# Patient Record
Sex: Male | Born: 1945 | Race: Black or African American | Hispanic: No | Marital: Single | Smoking: Former smoker
Health system: Southern US, Community
[De-identification: ages and names within clinical notes are randomized; demographics above are authoritative.]

## PROBLEM LIST (undated history)

## (undated) DIAGNOSIS — E119 Type 2 diabetes mellitus without complications: Secondary | ICD-10-CM

## (undated) DIAGNOSIS — M1990 Unspecified osteoarthritis, unspecified site: Secondary | ICD-10-CM

## (undated) DIAGNOSIS — Z8719 Personal history of other diseases of the digestive system: Secondary | ICD-10-CM

## (undated) DIAGNOSIS — W3400XA Accidental discharge from unspecified firearms or gun, initial encounter: Secondary | ICD-10-CM

## (undated) DIAGNOSIS — N411 Chronic prostatitis: Secondary | ICD-10-CM

## (undated) DIAGNOSIS — I10 Essential (primary) hypertension: Secondary | ICD-10-CM

## (undated) DIAGNOSIS — K5750 Diverticulosis of both small and large intestine without perforation or abscess without bleeding: Secondary | ICD-10-CM

## (undated) DIAGNOSIS — K219 Gastro-esophageal reflux disease without esophagitis: Secondary | ICD-10-CM

## (undated) DIAGNOSIS — A048 Other specified bacterial intestinal infections: Secondary | ICD-10-CM

## (undated) HISTORY — PX: OTHER SURGICAL HISTORY: SHX169

## (undated) HISTORY — DX: Essential (primary) hypertension: I10

## (undated) HISTORY — PX: BACK SURGERY: SHX140

## (undated) HISTORY — DX: Accidental discharge from unspecified firearms or gun, initial encounter: W34.00XA

## (undated) HISTORY — DX: Chronic prostatitis: N41.1

## (undated) HISTORY — DX: Diverticulosis of both small and large intestine without perforation or abscess without bleeding: K57.50

## (undated) HISTORY — PX: TONSILLECTOMY: SUR1361

## (undated) HISTORY — DX: Other specified bacterial intestinal infections: A04.8

## (undated) HISTORY — PX: LAMINECTOMY: SHX219

## (undated) HISTORY — DX: Type 2 diabetes mellitus without complications: E11.9

## (undated) HISTORY — DX: Unspecified osteoarthritis, unspecified site: M19.90

## (undated) HISTORY — PX: CHOLECYSTECTOMY OPEN: SUR202

## (undated) HISTORY — PX: HIATAL HERNIA REPAIR: SHX195

---
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
## 1998-03-10 ENCOUNTER — Ambulatory Visit (HOSPITAL_COMMUNITY): Admission: RE | Admit: 1998-03-10 | Discharge: 1998-03-10 | Payer: Self-pay | Admitting: Neurosurgery

## 1998-03-22 ENCOUNTER — Encounter: Admission: RE | Admit: 1998-03-22 | Discharge: 1998-03-22 | Payer: Self-pay | Admitting: Family Medicine

## 1998-05-05 ENCOUNTER — Encounter: Admission: RE | Admit: 1998-05-05 | Discharge: 1998-05-05 | Payer: Self-pay | Admitting: Family Medicine

## 1998-05-06 ENCOUNTER — Emergency Department (HOSPITAL_COMMUNITY): Admission: EM | Admit: 1998-05-06 | Discharge: 1998-05-06 | Payer: Self-pay | Admitting: Emergency Medicine

## 1998-05-19 ENCOUNTER — Encounter: Admission: RE | Admit: 1998-05-19 | Discharge: 1998-05-19 | Payer: Self-pay | Admitting: Family Medicine

## 1998-06-16 ENCOUNTER — Encounter: Admission: RE | Admit: 1998-06-16 | Discharge: 1998-06-16 | Payer: Self-pay | Admitting: Family Medicine

## 1998-06-30 ENCOUNTER — Encounter: Admission: RE | Admit: 1998-06-30 | Discharge: 1998-06-30 | Payer: Self-pay | Admitting: Family Medicine

## 1998-07-19 ENCOUNTER — Encounter: Admission: RE | Admit: 1998-07-19 | Discharge: 1998-07-19 | Payer: Self-pay | Admitting: Family Medicine

## 1998-08-24 ENCOUNTER — Encounter: Admission: RE | Admit: 1998-08-24 | Discharge: 1998-08-24 | Payer: Self-pay | Admitting: Family Medicine

## 1998-08-29 ENCOUNTER — Encounter: Admission: RE | Admit: 1998-08-29 | Discharge: 1998-08-29 | Payer: Self-pay | Admitting: Family Medicine

## 1998-09-25 ENCOUNTER — Emergency Department (HOSPITAL_COMMUNITY): Admission: EM | Admit: 1998-09-25 | Discharge: 1998-09-25 | Payer: Self-pay | Admitting: *Deleted

## 1998-09-29 ENCOUNTER — Encounter: Admission: RE | Admit: 1998-09-29 | Discharge: 1998-09-29 | Payer: Self-pay | Admitting: Family Medicine

## 1998-10-25 ENCOUNTER — Encounter: Admission: RE | Admit: 1998-10-25 | Discharge: 1998-10-25 | Payer: Self-pay | Admitting: Sports Medicine

## 1998-10-28 ENCOUNTER — Encounter: Admission: RE | Admit: 1998-10-28 | Discharge: 1998-10-28 | Payer: Self-pay | Admitting: Family Medicine

## 1998-12-06 ENCOUNTER — Ambulatory Visit (HOSPITAL_COMMUNITY): Admission: RE | Admit: 1998-12-06 | Discharge: 1998-12-06 | Payer: Self-pay | Admitting: *Deleted

## 1999-01-02 ENCOUNTER — Encounter: Admission: RE | Admit: 1999-01-02 | Discharge: 1999-01-02 | Payer: Self-pay | Admitting: Family Medicine

## 1999-01-02 ENCOUNTER — Ambulatory Visit (HOSPITAL_COMMUNITY): Admission: RE | Admit: 1999-01-02 | Discharge: 1999-01-02 | Payer: Self-pay | Admitting: *Deleted

## 1999-01-02 ENCOUNTER — Ambulatory Visit (HOSPITAL_COMMUNITY): Admission: RE | Admit: 1999-01-02 | Discharge: 1999-01-02 | Payer: Self-pay | Admitting: Family Medicine

## 1999-01-03 ENCOUNTER — Emergency Department (HOSPITAL_COMMUNITY): Admission: EM | Admit: 1999-01-03 | Discharge: 1999-01-03 | Payer: Self-pay | Admitting: Emergency Medicine

## 1999-02-28 ENCOUNTER — Encounter: Payer: Self-pay | Admitting: Neurosurgery

## 1999-02-28 ENCOUNTER — Ambulatory Visit (HOSPITAL_COMMUNITY): Admission: RE | Admit: 1999-02-28 | Discharge: 1999-02-28 | Payer: Self-pay | Admitting: Neurosurgery

## 1999-03-07 ENCOUNTER — Emergency Department (HOSPITAL_COMMUNITY): Admission: EM | Admit: 1999-03-07 | Discharge: 1999-03-07 | Payer: Self-pay | Admitting: Emergency Medicine

## 1999-03-07 ENCOUNTER — Encounter: Payer: Self-pay | Admitting: Emergency Medicine

## 1999-03-16 ENCOUNTER — Encounter: Admission: RE | Admit: 1999-03-16 | Discharge: 1999-03-16 | Payer: Self-pay | Admitting: Family Medicine

## 1999-04-03 ENCOUNTER — Encounter: Admission: RE | Admit: 1999-04-03 | Discharge: 1999-04-03 | Payer: Self-pay | Admitting: Family Medicine

## 1999-04-05 ENCOUNTER — Encounter: Admission: RE | Admit: 1999-04-05 | Discharge: 1999-04-05 | Payer: Self-pay | Admitting: Family Medicine

## 1999-04-06 ENCOUNTER — Encounter: Payer: Self-pay | Admitting: Emergency Medicine

## 1999-04-06 ENCOUNTER — Emergency Department (HOSPITAL_COMMUNITY): Admission: EM | Admit: 1999-04-06 | Discharge: 1999-04-06 | Payer: Self-pay | Admitting: Psychologist

## 1999-04-07 ENCOUNTER — Ambulatory Visit (HOSPITAL_COMMUNITY): Admission: RE | Admit: 1999-04-07 | Discharge: 1999-04-07 | Payer: Self-pay | Admitting: Emergency Medicine

## 1999-04-07 ENCOUNTER — Encounter: Payer: Self-pay | Admitting: Emergency Medicine

## 1999-04-19 ENCOUNTER — Encounter: Payer: Self-pay | Admitting: Neurosurgery

## 1999-04-21 ENCOUNTER — Inpatient Hospital Stay (HOSPITAL_COMMUNITY): Admission: RE | Admit: 1999-04-21 | Discharge: 1999-04-26 | Payer: Self-pay | Admitting: Neurosurgery

## 1999-04-21 ENCOUNTER — Encounter: Payer: Self-pay | Admitting: Neurosurgery

## 1999-05-29 ENCOUNTER — Encounter: Admission: RE | Admit: 1999-05-29 | Discharge: 1999-07-10 | Payer: Self-pay | Admitting: Neurosurgery

## 1999-06-15 ENCOUNTER — Encounter: Admission: RE | Admit: 1999-06-15 | Discharge: 1999-07-10 | Payer: Self-pay | Admitting: Orthopedic Surgery

## 1999-07-12 ENCOUNTER — Encounter: Admission: RE | Admit: 1999-07-12 | Discharge: 1999-10-10 | Payer: Self-pay | Admitting: Orthopedic Surgery

## 1999-07-12 ENCOUNTER — Encounter: Admission: RE | Admit: 1999-07-12 | Discharge: 1999-10-10 | Payer: Self-pay | Admitting: Neurosurgery

## 1999-07-21 ENCOUNTER — Encounter: Admission: RE | Admit: 1999-07-21 | Discharge: 1999-07-21 | Payer: Self-pay | Admitting: Family Medicine

## 1999-08-03 ENCOUNTER — Encounter: Payer: Self-pay | Admitting: Neurosurgery

## 1999-08-03 ENCOUNTER — Ambulatory Visit (HOSPITAL_COMMUNITY): Admission: RE | Admit: 1999-08-03 | Discharge: 1999-08-03 | Payer: Self-pay | Admitting: Neurosurgery

## 1999-08-11 ENCOUNTER — Encounter: Admission: RE | Admit: 1999-08-11 | Discharge: 1999-08-11 | Payer: Self-pay | Admitting: Family Medicine

## 1999-09-06 ENCOUNTER — Encounter: Admission: RE | Admit: 1999-09-06 | Discharge: 1999-09-06 | Payer: Self-pay | Admitting: Family Medicine

## 1999-09-18 ENCOUNTER — Encounter: Admission: RE | Admit: 1999-09-18 | Discharge: 1999-09-18 | Payer: Self-pay | Admitting: Family Medicine

## 1999-09-23 ENCOUNTER — Emergency Department (HOSPITAL_COMMUNITY): Admission: EM | Admit: 1999-09-23 | Discharge: 1999-09-23 | Payer: Self-pay | Admitting: Emergency Medicine

## 1999-09-25 ENCOUNTER — Encounter: Admission: RE | Admit: 1999-09-25 | Discharge: 1999-09-25 | Payer: Self-pay | Admitting: Family Medicine

## 1999-10-12 ENCOUNTER — Encounter: Admission: RE | Admit: 1999-10-12 | Discharge: 1999-10-12 | Payer: Self-pay | Admitting: Family Medicine

## 1999-11-15 ENCOUNTER — Encounter: Admission: RE | Admit: 1999-11-15 | Discharge: 1999-11-15 | Payer: Self-pay | Admitting: Family Medicine

## 1999-11-29 ENCOUNTER — Encounter: Admission: RE | Admit: 1999-11-29 | Discharge: 1999-11-29 | Payer: Self-pay | Admitting: Family Medicine

## 1999-12-18 ENCOUNTER — Encounter: Admission: RE | Admit: 1999-12-18 | Discharge: 1999-12-18 | Payer: Self-pay | Admitting: Family Medicine

## 1999-12-27 ENCOUNTER — Encounter: Admission: RE | Admit: 1999-12-27 | Discharge: 1999-12-27 | Payer: Self-pay | Admitting: Family Medicine

## 2000-01-10 ENCOUNTER — Ambulatory Visit (HOSPITAL_COMMUNITY): Admission: RE | Admit: 2000-01-10 | Discharge: 2000-01-10 | Payer: Self-pay

## 2000-01-10 ENCOUNTER — Encounter: Admission: RE | Admit: 2000-01-10 | Discharge: 2000-01-10 | Payer: Self-pay | Admitting: Family Medicine

## 2000-03-07 ENCOUNTER — Encounter: Admission: RE | Admit: 2000-03-07 | Discharge: 2000-03-07 | Payer: Self-pay | Admitting: Family Medicine

## 2000-04-03 ENCOUNTER — Encounter: Admission: RE | Admit: 2000-04-03 | Discharge: 2000-04-03 | Payer: Self-pay | Admitting: Family Medicine

## 2000-05-26 ENCOUNTER — Emergency Department (HOSPITAL_COMMUNITY): Admission: EM | Admit: 2000-05-26 | Discharge: 2000-05-27 | Payer: Self-pay | Admitting: Emergency Medicine

## 2000-06-10 ENCOUNTER — Encounter: Admission: RE | Admit: 2000-06-10 | Discharge: 2000-06-10 | Payer: Self-pay | Admitting: Family Medicine

## 2000-06-11 ENCOUNTER — Encounter: Admission: RE | Admit: 2000-06-11 | Discharge: 2000-06-11 | Payer: Self-pay

## 2000-07-12 ENCOUNTER — Encounter: Admission: RE | Admit: 2000-07-12 | Discharge: 2000-08-09 | Payer: Self-pay | Admitting: Orthopedic Surgery

## 2000-09-26 ENCOUNTER — Encounter: Admission: RE | Admit: 2000-09-26 | Discharge: 2000-09-26 | Payer: Self-pay | Admitting: Family Medicine

## 2000-10-03 ENCOUNTER — Encounter: Payer: Self-pay | Admitting: Emergency Medicine

## 2000-10-03 ENCOUNTER — Emergency Department (HOSPITAL_COMMUNITY): Admission: EM | Admit: 2000-10-03 | Discharge: 2000-10-06 | Payer: Self-pay | Admitting: Emergency Medicine

## 2000-10-14 ENCOUNTER — Encounter: Admission: RE | Admit: 2000-10-14 | Discharge: 2000-10-14 | Payer: Self-pay | Admitting: Family Medicine

## 2000-11-29 ENCOUNTER — Encounter: Payer: Self-pay | Admitting: Orthopedic Surgery

## 2000-11-29 ENCOUNTER — Ambulatory Visit (HOSPITAL_COMMUNITY): Admission: AD | Admit: 2000-11-29 | Discharge: 2000-11-29 | Payer: Self-pay | Admitting: Orthopedic Surgery

## 2001-01-21 ENCOUNTER — Emergency Department (HOSPITAL_COMMUNITY): Admission: EM | Admit: 2001-01-21 | Discharge: 2001-01-21 | Payer: Self-pay

## 2001-03-04 ENCOUNTER — Emergency Department (HOSPITAL_COMMUNITY): Admission: EM | Admit: 2001-03-04 | Discharge: 2001-03-04 | Payer: Self-pay | Admitting: *Deleted

## 2001-03-24 ENCOUNTER — Ambulatory Visit (HOSPITAL_BASED_OUTPATIENT_CLINIC_OR_DEPARTMENT_OTHER): Admission: RE | Admit: 2001-03-24 | Discharge: 2001-03-24 | Payer: Self-pay | Admitting: Orthopedic Surgery

## 2001-04-11 ENCOUNTER — Encounter: Admission: RE | Admit: 2001-04-11 | Discharge: 2001-05-01 | Payer: Self-pay | Admitting: Orthopedic Surgery

## 2001-06-04 ENCOUNTER — Encounter: Admission: RE | Admit: 2001-06-04 | Discharge: 2001-07-16 | Payer: Self-pay | Admitting: Orthopedic Surgery

## 2001-06-19 ENCOUNTER — Encounter: Admission: RE | Admit: 2001-06-19 | Discharge: 2001-06-19 | Payer: Self-pay | Admitting: Family Medicine

## 2001-06-26 ENCOUNTER — Encounter: Payer: Self-pay | Admitting: Orthopedic Surgery

## 2001-06-26 ENCOUNTER — Ambulatory Visit (HOSPITAL_COMMUNITY): Admission: RE | Admit: 2001-06-26 | Discharge: 2001-06-26 | Payer: Self-pay | Admitting: Orthopedic Surgery

## 2001-07-14 ENCOUNTER — Emergency Department (HOSPITAL_COMMUNITY): Admission: EM | Admit: 2001-07-14 | Discharge: 2001-07-14 | Payer: Self-pay | Admitting: Internal Medicine

## 2001-07-15 ENCOUNTER — Encounter: Admission: RE | Admit: 2001-07-15 | Discharge: 2001-07-15 | Payer: Self-pay | Admitting: Family Medicine

## 2001-08-06 ENCOUNTER — Encounter: Admission: RE | Admit: 2001-08-06 | Discharge: 2001-08-06 | Payer: Self-pay | Admitting: Family Medicine

## 2001-08-15 ENCOUNTER — Encounter: Payer: Self-pay | Admitting: Sports Medicine

## 2001-08-15 ENCOUNTER — Encounter: Admission: RE | Admit: 2001-08-15 | Discharge: 2001-08-15 | Payer: Self-pay | Admitting: Sports Medicine

## 2001-08-19 ENCOUNTER — Encounter: Admission: RE | Admit: 2001-08-19 | Discharge: 2001-08-19 | Payer: Self-pay | Admitting: Sports Medicine

## 2001-09-05 ENCOUNTER — Encounter: Payer: Self-pay | Admitting: Surgery

## 2001-09-12 ENCOUNTER — Inpatient Hospital Stay (HOSPITAL_COMMUNITY): Admission: RE | Admit: 2001-09-12 | Discharge: 2001-09-16 | Payer: Self-pay | Admitting: Surgery

## 2001-09-12 ENCOUNTER — Encounter (INDEPENDENT_AMBULATORY_CARE_PROVIDER_SITE_OTHER): Payer: Self-pay | Admitting: *Deleted

## 2001-10-08 ENCOUNTER — Encounter: Admission: RE | Admit: 2001-10-08 | Discharge: 2001-10-08 | Payer: Self-pay | Admitting: Family Medicine

## 2001-12-25 ENCOUNTER — Encounter: Admission: RE | Admit: 2001-12-25 | Discharge: 2001-12-25 | Payer: Self-pay | Admitting: Family Medicine

## 2002-01-17 ENCOUNTER — Emergency Department (HOSPITAL_COMMUNITY): Admission: EM | Admit: 2002-01-17 | Discharge: 2002-01-17 | Payer: Self-pay | Admitting: Emergency Medicine

## 2002-02-13 ENCOUNTER — Ambulatory Visit (HOSPITAL_COMMUNITY): Admission: RE | Admit: 2002-02-13 | Discharge: 2002-02-13 | Payer: Self-pay | Admitting: *Deleted

## 2002-02-17 ENCOUNTER — Encounter: Admission: RE | Admit: 2002-02-17 | Discharge: 2002-02-17 | Payer: Self-pay | Admitting: Family Medicine

## 2002-03-19 ENCOUNTER — Encounter: Admission: RE | Admit: 2002-03-19 | Discharge: 2002-03-19 | Payer: Self-pay | Admitting: Family Medicine

## 2002-04-20 ENCOUNTER — Encounter: Admission: RE | Admit: 2002-04-20 | Discharge: 2002-04-20 | Payer: Self-pay | Admitting: Family Medicine

## 2002-05-01 ENCOUNTER — Encounter: Admission: RE | Admit: 2002-05-01 | Discharge: 2002-05-01 | Payer: Self-pay | Admitting: Family Medicine

## 2002-08-19 ENCOUNTER — Encounter: Payer: Self-pay | Admitting: Orthopedic Surgery

## 2002-08-19 ENCOUNTER — Ambulatory Visit (HOSPITAL_COMMUNITY): Admission: RE | Admit: 2002-08-19 | Discharge: 2002-08-19 | Payer: Self-pay | Admitting: Orthopedic Surgery

## 2002-11-26 ENCOUNTER — Encounter: Admission: RE | Admit: 2002-11-26 | Discharge: 2002-11-26 | Payer: Self-pay | Admitting: Family Medicine

## 2003-02-02 ENCOUNTER — Emergency Department (HOSPITAL_COMMUNITY): Admission: EM | Admit: 2003-02-02 | Discharge: 2003-02-02 | Payer: Self-pay | Admitting: Emergency Medicine

## 2003-03-03 ENCOUNTER — Emergency Department (HOSPITAL_COMMUNITY): Admission: EM | Admit: 2003-03-03 | Discharge: 2003-03-03 | Payer: Self-pay | Admitting: Emergency Medicine

## 2003-05-20 ENCOUNTER — Emergency Department (HOSPITAL_COMMUNITY): Admission: EM | Admit: 2003-05-20 | Discharge: 2003-05-20 | Payer: Self-pay | Admitting: Emergency Medicine

## 2003-07-13 ENCOUNTER — Encounter: Admission: RE | Admit: 2003-07-13 | Discharge: 2003-07-13 | Payer: Self-pay | Admitting: Sports Medicine

## 2003-07-14 ENCOUNTER — Encounter: Admission: RE | Admit: 2003-07-14 | Discharge: 2003-07-14 | Payer: Self-pay | Admitting: Family Medicine

## 2003-07-30 ENCOUNTER — Emergency Department (HOSPITAL_COMMUNITY): Admission: EM | Admit: 2003-07-30 | Discharge: 2003-07-30 | Payer: Self-pay | Admitting: *Deleted

## 2003-08-11 ENCOUNTER — Ambulatory Visit (HOSPITAL_BASED_OUTPATIENT_CLINIC_OR_DEPARTMENT_OTHER): Admission: RE | Admit: 2003-08-11 | Discharge: 2003-08-11 | Payer: Self-pay | Admitting: Orthopedic Surgery

## 2003-11-22 ENCOUNTER — Emergency Department (HOSPITAL_COMMUNITY): Admission: EM | Admit: 2003-11-22 | Discharge: 2003-11-22 | Payer: Self-pay | Admitting: Emergency Medicine

## 2004-01-13 ENCOUNTER — Encounter: Admission: RE | Admit: 2004-01-13 | Discharge: 2004-01-13 | Payer: Self-pay | Admitting: *Deleted

## 2004-03-22 ENCOUNTER — Emergency Department (HOSPITAL_COMMUNITY): Admission: EM | Admit: 2004-03-22 | Discharge: 2004-03-22 | Payer: Self-pay | Admitting: Emergency Medicine

## 2004-04-14 ENCOUNTER — Encounter: Admission: RE | Admit: 2004-04-14 | Discharge: 2004-04-14 | Payer: Self-pay | Admitting: *Deleted

## 2004-05-06 ENCOUNTER — Emergency Department (HOSPITAL_COMMUNITY): Admission: EM | Admit: 2004-05-06 | Discharge: 2004-05-06 | Payer: Self-pay | Admitting: Emergency Medicine

## 2004-09-13 ENCOUNTER — Emergency Department (HOSPITAL_COMMUNITY): Admission: EM | Admit: 2004-09-13 | Discharge: 2004-09-13 | Payer: Self-pay | Admitting: Emergency Medicine

## 2004-09-18 ENCOUNTER — Ambulatory Visit: Payer: Self-pay | Admitting: Sports Medicine

## 2004-10-26 ENCOUNTER — Ambulatory Visit: Payer: Self-pay | Admitting: Sports Medicine

## 2004-12-14 ENCOUNTER — Emergency Department (HOSPITAL_COMMUNITY): Admission: EM | Admit: 2004-12-14 | Discharge: 2004-12-14 | Payer: Self-pay | Admitting: Emergency Medicine

## 2005-01-02 ENCOUNTER — Emergency Department (HOSPITAL_COMMUNITY): Admission: EM | Admit: 2005-01-02 | Discharge: 2005-01-02 | Payer: Self-pay | Admitting: Emergency Medicine

## 2005-01-24 ENCOUNTER — Emergency Department (HOSPITAL_COMMUNITY): Admission: EM | Admit: 2005-01-24 | Discharge: 2005-01-24 | Payer: Self-pay | Admitting: Emergency Medicine

## 2005-03-06 ENCOUNTER — Emergency Department (HOSPITAL_COMMUNITY): Admission: EM | Admit: 2005-03-06 | Discharge: 2005-03-06 | Payer: Self-pay | Admitting: Emergency Medicine

## 2005-05-16 ENCOUNTER — Emergency Department (HOSPITAL_COMMUNITY): Admission: EM | Admit: 2005-05-16 | Discharge: 2005-05-16 | Payer: Self-pay | Admitting: Emergency Medicine

## 2005-07-23 ENCOUNTER — Emergency Department (HOSPITAL_COMMUNITY): Admission: EM | Admit: 2005-07-23 | Discharge: 2005-07-23 | Payer: Self-pay | Admitting: Emergency Medicine

## 2005-07-26 ENCOUNTER — Ambulatory Visit: Payer: Self-pay | Admitting: Family Medicine

## 2005-08-17 ENCOUNTER — Ambulatory Visit: Payer: Self-pay | Admitting: Family Medicine

## 2005-08-17 ENCOUNTER — Emergency Department (HOSPITAL_COMMUNITY): Admission: EM | Admit: 2005-08-17 | Discharge: 2005-08-17 | Payer: Self-pay | Admitting: Emergency Medicine

## 2005-09-27 ENCOUNTER — Ambulatory Visit: Payer: Self-pay | Admitting: Family Medicine

## 2006-01-09 ENCOUNTER — Emergency Department (HOSPITAL_COMMUNITY): Admission: EM | Admit: 2006-01-09 | Discharge: 2006-01-09 | Payer: Self-pay | Admitting: Emergency Medicine

## 2006-04-08 ENCOUNTER — Emergency Department (HOSPITAL_COMMUNITY): Admission: EM | Admit: 2006-04-08 | Discharge: 2006-04-08 | Payer: Self-pay | Admitting: Emergency Medicine

## 2006-04-23 ENCOUNTER — Ambulatory Visit (HOSPITAL_COMMUNITY): Admission: RE | Admit: 2006-04-23 | Discharge: 2006-04-23 | Payer: Self-pay | Admitting: Gastroenterology

## 2006-05-14 ENCOUNTER — Emergency Department (HOSPITAL_COMMUNITY): Admission: EM | Admit: 2006-05-14 | Discharge: 2006-05-14 | Payer: Self-pay | Admitting: Emergency Medicine

## 2006-06-26 ENCOUNTER — Ambulatory Visit (HOSPITAL_COMMUNITY): Admission: RE | Admit: 2006-06-26 | Discharge: 2006-06-26 | Payer: Self-pay | Admitting: Gastroenterology

## 2006-09-02 ENCOUNTER — Ambulatory Visit (HOSPITAL_COMMUNITY): Admission: RE | Admit: 2006-09-02 | Discharge: 2006-09-02 | Payer: Self-pay | Admitting: Gastroenterology

## 2006-10-02 ENCOUNTER — Emergency Department (HOSPITAL_COMMUNITY): Admission: EM | Admit: 2006-10-02 | Discharge: 2006-10-02 | Payer: Self-pay | Admitting: Emergency Medicine

## 2006-10-16 ENCOUNTER — Ambulatory Visit: Payer: Self-pay | Admitting: Family Medicine

## 2007-02-06 DIAGNOSIS — F528 Other sexual dysfunction not due to a substance or known physiological condition: Secondary | ICD-10-CM | POA: Insufficient documentation

## 2007-02-06 DIAGNOSIS — Z716 Tobacco abuse counseling: Secondary | ICD-10-CM | POA: Insufficient documentation

## 2007-02-06 DIAGNOSIS — J309 Allergic rhinitis, unspecified: Secondary | ICD-10-CM | POA: Insufficient documentation

## 2007-02-06 DIAGNOSIS — I10 Essential (primary) hypertension: Secondary | ICD-10-CM | POA: Insufficient documentation

## 2007-02-06 DIAGNOSIS — E669 Obesity, unspecified: Secondary | ICD-10-CM | POA: Insufficient documentation

## 2007-02-06 DIAGNOSIS — M1990 Unspecified osteoarthritis, unspecified site: Secondary | ICD-10-CM | POA: Insufficient documentation

## 2007-02-06 DIAGNOSIS — F411 Generalized anxiety disorder: Secondary | ICD-10-CM | POA: Insufficient documentation

## 2007-02-06 DIAGNOSIS — N411 Chronic prostatitis: Secondary | ICD-10-CM | POA: Insufficient documentation

## 2007-02-06 DIAGNOSIS — N400 Enlarged prostate without lower urinary tract symptoms: Secondary | ICD-10-CM | POA: Insufficient documentation

## 2007-02-06 DIAGNOSIS — F339 Major depressive disorder, recurrent, unspecified: Secondary | ICD-10-CM | POA: Insufficient documentation

## 2007-03-10 ENCOUNTER — Emergency Department (HOSPITAL_COMMUNITY): Admission: EM | Admit: 2007-03-10 | Discharge: 2007-03-10 | Payer: Self-pay | Admitting: Emergency Medicine

## 2007-03-24 ENCOUNTER — Telehealth: Payer: Self-pay | Admitting: *Deleted

## 2007-03-26 ENCOUNTER — Ambulatory Visit: Payer: Self-pay | Admitting: Family Medicine

## 2007-03-26 ENCOUNTER — Encounter: Payer: Self-pay | Admitting: Family Medicine

## 2007-03-26 DIAGNOSIS — A5903 Trichomonal cystitis and urethritis: Secondary | ICD-10-CM | POA: Insufficient documentation

## 2007-03-26 LAB — CONVERTED CEMR LAB
Chlamydia, DNA Probe: NEGATIVE
GC Probe Amp, Genital: NEGATIVE

## 2007-04-03 ENCOUNTER — Ambulatory Visit: Payer: Self-pay | Admitting: Sports Medicine

## 2007-04-30 ENCOUNTER — Encounter: Payer: Self-pay | Admitting: *Deleted

## 2007-06-10 ENCOUNTER — Emergency Department (HOSPITAL_COMMUNITY): Admission: EM | Admit: 2007-06-10 | Discharge: 2007-06-10 | Payer: Self-pay | Admitting: Emergency Medicine

## 2007-07-19 ENCOUNTER — Emergency Department (HOSPITAL_COMMUNITY): Admission: EM | Admit: 2007-07-19 | Discharge: 2007-07-19 | Payer: Self-pay | Admitting: Emergency Medicine

## 2007-07-23 ENCOUNTER — Ambulatory Visit: Payer: Self-pay | Admitting: Family Medicine

## 2007-07-23 ENCOUNTER — Encounter (INDEPENDENT_AMBULATORY_CARE_PROVIDER_SITE_OTHER): Payer: Self-pay | Admitting: *Deleted

## 2007-07-23 DIAGNOSIS — K219 Gastro-esophageal reflux disease without esophagitis: Secondary | ICD-10-CM | POA: Insufficient documentation

## 2007-07-23 DIAGNOSIS — E119 Type 2 diabetes mellitus without complications: Secondary | ICD-10-CM | POA: Insufficient documentation

## 2007-07-23 LAB — CONVERTED CEMR LAB: Hgb A1c MFr Bld: 9.2 %

## 2007-07-24 ENCOUNTER — Telehealth: Payer: Self-pay | Admitting: *Deleted

## 2007-07-24 LAB — CONVERTED CEMR LAB
ALT: 53 units/L (ref 0–53)
BUN: 13 mg/dL (ref 6–23)
CO2: 26 meq/L (ref 19–32)
Calcium: 9.8 mg/dL (ref 8.4–10.5)
Chloride: 100 meq/L (ref 96–112)
Creatinine, Ser: 1.11 mg/dL (ref 0.40–1.50)
Direct LDL: 98 mg/dL
Glucose, Bld: 191 mg/dL — ABNORMAL HIGH (ref 70–99)
Total Bilirubin: 0.6 mg/dL (ref 0.3–1.2)

## 2007-08-05 ENCOUNTER — Encounter: Admission: RE | Admit: 2007-08-05 | Discharge: 2007-08-05 | Payer: Self-pay

## 2007-08-05 ENCOUNTER — Encounter (INDEPENDENT_AMBULATORY_CARE_PROVIDER_SITE_OTHER): Payer: Self-pay | Admitting: *Deleted

## 2007-08-14 ENCOUNTER — Encounter (INDEPENDENT_AMBULATORY_CARE_PROVIDER_SITE_OTHER): Payer: Self-pay | Admitting: *Deleted

## 2007-08-20 ENCOUNTER — Ambulatory Visit: Payer: Self-pay | Admitting: Family Medicine

## 2007-09-17 ENCOUNTER — Ambulatory Visit: Payer: Self-pay | Admitting: Family Medicine

## 2007-09-17 LAB — CONVERTED CEMR LAB: Hgb A1c MFr Bld: 6.9 %

## 2007-09-30 ENCOUNTER — Encounter (INDEPENDENT_AMBULATORY_CARE_PROVIDER_SITE_OTHER): Payer: Self-pay | Admitting: *Deleted

## 2007-10-13 ENCOUNTER — Encounter (INDEPENDENT_AMBULATORY_CARE_PROVIDER_SITE_OTHER): Payer: Self-pay | Admitting: *Deleted

## 2007-10-13 ENCOUNTER — Ambulatory Visit: Payer: Self-pay | Admitting: Family Medicine

## 2007-10-14 LAB — CONVERTED CEMR LAB
BUN: 15 mg/dL (ref 6–23)
Cholesterol: 149 mg/dL (ref 0–200)
HDL: 34 mg/dL — ABNORMAL LOW (ref >39)
Potassium: 4.8 meq/L (ref 3.5–5.3)
Triglycerides: 160 mg/dL — ABNORMAL HIGH (ref <150)
VLDL: 32 mg/dL (ref 0–40)

## 2007-10-20 ENCOUNTER — Ambulatory Visit: Payer: Self-pay | Admitting: Family Medicine

## 2007-10-20 DIAGNOSIS — E785 Hyperlipidemia, unspecified: Secondary | ICD-10-CM | POA: Insufficient documentation

## 2007-10-20 LAB — CONVERTED CEMR LAB
Bilirubin Urine: NEGATIVE
Ketones, urine, test strip: NEGATIVE
Nitrite: NEGATIVE
Protein, U semiquant: NEGATIVE
Urobilinogen, UA: 1

## 2007-10-21 ENCOUNTER — Encounter (INDEPENDENT_AMBULATORY_CARE_PROVIDER_SITE_OTHER): Payer: Self-pay | Admitting: *Deleted

## 2008-02-12 ENCOUNTER — Ambulatory Visit: Payer: Self-pay | Admitting: Family Medicine

## 2008-02-12 LAB — CONVERTED CEMR LAB: Hgb A1c MFr Bld: 6.7 %

## 2008-02-27 ENCOUNTER — Encounter: Payer: Self-pay | Admitting: *Deleted

## 2008-03-03 ENCOUNTER — Ambulatory Visit: Payer: Self-pay | Admitting: Family Medicine

## 2008-03-09 ENCOUNTER — Ambulatory Visit: Payer: Self-pay | Admitting: Family Medicine

## 2008-03-09 ENCOUNTER — Encounter (INDEPENDENT_AMBULATORY_CARE_PROVIDER_SITE_OTHER): Payer: Self-pay | Admitting: *Deleted

## 2008-03-09 LAB — CONVERTED CEMR LAB
Direct LDL: 92 mg/dL
Nitrite: NEGATIVE
Specific Gravity, Urine: 1.025
WBC Urine, dipstick: NEGATIVE

## 2008-03-10 ENCOUNTER — Encounter (INDEPENDENT_AMBULATORY_CARE_PROVIDER_SITE_OTHER): Payer: Self-pay | Admitting: *Deleted

## 2008-03-16 ENCOUNTER — Encounter (INDEPENDENT_AMBULATORY_CARE_PROVIDER_SITE_OTHER): Payer: Self-pay | Admitting: *Deleted

## 2008-03-16 ENCOUNTER — Ambulatory Visit: Payer: Self-pay | Admitting: Family Medicine

## 2008-03-16 DIAGNOSIS — R195 Other fecal abnormalities: Secondary | ICD-10-CM | POA: Insufficient documentation

## 2008-03-17 ENCOUNTER — Encounter (INDEPENDENT_AMBULATORY_CARE_PROVIDER_SITE_OTHER): Payer: Self-pay | Admitting: *Deleted

## 2008-03-17 LAB — CONVERTED CEMR LAB
ALT: 14 units/L (ref 0–53)
AST: 14 units/L (ref 0–37)
Albumin: 4.7 g/dL (ref 3.5–5.2)
BUN: 16 mg/dL (ref 6–23)
CO2: 25 meq/L (ref 19–32)
Calcium: 9.2 mg/dL (ref 8.4–10.5)
Chloride: 101 meq/L (ref 96–112)
Creatinine, Ser: 1.03 mg/dL (ref 0.40–1.50)
Hemoglobin: 16 g/dL (ref 13.0–17.0)
Lipase: 29 units/L (ref 0–75)
Lymphocytes Relative: 36 % (ref 12–46)
Monocytes Absolute: 0.4 10*3/uL (ref 0.1–1.0)
Monocytes Relative: 6 % (ref 3–12)
Neutro Abs: 2.5 10*3/uL (ref 1.7–7.7)
Neutrophils Relative %: 42 % — ABNORMAL LOW (ref 43–77)
Potassium: 4.6 meq/L (ref 3.5–5.3)
RBC: 5.53 M/uL (ref 4.22–5.81)
WBC: 6 10*3/uL (ref 4.0–10.5)

## 2008-03-25 ENCOUNTER — Encounter (INDEPENDENT_AMBULATORY_CARE_PROVIDER_SITE_OTHER): Payer: Self-pay | Admitting: *Deleted

## 2008-04-01 ENCOUNTER — Encounter (INDEPENDENT_AMBULATORY_CARE_PROVIDER_SITE_OTHER): Payer: Self-pay | Admitting: *Deleted

## 2008-04-05 ENCOUNTER — Encounter: Payer: Self-pay | Admitting: *Deleted

## 2008-05-15 ENCOUNTER — Emergency Department (HOSPITAL_COMMUNITY): Admission: EM | Admit: 2008-05-15 | Discharge: 2008-05-15 | Payer: Self-pay | Admitting: Emergency Medicine

## 2008-05-17 ENCOUNTER — Encounter (INDEPENDENT_AMBULATORY_CARE_PROVIDER_SITE_OTHER): Payer: Self-pay | Admitting: *Deleted

## 2008-06-21 ENCOUNTER — Telehealth: Payer: Self-pay | Admitting: *Deleted

## 2008-07-15 ENCOUNTER — Emergency Department (HOSPITAL_COMMUNITY): Admission: EM | Admit: 2008-07-15 | Discharge: 2008-07-15 | Payer: Self-pay | Admitting: *Deleted

## 2008-07-15 ENCOUNTER — Ambulatory Visit: Payer: Self-pay | Admitting: Family Medicine

## 2008-09-30 ENCOUNTER — Emergency Department (HOSPITAL_COMMUNITY): Admission: EM | Admit: 2008-09-30 | Discharge: 2008-09-30 | Payer: Self-pay | Admitting: Emergency Medicine

## 2008-11-15 ENCOUNTER — Ambulatory Visit: Payer: Self-pay | Admitting: Family Medicine

## 2008-11-15 ENCOUNTER — Encounter: Payer: Self-pay | Admitting: Family Medicine

## 2008-11-28 ENCOUNTER — Emergency Department (HOSPITAL_COMMUNITY): Admission: EM | Admit: 2008-11-28 | Discharge: 2008-11-28 | Payer: Self-pay | Admitting: Emergency Medicine

## 2008-12-27 ENCOUNTER — Ambulatory Visit: Payer: Self-pay | Admitting: Family Medicine

## 2008-12-27 LAB — CONVERTED CEMR LAB: Hgb A1c MFr Bld: 7.1 %

## 2009-03-04 ENCOUNTER — Emergency Department (HOSPITAL_COMMUNITY): Admission: EM | Admit: 2009-03-04 | Discharge: 2009-03-04 | Payer: Self-pay | Admitting: Emergency Medicine

## 2009-05-19 ENCOUNTER — Telehealth: Payer: Self-pay | Admitting: *Deleted

## 2009-06-06 ENCOUNTER — Ambulatory Visit: Payer: Self-pay | Admitting: Family Medicine

## 2009-06-06 ENCOUNTER — Encounter: Payer: Self-pay | Admitting: Family Medicine

## 2009-06-06 LAB — CONVERTED CEMR LAB: Hgb A1c MFr Bld: 7.2 %

## 2009-06-07 DIAGNOSIS — R946 Abnormal results of thyroid function studies: Secondary | ICD-10-CM | POA: Insufficient documentation

## 2009-06-07 LAB — CONVERTED CEMR LAB
ALT: 15 units/L (ref 0–53)
BUN: 12 mg/dL (ref 6–23)
CO2: 25 meq/L (ref 19–32)
Calcium: 10.3 mg/dL (ref 8.4–10.5)
Chloride: 101 meq/L (ref 96–112)
Creatinine, Ser: 1.09 mg/dL (ref 0.40–1.50)
TSH: 4.743 microintl units/mL — ABNORMAL HIGH (ref 0.350–4.500)
Total Bilirubin: 0.5 mg/dL (ref 0.3–1.2)

## 2009-06-08 ENCOUNTER — Encounter: Payer: Self-pay | Admitting: Family Medicine

## 2009-06-08 ENCOUNTER — Ambulatory Visit: Payer: Self-pay | Admitting: Family Medicine

## 2009-06-08 LAB — CONVERTED CEMR LAB
Free T4: 1.03 ng/dL (ref 0.80–1.80)
PSA: 1.27 ng/mL (ref 0.10–4.00)
T3, Free: 3.3 pg/mL (ref 2.3–4.2)

## 2009-06-09 ENCOUNTER — Encounter: Payer: Self-pay | Admitting: Family Medicine

## 2009-06-15 ENCOUNTER — Telehealth: Payer: Self-pay | Admitting: *Deleted

## 2009-08-05 ENCOUNTER — Encounter: Payer: Self-pay | Admitting: Family Medicine

## 2009-08-05 ENCOUNTER — Emergency Department (HOSPITAL_COMMUNITY): Admission: EM | Admit: 2009-08-05 | Discharge: 2009-08-05 | Payer: Self-pay | Admitting: Emergency Medicine

## 2009-08-05 ENCOUNTER — Ambulatory Visit: Payer: Self-pay | Admitting: Family Medicine

## 2009-08-31 ENCOUNTER — Emergency Department (HOSPITAL_COMMUNITY): Admission: EM | Admit: 2009-08-31 | Discharge: 2009-08-31 | Payer: Self-pay | Admitting: Emergency Medicine

## 2009-09-20 ENCOUNTER — Ambulatory Visit: Payer: Self-pay | Admitting: Family Medicine

## 2009-09-20 ENCOUNTER — Encounter: Payer: Self-pay | Admitting: Psychology

## 2009-09-20 DIAGNOSIS — R109 Unspecified abdominal pain: Secondary | ICD-10-CM | POA: Insufficient documentation

## 2010-01-11 ENCOUNTER — Ambulatory Visit: Payer: Self-pay | Admitting: Family Medicine

## 2010-02-27 ENCOUNTER — Emergency Department (HOSPITAL_COMMUNITY): Admission: EM | Admit: 2010-02-27 | Discharge: 2010-02-27 | Payer: Self-pay | Admitting: Emergency Medicine

## 2010-03-03 ENCOUNTER — Telehealth: Payer: Self-pay | Admitting: Family Medicine

## 2010-03-24 ENCOUNTER — Ambulatory Visit: Payer: Self-pay | Admitting: Family Medicine

## 2010-04-19 ENCOUNTER — Ambulatory Visit: Payer: Self-pay | Admitting: Family Medicine

## 2010-04-19 DIAGNOSIS — M25519 Pain in unspecified shoulder: Secondary | ICD-10-CM | POA: Insufficient documentation

## 2010-05-02 ENCOUNTER — Encounter: Admission: RE | Admit: 2010-05-02 | Discharge: 2010-06-26 | Payer: Self-pay | Admitting: Family Medicine

## 2010-05-23 ENCOUNTER — Ambulatory Visit: Payer: Self-pay | Admitting: Family Medicine

## 2010-06-09 ENCOUNTER — Encounter: Payer: Self-pay | Admitting: Family Medicine

## 2010-06-22 ENCOUNTER — Encounter: Payer: Self-pay | Admitting: Family Medicine

## 2010-07-21 ENCOUNTER — Ambulatory Visit: Payer: Self-pay | Admitting: Family Medicine

## 2010-07-21 ENCOUNTER — Encounter: Payer: Self-pay | Admitting: Family Medicine

## 2010-07-21 DIAGNOSIS — N400 Enlarged prostate without lower urinary tract symptoms: Secondary | ICD-10-CM | POA: Insufficient documentation

## 2010-07-21 LAB — CONVERTED CEMR LAB
ALT: 14 units/L (ref 0–53)
BUN: 11 mg/dL (ref 6–23)
CO2: 28 meq/L (ref 19–32)
Calcium: 9.9 mg/dL (ref 8.4–10.5)
Chloride: 101 meq/L (ref 96–112)
Cholesterol: 168 mg/dL (ref 0–200)
Creatinine, Ser: 0.94 mg/dL (ref 0.40–1.50)
Glucose, Bld: 134 mg/dL — ABNORMAL HIGH (ref 70–99)
HCT: 46.6 % (ref 39.0–52.0)
Hemoglobin: 16.1 g/dL (ref 13.0–17.0)
MCV: 85.2 fL (ref 78.0–100.0)
RBC: 5.47 M/uL (ref 4.22–5.81)
Total Bilirubin: 0.6 mg/dL (ref 0.3–1.2)
Total CHOL/HDL Ratio: 4.5
Triglycerides: 230 mg/dL — ABNORMAL HIGH (ref <150)
VLDL: 46 mg/dL — ABNORMAL HIGH (ref 0–40)
WBC: 5.1 10*3/uL (ref 4.0–10.5)

## 2010-07-24 ENCOUNTER — Encounter: Payer: Self-pay | Admitting: Family Medicine

## 2010-09-26 ENCOUNTER — Ambulatory Visit: Payer: Self-pay | Admitting: Family Medicine

## 2010-09-26 LAB — CONVERTED CEMR LAB: H Pylori IgG: POSITIVE

## 2010-11-14 ENCOUNTER — Ambulatory Visit: Payer: Self-pay | Admitting: Family Medicine

## 2010-11-14 ENCOUNTER — Encounter: Payer: Self-pay | Admitting: Family Medicine

## 2010-11-14 DIAGNOSIS — N529 Male erectile dysfunction, unspecified: Secondary | ICD-10-CM | POA: Insufficient documentation

## 2010-11-15 ENCOUNTER — Encounter: Payer: Self-pay | Admitting: Family Medicine

## 2010-11-24 ENCOUNTER — Emergency Department (HOSPITAL_COMMUNITY)
Admission: EM | Admit: 2010-11-24 | Discharge: 2010-11-24 | Payer: Self-pay | Source: Home / Self Care | Admitting: Emergency Medicine

## 2010-12-03 ENCOUNTER — Emergency Department (HOSPITAL_COMMUNITY)
Admission: EM | Admit: 2010-12-03 | Discharge: 2010-12-03 | Payer: Self-pay | Source: Home / Self Care | Admitting: Emergency Medicine

## 2010-12-05 ENCOUNTER — Ambulatory Visit: Payer: Self-pay | Admitting: Family Medicine

## 2010-12-05 ENCOUNTER — Telehealth: Payer: Self-pay | Admitting: Family Medicine

## 2011-01-11 NOTE — Miscellaneous (Signed)
Summary: form to continue PT  Clinical Lists Changes form to pcp to sign, allowing 4 more wks of PT for his shoulder.[place] Circle RN  June 09, 2010 2:33 PM

## 2011-01-11 NOTE — Assessment & Plan Note (Signed)
Summary: f/u htn/dm/gerd   Vital Signs:  Patient profile:   65 year old male Height:      72.5 inches Weight:      253 pounds BMI:     33.96 Temp:     98.5 degrees F oral Pulse rate:   82 / minute BP sitting:   168 / 110  (left arm) Cuff size:   large  Vitals Entered By: Tessie Fass CMA (November 14, 2010 1:57 PM) CC: F/U Is Patient Diabetic? Yes   Primary Alysandra Lobue:  Everrett Coombe DO  CC:  F/U.  History of Present Illness: Pt. here to f/u on 1.DMII- Does not check sugars that often at home.  Currently no problems with his current medication regimen.  States he tries to avoid foods that he knows make his sugar run high.  No episodes of low blood sugar or shakiness. 2.  Reflux- Pt. states he completed treatmen for H. Pylori.  Still having quite a bit of reflux, states it burns like fire when he is laying down.  No certain foods that he can narrow down that make condition worse.  Still doubling up on PPI daily.  Denies any blood in his stool or changes in bowel habits.  No associated nausea, vomiting. 3.Hypertension- BP not well controlled over the past few office visits.  Pt. states he has not made the change in BP medication that was prescribed to him last time.  No episodes of chest pain, shortness of breath, headache. 4.Impotence- Patient with difficulty achieving erection and also with decreased energy.  Would like to have his testosterone checked.  Does use viagra occasionally, which works.    Current Medications (verified): 1)  Cardura 2 Mg  Tabs (Doxazosin Mesylate) .Marland Kitchen.. 1 By Mouth Once Daily, For Bph 2)  Viagra 100 Mg Tabs (Sildenafil Citrate) .... Take One Tablet 30 Min Prior To Relations 3)  Zyrtec Allergy 10 Mg Tabs (Cetirizine Hcl) .... Take 1 Tablet By Mouth Once A Day As Needed 4)  Nexium 40 Mg Cpdr (Esomeprazole Magnesium) .Marland Kitchen.. 1 Tab Two Times A Day 5)  Metformin Hcl 1000 Mg Tabs (Metformin Hcl) .... Take 1 Tablet By Mouth Two Times A Day 6)  Aspirin Ec 81 Mg  Tbec  (Aspirin) .Marland Kitchen.. 1 By Mouth Qday 7)  Flonase 50 Mcg/act  Susp (Fluticasone Propionate) .... 2 Sprays Per Nostril Once Daily 8)  Metoprolol Tartrate 100 Mg Tabs (Metoprolol Tartrate) .Marland Kitchen.. 1 1/2  Tab By Mouth Two Times A Day 9)  Hydrochlorothiazide 25 Mg Tabs (Hydrochlorothiazide) .... Take One By Mouth Daily, Water Pill For Bp 10)  Amlodipine Besylate 10 Mg Tabs (Amlodipine Besylate) .... Take One Daily For Blood Pressure 11)  Truetrack Test  Strp (Glucose Blood) .... Check Once Daily 12)  Miralax  Powd (Polyethylene Glycol 3350) .Marland Kitchen.. 17gm in 8oz Fluid Daily As Needed For Constipation 13)  Colace 100 Mg Caps (Docusate Sodium) .... Take One By Mouth Daily As Needed Constipation 14)  Flexeril 10 Mg Tabs (Cyclobenzaprine Hcl) .... Take One By Mouth Two Times A Day For Muscle Aches 15)  Tramadol Hcl 50 Mg Tabs (Tramadol Hcl) .... One By Mouth Two Times A Day As Needed Pain 16)  Zostavax 16109 Unt/0.49ml Solr (Zoster Vaccine Live) .... Take X 1 17)  Clarithromycin 500 Mg Tabs (Clarithromycin) .Marland Kitchen.. 1 Tab By Mouth Two Times A Day 18)  Reglan 10 Mg Tabs (Metoclopramide Hcl) .Marland Kitchen.. 1 Tab By Mouth 30 Minutes Before Meals and Before Bedtime  Allergies (  verified): 1)  ! * Enalapril  Review of Systems       Pertinent positives and negatives noted in HPI, Vitals signs noted   Physical Exam  General:  Well-developed,well-nourished,in no acute distress; alert,appropriate and cooperative throughout examination Mouth:  No signs of erythema or irritation from chronic reflux Lungs:  Normal respiratory effort, chest expands symmetrically. Lungs are clear to auscultation, no crackles or wheezes. Heart:  Normal rate and regular rhythm. S1 and S2 normal without gallop, murmur, click, rub or other extra sounds. Abdomen:  Mild epigastric tenderness, normal bowel sounds, no distention, no guarding, and no rebound tenderness.     Impression & Recommendations:  Problem # 1:  DIABETES-TYPE 2 (ICD-250.00) Diabetes  seems to be doing well, will check A1c today to assess level of glycemic control.  Encouraged pt. to check his sugars to know what they are running on a consistent basis.  Also encouraged to continue to avoid foods that may make his sugar be elevated His updated medication list for this problem includes:    Metformin Hcl 1000 Mg Tabs (Metformin hcl) .Marland Kitchen... Take 1 tablet by mouth two times a day    Aspirin Ec 81 Mg Tbec (Aspirin) .Marland Kitchen... 1 by mouth qday  Orders: A1C-[facility] (04540) [facility]- Est  Level 4 (98119)  Problem # 2:  GERD (ICD-530.81) Assessment: Deteriorated Patient still with significant reflux even after treatment for H. pylori.  Will continue him on PPI two times a day and give him a trial of reglan since I think he may have an element of gastroparesis associated with his diabetes.  If still not improving will refer to GI for possible endoscopy.   His updated medication list for this problem includes:    Nexium 40 Mg Cpdr (Esomeprazole magnesium) .Marland Kitchen... 1 tab two times a day  Orders: [facility]- Est  Level 4 (14782)  Problem # 3:  HYPERTENSION, BENIGN SYSTEMIC (ICD-401.1) Assessment: Unchanged Patient never picked up the new prescription for metoprolol after last visit.  Will have him try this new dose and see if his pressures improve over the next couple of weeks.  Also encouraged to discontinue smoking as this is likely contributing to his hypertension and increases his risk for heart disease His updated medication list for this problem includes:    Cardura 2 Mg Tabs (Doxazosin mesylate) .Marland Kitchen... 1 by mouth once daily, for bph    Metoprolol Tartrate 100 Mg Tabs (Metoprolol tartrate) .Marland Kitchen... 1 1/2  tab by mouth two times a day    Hydrochlorothiazide 25 Mg Tabs (Hydrochlorothiazide) .Marland Kitchen... Take one by mouth daily, water pill for bp    Amlodipine Besylate 10 Mg Tabs (Amlodipine besylate) .Marland Kitchen... 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 with difficulty with erection and low energy.  Other causes of impotence may be 2/2 to prostate issues or current medications.  His updated medication list for this problem includes:    Viagra 100 Mg Tabs (Sildenafil citrate) .Marland Kitchen... Take one tablet 30 min prior to relations  Orders: Testosterone-[facility] (69629-52841) [facility]- Est  Level 4 (32440)  Complete Medication List: 1)  Cardura 2 Mg Tabs (Doxazosin mesylate) .Marland Kitchen.. 1 by mouth once daily, for bph 2)  Viagra 100 Mg Tabs (Sildenafil citrate) .... Take one tablet 30 min prior to relations 3)  Zyrtec Allergy 10 Mg Tabs (Cetirizine hcl) .... Take 1 tablet by  mouth once a day as needed 4)  Nexium 40 Mg Cpdr (Esomeprazole magnesium) .Marland Kitchen.. 1 tab two times a day 5)  Metformin Hcl 1000 Mg Tabs (Metformin hcl) .... Take 1 tablet by mouth two times a day 6)  Aspirin Ec 81 Mg Tbec (Aspirin) .Marland Kitchen.. 1 by mouth qday 7)  Flonase 50 Mcg/act Susp (Fluticasone propionate) .... 2 sprays per nostril once daily 8)  Metoprolol Tartrate 100 Mg Tabs (Metoprolol tartrate) .Marland Kitchen.. 1 1/2  tab by mouth two times a day 9)  Hydrochlorothiazide 25 Mg Tabs (Hydrochlorothiazide) .... Take one by mouth daily, water pill for bp 10)  Amlodipine Besylate 10 Mg Tabs (Amlodipine besylate) .... Take one daily for blood pressure 11)  Truetrack Test Strp (Glucose blood) .... Check once daily 12)  Miralax Powd (Polyethylene glycol 3350) .Marland Kitchen.. 17gm in 8oz fluid daily as needed for constipation 13)  Colace 100 Mg Caps (Docusate sodium) .... Take one by mouth daily as needed constipation 14)  Flexeril 10 Mg Tabs (Cyclobenzaprine hcl) .... Take one by mouth two times a day for muscle aches 15)  Tramadol Hcl 50 Mg Tabs (Tramadol hcl) .... One by mouth two times a day as needed pain 16)  Zostavax 16109 Unt/0.57ml Solr (Zoster vaccine live) .... Take x 1 17)  Clarithromycin 500 Mg Tabs (Clarithromycin) .Marland Kitchen.. 1 tab by  mouth two times a day 18)  Reglan 10 Mg Tabs (Metoclopramide hcl) .Marland Kitchen.. 1 tab by mouth 30 minutes before meals and before bedtime  Patient Instructions: 1)  Good seeing you again today. 2)  I want you to be sure you get your new blood pressure medicine filled.  Metoprolol 150mg  twice per day.   3)  For now I want you to try the medication Reglan for your stomach problems.   4)  If this continues we will talk again about seeing the stomach doctor. 5)  I want to see you back in 2-3 weeks to re-check your blood pressure and to see how your stomach is doing.  Please bring all your medications with you to that appointment. 6)  Take Care. Prescriptions: REGLAN 10 MG TABS (METOCLOPRAMIDE HCL) 1 tab by mouth 30 minutes before meals and before bedtime  #120 x 0   Entered and Authorized by:   Everrett Coombe DO   Signed by:   Everrett Coombe DO on 11/15/2010   Method used:   Faxed to ...       [facility] (retail)       [address]       [place], [place]  [address]       Ph: [phone]       Fax: [phone]   RxID:   [id number]    Orders Added: 1)  A1C-[facility] [83036] 2)  Testosterone-[facility] [44010-27253] 3)  [facility]- Est  Level 4 [66440]
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
## 2011-01-11 NOTE — Miscellaneous (Signed)
Summary: re: Reglan/TS  pt walked into clinic. did not receive his 'stomach meds' yesterday. I see that Reglan was recommended. fwd to Dr.Matthews to send meds. Pt's pharmacy is [facility] Arlyss Repress CMA,  November 15, 2010 11:50 AM  Rx sent to [facility] for Reglan 10mg .  Thanks, Everrett Coombe

## 2011-01-11 NOTE — Assessment & Plan Note (Signed)
Summary: BP CHECK NURSE VISIT/RH  Nurse Visit  patient was signed in but when RN went to call him he was not in waiting room. Theresia Lo RN  December 06, 2010 5:26 PM   Allergies: 1)  ! * Enalapril  Orders Added: 1)  No Charge Patient Arrived ([id number]) [[id number]]

## 2011-01-11 NOTE — Assessment & Plan Note (Signed)
Summary: f/u htn, diabetes, gerd, shoulder pain   Vital Signs:  Patient profile:   64 year old male Weight:      252.7 pounds Temp:     99.2 degrees F oral Pulse rate:   93 / minute Pulse rhythm:   regular BP sitting:   162 / 104  (left arm) Cuff size:   large  Vitals Entered By: Loralee Pacas CMA (July 21, 2010 1:54 PM)  Primary Provider:  Eustaquio Boyden  MD   History of Present Illness: Patient here following up on the following  1. HTN- BP elevated today, states he has been under a lot of stress because he is behind on his bills.  Also not sleeping well, states he falls asleep and wakes up very easily.  Denies chest pain, headache, shortness of breath, vision changes  2. Diabetes- Checks CBG's at home daily, usually in the 120-130 range.  Tolerating 1000mg  of Metformin well.   3. Shoulder pain- Better since going through rehab, still with some residual pain and stiffness but uses the exercises/stretches prescribed to him with good results.  No numbness or tingling into arm and no weakness   4. Acid reflux- Gotten worse currently on max dose of nexium.  Does not want something tha medicare will not pay for.  Denies vomiting, hematemesis, and has not noticed dark tarry stools.      Diabetic Foot Exam Foot Inspection Is there a history of a foot ulcer?              No Is there a foot ulcer now?              No Can the patient see the bottom of their feet?          No Are the shoes appropriate in style and fit?          No Is there swelling or an abnormal foot shape?          No Are the toenails long?                No Are the toenails thick?                Yes Are the toenails ingrown?              No Is there heavy callous build-up?              Yes Is there pain in the calf muscle (Intermittent claudication) when walking?    NoIs there a claw toe deformity?              No Is there elevated skin temperature?            No Is there limited ankle dorsiflexion?             No Is there foot or ankle muscle weakness?            No  Diabetic Foot Care Education Patient educated on appropriate care of diabetic feet.  Pulse Check          Right Foot          Left Foot Posterior Tibial:        normal            normal Dorsalis Pedis:        normal            normal  High Risk Feet? No   10-g (5.07) Semmes-Weinstein Monofilament Test  Right Foot          Left Foot Visual Inspection     normal           normal Test Control      normal         normal Site 1         normal         normal Site 4         normal         normal Site 5         normal         normal Site 6         normal         normal Site 9         normal         normal  Current Medications (verified): 1)  Cardura 2 Mg  Tabs (Doxazosin Mesylate) .Marland Kitchen.. 1 By Mouth Once Daily, For Bph 2)  Viagra 100 Mg Tabs (Sildenafil Citrate) .... Take One Tablet 30 Min Prior To Relations 3)  Zyrtec Allergy 10 Mg Tabs (Cetirizine Hcl) .... Take 1 Tablet By Mouth Once A Day As Needed 4)  Nexium 40 Mg Cpdr (Esomeprazole Magnesium) .... One Daily 5)  Metformin Hcl 1000 Mg Tabs (Metformin Hcl) .... Take 1 Tablet By Mouth Two Times A Day 6)  Aspirin Ec 81 Mg  Tbec (Aspirin) .Marland Kitchen.. 1 By Mouth Qday 7)  Flonase 50 Mcg/act  Susp (Fluticasone Propionate) .... 2 Sprays Per Nostril Once Daily 8)  Metoprolol Tartrate 100 Mg Tabs (Metoprolol Tartrate) .Marland Kitchen.. 1 Tab By Mouth Bid 9)  Hydrochlorothiazide 25 Mg Tabs (Hydrochlorothiazide) .... Take One By Mouth Daily, Water Pill For Bp 10)  Amlodipine Besylate 10 Mg Tabs (Amlodipine Besylate) .... Take One Daily For Blood Pressure 11)  Truetrack Test  Strp (Glucose Blood) .... Check Once Daily 12)  Miralax  Powd (Polyethylene Glycol 3350) .Marland Kitchen.. 17gm in 8oz Fluid Daily As Needed For Constipation 13)  Colace 100 Mg Caps (Docusate Sodium) .... Take One By Mouth Daily As Needed Constipation 14)  Flexeril 10 Mg Tabs (Cyclobenzaprine Hcl) .... Take One By Mouth Two Times A Day For  Muscle Aches 15)  Tramadol Hcl 50 Mg Tabs (Tramadol Hcl) .... One By Mouth Two Times A Day As Needed Pain 16)  Zostavax 16109 Unt/0.3ml Solr (Zoster Vaccine Live) .... Take X 1  Allergies (verified): 1)  ! * Enalapril  Review of Systems       Pertinent positives and negatives noted in HPI, Vitals signs noted   Physical Exam  General:  Well-developed,well-nourished,in no acute distress; alert,appropriate and cooperative throughout examination Eyes:  No corneal or conjunctival inflammation noted. EOMI. Perrla.Vision grossly normal. Mouth:  Oral mucosa and oropharynx without lesions or exudates.  Teeth in good repair. Neck:  No deformities, masses, or tenderness noted. Lungs:  Normal respiratory effort, chest expands symmetrically. Lungs are clear to auscultation, no crackles or wheezes. Heart:  Normal rate and regular rhythm. S1 and S2 normal without gallop, murmur, click, rub or other extra sounds. Abdomen:  Mild epigastric tenderness, normal bowel sounds, no distention, no guarding, and no rebound tenderness.   Msk:  No deformity or scoliosis noted of thoracic or lumbar spine.  Extremities with FROM both passive and active   Impression & Recommendations:  Problem # 1:  HYPERTENSION, BENIGN SYSTEMIC (ICD-401.1) Recheck of BP 150/100, increased his metoprolol from 75mg  daily to 100mg  daily.  Will recheck in  two weeks to see how he is doing on this regimen His updated medication list for this problem includes:    Cardura 2 Mg Tabs (Doxazosin mesylate) .Marland Kitchen... 1 by mouth once daily, for bph    Metoprolol Tartrate 100 Mg Tabs (Metoprolol tartrate) .Marland Kitchen... 1 tab by mouth bid    Hydrochlorothiazide 25 Mg Tabs (Hydrochlorothiazide) .Marland Kitchen... Take one by mouth daily, water pill for bp    Amlodipine Besylate 10 Mg Tabs (Amlodipine besylate) .Marland Kitchen... Take one daily for blood pressure  Orders: Comp Met-[facility] (04540-98119) CBC-[facility] (14782) [facility]- Est  Level 4 (99214)  Problem # 2:  DIABETES-TYPE 2  (ICD-250.00) A1c 6.9 today, doing well with his diabetes, tolerating metformin well His updated medication list for this problem includes:    Metformin Hcl 1000 Mg Tabs (Metformin hcl) .Marland Kitchen... Take 1 tablet by mouth two times a day    Aspirin Ec 81 Mg Tbec (Aspirin) .Marland Kitchen... 1 by mouth qday  Orders: A1C-[facility] (95621) [facility]- Est  Level 4 (30865)  Problem # 3:  SHOULDER PAIN, LEFT (ICD-719.41)  Improved with PT, will leave on flexeril and tramadol as needed for acute flairs His updated medication list for this problem includes:    Aspirin Ec 81 Mg Tbec (Aspirin) .Marland Kitchen... 1 by mouth qday    Flexeril 10 Mg Tabs (Cyclobenzaprine hcl) .Marland Kitchen... Take one by mouth two times a day for muscle aches    Tramadol Hcl 50 Mg Tabs (Tramadol hcl) ..... One by mouth two times a day as needed pain  Orders: [facility]- Est  Level 4 (78469)  Problem # 4:  BENIGN PROSTATIC HYPERTROPHY, HX OF (ICD-V13.8)  Will check PSA today, had DRE in 05/2010 which was normal  Orders: [facility]- Est  Level 4 (62952)  Problem # 5:  G E R D (ICD-530.81) Worsening, will add on zantac for now to see if this helps.  Will re-evaluate in two weeks to see if any better.  May need work up for H. Pylori if continuing to have this.  His updated medication list for this problem includes:    Nexium 40 Mg Cpdr (Esomeprazole magnesium) ..... One daily  Complete Medication List: 1)  Cardura 2 Mg Tabs (Doxazosin mesylate) .Marland Kitchen.. 1 by mouth once daily, for bph 2)  Viagra 100 Mg Tabs (Sildenafil citrate) .... Take one tablet 30 min prior to relations 3)  Zyrtec Allergy 10 Mg Tabs (Cetirizine hcl) .... Take 1 tablet by mouth once a day as needed 4)  Nexium 40 Mg Cpdr (Esomeprazole magnesium) .... One daily 5)  Metformin Hcl 1000 Mg Tabs (Metformin hcl) .... Take 1 tablet by mouth two times a day 6)  Aspirin Ec 81 Mg Tbec (Aspirin) .Marland Kitchen.. 1 by mouth qday 7)  Flonase 50 Mcg/act Susp (Fluticasone propionate) .... 2 sprays per nostril once daily 8)  Metoprolol Tartrate  100 Mg Tabs (Metoprolol tartrate) .Marland Kitchen.. 1 tab by mouth bid 9)  Hydrochlorothiazide 25 Mg Tabs (Hydrochlorothiazide) .... Take one by mouth daily, water pill for bp 10)  Amlodipine Besylate 10 Mg Tabs (Amlodipine besylate) .... Take one daily for blood pressure 11)  Truetrack Test Strp (Glucose blood) .... Check once daily 12)  Miralax Powd (Polyethylene glycol 3350) .Marland Kitchen.. 17gm in 8oz fluid daily as needed for constipation 13)  Colace 100 Mg Caps (Docusate sodium) .... Take one by mouth daily as needed constipation 14)  Flexeril 10 Mg Tabs (Cyclobenzaprine hcl) .... Take one by mouth two times a day for muscle aches 15)  Tramadol Hcl 50  Mg Tabs (Tramadol hcl) .... One by mouth two times a day as needed pain 16)  Zostavax 16109 Unt/0.71ml Solr (Zoster vaccine live) .... Take x 1  Other Orders: Lipid-[facility] (60454-09811) PSA (Medicare)-[facility] (B1478) T-PSA Total ([address])  Patient Instructions: 1)  It was nice meeting you today. 2)  I have increased your metoprolol from 75mg  to 100 mg twice daily for your blood pressure.  I would like to see you back in two weeks to re-check your blood pressure.  Please make an appointment out front or call 3)  For your reflux you can try taking zantac otc along with the nexium, if this does not improve we will talk about other options when you return in two weeks. 4)  You are doing very well with your diabetes, keep up the good work! Prescriptions: METOPROLOL TARTRATE 100 MG TABS (METOPROLOL TARTRATE) 1 tab by mouth bid  #30 x 30   Entered and Authorized by:   Everrett Coombe DO   Signed by:   Everrett Coombe DO on 07/21/2010   Method used:   Faxed to ...       [facility] (retail)       [address]       [place], [place]  [address]       Ph: [phone]       Fax: [phone]   RxID:   [id number]   Laboratory Results   Blood Tests   Date/Time Received: July 21, 2010 1:59 PM  Date/Time Reported: July 21, 2010  2:06 PM   HGBA1C: 6.9%   (Normal Range: Non-Diabetic - 3-6%   Control Diabetic - 6-8%)  Comments: ...............test performed by............Marland KitchenLoralee Pacas, CMA .............entered by...........Marland KitchenBonnie A. [place], MLS (ASCP)cm     Prevention & Chronic Care Immunizations   Influenza vaccine: Fluvax MCR  (01/11/2010)   Influenza vaccine due: 08/10/2010    Tetanus booster: 01/11/2010: Td   Tetanus booster due: 01/12/2020    Pneumococcal vaccine: Pneumovax (Medicare)  (10/20/2007)   Pneumococcal vaccine due: 10/19/2012    H. zoster vaccine: Not documented  Colorectal Screening   Hemoccult: negative  (10/20/2007)   Hemoccult action/deferral: Not indicated  (05/23/2010)   Hemoccult due: 10/2008    Colonoscopy: Done.  (04/09/2006)   Colonoscopy due: 04/10/2011  Other Screening   PSA: 1.27  (06/08/2009)   PSA ordered.   PSA action/deferral: Discussed-PSA requested  (07/21/2010)   PSA due due: 06/08/2010   Smoking status: current  (05/23/2010)   Smoking cessation counseling: yes  (05/23/2010)  Diabetes Mellitus   HgbA1C: 6.9  (07/21/2010)   Hemoglobin A1C due: 05/14/2008    Eye exam: normal  (09/09/2007)   Eye exam due: 09/08/2008    Foot exam: yes  (12/27/2008)   Foot exam action/deferral: Do today   High risk foot: No  (07/21/2010)   Foot care education: Done  (07/21/2010)   Foot exam due: 08/19/2008    Urine microalbumin/creatinine ratio: Not documented   Urine microalbumin/cr due: 03/09/2009  Lipids   Total Cholesterol: 149  (10/13/2007)   LDL: 83  (10/13/2007)   LDL Direct: 92  (03/09/2008)   HDL: 34  (10/13/2007)   Triglycerides: 160  (10/13/2007)    SGOT (AST): 16  (06/06/2009)   SGPT (ALT): 15  (06/06/2009) CMP ordered    Alkaline phosphatase: 44  (06/06/2009)   Total bilirubin: 0.5  (06/06/2009)  Hypertension   Last Blood Pressure: 162 / 104  (07/21/2010)  Serum creatinine: 1.09  (06/06/2009)   Serum potassium 4.4  (06/06/2009) CMP ordered     Self-Management Support :   Personal Goals (by the next clinic visit) :     Personal A1C goal: 7  (09/20/2009)     Personal blood pressure goal: 140/90  (09/20/2009)     Personal LDL goal: 100  (09/20/2009)    Diabetes self-management support: Not documented    Diabetes self-management support not done because: Good outcomes  (09/20/2009)    Hypertension self-management support: BP self-monitoring log, Written self-care plan, Education handout  (01/11/2010)    Lipid self-management support: Not documented     Lipid self-management support not done because: Good outcomes  (03/24/2010)   Nursing Instructions: Diabetic foot exam today

## 2011-01-11 NOTE — Progress Notes (Signed)
  Phone Note Refill Request   Refills Requested: Medication #1:  METFORMIN HCL 1000 MG TABS Take 1 tablet by mouth two times a day Also need strips for glucometer  Initial call taken by: Abundio Miu,  December 05, 2010 2:47 PM  Follow-up for Phone Call        Reilled metformin and faxed in refill for test strips Follow-up by: Everrett Coombe DO,  December 05, 2010 4:55 PM    Prescriptions: METFORMIN HCL 1000 MG TABS (METFORMIN HCL) Take 1 tablet by mouth two times a day  #60 x 3   Entered and Authorized by:   Everrett Coombe DO   Signed by:   Everrett Coombe DO on 12/05/2010   Method used:   Faxed to ...       [facility] (retail)       [address]       [place], [place]  [address]       Ph: [phone]       Fax: [phone]   RxID:   [id number]

## 2011-01-11 NOTE — Assessment & Plan Note (Signed)
Summary: f/u GERD/HTN   FLU SHOT GIVEN TODAY.Brian Dominguez, CMA  September 26, 2010 3:40 PM   Vital Signs:  Patient profile:   65 year old male Height:      72.5 inches Weight:      253 pounds BMI:     33.96 Temp:     98.5 degrees F oral Pulse rate:   85 / minute BP sitting:   161 / 97  (left arm) Cuff size:   large  Vitals Entered By: Brian Dominguez, CMA (September 26, 2010 1:39 PM) CC: f/u meds Is Patient Diabetic? Yes Did you bring your meter with you today? No Pain Assessment Patient in pain? no        Primary Provider:  Everrett Coombe DO  CC:  f/u meds.  History of Present Illness: Patient here to follow up on   1.HTN:  Patient still has been hypertensive since adjusting metoprolol from 75mg  to 100mg .  Patient did not keep last appointment to f/u for blood pressure.  Pressure today once again elevated at161/97.  Denies smoking a cigarette before coming in.  States he is taking all of his medication but is not checking his BP at home.  He denies chest pain, n/v, headaches, vision changes.  2. GERD:  Reflux and epigastric pain have been worsening over the past couple of months.  Denies any recent change in diet.  Can not identify any foods that make worse.  Does have a history of H. Pylori.  Currently on PPI, takes regularly.  No blood in stool, no dark tarry stools.  No diarrhea.  Medications Prior to Update: 1)  Cardura 2 Mg  Tabs (Doxazosin Mesylate) .Marland Kitchen.. 1 By Mouth Once Daily, For Bph 2)  Viagra 100 Mg Tabs (Sildenafil Citrate) .... Take One Tablet 30 Min Prior To Relations 3)  Zyrtec Allergy 10 Mg Tabs (Cetirizine Hcl) .... Take 1 Tablet By Mouth Once A Day As Needed 4)  Nexium 40 Mg Cpdr (Esomeprazole Magnesium) .... One Daily 5)  Metformin Hcl 1000 Mg Tabs (Metformin Hcl) .... Take 1 Tablet By Mouth Two Times A Day 6)  Aspirin Ec 81 Mg  Tbec (Aspirin) .Marland Kitchen.. 1 By Mouth Qday 7)  Flonase 50 Mcg/act  Susp (Fluticasone Propionate) .... 2 Sprays Per Nostril Once Daily 8)   Metoprolol Tartrate 100 Mg Tabs (Metoprolol Tartrate) .Marland Kitchen.. 1 Tab By Mouth Bid 9)  Hydrochlorothiazide 25 Mg Tabs (Hydrochlorothiazide) .... Take One By Mouth Daily, Water Pill For Bp 10)  Amlodipine Besylate 10 Mg Tabs (Amlodipine Besylate) .... Take One Daily For Blood Pressure 11)  Truetrack Test  Strp (Glucose Blood) .... Check Once Daily 12)  Miralax  Powd (Polyethylene Glycol 3350) .Marland Kitchen.. 17gm in 8oz Fluid Daily As Needed For Constipation 13)  Colace 100 Mg Caps (Docusate Sodium) .... Take One By Mouth Daily As Needed Constipation 14)  Flexeril 10 Mg Tabs (Cyclobenzaprine Hcl) .... Take One By Mouth Two Times A Day For Muscle Aches 15)  Tramadol Hcl 50 Mg Tabs (Tramadol Hcl) .... One By Mouth Two Times A Day As Needed Pain 16)  Zostavax 24401 Unt/0.34ml Solr (Zoster Vaccine Live) .... Take X 1  Current Medications (verified): 1)  Cardura 2 Mg  Tabs (Doxazosin Mesylate) .Marland Kitchen.. 1 By Mouth Once Daily, For Bph 2)  Viagra 100 Mg Tabs (Sildenafil Citrate) .... Take One Tablet 30 Min Prior To Relations 3)  Zyrtec Allergy 10 Mg Tabs (Cetirizine Hcl) .... Take 1 Tablet By Mouth Once A Day As Needed  4)  Nexium 40 Mg Cpdr (Esomeprazole Magnesium) .... One Daily 5)  Metformin Hcl 1000 Mg Tabs (Metformin Hcl) .... Take 1 Tablet By Mouth Two Times A Day 6)  Aspirin Ec 81 Mg  Tbec (Aspirin) .Marland Kitchen.. 1 By Mouth Qday 7)  Flonase 50 Mcg/act  Susp (Fluticasone Propionate) .... 2 Sprays Per Nostril Once Daily 8)  Metoprolol Tartrate 100 Mg Tabs (Metoprolol Tartrate) .Marland Kitchen.. 1 1/2  Tab By Mouth Two Times A Day 9)  Hydrochlorothiazide 25 Mg Tabs (Hydrochlorothiazide) .... Take One By Mouth Daily, Water Pill For Bp 10)  Amlodipine Besylate 10 Mg Tabs (Amlodipine Besylate) .... Take One Daily For Blood Pressure 11)  Truetrack Test  Strp (Glucose Blood) .... Check Once Daily 12)  Miralax  Powd (Polyethylene Glycol 3350) .Marland Kitchen.. 17gm in 8oz Fluid Daily As Needed For Constipation 13)  Colace 100 Mg Caps (Docusate Sodium) ....  Take One By Mouth Daily As Needed Constipation 14)  Flexeril 10 Mg Tabs (Cyclobenzaprine Hcl) .... Take One By Mouth Two Times A Day For Muscle Aches 15)  Tramadol Hcl 50 Mg Tabs (Tramadol Hcl) .... One By Mouth Two Times A Day As Needed Pain 16)  Zostavax 16109 Unt/0.81ml Solr (Zoster Vaccine Live) .... Take X 1 17)  Clarithromycin 500 Mg Tabs (Clarithromycin) .Marland Kitchen.. 1 Tab By Mouth Two Times A Day  Allergies (verified): 1)  ! * Enalapril  Review of Systems       Pertinent positives and negatives noted in HPI, Vitals signs noted   Physical Exam  General:  Well-developed,well-nourished,in no acute distress; alert,appropriate and cooperative throughout examination Eyes:  No corneal or conjunctival inflammation noted. EOMI. Perrla.Vision grossly normal. Mouth:  Oral mucosa and oropharynx without lesions or exudates.  Teeth in good repair. Neck:  No deformities, masses, or tenderness noted. Lungs:  Normal respiratory effort, chest expands symmetrically. Lungs are clear to auscultation, no crackles or wheezes. Heart:  Normal rate and regular rhythm. S1 and S2 normal without gallop, murmur, click, rub or other extra sounds. Abdomen:  Mild epigastric tenderness, normal bowel sounds, no distention, no guarding, and no rebound tenderness.     Impression & Recommendations:  Problem # 1:  HYPERTENSION, BENIGN SYSTEMIC (ICD-401.1) Plan for increased blood pressure is to increase lopressor to 150mg  two times a day, and have him follow up in 2-3 weeks.  In the meantime instructed him to check his BP a few times a week and record the readings to bring back with him to his next appointment.  Encouraged him to stop smoking. His updated medication list for this problem includes:    Cardura 2 Mg Tabs (Doxazosin mesylate) .Marland Kitchen... 1 by mouth once daily, for bph    Metoprolol Tartrate 100 Mg Tabs (Metoprolol tartrate) .Marland Kitchen... 1 1/2  tab by mouth two times a day    Hydrochlorothiazide 25 Mg Tabs  (Hydrochlorothiazide) .Marland Kitchen... Take one by mouth daily, water pill for bp    Amlodipine Besylate 10 Mg Tabs (Amlodipine besylate) .Marland Kitchen... Take one daily for blood pressure  Orders: [facility]- Est Level  3 (60454)  Problem # 2:  GERD (ICD-530.81) For now will double up his PPI and check for H. Pylori again.  If test results positive will treat with Triple Therapy of PPI, clarithromycin and amoxicillin.  Will have him follow-up when he returns for BP recheck. His updated medication list for this problem includes:    Nexium 40 Mg Cpdr (Esomeprazole magnesium) ..... One daily  Orders: H pylori-[facility] [id number]) [facility]- Est Level  3 (29528)  Complete Medication List: 1)  Cardura 2 Mg Tabs (Doxazosin mesylate) .Marland Kitchen.. 1 by mouth once daily, for bph 2)  Viagra 100 Mg Tabs (Sildenafil citrate) .... Take one tablet 30 min prior to relations 3)  Zyrtec Allergy 10 Mg Tabs (Cetirizine hcl) .... Take 1 tablet by mouth once a day as needed 4)  Nexium 40 Mg Cpdr (Esomeprazole magnesium) .... One daily 5)  Metformin Hcl 1000 Mg Tabs (Metformin hcl) .... Take 1 tablet by mouth two times a day 6)  Aspirin Ec 81 Mg Tbec (Aspirin) .Marland Kitchen.. 1 by mouth qday 7)  Flonase 50 Mcg/act Susp (Fluticasone propionate) .... 2 sprays per nostril once daily 8)  Metoprolol Tartrate 100 Mg Tabs (Metoprolol tartrate) .Marland Kitchen.. 1 1/2  tab by mouth two times a day 9)  Hydrochlorothiazide 25 Mg Tabs (Hydrochlorothiazide) .... Take one by mouth daily, water pill for bp 10)  Amlodipine Besylate 10 Mg Tabs (Amlodipine besylate) .... Take one daily for blood pressure 11)  Truetrack Test Strp (Glucose blood) .... Check once daily 12)  Miralax Powd (Polyethylene glycol 3350) .Marland Kitchen.. 17gm in 8oz fluid daily as needed for constipation 13)  Colace 100 Mg Caps (Docusate sodium) .... Take one by mouth daily as needed constipation 14)  Flexeril 10 Mg Tabs (Cyclobenzaprine hcl) .... Take one by mouth two times a day for muscle aches 15)  Tramadol Hcl 50 Mg Tabs (Tramadol  hcl) .... One by mouth two times a day as needed pain 16)  Zostavax 41324 Unt/0.66ml Solr (Zoster vaccine live) .... Take x 1 17)  Clarithromycin 500 Mg Tabs (Clarithromycin) .Marland Kitchen.. 1 tab by mouth two times a day  Other Orders: Influenza Vaccine MCR (40102)  Patient Instructions: 1)  It was nice seeing you again. 2)  I want to increase you medication (lopressor) again to help control your blood pressure.  3)  I want you to try to check your BP 1-2 times per week.  You can get a cuff at the pharmacy or some pharmacies have the machine you can use in the store.  Write down your readings to bring to your next appointment. 4)  I would like to see you back in 2-3 weeks to see how your pressures are doing.   5)  Remember if you have chest pain, sever headache or vision change to go to your closest emergency department. 6)  For your indigestion I am checking for a bacteria that can cause stomach irritation.  I am also going to double you up on your nexium until the next time you see me to see if that improves symptoms. 7)  Please return to clinic in 2-3 weeks Prescriptions: CLARITHROMYCIN 500 MG TABS (CLARITHROMYCIN) 1 tab by mouth two times a day  #20 x 0   Entered and Authorized by:   Everrett Coombe DO   Signed by:   Everrett Coombe DO on 09/27/2010   Method used:   Faxed to ...       [facility] (retail)       [address]       [place], [place]  [address]       Ph: [phone]       Fax: [phone]   RxID:   [id number] AMOXICILLIN 500 MG CAPS (AMOXICILLIN) 2 tabs by mouth BID  #40 x 0   Entered and Authorized by:   Everrett Coombe DO   Signed by:   Selena Batten  Matthews DO on 09/27/2010   Method used:   Faxed to ...       [facility] (retail)       [address]       [place], [place]  [address]       Ph: [phone]       Fax: [phone]   RxID:   [id number] METOPROLOL TARTRATE 100 MG TABS (METOPROLOL TARTRATE) 1 1/2  tab  by mouth two times a day  #90 x 1   Entered and Authorized by:   Everrett Coombe DO   Signed by:   Everrett Coombe DO on 09/26/2010   Method used:   Faxed to ...       [facility] (retail)       [address]       [place], [place]  [address]       Ph: [phone]       Fax: [phone]   RxID:   [id number]    Orders Added: 1)  H pylori-[facility] [87339] 2)  H pylori-[facility] [87339] 3)  Influenza Vaccine MCR [00025] 4)  [facility]- Est Level  3 [06301]   Immunizations Administered:  Influenza Vaccine # 1:    Vaccine Type: Fluvax MCR    Site: left deltoid    Mfr: GlaxoSmithKline    Dose: 0.5 ml    Route: IM    Given by: Brian Dominguez, CMA    Exp. Date: 06/06/2011    Lot #: [id number]    VIS given: 07/03/07 version given September 26, 2010.  Flu Vaccine Consent Questions:    Do you have a history of severe allergic reactions to this vaccine? no    Any prior history of allergic reactions to egg and/or gelatin? no    Do you have a sensitivity to the preservative Thimersol? no    Do you have a past history of Guillan-Barre Syndrome? no    Do you currently have an acute febrile illness? no    Have you ever had a severe reaction to latex? no    Vaccine information given and explained to patient? yes   Immunizations Administered:  Influenza Vaccine # 1:    Vaccine Type: Fluvax MCR    Site: left deltoid    Mfr: GlaxoSmithKline    Dose: 0.5 ml    Route: IM    Given by: Brian Dominguez, CMA    Exp. Date: 06/06/2011    Lot #: [id number]    VIS given: 07/03/07 version given September 26, 2010.  Laboratory Results   Blood Tests   Date/Time Received: September 26, 2010 3:33 PM  Date/Time Reported: September 26, 2010 3:58 PM    H. pylori: positive Comments: ...............test performed by......Marland KitchenBonnie A. [place], MLS (ASCP)cm

## 2011-01-11 NOTE — Consult Note (Signed)
Summary: [facility] Discharge Summary  [facility] Discharge Summary   Imported By: Clydell Hakim 07/12/2010 09:34:13  _____________________________________________________________________  External Attachment:    Type:   Image     Comment:   External Document

## 2011-01-11 NOTE — Progress Notes (Signed)
Summary: stress test  Phone Note Call from Patient Call back at Home Phone [phone]   Caller: Patient Summary of Call: went to [facility] ED Monday and was told he needed to get a stress test and his doctor is supposed to set this up Initial call taken by: De Nurse,  March 03, 2010 11:12 AM  Follow-up for Phone Call        needs to come in for this as an ER follow up.  was supposed to come this montha nyway for HTN, DM f/u. Follow-up by: Eustaquio Boyden  MD,  March 03, 2010 5:08 PM

## 2011-01-11 NOTE — Letter (Signed)
Summary: Lipid Letter  [facility]  [address]   [place], [place] [address]   Phone: [phone]  Fax: [phone]    07/24/2010  Brian Dominguez [address] [place], [place]  [address]  Dear Brian Dominguez:  We have carefully reviewed your last lipid profile from 07/21/2010 and the results are noted below with a summary of recommendations for lipid management.    Cholesterol:       168     Goal: <200   HDL "good" Cholesterol:   37     Goal: >40   LDL "bad" Cholesterol:   85     Goal: <100       Triglycerides:       230     Goal: <150    Your other labs were within normal limits including your PSA.    TLC Diet (Therapeutic Lifestyle Change): Saturated Fats & Transfatty acids should be kept < 7% of total calories ***Reduce Saturated Fats Polyunstaurated Fat can be up to 10% of total calories Monounsaturated Fat Fat can be up to 20% of total calories Total Fat should be no greater than 25-35% of total calories Carbohydrates should be 50-60% of total calories Protein should be approximately 15% of total calories Fiber should be at least 20-30 grams a day ***Increased fiber may help lower LDL Total Cholesterol should be < 200mg /day Consider adding plant stanol/sterols to diet (example: Benacol spread) ***A higher intake of unsaturated fat may reduce Triglycerides and Increase HDL    Adjunctive Measures (may lower LIPIDS and reduce risk of Heart Attack) include: Aerobic Exercise (20-30 minutes 3-4 times a week) Limit Alcohol Consumption Weight Reduction Aspirin 75-81 mg a day by mouth (if not allergic or contraindicated) Dietary Fiber 20-30 grams a day by mouth     Current Medications: 1)    Cardura 2 Mg  Tabs (Doxazosin mesylate) .Marland Kitchen.. 1 by mouth once daily, for bph 2)    Viagra 100 Mg Tabs (Sildenafil citrate) .... Take one tablet 30 min prior to relations 3)    Zyrtec Allergy 10 Mg Tabs (Cetirizine hcl) .... Take 1 tablet by mouth once a day as needed 4)     Nexium 40 Mg Cpdr (Esomeprazole magnesium) .... One daily 5)    Metformin Hcl 1000 Mg Tabs (Metformin hcl) .... Take 1 tablet by mouth two times a day 6)    Aspirin Ec 81 Mg  Tbec (Aspirin) .Marland Kitchen.. 1 by mouth qday 7)    Flonase 50 Mcg/act  Susp (Fluticasone propionate) .... 2 sprays per nostril once daily 8)    Metoprolol Tartrate 100 Mg Tabs (Metoprolol tartrate) .Marland Kitchen.. 1 tab by mouth bid 9)    Hydrochlorothiazide 25 Mg Tabs (Hydrochlorothiazide) .... Take one by mouth daily, water pill for bp 10)    Amlodipine Besylate 10 Mg Tabs (Amlodipine besylate) .... Take one daily for blood pressure 11)    Truetrack Test  Strp (Glucose blood) .... Check once daily 12)    Miralax  Powd (Polyethylene glycol 3350) .Marland Kitchen.. 17gm in 8oz fluid daily as needed for constipation 13)    Colace 100 Mg Caps (Docusate sodium) .... Take one by mouth daily as needed constipation 14)    Flexeril 10 Mg Tabs (Cyclobenzaprine hcl) .... Take one by mouth two times a day for muscle aches 15)    Tramadol Hcl 50 Mg Tabs (Tramadol hcl) .... One by mouth two times a day as needed pain 16)    Zostavax 03474 Unt/0.78ml Solr (  Zoster vaccine live) .... Take x 1  If you have any questions, please call. We appreciate being able to work with you.   Sincerely,    [facility] Everrett Coombe DO  Appended Document: Lipid Letter mailed
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
## 2011-01-15 ENCOUNTER — Inpatient Hospital Stay (INDEPENDENT_AMBULATORY_CARE_PROVIDER_SITE_OTHER)
Admission: RE | Admit: 2011-01-15 | Discharge: 2011-01-15 | Disposition: A | Discharge status: Home / Self Care | Payer: PRIVATE HEALTH INSURANCE | Source: Ambulatory Visit | Attending: Family Medicine | Admitting: Family Medicine

## 2011-01-15 DIAGNOSIS — R109 Unspecified abdominal pain: Secondary | ICD-10-CM

## 2011-01-19 ENCOUNTER — Other Ambulatory Visit: Payer: Self-pay | Admitting: *Deleted

## 2011-01-19 DIAGNOSIS — I10 Essential (primary) hypertension: Secondary | ICD-10-CM

## 2011-01-19 MED ORDER — HYDROCHLOROTHIAZIDE 25 MG PO TABS: 25.0000 mg | ORAL_TABLET | Freq: Every day | ORAL | Status: DC

## 2011-01-26 ENCOUNTER — Ambulatory Visit: Payer: PRIVATE HEALTH INSURANCE | Admitting: Family Medicine

## 2011-02-06 ENCOUNTER — Other Ambulatory Visit: Payer: Self-pay | Admitting: *Deleted

## 2011-02-06 DIAGNOSIS — J310 Chronic rhinitis: Secondary | ICD-10-CM

## 2011-02-06 MED ORDER — FLUTICASONE PROPIONATE 50 MCG/ACT NA SUSP: 2.0000 | Freq: Every day | NASAL | Status: DC

## 2011-02-16 ENCOUNTER — Ambulatory Visit: Payer: PRIVATE HEALTH INSURANCE | Admitting: Family Medicine

## 2011-02-19 LAB — DIFFERENTIAL
Basophils Relative: 1 % (ref 0–1)
Eosinophils Absolute: 0.3 10*3/uL (ref 0.0–0.7)
Eosinophils Relative: 5 % (ref 0–5)
Lymphs Abs: 2.4 10*3/uL (ref 0.7–4.0)
Monocytes Relative: 8 % (ref 3–12)
Neutrophils Relative %: 39 % — ABNORMAL LOW (ref 43–77)

## 2011-02-19 LAB — CBC
HCT: 44.4 % (ref 39.0–52.0)
Hemoglobin: 15.6 g/dL (ref 13.0–17.0)
MCH: 29.4 pg (ref 26.0–34.0)
RBC: 5.31 MIL/uL (ref 4.22–5.81)

## 2011-02-19 LAB — POCT I-STAT, CHEM 8
BUN: 14 mg/dL (ref 6–23)
Calcium, Ion: 1.13 mmol/L (ref 1.12–1.32)
Creatinine, Ser: 1.2 mg/dL (ref 0.4–1.5)
Hemoglobin: 16 g/dL (ref 13.0–17.0)
Sodium: 139 mEq/L (ref 135–145)
TCO2: 31 mmol/L (ref 0–100)

## 2011-02-19 LAB — LIPASE, BLOOD: Lipase: 46 U/L (ref 11–59)

## 2011-02-26 ENCOUNTER — Ambulatory Visit: Payer: PRIVATE HEALTH INSURANCE | Admitting: Family Medicine

## 2011-03-05 LAB — POCT I-STAT, CHEM 8
Calcium, Ion: 1.15 mmol/L (ref 1.12–1.32)
Creatinine, Ser: 1 mg/dL (ref 0.4–1.5)
Glucose, Bld: 113 mg/dL — ABNORMAL HIGH (ref 70–99)
Hemoglobin: 16.3 g/dL (ref 13.0–17.0)
Potassium: 3.4 mEq/L — ABNORMAL LOW (ref 3.5–5.1)
TCO2: 29 mmol/L (ref 0–100)

## 2011-03-05 LAB — DIFFERENTIAL
Basophils Relative: 0 % (ref 0–1)
Eosinophils Absolute: 0.2 10*3/uL (ref 0.0–0.7)
Eosinophils Relative: 3 % (ref 0–5)
Lymphs Abs: 1.9 10*3/uL (ref 0.7–4.0)
Monocytes Absolute: 0.5 10*3/uL (ref 0.1–1.0)
Monocytes Relative: 8 % (ref 3–12)

## 2011-03-05 LAB — CBC
HCT: 45.5 % (ref 39.0–52.0)
MCHC: 34.5 g/dL (ref 30.0–36.0)
MCV: 87.3 fL (ref 78.0–100.0)
RBC: 5.22 MIL/uL (ref 4.22–5.81)
WBC: 5.8 10*3/uL (ref 4.0–10.5)

## 2011-03-05 LAB — POCT CARDIAC MARKERS
CKMB, poc: 1.1 ng/mL (ref 1.0–8.0)
CKMB, poc: <1 ng/mL — ABNORMAL LOW (ref 1.0–8.0)
Myoglobin, poc: 60.9 ng/mL (ref 12–200)
Troponin i, poc: <0.05 ng/mL (ref 0.00–0.09)

## 2011-03-12 ENCOUNTER — Ambulatory Visit (INDEPENDENT_AMBULATORY_CARE_PROVIDER_SITE_OTHER): Payer: PRIVATE HEALTH INSURANCE | Admitting: Family Medicine

## 2011-03-12 ENCOUNTER — Encounter: Payer: Self-pay | Admitting: Family Medicine

## 2011-03-12 VITALS — BP 160/98 | HR 68 | Temp 98.3°F | Wt 252.0 lb

## 2011-03-12 DIAGNOSIS — I10 Essential (primary) hypertension: Secondary | ICD-10-CM

## 2011-03-12 DIAGNOSIS — E119 Type 2 diabetes mellitus without complications: Secondary | ICD-10-CM

## 2011-03-12 DIAGNOSIS — K219 Gastro-esophageal reflux disease without esophagitis: Secondary | ICD-10-CM

## 2011-03-12 DIAGNOSIS — J309 Allergic rhinitis, unspecified: Secondary | ICD-10-CM

## 2011-03-12 DIAGNOSIS — J302 Other seasonal allergic rhinitis: Secondary | ICD-10-CM | POA: Insufficient documentation

## 2011-03-12 MED ORDER — HYDROCHLOROTHIAZIDE 25 MG PO TABS: 25.0000 mg | ORAL_TABLET | Freq: Every day | ORAL | Status: DC

## 2011-03-12 MED ORDER — CETIRIZINE HCL 10 MG PO TABS: 10.0000 mg | ORAL_TABLET | Freq: Every day | ORAL | Status: DC

## 2011-03-12 MED ORDER — METOCLOPRAMIDE HCL 10 MG PO TABS: 5.0000 mg | ORAL_TABLET | Freq: Four times a day (QID) | ORAL | Status: DC

## 2011-03-12 NOTE — Assessment & Plan Note (Signed)
Still having quite a bit of dyspepsia despite bid PPI dosing, treatment of H. Pylori.  Will re-prescribe reglan for him to try since only tried 2 days previously.  Possible diabetic gastroparesis vs. Gerd vs pud.  Will refer to GI for further evaluation

## 2011-03-12 NOTE — Assessment & Plan Note (Signed)
A1C 7.1 today, will make no medication adjustments today.  Continue to monitor home glucose.  Will recheck A1c in 3 months to ensure still having good glycemic control.

## 2011-03-12 NOTE — Assessment & Plan Note (Signed)
Currently only taking metoprolol at home.  Will restart HCTZ and have him return in two weeks for blood pressure re-check.  Will add back on norvasc if needed at follow up appointment. Reviewed red flag symptoms including chest pain and stroke symptoms.

## 2011-03-12 NOTE — Progress Notes (Signed)
  Subjective:    Patient ID: Brian Dominguez, male    DOB: 27-Apr-1946, 65 y.o.   MRN: [id number]  HPI Here to follow up on  1. Diabetes:  Doing well taking medicine as prescribed.  No change in activity since last being in office.  Checking glucose at home ranging from 120-150 on average.  Denies any episodes of low blood sugar.   2. Hypertension:  Has only been taking his metoprolol, stopped his HCTZ and amlodipine.  Unsure why he stopped these medications.  Has cut out a lot of salt from his diet and does not add extra salt as he was doing before.  Does not check blood pressures at home 3. Reflux/Dyspepsia:  PPI changed to bid but still little help with reflux symptoms.  Treated for H. Pylori and still with dyspepsia.  Feels like food just sits in his esophagus and stomach after he eats.  Has pain in epigastric region associated with this.  Was given reglan to try at last appointment but only took twice. 4.  Seasonal allergies/allergic rhinitis:  Allergies currently giving him a problem.  Has been using flonase which helps with congestion but is not taking anything else. Complains of eyes watery and itchy with scratchy throat sometimes.      Review of Systems  Constitutional: Negative for fever and chills.  HENT: Positive for congestion.   Respiratory: Negative for shortness of breath.   Cardiovascular: Negative for chest pain, palpitations and leg swelling.  Gastrointestinal: Positive for nausea. Negative for vomiting, blood in stool and abdominal distention.       Objective:   Physical Exam  Vitals reviewed. Constitutional: He appears well-developed and well-nourished. No distress.  HENT:  Head: Normocephalic and atraumatic.  Eyes: Right conjunctiva is injected. Left conjunctiva is injected.       Mild injection b/l   Cardiovascular: Normal rate, regular rhythm and normal heart sounds.   Pulmonary/Chest: Effort normal and breath sounds normal.  Abdominal: Soft. Bowel sounds are  normal. He exhibits no distension. There is tenderness. There is no rebound and no guarding.       Epigastric tenderness   Musculoskeletal: He exhibits no edema.

## 2011-03-12 NOTE — Assessment & Plan Note (Signed)
Continue flonase for AR, will restart cetirizine since this seemed to work for him in the past.  Rx called in to [facility].

## 2011-03-12 NOTE — Patient Instructions (Signed)
Nice seeing you today.  I would like to get you back on some of your blood pressure medicine.  I am going to restart your Hydrochlorothiazide first and I want you to come back in two weeks to have your blood pressure rechecked.  I will also send in a prescription for the reglan for you to try again as well as a referral to the stomach doctor (gastroenterologist).  If you have questions please call our office.

## 2011-03-13 ENCOUNTER — Telehealth: Payer: Self-pay | Admitting: Family Medicine

## 2011-03-13 NOTE — Telephone Encounter (Signed)
Mr. Seder says he need an appt for the GI specialist before 5/23.  Want to have a referral to someone else for this month.

## 2011-03-15 ENCOUNTER — Telehealth: Payer: Self-pay | Admitting: *Deleted

## 2011-03-15 NOTE — Telephone Encounter (Signed)
I have called all GI practices in [place]. He has been dismissed from one & owes money to another before they will consider making him another appt. The others all want records sent to them from previous md before they will consider making him an appt. LM for pt to call here. Need to find out if he can pay [facility] as this is going to affect making him an appt.

## 2011-03-16 LAB — DIFFERENTIAL
Eosinophils Relative: 6 % — ABNORMAL HIGH (ref 0–5)
Lymphocytes Relative: 40 % (ref 12–46)
Lymphs Abs: 2.1 10*3/uL (ref 0.7–4.0)
Monocytes Absolute: 0.4 10*3/uL (ref 0.1–1.0)

## 2011-03-16 LAB — COMPREHENSIVE METABOLIC PANEL
AST: 21 U/L (ref 0–37)
Albumin: 4.4 g/dL (ref 3.5–5.2)
Calcium: 10 mg/dL (ref 8.4–10.5)
Chloride: 101 mEq/L (ref 96–112)
Creatinine, Ser: 0.95 mg/dL (ref 0.4–1.5)
GFR calc Af Amer: >60 mL/min (ref >60)
Sodium: 137 mEq/L (ref 135–145)
Total Bilirubin: 0.6 mg/dL (ref 0.3–1.2)

## 2011-03-16 LAB — CBC
MCV: 86.9 fL (ref 78.0–100.0)
Platelets: 241 10*3/uL (ref 150–400)
WBC: 5.4 10*3/uL (ref 4.0–10.5)

## 2011-03-16 LAB — URINE MICROSCOPIC-ADD ON

## 2011-03-16 LAB — URINALYSIS, ROUTINE W REFLEX MICROSCOPIC
Bilirubin Urine: NEGATIVE
Hgb urine dipstick: NEGATIVE
Specific Gravity, Urine: 1.028 (ref 1.005–1.030)
pH: 7 (ref 5.0–8.0)

## 2011-03-17 LAB — DIFFERENTIAL
Basophils Absolute: 0.1 10*3/uL (ref 0.0–0.1)
Eosinophils Relative: 5 % (ref 0–5)
Lymphocytes Relative: 33 % (ref 12–46)

## 2011-03-17 LAB — BASIC METABOLIC PANEL
BUN: 11 mg/dL (ref 6–23)
Calcium: 9.7 mg/dL (ref 8.4–10.5)
GFR calc non Af Amer: >60 mL/min (ref >60)
Glucose, Bld: 144 mg/dL — ABNORMAL HIGH (ref 70–99)

## 2011-03-17 LAB — POCT CARDIAC MARKERS
CKMB, poc: <1 ng/mL — ABNORMAL LOW (ref 1.0–8.0)
Myoglobin, poc: 72.2 ng/mL (ref 12–200)
Troponin i, poc: <0.05 ng/mL (ref 0.00–0.09)

## 2011-03-17 LAB — GLUCOSE, CAPILLARY: Glucose-Capillary: 141 mg/dL — ABNORMAL HIGH (ref 70–99)

## 2011-03-17 LAB — CBC
HCT: 46.8 % (ref 39.0–52.0)
Platelets: 234 10*3/uL (ref 150–400)
RDW: 13.8 % (ref 11.5–15.5)

## 2011-03-19 ENCOUNTER — Telehealth: Payer: Self-pay | Admitting: Family Medicine

## 2011-03-19 NOTE — Telephone Encounter (Signed)
Wants to know about referral to GI

## 2011-03-19 NOTE — Telephone Encounter (Signed)
Called pt and lmvm with appt info. Dr.Hung 03-21-11 at 10 am. (see referral) Brian Dominguez

## 2011-03-22 LAB — LIPASE, BLOOD: Lipase: 36 U/L (ref 11–59)

## 2011-03-22 LAB — CBC
HCT: 44.8 % (ref 39.0–52.0)
Hemoglobin: 15.1 g/dL (ref 13.0–17.0)
MCHC: 33.6 g/dL (ref 30.0–36.0)
MCV: 86.9 fL (ref 78.0–100.0)
RDW: 14.5 % (ref 11.5–15.5)

## 2011-03-22 LAB — URINALYSIS, ROUTINE W REFLEX MICROSCOPIC
Bilirubin Urine: NEGATIVE
Hgb urine dipstick: NEGATIVE
Specific Gravity, Urine: 1.031 — ABNORMAL HIGH (ref 1.005–1.030)
Urobilinogen, UA: 1 mg/dL (ref 0.0–1.0)

## 2011-03-22 LAB — COMPREHENSIVE METABOLIC PANEL
BUN: 5 mg/dL — ABNORMAL LOW (ref 6–23)
Calcium: 9.4 mg/dL (ref 8.4–10.5)
Creatinine, Ser: 1.02 mg/dL (ref 0.4–1.5)
Glucose, Bld: 127 mg/dL — ABNORMAL HIGH (ref 70–99)
Sodium: 138 mEq/L (ref 135–145)
Total Protein: 6.5 g/dL (ref 6.0–8.3)

## 2011-03-22 LAB — DIFFERENTIAL
Basophils Absolute: 0 10*3/uL (ref 0.0–0.1)
Basophils Relative: 0 % (ref 0–1)
Eosinophils Absolute: 0.2 10*3/uL (ref 0.0–0.7)
Eosinophils Relative: 6 % — ABNORMAL HIGH (ref 0–5)
Monocytes Absolute: 0.3 10*3/uL (ref 0.1–1.0)

## 2011-03-26 ENCOUNTER — Ambulatory Visit: Payer: PRIVATE HEALTH INSURANCE | Admitting: Family Medicine

## 2011-04-13 ENCOUNTER — Other Ambulatory Visit: Payer: Self-pay | Admitting: Family Medicine

## 2011-04-13 DIAGNOSIS — I10 Essential (primary) hypertension: Secondary | ICD-10-CM

## 2011-04-16 ENCOUNTER — Encounter: Payer: Self-pay | Admitting: Home Health Services

## 2011-04-27 NOTE — Discharge Summary (Signed)
[facility]  Patient:    Brian Dominguez, PANEBIANCO Visit Number: [id number] MRN: [id number]          Service Type: Attending:  Abigail Miyamoto, M.D. Dictated by:   Abigail Miyamoto, M.D. Proc. Date: 09/12/01 Adm. Date:  09/12/01 Disc. Date: 09/16/01                             Discharge Summary  HISTORY OF PRESENT ILLNESS:  Mr. Brian Dominguez is a 65 year old gentleman who has had multiple abdominal procedures performed.  He is admitted for elective laparoscopic cholecystectomy for symptomatic cholelithiasis.  HOSPITAL COURSE:  The patient was admitted on September 12, 2001, and taken to the operating room where he underwent a laparoscopic converted to open cholecystectomy.  The procedure was converted to open secondary to his dense adhesions.  The patient tolerated the procedure well and was taken in stable condition to a regular surgical floor.  Postoperatively he had an uneventful course.  He did have some mild fever postoperatively, but this was thought to be secondary to atelectasis and improved with pulmonary toilet.  His diet was slowly advanced and, by September 15, 2001, he was tolerating a regular diet without nausea and vomiting.  He was having normal bowel movements, incision was healing well, and decision was made to discharge the patient to home.  DISCHARGE DIAGNOSIS:  Symptomatic cholelithiasis, status post open cholecystectomy.  DIET:  Regular.  ACTIVITY:  As tolerated.  He is to do no heavy lifting.  MEDICATIONS:  He will take Vicodin for pain.  He will resume his other home pain medications.  WOUND CARE:  He may shower.  FOLLOW-UP:  He will follow up in my office in one week postdischarge. Dictated by:   Abigail Miyamoto, M.D. Attending:  Abigail Miyamoto, M.D. DD:  10/19/01 TD:  10/19/01 Job: [id number]

## 2011-04-27 NOTE — Op Note (Signed)
NAME:  Brian Dominguez, Brian Dominguez NO.:  [id number]   MEDICAL RECORD NO.:  [id number]                   PATIENT TYPE:  AMB   LOCATION:  [facility]                                  FACILITY:  [facility]   PHYSICIAN:  Feliberto Gottron. Turner Daniels, M.D.                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   POSTOPERATIVE DIAGNOSES:  1. Left knee loose bodies, cartilage and osteochondral.  2. Cyclops lesion in the notch probably secondary to an inflamed ligamentum     mucosum.  3. Posterior horn lateral meniscal tear.  4. 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  5. 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   PROCEDURE:  Arthroscopic debridement of left knee and debridement of  chondromalacia with flap tears of the trochlea.   SURGEON:  Feliberto Gottron. Turner Daniels, M.D.   ANESTHESIA:  Local with IV sedation.   ESTIMATED BLOOD LOSS:  Minimal.   FLUIDS REPLACED:  800 mL crystalloid.   DRAINS PLACED:  None.   TOURNIQUET TIME:  None.   INDICATION FOR PROCEDURE:  A 65 year old man followed for arthritic changes  of the left and right knee with increasing pain in the left knee, catching  and popping, consistent with medial meniscal tear versus chondromalacia of  the medial femoral condyle.  He has failed conservative treatment with anti-  inflammatory medicines, physical therapy, stretching, and observation.  X-  rays do show [place] grade 2 changes to the joint, and he is taken for  arthroscopic debridement of his left knee.   DESCRIPTION OF PROCEDURE:  The patient identified by arm band, taken to the  operating room at [facility] day surgery center.  Appropriate anesthetic monitors  were attached and local anesthesia with IV sedation was induced into the  left knee.  Lateral post applied to the table  and the left lower extremity  prepped and draped in the usual sterile fashion from the ankle to the  midthigh.  Using a #11 blade, standard inferomedial and inferolateral  peripatellar portals were then made allowing introduction of the arthroscope  through the inferolateral portal and the outflow through the inferomedial  portal.  We immediately encountered cartilage loose bodies in the joint  fluid, which were taken through the outflow, and the pressure was kept  between 60 and 90 mm.  Grade 2 chondromalacia of the patella was lightly  debrided.  Grade 3 chondromalacia with flap tears of the trochlea was  debrided.  The medial and lateral femoral condyles distally and posteriorly  had flap tears as well, grade 3, which were debrided.  In the notch the  patient had a Cyclops type of lesion flipping in and out of the joint that  was probably  an inflamed ligamentum mucosum versus a partial tear of the  ACL, although when we removed the Cyclops lesion the underlying ACL appeared  to be in good condition.  There was a posterior horn of the lateral meniscus  tear that was also debrided and a loose body on the anterolateral tibial  spine region which was also removed.  The medial meniscus was intact.  The  medial and lateral tibial condyles had grade 2 chondromalacia, lightly  debrided.  The gutters were cleared.  The knee was then washed out with  normal saline solution and the arthroscopic instruments removed.  A dressing  of Xeroform, 4 x 4 dressing sponges, Webril, and an Ace wrap applied.  The  patient was awakened and taken to the recovery room without difficulty.                                                Feliberto Gottron. Turner Daniels, M.D.    Ovid Curd  D:  08/11/2003  T:  08/11/2003  Job:  [id number]

## 2011-04-27 NOTE — Op Note (Signed)
[place]  Patient:    Brian Dominguez, Brian Dominguez                       MRN: [id number] Proc. Date: 03/24/01 Adm. Date:  [id number] Disc. 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  OPERATION PERFORMED:  Right knee arthroscopic partial medial meniscectomy debridement of chondral loose bodies with donor sites from the lateral femoral condyle and medial femoral condyle as well as the trochlea and patella with grade three chondromalacia with flap tears.  SURGEON:  Alinda Deem, M.D.  ASSISTANT:  Dorthula Matas, P.A.-C.  ANESTHESIA:  Local with IV sedation.  ESTIMATED BLOOD LOSS:  Minimal.  FLUID REPLACEMENT:  800 cc of crystalloid.  TOURNIQUET TIME:  None.  INDICATIONS FOR PROCEDURE:  The patient is a 65 year old gentleman who has been followed for right knee pain with catching and locking symptoms for the last four to six months.  He has failed conservative treatment with anti-inflammatory medicines, physical therapy, cortisone injections, MRI scan showed degenerative tearing of the posterior horn of the medial meniscus, chondromalacia to multiple compartments of the knee as well as possible loose bodies.  Plain x-rays, on the other hand, only showed [place] grade 1 changes.  Because of persistent pain and catching he desires elective arthroscopic evaluation and treatment of his right knee.  DESCRIPTION OF PROCEDURE:  The patient was identified by arm band and taken to the operating room at [facility] where the appropriate anesthetic monitors were attached and local anesthesia with IV sedation was induced with the patient in the supine position.  The right lower  extremity was then prepped and draped in the usual sterile fashion from the ankle to the mid thigh and a lateral post was applied to the table.  Using a #11 blade, standard inferomedial and inferolateral peripatellar portals were then made allowing introduction of the arthroscope to the inferolateral portal and the outflow through the inferomedial portal.  We immediately noted some small cartilaginous loose bodies in the outflow.  The arthroscope revealed grade 3 chondromalacia of the lateral facet of the patella which was debrided back to stable margins as was grade 3 chondromalacia of the trochlea along a 1 x 2 cm strip.  Moving to the medial compartment, the posterior horn of the medial meniscus was noted to be torn with a complex tear pattern which required debridement with the straight biters and the 4.2 [place] sucker shaver. Chondromalacia of the medial femoral condyle with grade 3 that was pretty much global was also debrided with flap tears.  The medial tibial condyle had also some focal grade 3 chondromalacia that was debrided.  The ACL and the PCL were intact.  The lateral femoral condyle had focal grade 2 chondromalacia 1 x 2 cm area which was debrided and some more cartilaginous loose bodies were taken through the outflow.  At this point the knee was washed out with normal saline solution.  The gutters were cleared.  We did get a glimpse of the posterior compartments medial and lateral to the PCL and those were also cleared.  At this point the  arthroscopic instruments were removed.  A dressing of Xeroform, 4 x 8 dressing sponges, Webril and an Ace wrap applied.  The patient was awakened and taken to the recovery room without difficulty. DD:  03/24/01 TD:  03/24/01 Job: [id number]

## 2011-04-27 NOTE — Op Note (Signed)
NAME:  Brian Dominguez, Brian Dominguez NO.:  [id number]   MEDICAL RECORD NO.:  [id number]          PATIENT TYPE:  AMB   LOCATION:  ENDO                         FACILITY:  [facility]   PHYSICIAN:  Shirley Friar, MDDATE OF BIRTH:  May 20, 1946   DATE OF PROCEDURE:  04/23/2006  DATE OF DISCHARGE:                                 OPERATIVE REPORT   PROCEDURE:  Colonoscopy.   PHYSICIAN:  Shirley Friar, M.D.   INDICATIONS:  Screening.   MEDICATIONS:  Fentanyl 80 mcg IV, Versed 8 mg IV.   FINDINGS:  Rectal exam was normal.   SUMMARY:  An adult colonoscope was inserted into a fair prepped colon and  advanced to the cecum where the ileocecal valve and appendiceal orifice were  identified.  In order to reach the cecum, multiple maneuvers were necessary  likely due to intra-abdominal adhesions.  The patient was rotated onto his  back and then onto his right lateral decubitus position.  Still there was  difficulty reaching the cecum despite multiple areas of abdominal pressure.  The patient was then returned back on his back and with the help of staff  and abdominal pressure, I was able to reach the cecal base.  Careful  withdrawal of the colonoscope revealed scattered right sided diverticulosis,  otherwise, normal colonoscopy.  There was a fair prep which may have  prevented visualization of small polyps but no large lesions or polyps were  seen.  Retroflexion was normal.   ASSESSMENT:  Scattered right-sided diverticulosis, otherwise normal  colonoscopy.   PLAN:  1.  Repeat colonoscopy in five years due to the patient's fair prep.  2.  High fiber diet.      Shirley Friar, MD  Electronically Signed     VCS/MEDQ  D:  04/23/2006  T:  04/23/2006  Job:  [id number]
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
## 2011-05-05 ENCOUNTER — Other Ambulatory Visit: Payer: Self-pay | Admitting: Family Medicine

## 2011-05-10 ENCOUNTER — Ambulatory Visit: Payer: PRIVATE HEALTH INSURANCE | Admitting: Family Medicine

## 2011-06-11 ENCOUNTER — Ambulatory Visit: Payer: PRIVATE HEALTH INSURANCE | Admitting: Family Medicine

## 2011-06-15 ENCOUNTER — Other Ambulatory Visit: Payer: Self-pay | Admitting: Family Medicine

## 2011-06-15 NOTE — Telephone Encounter (Signed)
Will refill x1 month but patient needs to see me to continue  Getting refills.

## 2011-06-15 NOTE — Telephone Encounter (Signed)
Refill request

## 2011-06-20 ENCOUNTER — Other Ambulatory Visit: Payer: Self-pay | Admitting: Family Medicine

## 2011-06-20 NOTE — Telephone Encounter (Signed)
Refill request

## 2011-06-21 ENCOUNTER — Ambulatory Visit (INDEPENDENT_AMBULATORY_CARE_PROVIDER_SITE_OTHER): Payer: PRIVATE HEALTH INSURANCE | Admitting: Family Medicine

## 2011-06-21 ENCOUNTER — Encounter: Payer: Self-pay | Admitting: Family Medicine

## 2011-06-21 VITALS — BP 174/111 | HR 65 | Wt 254.9 lb

## 2011-06-21 DIAGNOSIS — F17200 Nicotine dependence, unspecified, uncomplicated: Secondary | ICD-10-CM

## 2011-06-21 DIAGNOSIS — E119 Type 2 diabetes mellitus without complications: Secondary | ICD-10-CM

## 2011-06-21 DIAGNOSIS — N529 Male erectile dysfunction, unspecified: Secondary | ICD-10-CM

## 2011-06-21 DIAGNOSIS — I10 Essential (primary) hypertension: Secondary | ICD-10-CM

## 2011-06-21 DIAGNOSIS — K219 Gastro-esophageal reflux disease without esophagitis: Secondary | ICD-10-CM

## 2011-06-21 DIAGNOSIS — Z87898 Personal history of other specified conditions: Secondary | ICD-10-CM

## 2011-06-21 LAB — COMPREHENSIVE METABOLIC PANEL
ALT: 13 U/L (ref 0–53)
CO2: 25 mEq/L (ref 19–32)
Calcium: 9.5 mg/dL (ref 8.4–10.5)
Chloride: 105 mEq/L (ref 96–112)
Creat: 0.9 mg/dL (ref 0.50–1.35)
Glucose, Bld: 123 mg/dL — ABNORMAL HIGH (ref 70–99)
Total Protein: 7 g/dL (ref 6.0–8.3)

## 2011-06-21 LAB — CBC
HCT: 44.7 % (ref 39.0–52.0)
Platelets: 287 10*3/uL (ref 150–400)
RBC: 5.3 MIL/uL (ref 4.22–5.81)
RDW: 14.2 % (ref 11.5–15.5)
WBC: 6 10*3/uL (ref 4.0–10.5)

## 2011-06-21 LAB — POCT GLYCOSYLATED HEMOGLOBIN (HGB A1C): Hemoglobin A1C: 6.7

## 2011-06-21 MED ORDER — DOXAZOSIN MESYLATE 2 MG PO TABS: 2.0000 mg | ORAL_TABLET | Freq: Every day | ORAL | Status: DC

## 2011-06-21 MED ORDER — HYDROCHLOROTHIAZIDE 25 MG PO TABS
25.0000 mg | ORAL_TABLET | Freq: Every day | ORAL | Status: DC
Start: 2011-06-21 — End: 2012-05-21

## 2011-06-21 MED ORDER — AMLODIPINE BESYLATE 10 MG PO TABS: 10.0000 mg | ORAL_TABLET | Freq: Every day | ORAL | Status: DC

## 2011-06-21 MED ORDER — SILDENAFIL CITRATE 100 MG PO TABS: 100.0000 mg | ORAL_TABLET | ORAL | Status: DC | PRN

## 2011-06-21 NOTE — Assessment & Plan Note (Signed)
BP elevated today.  Will get him back on his amlodipine and HCTZ.  Have him return for recheck of blood pressure in two weeks

## 2011-06-21 NOTE — Assessment & Plan Note (Signed)
A1c indicates better glycemic control.  Doing well with diabetes overall.  Will check CMET today to evaluate liver and kidney function. Plan for foot exam at next visit.

## 2011-06-21 NOTE — Patient Instructions (Signed)
It was nice seeing you today.  For you blood pressure please be sure to take the amlodipine 10mg , Hydrochlorothiazide 25mg  and metoprolol 150mg  in the morning and take metoprolol 150mg  at night.  I would like to see you back in two weeks to see how your blood pressure is doing on all of your medication.  Also if your shoulder is still bothering you at that time we may try to inject it.  If you have questions please let us know.  Have a good day!

## 2011-06-21 NOTE — Assessment & Plan Note (Signed)
Refilled cardura

## 2011-06-21 NOTE — Assessment & Plan Note (Signed)
Continue PPI, having esophageal manometry coming up.  Followed by Dr. Elnoria Howard

## 2011-06-21 NOTE — Assessment & Plan Note (Signed)
Stopped smoking x 1 month ago, encouraged to continue discontinuation.

## 2011-06-21 NOTE — Telephone Encounter (Signed)
Refilled today in clini

## 2011-06-21 NOTE — Assessment & Plan Note (Signed)
Given refill on sildenafil

## 2011-06-21 NOTE — Progress Notes (Signed)
  Subjective:    Patient ID: Brian Dominguez, male    DOB: [birth year], 65 y.o.   MRN: [id number]  HPI Here to follow up on  1. DM:  Doing well with medications.  Sugars have been pretty well controlled at home.  Glucose 130-140 when he checks.  Not on ACE-I 2/2 to angioedema.  No diet changes, is walking more. 2. HTN:  Only taking metoprolol at home. BP elevated today.  Does not check blood pressure at home.  No extra salt.  Denies chest pain, headaches, sob, nausea, vomiting, vision changes.  Did quit smoking x 6month ago 3. GERD:  Continues to have problems with reflux.  Referred to Dr. Elnoria Howard who did upper and lower endoscopy.  Told he had signs of acid reflux and is supposed to be having esophageal manometry as he does have some symptoms of food getting stuck in his throat.  Advised to stop reglan but continue nexium.  Denies blood in stool, nausea.    Review of Systems See HPI    Objective:   Physical Exam  Constitutional: He appears well-developed and well-nourished. No distress.  Cardiovascular: Normal rate, regular rhythm and normal heart sounds.   Pulmonary/Chest: Effort normal and breath sounds normal.  Abdominal: Soft. Bowel sounds are normal. He exhibits no distension. There is no tenderness.  Musculoskeletal: He exhibits no edema.          Assessment & Plan:

## 2011-06-22 ENCOUNTER — Encounter: Payer: Self-pay | Admitting: Family Medicine

## 2011-06-22 ENCOUNTER — Other Ambulatory Visit: Payer: Self-pay | Admitting: Family Medicine

## 2011-06-22 NOTE — Telephone Encounter (Signed)
This encounter was created in error - please disregard.

## 2011-06-29 ENCOUNTER — Encounter: Payer: Self-pay | Admitting: Family Medicine

## 2011-07-02 ENCOUNTER — Ambulatory Visit (HOSPITAL_COMMUNITY)
Admission: RE | Admit: 2011-07-02 | Discharge: 2011-07-02 | Disposition: A | Discharge status: Home / Self Care | Payer: PRIVATE HEALTH INSURANCE | Source: Ambulatory Visit | Attending: Gastroenterology | Admitting: Gastroenterology

## 2011-07-02 DIAGNOSIS — K219 Gastro-esophageal reflux disease without esophagitis: Secondary | ICD-10-CM | POA: Insufficient documentation

## 2011-07-02 DIAGNOSIS — R1013 Epigastric pain: Secondary | ICD-10-CM | POA: Insufficient documentation

## 2011-07-05 ENCOUNTER — Ambulatory Visit (INDEPENDENT_AMBULATORY_CARE_PROVIDER_SITE_OTHER): Payer: PRIVATE HEALTH INSURANCE | Admitting: *Deleted

## 2011-07-05 ENCOUNTER — Encounter: Payer: Self-pay | Admitting: *Deleted

## 2011-07-05 VITALS — BP 146/86 | HR 72

## 2011-07-05 DIAGNOSIS — I10 Essential (primary) hypertension: Secondary | ICD-10-CM

## 2011-07-05 NOTE — Progress Notes (Signed)
Patient comes in for BP check today. States he is taking all meds as directed. BP checked manually using regular adult  cuff.  BP[ LA 146/88 and RA 146/ 86 pulse 72.  Will forward message to MD.    also patient thought he was going to see  Dr. Ashley Royalty today. He continues to have pain in left shoulder and wants an injection  because Dr. Ashley Royalty had told him when he came back for BP check if shoulder still bothering him would inject.  Explained to patient that since he had appointment on Nurse schedule today will need to make appointment for him to see doctor. No appointment avaialble today. Appointment scheduled tomorrow at 9:15 with [place].

## 2011-07-06 ENCOUNTER — Ambulatory Visit (INDEPENDENT_AMBULATORY_CARE_PROVIDER_SITE_OTHER): Payer: PRIVATE HEALTH INSURANCE | Admitting: Family Medicine

## 2011-07-06 ENCOUNTER — Encounter: Payer: Self-pay | Admitting: Family Medicine

## 2011-07-06 DIAGNOSIS — M62838 Other muscle spasm: Secondary | ICD-10-CM

## 2011-07-06 DIAGNOSIS — M542 Cervicalgia: Secondary | ICD-10-CM

## 2011-07-06 DIAGNOSIS — M25619 Stiffness of unspecified shoulder, not elsewhere classified: Secondary | ICD-10-CM

## 2011-07-06 MED ORDER — BACLOFEN 5 MG HALF TABLET: 5.0000 mg | ORAL_TABLET | Freq: Three times a day (TID) | ORAL | Status: DC

## 2011-07-06 MED ORDER — DICLOFENAC SODIUM 100 MG PO TB24: 100.0000 mg | ORAL_TABLET | Freq: Two times a day (BID) | ORAL | Status: DC

## 2011-07-06 NOTE — Assessment & Plan Note (Signed)
D/w Dr. Sheffield Slider.  Appears c/w cervical radiculopathy/nerve impingement.  Suggested wave pillow to keep neck in neutral position during sleep and sleeping on back as well as improving day time posture.  Will try 7 day course of diclofenac and baclofen to see if combination of scheduled anti-inflammatory and anti-spasmodic helps.  Also suggested heating pads.  Will obtain Xray records from ortho that were taken a few months ago.  May need dedicated neck films to assess for joint space narrowing or spurs.  Informed pt to call or return in he began having weakness in his hand or arm or numbness/tingling that did not resolve.  F/u in 1-2 weeks w/ PCP or me to reassess.

## 2011-07-06 NOTE — Patient Instructions (Addendum)
I'm sorry your neck is bothering you. I have sending in a prescription for an anti-inflammatory medicine and a muscle relaxant to help with the neck pain. You should also try to use heat on the area 3x per day for 15-20 minutes. Try to buy a wave pillow to help keep your head in a neutral position when sleeping and try to sleep on your back at night.  Try to improved your posture and keep your shoulders back and make sure any work you are doing is at eye level so that you are not looking up for long periods of time. Come back and see Dr. Ashley Royalty in 1-2 weeks to see how your pain is doing.  In the meantime, we will get the x-ray results from your orthopedic doctor to see if they took any pictures of your neck.

## 2011-07-06 NOTE — Progress Notes (Signed)
  Subjective:    Brian Dominguez is a 64 y.o. male who presents with left shoulder and neck pain. The symptoms began several months ago. Aggravating factors: no known event. Pain is located between the neck and shoulder, around the acromioclavicular [facility]) joint and diffusely throughout the shoulder and into the neck. Discomfort is described as aching and throbbing. Symptoms are exacerbated by lying on the shoulder and sleeping/not using. Evaluation to date: plain films: done at outside facility and Ortho consult: had bone spur removed in 2000 and was given steroid injection into shoulder ~ 3 months ago. Therapy to date includes: ice, OTC analgesics which are not very effective and home exercises which are not very effective.  Over the past 2 months, has been having muscle spasms/tightening in the left side of his neck and into the muscles on the top/back of shoulder.  Has also had some occasional left hand tingling but not weakness.  No fevers or chills.  No problems with right side.  Does have a h/o bone spur in his left shoulder joint which was removed in 2000. Has had decreased ROM in his neck when looking to left, otherwise has full use of his left arm and shoulder.  The following portions of the patient's history were reviewed and updated as appropriate: allergies, current medications, past medical history, past surgical history and problem list.  Review of Systems Pertinent items are noted in HPI.   Objective:    BP 137/88  Pulse 72  Temp(Src) 97.7 F (36.5 C) (Oral)  Ht 6\' 1"  (1.854 m)  Wt 255 lb (115.667 kg)  BMI 33.64 kg/m2 Right shoulder: non-specific diffuse tenderness about the shoulder, full ROM, positive for impingement sign, sensory exam normal, motor exam normal and radial pulse intact ; decreased ROM of neck when looking to left, very painful to look to left and up; neg spurlings sign  Left shoulder: normal active ROM, no tenderness, no impingement sign     Assessment:    Left shoulder pain    Plan:   Wave pillow and sleeping on back; improved posture  Natural history and expected course discussed. Questions answered. Agricultural engineer distributed. NSAIDs per medication orders. Follow up in 2 weeks.

## 2011-07-18 ENCOUNTER — Other Ambulatory Visit: Payer: Self-pay | Admitting: Family Medicine

## 2011-07-18 NOTE — Telephone Encounter (Signed)
Refill request

## 2011-07-19 ENCOUNTER — Other Ambulatory Visit: Payer: Self-pay | Admitting: Family Medicine

## 2011-07-19 NOTE — Telephone Encounter (Signed)
Refill request

## 2011-07-25 ENCOUNTER — Ambulatory Visit (INDEPENDENT_AMBULATORY_CARE_PROVIDER_SITE_OTHER): Payer: PRIVATE HEALTH INSURANCE | Admitting: Family Medicine

## 2011-07-25 ENCOUNTER — Encounter: Payer: Self-pay | Admitting: Family Medicine

## 2011-07-25 DIAGNOSIS — M25519 Pain in unspecified shoulder: Secondary | ICD-10-CM

## 2011-07-25 NOTE — Progress Notes (Signed)
  Subjective:    Patient ID: Brian Dominguez, male    DOB: 01/05/1946, 65 y.o.   MRN: [id number]  HPI 1.  Shoulder pain:  Patient here to f/u on shoulder pain.  Still present but has improved some with NSAID and muscle relaxants.  Still has function of the arm but does bother with over use.  Pain mostly between shoulder blades radiating up into proximal shoulder/lower neck.  Denies numbness/tingling into arm or hands, no loss of strength.     Review of Systems Per HPI    Objective:   Physical Exam  Musculoskeletal:       Left shoulder: He exhibits normal range of motion, no tenderness, no bony tenderness and no swelling.       Thoracic back: He exhibits tenderness.       Tenderness and spasm along rhomboids and mid to upper trazezius. Pain in left shoulder not reproduced with hawkins, neer, scarf, internal/external rotation, subscap liftoff tests.  Strength 5/5          Assessment & Plan:

## 2011-07-26 MED ORDER — MELOXICAM 15 MG PO TABS: 15.0000 mg | ORAL_TABLET | Freq: Every day | ORAL | Status: DC

## 2011-07-26 MED ORDER — BACLOFEN 10 MG PO TABS: 10.0000 mg | ORAL_TABLET | Freq: Three times a day (TID) | ORAL | Status: DC

## 2011-07-29 DIAGNOSIS — M25519 Pain in unspecified shoulder: Secondary | ICD-10-CM | POA: Insufficient documentation

## 2011-07-29 NOTE — Assessment & Plan Note (Signed)
Still with continued pain.  Pain does not seem seem to be in the actual shoulder joint.  Seems to be referred pain from spasm in trapezius and rhomboids.  Will continue NSAID and muscle relaxant therapy for now.  I showed him some stretches to try to target the rhomboids and trapezius rather than the shoulder itself.  Patient has theraband at home that he will try.  If not improving will consider sending to sports medicine or for formal PT for further evaluation and treatment.

## 2011-08-09 ENCOUNTER — Ambulatory Visit: Payer: PRIVATE HEALTH INSURANCE | Admitting: Family Medicine

## 2011-09-05 ENCOUNTER — Other Ambulatory Visit: Payer: Self-pay | Admitting: Family Medicine

## 2011-09-05 NOTE — Telephone Encounter (Signed)
Refill request

## 2011-09-07 LAB — CBC
Hemoglobin: 14.9
RBC: 5.07
WBC: 5.5

## 2011-09-07 LAB — URINALYSIS, ROUTINE W REFLEX MICROSCOPIC
Protein, ur: NEGATIVE
Specific Gravity, Urine: 1.013
Urobilinogen, UA: 1

## 2011-09-07 LAB — URINE MICROSCOPIC-ADD ON

## 2011-09-07 LAB — DIFFERENTIAL
Basophils Relative: 0
Eosinophils Absolute: 0.3
Monocytes Relative: 7
Neutrophils Relative %: 52

## 2011-09-07 LAB — COMPREHENSIVE METABOLIC PANEL
ALT: 14
Alkaline Phosphatase: 42
CO2: 25
Chloride: 106
GFR calc non Af Amer: >60
Glucose, Bld: 151 — ABNORMAL HIGH
Potassium: 3.6
Sodium: 139
Total Protein: 6.3

## 2011-09-07 LAB — URINE CULTURE

## 2011-09-11 LAB — GLUCOSE, CAPILLARY

## 2011-09-13 ENCOUNTER — Telehealth: Payer: Self-pay | Admitting: Family Medicine

## 2011-09-13 NOTE — Telephone Encounter (Signed)
Brian Dominguez is hoping that he can get some pain medicine called into [facility] on [facility] [address]  He has an appt for 10/19.  He didn't want to see anyone else.

## 2011-09-13 NOTE — Telephone Encounter (Signed)
Spoke with patient.  His shoulder is still bothering him.  Currently taking tramadol 50mg  TID.  Told him that he could increase to 100mg  and take every 6 hours if needed.  York Spaniel he would try that and call if pain still not improving.  He will need an appointment sooner than 10/19, if he wants anything stronger than the tramadol.

## 2011-09-13 NOTE — Telephone Encounter (Signed)
Will fwd. To Dr.Matthews for review. .Brian Dominguez  

## 2011-09-15 ENCOUNTER — Other Ambulatory Visit: Payer: Self-pay | Admitting: Family Medicine

## 2011-09-16 NOTE — Telephone Encounter (Signed)
Refill request

## 2011-09-24 LAB — COMPREHENSIVE METABOLIC PANEL
AST: 30
Albumin: 4
BUN: 9
Creatinine, Ser: 0.99
GFR calc Af Amer: >60
Potassium: 4.4
Total Protein: 6.9

## 2011-09-24 LAB — URINALYSIS, ROUTINE W REFLEX MICROSCOPIC
Bilirubin Urine: NEGATIVE
Glucose, UA: >1000 — AB
Ketones, ur: NEGATIVE
Nitrite: NEGATIVE
pH: 6

## 2011-09-24 LAB — CBC
HCT: 47.1
MCV: 83.4
Platelets: 295
RDW: 13.8

## 2011-09-24 LAB — DIFFERENTIAL
Eosinophils Relative: 5
Lymphocytes Relative: 36
Monocytes Absolute: 0.4
Monocytes Relative: 7
Neutro Abs: 2.9

## 2011-09-28 ENCOUNTER — Ambulatory Visit (HOSPITAL_COMMUNITY)
Admission: RE | Admit: 2011-09-28 | Discharge: 2011-09-28 | Disposition: A | Discharge status: Home / Self Care | Payer: PRIVATE HEALTH INSURANCE | Source: Ambulatory Visit | Attending: Family Medicine | Admitting: Family Medicine

## 2011-09-28 ENCOUNTER — Ambulatory Visit (INDEPENDENT_AMBULATORY_CARE_PROVIDER_SITE_OTHER): Payer: PRIVATE HEALTH INSURANCE | Admitting: Family Medicine

## 2011-09-28 ENCOUNTER — Encounter: Payer: Self-pay | Admitting: Family Medicine

## 2011-09-28 ENCOUNTER — Other Ambulatory Visit: Payer: Self-pay | Admitting: Family Medicine

## 2011-09-28 VITALS — BP 166/88 | HR 72 | Wt 257.8 lb

## 2011-09-28 DIAGNOSIS — M19019 Primary osteoarthritis, unspecified shoulder: Secondary | ICD-10-CM | POA: Insufficient documentation

## 2011-09-28 DIAGNOSIS — M25519 Pain in unspecified shoulder: Secondary | ICD-10-CM

## 2011-09-28 DIAGNOSIS — E119 Type 2 diabetes mellitus without complications: Secondary | ICD-10-CM

## 2011-09-28 MED ORDER — TRAMADOL HCL 50 MG PO TABS: 50.0000 mg | ORAL_TABLET | Freq: Four times a day (QID) | ORAL | Status: DC | PRN

## 2011-10-02 NOTE — Progress Notes (Signed)
  Subjective:    Patient ID: Brian Dominguez, male    DOB: [birth year], 65 y.o.   MRN: [id number]  HPI 1. Shoulder pain:  Comes in today with continued shoulder pain.  Pain was in upper trapezius with radiation to the shoulder now with increased pain in the neck with radiation to the L shoulder.  Hurts to move in all directions.  He does have numbness in his left thumb with associated tingling down the L arm.  He is able to grip ok with his left hand.  Has been trying NSAIDS and muscle relaxants which do help some but not as well as previous.   Review of Systems     Objective:   Physical Exam  Constitutional: He appears well-developed and well-nourished. No distress.  HENT:  Head: Normocephalic and atraumatic.  Neck: Neck supple.       DROM in all planes  Cardiovascular: Normal rate, regular rhythm and normal heart sounds.   Shoulder: Inspection reveals no abnormalities, atrophy or asymmetry. Palpation is normal with no tenderness over AC joint or bicipital groove. ROM is decreased in all planes. Rotator cuff strength normal throughout. No signs of impingement with negative Neer and Hawkin's tests, empty can. Speeds and Yergason's tests normal. No labral pathology noted with negative Obrien's, negative clunk and good stability. Normal scapular function observed. No apprehension sign Tenderness of upper trapezius and painful with overhead movements.   Strength 5/5 Reflexes 2+ with exception of tricep on L which is 1+ Spurlings negative        Assessment & Plan:

## 2011-10-02 NOTE — Assessment & Plan Note (Signed)
Still with continued pain.  As noted previously this does not seem to actually involve the shoulder joint.  Given his neck involvement and distribution I suspect he has some lower cervical radiculopathy pain.  I will treat his pain with tramadol for now, briefly discussed neurontin would like to reserve this if tramadol does not work because he does not want anything else that makes him feel tired.  Will get plain films of the neck, and proceed with MRI if DDD or spurring noted.

## 2011-10-03 ENCOUNTER — Telehealth: Payer: Self-pay | Admitting: Family Medicine

## 2011-10-03 NOTE — Telephone Encounter (Signed)
Called patient to give him results of x-rays.  Significant DJD of neck with spurring likely contributing to his symptoms.  Will refer to ortho for further management.

## 2011-10-03 NOTE — Telephone Encounter (Signed)
Message copied by Everrett Coombe on Wed Oct 03, 2011  4:41 PM ------      Message from: Manfred Arch      Created: Fri Sep 28, 2011 10:44 AM                   ----- Message -----         From: Rad Results In Interface         Sent: 09/28/2011   9:57 AM           To: Carney Living, MD

## 2011-10-09 ENCOUNTER — Telehealth: Payer: Self-pay | Admitting: Family Medicine

## 2011-10-09 DIAGNOSIS — M542 Cervicalgia: Secondary | ICD-10-CM

## 2011-10-09 DIAGNOSIS — M25519 Pain in unspecified shoulder: Secondary | ICD-10-CM

## 2011-10-09 NOTE — Telephone Encounter (Signed)
Please sched appt with Ortho and contact patient when completed.  Mr. Desaulniers calling to say also a MRI was to be ordered as well.  See telephone note from 10/24 for ortho appt.

## 2011-10-10 NOTE — Telephone Encounter (Signed)
Order entered and referral letter forwarded to red team.  Will hold off on MRI to see if Ortho would like to get this instead.

## 2011-10-11 ENCOUNTER — Telehealth: Payer: Self-pay | Admitting: *Deleted

## 2011-10-11 NOTE — Telephone Encounter (Signed)
Sched. appt with Dr.Rowan (pt has seen him before) for Fri 10-19-11 at 8:30 am.  Called pt and informed of appt. Lorenda Hatchet, Renato Battles

## 2011-11-07 ENCOUNTER — Encounter: Payer: Self-pay | Admitting: Home Health Services

## 2011-11-08 ENCOUNTER — Ambulatory Visit: Payer: PRIVATE HEALTH INSURANCE

## 2011-11-19 ENCOUNTER — Ambulatory Visit
Payer: PRIVATE HEALTH INSURANCE | Attending: Orthopedic Surgery | Admitting: Rehabilitative and Restorative Service Providers"

## 2011-11-19 DIAGNOSIS — R293 Abnormal posture: Secondary | ICD-10-CM | POA: Insufficient documentation

## 2011-11-19 DIAGNOSIS — IMO0001 Reserved for inherently not codable concepts without codable children: Secondary | ICD-10-CM | POA: Insufficient documentation

## 2011-11-19 DIAGNOSIS — M542 Cervicalgia: Secondary | ICD-10-CM | POA: Insufficient documentation

## 2011-11-21 ENCOUNTER — Ambulatory Visit: Payer: PRIVATE HEALTH INSURANCE | Admitting: Rehabilitation

## 2011-11-26 ENCOUNTER — Ambulatory Visit: Payer: PRIVATE HEALTH INSURANCE | Admitting: Rehabilitation

## 2011-11-26 ENCOUNTER — Other Ambulatory Visit: Payer: Self-pay | Admitting: Family Medicine

## 2011-11-26 NOTE — Telephone Encounter (Signed)
Refill request

## 2011-11-28 ENCOUNTER — Ambulatory Visit: Payer: PRIVATE HEALTH INSURANCE | Admitting: Rehabilitation

## 2011-12-06 ENCOUNTER — Ambulatory Visit: Payer: PRIVATE HEALTH INSURANCE | Admitting: Rehabilitation

## 2011-12-10 ENCOUNTER — Encounter: Payer: PRIVATE HEALTH INSURANCE | Admitting: Rehabilitative and Restorative Service Providers"

## 2011-12-12 ENCOUNTER — Ambulatory Visit
Payer: PRIVATE HEALTH INSURANCE | Attending: Orthopedic Surgery | Admitting: Rehabilitative and Restorative Service Providers"

## 2011-12-12 DIAGNOSIS — R293 Abnormal posture: Secondary | ICD-10-CM | POA: Insufficient documentation

## 2011-12-12 DIAGNOSIS — M542 Cervicalgia: Secondary | ICD-10-CM | POA: Insufficient documentation

## 2011-12-12 DIAGNOSIS — IMO0001 Reserved for inherently not codable concepts without codable children: Secondary | ICD-10-CM | POA: Insufficient documentation

## 2011-12-17 ENCOUNTER — Encounter: Payer: PRIVATE HEALTH INSURANCE | Admitting: Rehabilitative and Restorative Service Providers"

## 2011-12-20 ENCOUNTER — Ambulatory Visit: Payer: PRIVATE HEALTH INSURANCE | Admitting: Rehabilitative and Restorative Service Providers"

## 2012-01-15 ENCOUNTER — Telehealth: Payer: Self-pay | Admitting: Family Medicine

## 2012-01-15 NOTE — Telephone Encounter (Signed)
Patient is calling to speak to Dr. Ashley Royalty about some things that were brought up at his last appt.

## 2012-01-15 NOTE — Telephone Encounter (Signed)
Called pt and he reports, that Dr.Matthews was talking with him about a 'pump for men' and he is interested right now. He has talked with some people about it. He wants to send some paperwork to Dr.M. I told the pt that I will send his message to Dr.Matthews. Also, he requested a flu shot. Scheduled for tomorrow with nurse. Fwd. To Dr.M. For review .Arlyss Repress

## 2012-01-16 ENCOUNTER — Ambulatory Visit (INDEPENDENT_AMBULATORY_CARE_PROVIDER_SITE_OTHER): Payer: PRIVATE HEALTH INSURANCE | Admitting: *Deleted

## 2012-01-16 DIAGNOSIS — Z23 Encounter for immunization: Secondary | ICD-10-CM

## 2012-01-28 ENCOUNTER — Telehealth: Payer: Self-pay | Admitting: Family Medicine

## 2012-01-28 ENCOUNTER — Ambulatory Visit (HOSPITAL_COMMUNITY)
Admission: RE | Admit: 2012-01-28 | Discharge: 2012-01-28 | Disposition: A | Discharge status: Home / Self Care | Payer: PRIVATE HEALTH INSURANCE | Source: Ambulatory Visit | Attending: Family Medicine | Admitting: Family Medicine

## 2012-01-28 ENCOUNTER — Other Ambulatory Visit: Payer: Self-pay

## 2012-01-28 ENCOUNTER — Ambulatory Visit (INDEPENDENT_AMBULATORY_CARE_PROVIDER_SITE_OTHER): Payer: PRIVATE HEALTH INSURANCE | Admitting: Family Medicine

## 2012-01-28 VITALS — BP 150/87 | HR 71 | Ht 73.0 in | Wt 255.0 lb

## 2012-01-28 DIAGNOSIS — I10 Essential (primary) hypertension: Secondary | ICD-10-CM

## 2012-01-28 DIAGNOSIS — M25519 Pain in unspecified shoulder: Secondary | ICD-10-CM

## 2012-01-28 DIAGNOSIS — R079 Chest pain, unspecified: Secondary | ICD-10-CM | POA: Insufficient documentation

## 2012-01-28 DIAGNOSIS — E119 Type 2 diabetes mellitus without complications: Secondary | ICD-10-CM

## 2012-01-28 DIAGNOSIS — N529 Male erectile dysfunction, unspecified: Secondary | ICD-10-CM

## 2012-01-28 LAB — POCT GLYCOSYLATED HEMOGLOBIN (HGB A1C): Hemoglobin A1C: 6.7

## 2012-01-28 MED ORDER — DOXYCYCLINE HYCLATE 100 MG PO TABS: 100.0000 mg | ORAL_TABLET | Freq: Two times a day (BID) | ORAL | Status: DC

## 2012-01-28 MED ORDER — MINOCYCLINE HCL 100 MG PO CAPS: 100.0000 mg | ORAL_CAPSULE | Freq: Two times a day (BID) | ORAL | Status: AC

## 2012-01-28 NOTE — Telephone Encounter (Signed)
Patient was in earlier and the medication that was sent in for him, they do not have, so he is asking if a different medication can be sent.  [facility].  Please call him back before he leaves today.

## 2012-01-28 NOTE — Telephone Encounter (Signed)
Substituted with minocycline

## 2012-01-28 NOTE — Patient Instructions (Signed)
Thank you for coming in today, it was good to see you Your diabetes looks well controlled. I would keep up what you are doing and try to exercise when you can.  Continue to try and cut back on your smoking, it will help with your breathing and make you feel better overall. Your blood pressure looks ok today Regarding your dizziness, it may be related to your possible pneumonia or blood pressure I would like for you to make an appointment with me to follow up on your possible depression.

## 2012-01-28 NOTE — Telephone Encounter (Signed)
Called patient and given results of CXR.  EXplained to him there was no PNA.  Just chronic bronchitis changes.  Given duration of symptoms and smoking status will go ahead and treat acutely with abx.  doxycycilne sent to [facility].

## 2012-01-28 NOTE — Telephone Encounter (Signed)
Forward to PCP for new Rx

## 2012-01-28 NOTE — Telephone Encounter (Signed)
Message copied by Everrett Coombe on Mon Jan 28, 2012  4:36 PM ------      Message from: Tivis Ringer      Created: Mon Jan 28, 2012 11:09 AM                   ----- Message -----         From: Rad Results In Interface         Sent: 01/28/2012  10:42 AM           To: Sanjuana Letters, MD

## 2012-02-04 ENCOUNTER — Other Ambulatory Visit: Payer: Self-pay | Admitting: Family Medicine

## 2012-02-04 NOTE — Assessment & Plan Note (Signed)
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  Suggested f/u with ortho.

## 2012-02-04 NOTE — Assessment & Plan Note (Signed)
BP looks ok today, continue current meds.

## 2012-02-04 NOTE — Telephone Encounter (Signed)
Refill request

## 2012-02-04 NOTE — Progress Notes (Signed)
  Subjective:    Patient ID: Brian Dominguez, male    DOB: 1946-08-15, 66 y.o.   MRN: [id number]  HPI 1. DM:  States DM is doing well.  Not checking glucose very often.  Taking metformin.  Walking some days and avoiding fried foods, sweets.    2. HTN:  Not checking BP at home.  He is taking his medications.  He has had some chest pain for the past few days.  Denies headache, nausea and vomiting, palpitations, weakness.   3. ED:  History of organic erectile dysfunction, has used viagra in the past but feels like it does not work well for him. He is interested in trying vacuum erection device.    4. Chest pain:  Has had chest pain for the past couple of days.  Has had cough for a couple of weeks and thinks pain is from coughing.  Also some dizziness when he has coughing spells.  Pain does not radiate.  Pain is on r side and sharp in nature, made worse when taking a deep breath.  Denies shortness of breath.    5. Shoulder pain:  Has continued shoulder pain, referred to ortho and told arthritis.  Using diclofenac and baclofen as needed.   Discussed with ortho about possible steroid injections, still thinking about this.    Review of Systems     Objective:   Physical Exam  Constitutional: He is oriented to person, place, and time. He appears well-nourished. No distress.  Cardiovascular: Normal rate, regular rhythm and normal heart sounds.   Pulmonary/Chest: Effort normal. He exhibits no tenderness.       Crackles in R base  Musculoskeletal: He exhibits no edema.       Still with significant amount of spasm in upper trapezius.  Spurlings positive for pain.  Decreased active rom at shoulder, 2/2 to pain.   Neurological: He is alert and oriented to person, place, and time.          Assessment & Plan:

## 2012-02-04 NOTE — Assessment & Plan Note (Signed)
Well controlled, A1c 6.7.  Advised to continue diet and exercise when possible.  Return 3 months.

## 2012-02-04 NOTE — Assessment & Plan Note (Addendum)
Atypical in nature, likely related to cough.   Do not think this is cardiac in nature.  Concern for pna vs bronchitis given exam and chronic cough.  Counseled on smoking cessation.  Will send for CXR.  EKG normal.

## 2012-02-04 NOTE — Assessment & Plan Note (Signed)
Would like to try vacuum erection device, Rx filled out for this.  PDE products do not work well for him.  Likely stems from HTN and DM.

## 2012-02-05 ENCOUNTER — Other Ambulatory Visit: Payer: Self-pay | Admitting: Family Medicine

## 2012-02-05 NOTE — Telephone Encounter (Signed)
Refill request

## 2012-03-28 ENCOUNTER — Other Ambulatory Visit: Payer: Self-pay | Admitting: Family Medicine

## 2012-04-11 ENCOUNTER — Other Ambulatory Visit: Payer: Self-pay | Admitting: Family Medicine

## 2012-05-16 ENCOUNTER — Other Ambulatory Visit: Payer: Self-pay | Admitting: Orthopedic Surgery

## 2012-05-21 ENCOUNTER — Encounter (HOSPITAL_COMMUNITY): Payer: Self-pay | Admitting: Pharmacy Technician

## 2012-05-23 ENCOUNTER — Encounter: Payer: Self-pay | Admitting: Family Medicine

## 2012-05-23 ENCOUNTER — Encounter (HOSPITAL_COMMUNITY): Payer: Self-pay

## 2012-05-23 ENCOUNTER — Ambulatory Visit (HOSPITAL_COMMUNITY)
Admission: RE | Admit: 2012-05-23 | Discharge: 2012-05-23 | Disposition: A | Discharge status: Home / Self Care | Payer: PRIVATE HEALTH INSURANCE | Source: Ambulatory Visit | Attending: Orthopedic Surgery | Admitting: Orthopedic Surgery

## 2012-05-23 ENCOUNTER — Encounter (HOSPITAL_COMMUNITY)
Admission: RE | Admit: 2012-05-23 | Discharge: 2012-05-23 | Disposition: A | Discharge status: Home / Self Care | Payer: PRIVATE HEALTH INSURANCE | Source: Ambulatory Visit | Attending: Orthopedic Surgery | Admitting: Orthopedic Surgery

## 2012-05-23 DIAGNOSIS — Z01818 Encounter for other preprocedural examination: Secondary | ICD-10-CM | POA: Insufficient documentation

## 2012-05-23 DIAGNOSIS — Z01811 Encounter for preprocedural respiratory examination: Secondary | ICD-10-CM | POA: Insufficient documentation

## 2012-05-23 DIAGNOSIS — Z01812 Encounter for preprocedural laboratory examination: Secondary | ICD-10-CM | POA: Insufficient documentation

## 2012-05-23 HISTORY — DX: Personal history of other diseases of the digestive system: Z87.19

## 2012-05-23 LAB — APTT: aPTT: 27 seconds (ref 24–37)

## 2012-05-23 LAB — URINALYSIS, ROUTINE W REFLEX MICROSCOPIC
Ketones, ur: 15 mg/dL — AB
Leukocytes, UA: NEGATIVE
Protein, ur: NEGATIVE mg/dL
Urobilinogen, UA: 0.2 mg/dL (ref 0.0–1.0)

## 2012-05-23 LAB — PROTIME-INR: INR: 0.97 (ref 0.00–1.49)

## 2012-05-23 LAB — TYPE AND SCREEN
ABO/RH(D): B POS
Antibody Screen: NEGATIVE

## 2012-05-23 LAB — COMPREHENSIVE METABOLIC PANEL
ALT: 21 U/L (ref 0–53)
Alkaline Phosphatase: 50 U/L (ref 39–117)
BUN: 8 mg/dL (ref 6–23)
CO2: 29 mEq/L (ref 19–32)
GFR calc Af Amer: 86 mL/min — ABNORMAL LOW (ref >90)
GFR calc non Af Amer: 74 mL/min — ABNORMAL LOW (ref >90)
Glucose, Bld: 168 mg/dL — ABNORMAL HIGH (ref 70–99)
Potassium: 4.7 mEq/L (ref 3.5–5.1)
Sodium: 141 mEq/L (ref 135–145)
Total Bilirubin: 0.4 mg/dL (ref 0.3–1.2)

## 2012-05-23 LAB — SURGICAL PCR SCREEN: Staphylococcus aureus: NEGATIVE

## 2012-05-23 LAB — DIFFERENTIAL
Lymphocytes Relative: 40 % (ref 12–46)
Lymphs Abs: 2.6 10*3/uL (ref 0.7–4.0)
Monocytes Absolute: 0.5 10*3/uL (ref 0.1–1.0)
Monocytes Relative: 8 % (ref 3–12)
Neutro Abs: 3 10*3/uL (ref 1.7–7.7)

## 2012-05-23 LAB — CBC
HCT: 44.1 % (ref 39.0–52.0)
Hemoglobin: 15.4 g/dL (ref 13.0–17.0)
MCH: 29.4 pg (ref 26.0–34.0)
RBC: 5.23 MIL/uL (ref 4.22–5.81)

## 2012-05-23 LAB — ABO/RH: ABO/RH(D): B POS

## 2012-05-23 NOTE — Pre-Procedure Instructions (Signed)
19 Brian Dominguez  05/23/2012   Your procedure is scheduled on:  June 05, 2012 0730 AM Thursday  Report to [facility] at 0530 AM.  Call this number if you have problems the morning of surgery: [phone]   Remember:   Do not eat food or drink:After Midnight. Wednsday      Take these medicines the morning of surgery with A SIP OF WATER: Cardura, Nexium, Lopressor and Ultram   Do not wear jewelry.  Do not wear lotions, powders, or perfumes. You may wear deodorant.  Do not shave 48 hours prior to surgery. Men may shave face and neck.  Do not bring valuables to the hospital.  Contacts, dentures or bridgework may not be worn into surgery.  Leave suitcase in the car. After surgery it may be brought to your room.  For patients admitted to the hospital, checkout time is 11:00 AM the day of discharge.   Patients discharged the day of surgery will not be allowed to drive home.           Special Instructions: Incentive Spirometry - Practice and bring it with you on the day of surgery. and CHG Shower Use Special Wash: 1/2 bottle night before surgery and 1/2 bottle morning of surgery.   Please read over the following fact sheets that you were given: Pain Booklet, Coughing and Deep Breathing, Blood Transfusion Information, MRSA Information and Surgical Site Infection Prevention

## 2012-05-23 NOTE — Progress Notes (Signed)
Pt BP was 152/100 on admission appt, repeated after labs drawn BP was 154/94. Stated he had taken his meds this AM.
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
## 2012-05-28 ENCOUNTER — Encounter: Payer: Self-pay | Admitting: Family Medicine

## 2012-05-28 ENCOUNTER — Ambulatory Visit (INDEPENDENT_AMBULATORY_CARE_PROVIDER_SITE_OTHER): Payer: PRIVATE HEALTH INSURANCE | Admitting: Family Medicine

## 2012-05-28 VITALS — BP 158/97 | HR 76 | Ht 73.0 in | Wt 264.0 lb

## 2012-05-28 DIAGNOSIS — Z01818 Encounter for other preprocedural examination: Secondary | ICD-10-CM

## 2012-05-28 DIAGNOSIS — E119 Type 2 diabetes mellitus without complications: Secondary | ICD-10-CM

## 2012-05-28 MED ORDER — METOPROLOL TARTRATE 100 MG PO TABS: 200.0000 mg | ORAL_TABLET | Freq: Two times a day (BID) | ORAL | Status: DC

## 2012-05-28 NOTE — Patient Instructions (Addendum)
Thank you for coming in today, it was good to see you I will send over a letter to your orthopedics office.  I hope your surgery goes well.  I will keep an eye out for paperwork for you to get another meter.  Increase your metoprolol to 2 tablets in the morning and at night.

## 2012-05-29 ENCOUNTER — Telehealth: Payer: Self-pay | Admitting: *Deleted

## 2012-05-29 NOTE — Telephone Encounter (Signed)
Given meter yesterday in clinic from our stock for medicare patients.  It has control solution with it.

## 2012-05-29 NOTE — Telephone Encounter (Signed)
[facility] calling because they received signed Rx for diabetic supplies, but meter and control solution was crossed out on order.  Patient does not have the meter and solution that will need to be used with the supplies they are sending.  Would like verbal order to give glucometer and control solution.  Will route note to Dr. Ashley Royalty and call back.  Gaylene Brooks, RN

## 2012-05-30 NOTE — Telephone Encounter (Signed)
Patient was given Contour glucometer.  Per [facility]---they use [employer] supplies and they are not compatible with the Contour glucometer.  [facility] will fax another form to our office for glucometer and control solution for Dr. Ashley Royalty to review and sign.  Gaylene Brooks, RN,

## 2012-06-01 DIAGNOSIS — Z01818 Encounter for other preprocedural examination: Secondary | ICD-10-CM | POA: Insufficient documentation

## 2012-06-01 NOTE — Progress Notes (Signed)
  Subjective:    Patient ID: Brian Dominguez, male    DOB: Apr 30, 1946, 66 y.o.   MRN: [id number]  HPI  1.  Surgical clearance:  Referred by ortho office for surgical clearance prior to anterior cervical discectomy with fusion.  Surgery scheduled for 6/27.  Had pre-op at hospital and EKG, CXR and labwork done.  He denies any chest pain,shortness of breath, palpitations.  No history of MI or prior stroke.    Review of Systems Per HPI    Objective:   Physical Exam  Constitutional: He is oriented to person, place, and time. He appears well-nourished.  Eyes: No scleral icterus.  Neck: Neck supple. No thyromegaly present.  Cardiovascular: Normal rate, regular rhythm and normal heart sounds.  Exam reveals no gallop and no friction rub.   No murmur heard. Pulmonary/Chest: Effort normal and breath sounds normal. No respiratory distress.  Abdominal: Soft. Bowel sounds are normal. He exhibits no distension. There is no tenderness.  Musculoskeletal: He exhibits no edema.  Neurological: He is alert and oriented to person, place, and time.          Assessment & Plan:

## 2012-06-01 NOTE — Assessment & Plan Note (Signed)
Scheduled for elective anterior cervical discectomy with fusion.  Using the RCRI he would have a score of 0.  He is diabetic and has HTN which does impart some degree of risk.  I have reviewed his recent EKG and CXR, and would place him in a low to intermediate risk for his upcoming surgical procedure.  His BP is still high today, will plan to increase metoprolol.

## 2012-06-04 MED ORDER — POVIDONE-IODINE 7.5 % EX SOLN
Freq: Once | CUTANEOUS | Status: DC
  Filled 2012-06-04: qty 118

## 2012-06-04 MED ORDER — CEFAZOLIN SODIUM-DEXTROSE 2-3 GM-% IV SOLR
2.0000 g | INTRAVENOUS | Status: AC
  Administered 2012-06-05: 2 g via INTRAVENOUS
  Filled 2012-06-04: qty 50

## 2012-06-05 ENCOUNTER — Encounter (HOSPITAL_COMMUNITY): Payer: Self-pay | Admitting: Vascular Surgery

## 2012-06-05 ENCOUNTER — Inpatient Hospital Stay (HOSPITAL_COMMUNITY): Payer: PRIVATE HEALTH INSURANCE | Admitting: Vascular Surgery

## 2012-06-05 ENCOUNTER — Encounter (HOSPITAL_COMMUNITY): Payer: Self-pay | Admitting: *Deleted

## 2012-06-05 ENCOUNTER — Inpatient Hospital Stay (HOSPITAL_COMMUNITY): Payer: PRIVATE HEALTH INSURANCE

## 2012-06-05 ENCOUNTER — Encounter (HOSPITAL_COMMUNITY)
Admission: RE | Disposition: A | Discharge status: Home / Self Care | Payer: Self-pay | Source: Ambulatory Visit | Attending: Orthopedic Surgery

## 2012-06-05 ENCOUNTER — Inpatient Hospital Stay (HOSPITAL_COMMUNITY)
Admission: RE | Admit: 2012-06-05 | Discharge: 2012-06-06 | DRG: 473 | Disposition: A | Discharge status: Home / Self Care | Payer: PRIVATE HEALTH INSURANCE | Source: Ambulatory Visit | Attending: Orthopedic Surgery | Admitting: Orthopedic Surgery

## 2012-06-05 DIAGNOSIS — M1990 Unspecified osteoarthritis, unspecified site: Secondary | ICD-10-CM | POA: Diagnosis present

## 2012-06-05 DIAGNOSIS — M5030 Other cervical disc degeneration, unspecified cervical region: Principal | ICD-10-CM | POA: Diagnosis present

## 2012-06-05 DIAGNOSIS — Q762 Congenital spondylolisthesis: Secondary | ICD-10-CM

## 2012-06-05 DIAGNOSIS — E119 Type 2 diabetes mellitus without complications: Secondary | ICD-10-CM | POA: Diagnosis present

## 2012-06-05 DIAGNOSIS — I10 Essential (primary) hypertension: Secondary | ICD-10-CM | POA: Diagnosis present

## 2012-06-05 DIAGNOSIS — F17200 Nicotine dependence, unspecified, uncomplicated: Secondary | ICD-10-CM | POA: Diagnosis present

## 2012-06-05 DIAGNOSIS — K219 Gastro-esophageal reflux disease without esophagitis: Secondary | ICD-10-CM | POA: Diagnosis present

## 2012-06-05 DIAGNOSIS — Z79899 Other long term (current) drug therapy: Secondary | ICD-10-CM

## 2012-06-05 DIAGNOSIS — K449 Diaphragmatic hernia without obstruction or gangrene: Secondary | ICD-10-CM | POA: Diagnosis present

## 2012-06-05 DIAGNOSIS — M542 Cervicalgia: Secondary | ICD-10-CM

## 2012-06-05 HISTORY — PX: ANTERIOR CERVICAL DECOMP/DISCECTOMY FUSION: SHX1161

## 2012-06-05 LAB — GLUCOSE, CAPILLARY
Glucose-Capillary: 177 mg/dL — ABNORMAL HIGH (ref 70–99)
Glucose-Capillary: 169 mg/dL — ABNORMAL HIGH (ref 70–99)

## 2012-06-05 SURGERY — ANTERIOR CERVICAL DECOMPRESSION/DISCECTOMY FUSION 3 LEVELS
Anesthesia: General | Site: Spine Cervical | Laterality: Left | Wound class: Clean

## 2012-06-05 MED ORDER — BUPIVACAINE-EPINEPHRINE 0.25% -1:200000 IJ SOLN
INTRAMUSCULAR | Status: DC | PRN
  Administered 2012-06-05: 2 mL

## 2012-06-05 MED ORDER — SODIUM CHLORIDE 0.9 % IJ SOLN: 3.0000 mL | INTRAMUSCULAR | Status: DC | PRN

## 2012-06-05 MED ORDER — ONDANSETRON HCL 4 MG/2ML IJ SOLN
INTRAMUSCULAR | Status: DC | PRN
  Administered 2012-06-05: 4 mg via INTRAVENOUS

## 2012-06-05 MED ORDER — ROCURONIUM BROMIDE 100 MG/10ML IV SOLN
INTRAVENOUS | Status: DC | PRN
  Administered 2012-06-05: 10 mg via INTRAVENOUS
  Administered 2012-06-05: 50 mg via INTRAVENOUS
  Administered 2012-06-05 (×4): 10 mg via INTRAVENOUS

## 2012-06-05 MED ORDER — ALUM & MAG HYDROXIDE-SIMETH 200-200-20 MG/5ML PO SUSP: 30.0000 mL | Freq: Four times a day (QID) | ORAL | Status: DC | PRN

## 2012-06-05 MED ORDER — 0.9 % SODIUM CHLORIDE (POUR BTL) OPTIME
TOPICAL | Status: DC | PRN
  Administered 2012-06-05: 1000 mL

## 2012-06-05 MED ORDER — LACTATED RINGERS IV SOLN
INTRAVENOUS | Status: DC | PRN
  Administered 2012-06-05 (×4): via INTRAVENOUS

## 2012-06-05 MED ORDER — MENTHOL 3 MG MT LOZG
1.0000 | LOZENGE | OROMUCOSAL | Status: DC | PRN
  Administered 2012-06-05: 3 mg via ORAL
  Filled 2012-06-05: qty 9

## 2012-06-05 MED ORDER — ZOLPIDEM TARTRATE 5 MG PO TABS: 5.0000 mg | ORAL_TABLET | Freq: Every evening | ORAL | Status: DC | PRN

## 2012-06-05 MED ORDER — MIDAZOLAM HCL 5 MG/5ML IJ SOLN
INTRAMUSCULAR | Status: DC | PRN
  Administered 2012-06-05: 2 mg via INTRAVENOUS

## 2012-06-05 MED ORDER — EPHEDRINE SULFATE 50 MG/ML IJ SOLN
INTRAMUSCULAR | Status: DC | PRN
  Administered 2012-06-05: 5 mg via INTRAVENOUS
  Administered 2012-06-05: 10 mg via INTRAVENOUS
  Administered 2012-06-05: 5 mg via INTRAVENOUS
  Administered 2012-06-05: 10 mg via INTRAVENOUS

## 2012-06-05 MED ORDER — NEOSTIGMINE METHYLSULFATE 1 MG/ML IJ SOLN
INTRAMUSCULAR | Status: DC | PRN
  Administered 2012-06-05: 3 mg via INTRAVENOUS

## 2012-06-05 MED ORDER — PANTOPRAZOLE SODIUM 40 MG PO TBEC
80.0000 mg | DELAYED_RELEASE_TABLET | Freq: Every day | ORAL | Status: DC
  Administered 2012-06-05: 80 mg via ORAL
  Filled 2012-06-05: qty 2

## 2012-06-05 MED ORDER — DIAZEPAM 5 MG PO TABS
5.0000 mg | ORAL_TABLET | Freq: Four times a day (QID) | ORAL | Status: DC | PRN
  Administered 2012-06-05 – 2012-06-06 (×2): 5 mg via ORAL
  Filled 2012-06-05 (×2): qty 1

## 2012-06-05 MED ORDER — THROMBIN 20000 UNITS EX KIT
PACK | CUTANEOUS | Status: DC | PRN
  Administered 2012-06-05: 09:00:00 via TOPICAL

## 2012-06-05 MED ORDER — POTASSIUM CHLORIDE IN NACL 20-0.9 MEQ/L-% IV SOLN
INTRAVENOUS | Status: DC
  Administered 2012-06-05: 19:00:00 via INTRAVENOUS
  Filled 2012-06-05 (×3): qty 1000

## 2012-06-05 MED ORDER — SENNA 8.6 MG PO TABS
1.0000 | ORAL_TABLET | Freq: Two times a day (BID) | ORAL | Status: DC
  Administered 2012-06-05 – 2012-06-06 (×2): 8.6 mg via ORAL
  Filled 2012-06-05 (×3): qty 1

## 2012-06-05 MED ORDER — HYDROMORPHONE HCL PF 1 MG/ML IJ SOLN
0.2500 mg | INTRAMUSCULAR | Status: DC | PRN
  Administered 2012-06-05 (×2): 0.5 mg via INTRAVENOUS

## 2012-06-05 MED ORDER — BUPIVACAINE-EPINEPHRINE PF 0.25-1:200000 % IJ SOLN
INTRAMUSCULAR | Status: AC
  Filled 2012-06-05: qty 30

## 2012-06-05 MED ORDER — ACETAMINOPHEN 325 MG PO TABS: 650.0000 mg | ORAL_TABLET | ORAL | Status: DC | PRN

## 2012-06-05 MED ORDER — ONDANSETRON HCL 4 MG/2ML IJ SOLN: 4.0000 mg | INTRAMUSCULAR | Status: DC | PRN

## 2012-06-05 MED ORDER — DOXAZOSIN MESYLATE 2 MG PO TABS
2.0000 mg | ORAL_TABLET | Freq: Every day | ORAL | Status: DC
  Administered 2012-06-05: 2 mg via ORAL
  Filled 2012-06-05 (×2): qty 1

## 2012-06-05 MED ORDER — DOCUSATE SODIUM 100 MG PO CAPS
100.0000 mg | ORAL_CAPSULE | Freq: Two times a day (BID) | ORAL | Status: DC
  Administered 2012-06-05 – 2012-06-06 (×3): 100 mg via ORAL
  Filled 2012-06-05 (×3): qty 1

## 2012-06-05 MED ORDER — FENTANYL CITRATE 0.05 MG/ML IJ SOLN
INTRAMUSCULAR | Status: DC | PRN
  Administered 2012-06-05 (×3): 50 ug via INTRAVENOUS
  Administered 2012-06-05: 150 ug via INTRAVENOUS
  Administered 2012-06-05 (×2): 50 ug via INTRAVENOUS

## 2012-06-05 MED ORDER — HYDROMORPHONE HCL PF 1 MG/ML IJ SOLN
INTRAMUSCULAR | Status: AC
  Filled 2012-06-05: qty 1

## 2012-06-05 MED ORDER — OXYCODONE-ACETAMINOPHEN 5-325 MG PO TABS
1.0000 | ORAL_TABLET | ORAL | Status: DC | PRN
  Administered 2012-06-05: 2 via ORAL
  Filled 2012-06-05: qty 2

## 2012-06-05 MED ORDER — DROPERIDOL 2.5 MG/ML IJ SOLN: 0.6250 mg | INTRAMUSCULAR | Status: DC | PRN

## 2012-06-05 MED ORDER — METFORMIN HCL 500 MG PO TABS
1000.0000 mg | ORAL_TABLET | Freq: Two times a day (BID) | ORAL | Status: DC
Start: 2012-06-05 — End: 2012-06-06
  Administered 2012-06-05 – 2012-06-06 (×2): 1000 mg via ORAL
  Filled 2012-06-05 (×4): qty 2

## 2012-06-05 MED ORDER — SODIUM CHLORIDE 0.9 % IJ SOLN
3.0000 mL | Freq: Two times a day (BID) | INTRAMUSCULAR | Status: DC
  Administered 2012-06-05 (×2): 3 mL via INTRAVENOUS

## 2012-06-05 MED ORDER — LIDOCAINE HCL 4 % MT SOLN
OROMUCOSAL | Status: DC | PRN
  Administered 2012-06-05: 4 mL via TOPICAL

## 2012-06-05 MED ORDER — GLYCOPYRROLATE 0.2 MG/ML IJ SOLN
INTRAMUSCULAR | Status: DC | PRN
  Administered 2012-06-05: 0.4 mg via INTRAVENOUS

## 2012-06-05 MED ORDER — PHENOL 1.4 % MT LIQD: 1.0000 | OROMUCOSAL | Status: DC | PRN

## 2012-06-05 MED ORDER — CEFAZOLIN SODIUM 1-5 GM-% IV SOLN
1.0000 g | Freq: Three times a day (TID) | INTRAVENOUS | Status: AC
  Administered 2012-06-05 (×2): 1 g via INTRAVENOUS
  Filled 2012-06-05 (×2): qty 50

## 2012-06-05 MED ORDER — MORPHINE SULFATE 2 MG/ML IJ SOLN
2.0000 mg | INTRAMUSCULAR | Status: DC | PRN
  Administered 2012-06-05: 2 mg via INTRAVENOUS
  Filled 2012-06-05: qty 1

## 2012-06-05 MED ORDER — PROPOFOL 10 MG/ML IV EMUL
INTRAVENOUS | Status: DC | PRN
  Administered 2012-06-05: 200 mg via INTRAVENOUS

## 2012-06-05 MED ORDER — THROMBIN 20000 UNITS EX SOLR
CUTANEOUS | Status: AC
  Filled 2012-06-05: qty 20000

## 2012-06-05 MED ORDER — ACETAMINOPHEN 650 MG RE SUPP: 650.0000 mg | RECTAL | Status: DC | PRN

## 2012-06-05 MED ORDER — METOPROLOL TARTRATE 100 MG PO TABS
200.0000 mg | ORAL_TABLET | Freq: Two times a day (BID) | ORAL | Status: DC
  Administered 2012-06-05 – 2012-06-06 (×2): 200 mg via ORAL
  Filled 2012-06-05 (×3): qty 2

## 2012-06-05 SURGICAL SUPPLY — 80 items
6MM Med Parallel (Orthopedic Implant) ×1 IMPLANT
APL SKNCLS STERI-STRIP NONHPOA (GAUZE/BANDAGES/DRESSINGS)
BENZOIN TINCTURE PRP APPL 2/3 (GAUZE/BANDAGES/DRESSINGS) ×1 IMPLANT
BIT DRILL NEURO 2X3.1 SFT TUCH (MISCELLANEOUS) ×1 IMPLANT
BLADE LONG MED 31X9 (MISCELLANEOUS) IMPLANT
BLADE SURG 15 STRL LF DISP TIS (BLADE) ×1 IMPLANT
BLADE SURG 15 STRL SS (BLADE) ×2
BLADE SURG ROTATE 9660 (MISCELLANEOUS) ×2 IMPLANT
BUR MATCHSTICK NEURO 3.0 LAGG (BURR) ×2 IMPLANT
CARTRIDGE OIL MAESTRO DRILL (MISCELLANEOUS) ×1 IMPLANT
CLOTH BEACON ORANGE TIMEOUT ST (SAFETY) ×2 IMPLANT
CLSR STERI-STRIP ANTIMIC 1/2X4 (GAUZE/BANDAGES/DRESSINGS) ×1 IMPLANT
COLLAR CERV LO CONTOUR FIRM DE (SOFTGOODS) IMPLANT
CORDS BIPOLAR (ELECTRODE) ×2 IMPLANT
COVER SURGICAL LIGHT HANDLE (MISCELLANEOUS) ×2 IMPLANT
CRADLE DONUT ADULT HEAD (MISCELLANEOUS) ×2 IMPLANT
DEVICE ENDSKLTN CRVCL 5MM-0SM (Orthopedic Implant) IMPLANT
DIFFUSER DRILL AIR PNEUMATIC (MISCELLANEOUS) ×2 IMPLANT
DRAIN JACKSON RD 7FR 3/32 (WOUND CARE) IMPLANT
DRAPE C-ARM 42X72 X-RAY (DRAPES) ×2 IMPLANT
DRAPE POUCH INSTRU U-SHP 10X18 (DRAPES) ×2 IMPLANT
DRAPE SURG 17X23 STRL (DRAPES) ×7 IMPLANT
DRILL NEURO 2X3.1 SOFT TOUCH (MISCELLANEOUS) ×2
DURAPREP 26ML APPLICATOR (WOUND CARE) ×1 IMPLANT
ELECT COATED BLADE 2.86 ST (ELECTRODE) ×2 IMPLANT
ELECT REM PT RETURN 9FT ADLT (ELECTROSURGICAL) ×2
ELECTRODE REM PT RTRN 9FT ADLT (ELECTROSURGICAL) ×1 IMPLANT
ENDOSKELETON CERVICAL 5MM-0SM (Orthopedic Implant) ×2 IMPLANT
EVACUATOR SILICONE 100CC (DRAIN) IMPLANT
GAUZE SPONGE 4X4 16PLY XRAY LF (GAUZE/BANDAGES/DRESSINGS) ×2 IMPLANT
GLOVE BIO SURGEON STRL SZ8 (GLOVE) ×2 IMPLANT
GLOVE BIOGEL M 7.0 STRL (GLOVE) ×1 IMPLANT
GLOVE BIOGEL PI IND STRL 7.5 (GLOVE) IMPLANT
GLOVE BIOGEL PI IND STRL 8 (GLOVE) ×1 IMPLANT
GLOVE BIOGEL PI INDICATOR 7.5 (GLOVE) ×1
GLOVE BIOGEL PI INDICATOR 8 (GLOVE) ×1
GLOVE SURG SIGNA 7.5 PF LTX (GLOVE) ×1 IMPLANT
GLOVE SURG SS PI 7.0 STRL IVOR (GLOVE) ×1 IMPLANT
GOWN STRL NON-REIN LRG LVL3 (GOWN DISPOSABLE) ×2 IMPLANT
GOWN STRL REIN XL XLG (GOWN DISPOSABLE) ×3 IMPLANT
INTERLOCK LRDTC CRVCL VBR 7MM (Bone Implant) IMPLANT
IV CATH 14GX2 1/4 (CATHETERS) ×2 IMPLANT
KIT BASIN OR (CUSTOM PROCEDURE TRAY) ×2 IMPLANT
KIT ROOM TURNOVER OR (KITS) ×2 IMPLANT
LORDOTIC CERVICAL VBR 7MM SM (Bone Implant) ×2 IMPLANT
MANIFOLD NEPTUNE II (INSTRUMENTS) ×2 IMPLANT
NDL 18GX1X1/2 (RX/OR ONLY) (NEEDLE) IMPLANT
NDL SPNL 20GX3.5 QUINCKE YW (NEEDLE) ×1 IMPLANT
NEEDLE 18GX1X1/2 (RX/OR ONLY) (NEEDLE) ×2 IMPLANT
NEEDLE 27GAX1X1/2 (NEEDLE) ×2 IMPLANT
NEEDLE SPNL 20GX3.5 QUINCKE YW (NEEDLE) ×2 IMPLANT
NS IRRIG 1000ML POUR BTL (IV SOLUTION) ×2 IMPLANT
OIL CARTRIDGE MAESTRO DRILL (MISCELLANEOUS) ×2
PACK ORTHO CERVICAL (CUSTOM PROCEDURE TRAY) ×2 IMPLANT
PAD ARMBOARD 7.5X6 YLW CONV (MISCELLANEOUS) ×4 IMPLANT
PATTIES SURGICAL .5 X.5 (GAUZE/BANDAGES/DRESSINGS) IMPLANT
PATTIES SURGICAL .5 X1 (DISPOSABLE) ×1 IMPLANT
PIN DISTRACTION 14 (PIN) ×2 IMPLANT
PIN DISTRACTION 14MM (PIN) IMPLANT
PLATE VECTRA 48MM (Plate) ×1 IMPLANT
SCREW 4.0X16MM (Screw) ×8 IMPLANT
SPONGE GAUZE 4X4 12PLY (GAUZE/BANDAGES/DRESSINGS) ×2 IMPLANT
SPONGE INTESTINAL PEANUT (DISPOSABLE) ×2 IMPLANT
SPONGE SURGIFOAM ABS GEL 100 (HEMOSTASIS) ×1 IMPLANT
STRIP CLOSURE SKIN 1/2X4 (GAUZE/BANDAGES/DRESSINGS) ×2 IMPLANT
SURGIFLO TRUKIT (HEMOSTASIS) IMPLANT
SUT MNCRL AB 4-0 PS2 18 (SUTURE) ×1 IMPLANT
SUT SILK 4 0 (SUTURE)
SUT SILK 4-0 18XBRD TIE 12 (SUTURE) IMPLANT
SUT VIC AB 2-0 CT2 18 VCP726D (SUTURE) ×2 IMPLANT
SYR BULB IRRIGATION 50ML (SYRINGE) ×2 IMPLANT
SYR CONTROL 10ML LL (SYRINGE) ×3 IMPLANT
TAPE CLOTH 4X10 WHT NS (GAUZE/BANDAGES/DRESSINGS) ×2 IMPLANT
TAPE CLOTH SURG 4X10 WHT LF (GAUZE/BANDAGES/DRESSINGS) ×1 IMPLANT
TAPE UMBILICAL COTTON 1/8X30 (MISCELLANEOUS) ×3 IMPLANT
TOWEL OR 17X24 6PK STRL BLUE (TOWEL DISPOSABLE) ×2 IMPLANT
TOWEL OR 17X26 10 PK STRL BLUE (TOWEL DISPOSABLE) ×2 IMPLANT
TRAY FOLEY CATH 14FR (SET/KITS/TRAYS/PACK) ×2 IMPLANT
WATER STERILE IRR 1000ML POUR (IV SOLUTION) ×1 IMPLANT
YANKAUER SUCT BULB TIP NO VENT (SUCTIONS) ×2 IMPLANT

## 2012-06-05 NOTE — Plan of Care (Signed)
Problem: Consults Goal: Diagnosis - Spinal Surgery Outcome: Completed/Met Date Met:  06/05/12 Cervical Spine Fusion

## 2012-06-05 NOTE — Transfer of Care (Signed)
Immediate Anesthesia Transfer of Care Note  Patient: Brian Dominguez  Procedure(s) Performed: Procedure(s) (LRB): ANTERIOR CERVICAL DECOMPRESSION/DISCECTOMY FUSION 3 LEVELS (Left)  Patient Location: PACU  Anesthesia Type: General  Level of Consciousness: sedated  Airway & Oxygen Therapy: Patient Spontanous Breathing and Patient connected to face mask oxygen  Post-op Assessment: Report given to PACU RN, Post -op Vital signs reviewed and stable and Patient moving all extremities  Post vital signs: Reviewed and stable  Complications: No apparent anesthesia complications

## 2012-06-05 NOTE — Preoperative (Signed)
Beta Blockers   Reason not to administer Beta Blockers:Not Applicable 
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
## 2012-06-05 NOTE — Anesthesia Preprocedure Evaluation (Addendum)
Anesthesia Evaluation  Patient identified by MRN, date of birth, ID band Patient awake    Reviewed: Allergy & Precautions, H&P , NPO status , Patient's Chart, lab work & pertinent test results, reviewed documented beta blocker date and time   History of Anesthesia Complications Negative for: history of anesthetic complications  Airway Mallampati: I TM Distance: >3 FB Neck ROM: Full    Dental  (+) Upper Dentures, Partial Lower, Missing and Dental Advisory Given   Pulmonary Current Smoker,  breath sounds clear to auscultation  Pulmonary exam normal       Cardiovascular hypertension, Pt. on home beta blockers Rhythm:Regular Rate:Normal     Neuro/Psych Anxiety Depression    GI/Hepatic hiatal hernia, GERD-  Controlled and Medicated,  Endo/Other  Diabetes mellitus-, Type 2, Oral Hypoglycemic Agents  Renal/GU      Musculoskeletal   Abdominal   Peds  Hematology   Anesthesia Other Findings   Reproductive/Obstetrics                          Anesthesia Physical Anesthesia Plan  ASA: III  Anesthesia Plan: General   Post-op Pain Management:    Induction: Intravenous  Airway Management Planned: Oral ETT  Additional Equipment:   Intra-op Plan:   Post-operative Plan: Extubation in OR  Informed Consent: I have reviewed the patients History and Physical, chart, labs and discussed the procedure including the risks, benefits and alternatives for the proposed anesthesia with the patient or authorized representative who has indicated his/her understanding and acceptance.   Dental advisory given  Plan Discussed with: CRNA, Anesthesiologist and Surgeon  Anesthesia Plan Comments:         Anesthesia Quick Evaluation

## 2012-06-06 ENCOUNTER — Encounter (HOSPITAL_COMMUNITY): Payer: Self-pay | Admitting: Orthopedic Surgery

## 2012-06-06 LAB — GLUCOSE, CAPILLARY: Glucose-Capillary: 168 mg/dL — ABNORMAL HIGH (ref 70–99)

## 2012-06-06 NOTE — Anesthesia Postprocedure Evaluation (Signed)
Anesthesia Post Note  Patient: Brian Dominguez  Procedure(s) Performed: Procedure(s) (LRB): ANTERIOR CERVICAL DECOMPRESSION/DISCECTOMY FUSION 3 LEVELS (Left)  Anesthesia type: general  Patient location: PACU  Post pain: Pain level controlled  Post assessment: Patient's Cardiovascular Status Stable  Last Vitals:  Filed Vitals:   06/06/12 0806  BP: 153/88  Pulse: 88  Temp: 37.4 C  Resp: 16    Post vital signs: Reviewed and stable  Level of consciousness: sedated  Complications: No apparent anesthesia complications

## 2012-06-06 NOTE — Op Note (Signed)
NAME:  Brian Dominguez, CASEBEER NO.:  [id number]  MEDICAL RECORD NO.:  [id number]  LOCATION:  3526                         FACILITY:  [facility]  PHYSICIAN:  Estill Bamberg, MD      DATE OF BIRTH:  05-Oct-1946  DATE OF PROCEDURE:  06/05/2012 DATE OF DISCHARGE:                              OPERATIVE REPORT   PREOPERATIVE DIAGNOSES: 1. Left-sided cervical radiculopathy. 2. Severe and profound degenerative disk disease, C4-5, C5-6, C6-7. 3. Anterolisthesis, C4-5.  POSTOPERATIVE DIAGNOSES: 1. Left-sided cervical radiculopathy. 2. Severe and profound degenerative disk disease, C4-5, C5-6, C6-7. 3. Anterolisthesis, C4-5.  PROCEDURES: 1. Anterior cervical decompression and fusion, C4-5, C5-6, C6-7. 2. Insertion of interbody device x3 (Titan interbody cages). 3. Placement of anterior instrumentation, C4 to C7. 4. Use of local autograft. 5. Use of morselized allograft.  SURGEON:  Estill Bamberg, M.D.  ASSISTANT:  Skip Mayer, PA-C.  ANESTHESIA:  General endotracheal anesthesia.  COMPLICATIONS:  None.  DISPOSITION:  Stable.  ESTIMATED BLOOD LOSS:  Minimal.  INDICATIONS FOR PROCEDURE:  Briefly, Mr. Krist is a 66 year old male who presents to me with severe debilitating pain in his left arm.  The pain was very much radicular in nature and it did travel down his arm. I did initially evaluate the patient on May 16, 2012.  At that point in time, the patient has had physical therapy as well as epidural injections.  The patient was very clear and stating that his epidural injections did entirely alleviate his symptoms, but only temporary basis.  I did review an MRI, which was notable for profound and severe degenerative disk disease at C4-5, C5-6, and C6-7.  There was also noted to be varying degrees of neuroforaminal stenosis on the left side. Given the patient's ongoing severe debilitating pain, we did have a discussion regarding going forward with an anterior  cervical decompression and fusion from C4 to C7.  Of note, the patient is diabetic and is a smoker.  He does understand that this does increase his risk for a nonunion as well as other complications including infection.  OPERATIVE DETAILS:  On June 05, 2012, the patient was brought to the Surgery and general endotracheal anesthesia was administered.  The patient was placed supine on a well-padded hospital bed.  The arms were secured to the patient's sides.  The ulnar nerves were protected bilaterally and padded.  SCDs were placed.  I did bring in lateral fluoroscopy to help Hien Cunliffe out and locate the optimal location for his transverse incision.  The neck was then prepped and draped in the usual sterile fashion and antibiotics were given.  I then made a transverse incision from the midline to the medial border of sternocleidomastoid muscle.  The platysma was sharply incised.  The plane between the sternocleidomastoid muscle laterally and strap muscles medially was readily identified and explored.  The anterior cervical spine was readily noted.  Of note, there were severe profound osteophytes noted at the C5-6 and C6-7 levels, and there was noted to be a significant anterolisthesis at the C4-5 level.  I did remove the osteophytes using a rongeur.  They were placed on the back table.  I then turned my attention  toward the C6-7 interspace and a self-retaining Shadow-Line retractor was placed.  I again continue to remove osteophytes anteriorly.  It should be noted at this level, it was extremely difficult and very challenging to get into the intervertebral space, given the significant osteophytes and significant collapse.  I did place Caspar pins into the C6 and C7 vertebral bodies and distraction was applied.  This did help to a certain extent, but again, it was extremely difficult and very challenging to get to the posterior longitudinal ligament.  Normally, the diskectomy portion of the  procedure takes approximately 10-15 minutes, whereas in this case, it took approximately 45 minutes, given the severe and profound osteophytes, severe and profound collapse.  I was ultimately able to gain access to the posterior longitudinal ligament and performed a thorough neuroforaminal decompression on the left side.  The endplates were prepared and I did make a decision to select a 7-mm small Titan spacer.  This was packed with DBX in addition to autograft obtained from removing the osteophytes and this was tamped into position under distraction.  I then removed the Caspar pins from the C7 vertebral body and bone wax was placed in its place.  I then turned my attention toward the C5-6 interspace and a diskectomy was performed in the manner as described previously.  Again, the diskectomy at this particular level was again noted to be extremely difficult and challenging.  Again, there were significant osteophytes noted above and below the interspace.  The osteophytes were liberally taken down using a rongeur in addition to a bur.  Distraction was again applied and I did ultimately go forward with a diskectomy and the posterolateral ligament was again encountered and entered, and a neuroforaminal decompression was performed on the left side.  Again, the diskectomy portion of the procedure took approximately 45 minutes given the significant osteophytes noted, whereas it normally takes 10-15. Again, I selected a 7-mm Titan plain that was packed with autograft and allograft and tamped into position.  I then turned my attention toward the C4-5 interspace.  Again, this was also a rather difficult level given the anterolisthesis previously noted.  I did however performed diskectomy in the manner as described previously.  The posterior longitudinal ligament was again entered and a thorough neuroforaminal decompression was performed.  At this particular level, I did make a decision to choose a  7-mm lordotic Titan interbody spacer.  This was again packed with allograft and autograft, tamped into position.  I then chose an appropriately sized Vectra plate.  This was placed over the anterior cervical spine.  Again, I needed to continue to remove osteophytes in order to place the plate safely and appropriately.  I was however able to secure the plate to the anterior spine.  I did obtain AP and lateral fluoroscopic views to confirm appropriate positioning of the plate.  I then placed 16 mm self-drilling, self-tapping, variable-angle screws, 2 in each vertebral body for a total of 8 screws.  I was very happy with the final press fit of each of the screws.  Again, I did use fluoroscopy while placing the screws.  I was very happy with the final appearance of the interbody implants on the plate.  At this point, the wound was copiously irrigated.  The platysma was closed using 2-0 Vicryl and the skin was closed using 4-0 Monocryl.  Benzoin and Steri-Strips were placed, followed by sterile dressing.  Of note, Skip Mayer was my assistant throughout the procedure and aided in  essential retraction and suctioning required throughout the surgery.     Estill Bamberg, MD     MD/MEDQ  D:  06/05/2012  T:  06/06/2012  Job:  [id number]  cc:   Everrett Coombe, MD

## 2012-06-06 NOTE — Anesthesia Postprocedure Evaluation (Signed)
Patient discharged.

## 2012-06-06 NOTE — Progress Notes (Signed)
Patient reports resolved left arm pain.  Doing well.  BP 146/75  Pulse 78  Temp 98.9 F (37.2 C) (Oral)  Resp 18  SpO2 98%  NVI Dressing CDI  POD #1 after C4-C7 acdf  - aspen collar at all times - d/c home, follow-up 2 weeks

## 2012-06-09 NOTE — Discharge Summary (Signed)
NAME:  Brian Dominguez, Brian Dominguez NO.:  [id number]  MEDICAL RECORD NO.:  [id number]  LOCATION:  3526                         FACILITY:  [facility]  PHYSICIAN:  Estill Bamberg, MD      DATE OF BIRTH:  06/08/1946  DATE OF ADMISSION:  06/05/2012 DATE OF DISCHARGE:  06/06/2012                              DISCHARGE SUMMARY   ADMITTING PHYSICIAN:  Estill Bamberg, MD  ADMISSION DIAGNOSES: 1. Left-sided cervical radiculopathy. 2. Profound degenerative disk disease at C4-5, C5-6, C6-7.  ADMISSION HISTORY:  Briefly, Mr. Molstad is a 66 year old male who presented to my office with severe and debilitating pain in his left arm.  Radiographs and MRI were consistent with profound degenerative disk disease at C4-5, C5-6 and C6-7, and left cervical radiculopathy was also noted.  The patient failed conservative care and we did have a discussion regarding going forward with a three-level ACDF.  The patient was therefore admitted on June 05, 2012, for the procedure reflected above.  HOSPITAL COURSE:  On March 05, 2012, the patient was brought to the Surgery and underwent the procedure noted above.  The patient tolerated the procedure well and was transferred to recovery in stable condition. The patient was evaluated by me in the morning of postoperative day #1. The patient's left arm pain was resolved.  The patient was comfortable and tolerating a general diet and was uneventfully discharged home on the morning of postoperative day #1.  DISCHARGE INSTRUCTIONS:  The patient will take Percocet for pain and Valium for spasms.  The patient will follow up in my office at approximately 2 weeks after his procedure.  The patient wear an [place] cervical collar at all times and he was given a [place] collar to be use while showering.     Estill Bamberg, MD     MD/MEDQ  D:  06/09/2012  T:  06/09/2012  Job:  [id number]

## 2012-07-18 ENCOUNTER — Other Ambulatory Visit: Payer: Self-pay | Admitting: Family Medicine

## 2012-07-21 ENCOUNTER — Encounter: Payer: Self-pay | Admitting: Family Medicine

## 2012-07-21 NOTE — Telephone Encounter (Deleted)
This encounter was created in error - please disregard.

## 2012-07-23 ENCOUNTER — Other Ambulatory Visit: Payer: Self-pay | Admitting: Family Medicine

## 2012-08-20 ENCOUNTER — Telehealth: Payer: Self-pay | Admitting: *Deleted

## 2012-08-20 DIAGNOSIS — E119 Type 2 diabetes mellitus without complications: Secondary | ICD-10-CM

## 2012-08-20 MED ORDER — GLUCOSE BLOOD VI STRP: ORAL_STRIP | Status: DC

## 2012-08-20 NOTE — Telephone Encounter (Signed)
Received fax from [facility] requesting Rx for Contour Test Strips that go with Mr. Nebel meter.  Rx sent.  Ileana Ladd

## 2012-08-25 ENCOUNTER — Telehealth: Payer: Self-pay | Admitting: Family Medicine

## 2012-08-25 DIAGNOSIS — E119 Type 2 diabetes mellitus without complications: Secondary | ICD-10-CM

## 2012-08-25 MED ORDER — GLUCOSE BLOOD VI STRP: ORAL_STRIP | Status: DC

## 2012-08-25 NOTE — Telephone Encounter (Signed)
Pt states that the [facility] pharmacy still hasn't gotten refill on his test strips - pls advise

## 2012-08-25 NOTE — Telephone Encounter (Signed)
Refill keeps changing to Print Mode.  Contour test strips called in to [facility]. Ileana Ladd

## 2012-08-25 NOTE — Telephone Encounter (Signed)
Rx for test strips was on print mode.  Resent Test Strip Rx to [facility] on normal mode.  Ileana Ladd

## 2012-10-14 ENCOUNTER — Other Ambulatory Visit: Payer: Self-pay | Admitting: Family Medicine

## 2012-11-03 ENCOUNTER — Other Ambulatory Visit: Payer: Self-pay | Admitting: *Deleted

## 2012-11-03 MED ORDER — METFORMIN HCL 1000 MG PO TABS: 1000.0000 mg | ORAL_TABLET | Freq: Two times a day (BID) | ORAL | Status: DC

## 2012-12-10 HISTORY — PX: OTHER SURGICAL HISTORY: SHX169

## 2012-12-12 ENCOUNTER — Other Ambulatory Visit: Payer: Self-pay | Admitting: Family Medicine

## 2013-02-12 ENCOUNTER — Ambulatory Visit (INDEPENDENT_AMBULATORY_CARE_PROVIDER_SITE_OTHER): Payer: PRIVATE HEALTH INSURANCE | Admitting: Family Medicine

## 2013-02-12 ENCOUNTER — Encounter: Payer: Self-pay | Admitting: Family Medicine

## 2013-02-12 VITALS — BP 167/102 | HR 80 | Temp 98.5°F | Ht 73.0 in | Wt 261.0 lb

## 2013-02-12 DIAGNOSIS — I10 Essential (primary) hypertension: Secondary | ICD-10-CM

## 2013-02-12 DIAGNOSIS — M25512 Pain in left shoulder: Secondary | ICD-10-CM

## 2013-02-12 DIAGNOSIS — Z7189 Other specified counseling: Secondary | ICD-10-CM

## 2013-02-12 DIAGNOSIS — F17200 Nicotine dependence, unspecified, uncomplicated: Secondary | ICD-10-CM

## 2013-02-12 DIAGNOSIS — Z716 Tobacco abuse counseling: Secondary | ICD-10-CM

## 2013-02-12 DIAGNOSIS — M25519 Pain in unspecified shoulder: Secondary | ICD-10-CM

## 2013-02-12 DIAGNOSIS — K219 Gastro-esophageal reflux disease without esophagitis: Secondary | ICD-10-CM

## 2013-02-12 DIAGNOSIS — E119 Type 2 diabetes mellitus without complications: Secondary | ICD-10-CM

## 2013-02-12 LAB — COMPREHENSIVE METABOLIC PANEL
ALT: 23 U/L (ref 0–53)
Albumin: 4.7 g/dL (ref 3.5–5.2)
CO2: 28 mEq/L (ref 19–32)
Glucose, Bld: 170 mg/dL — ABNORMAL HIGH (ref 70–99)
Potassium: 4.5 mEq/L (ref 3.5–5.3)
Sodium: 138 mEq/L (ref 135–145)
Total Bilirubin: 0.6 mg/dL (ref 0.3–1.2)
Total Protein: 7.1 g/dL (ref 6.0–8.3)

## 2013-02-12 LAB — POCT GLYCOSYLATED HEMOGLOBIN (HGB A1C): Hemoglobin A1C: 8.1

## 2013-02-12 MED ORDER — METHYLPREDNISOLONE ACETATE 40 MG/ML IJ SUSP
40.0000 mg | Freq: Once | INTRAMUSCULAR | Status: AC
  Administered 2013-02-12: 40 mg via INTRA_ARTICULAR

## 2013-02-12 MED ORDER — AMLODIPINE BESYLATE 10 MG PO TABS: 10.0000 mg | ORAL_TABLET | Freq: Every day | ORAL | Status: DC

## 2013-02-12 NOTE — Patient Instructions (Addendum)
General Instructions: I am going to add an additional medication for your blood pressure called amlodipine.  Follow up with me in 4 weeks.  I want you to increase your nexium to two times per day Set a quit date for yourself.  I would like for you to make an appointment with our pharmacist, Dr. Raymondo Band to discuss smoking cessation I will send your chart to our health coach who will contact you.    Treatment Goals:  Goals (1 Years of Data) as of 02/12/13         As of Today 06/06/12 06/06/12 06/05/12 06/05/12     Blood Pressure    . Blood Pressure < 130/80  167/102 153/88 146/75 150/73 155/75     Lifestyle    . Quit smoking / using tobacco           Result Component    . HEMOGLOBIN A1C < 7.0            Progress Toward Treatment Goals:  Treatment Goal 02/12/2013  Blood pressure unchanged  Stop smoking smoking less    Self Care Goals & Plans:  Self Care Goal 02/12/2013  Manage my medications bring my medications to every visit  Monitor my health keep track of my blood glucose; bring my glucose meter and log to each visit; keep track of my blood pressure; keep track of my weight; check my feet daily  Eat healthy foods eat more vegetables; eat fruit for snacks and desserts  Be physically active find an activity I enjoy; find a convenient safe place to exercise; take a walk every day  Stop smoking go to the [employer] ([employer]); call [facility] (1-800-QUIT-NOW); set a quit date and stop smoking    Home Blood Glucose Monitoring 02/12/2013  Check my blood sugar once a day  When to check my blood sugar before breakfast     Care Management & Community Referrals:  Referral 02/12/2013  Referrals made for care management support pharmacy clinic; other (see comment)  Referrals made to community resources other (see comments)

## 2013-02-13 LAB — CBC
MCH: 28.3 pg (ref 26.0–34.0)
MCV: 81.9 fL (ref 78.0–100.0)
Platelets: 305 10*3/uL (ref 150–400)
RDW: 14.9 % (ref 11.5–15.5)
WBC: 6.5 10*3/uL (ref 4.0–10.5)

## 2013-02-14 ENCOUNTER — Other Ambulatory Visit: Payer: Self-pay | Admitting: Family Medicine

## 2013-02-15 NOTE — Assessment & Plan Note (Signed)
L shoulder pain consistent with impingment, possibly with bursitis.   L shoulder injected today.Reconcile dispenses show he is receiving flexeril and vicodin from somewhere, unclear where this is as I didn't noticed until after he left.  I will see where he is getting this from at this follow up appt.

## 2013-02-15 NOTE — Assessment & Plan Note (Signed)
He is willing to quit smoking, discussed with him today.  Instructed to follow up at pharmacy clinic.  Given number to [employer] quit line

## 2013-02-15 NOTE — Assessment & Plan Note (Signed)
Still with reflux symptoms on PPI.  Will have him double PPI for now and try to obtain records from previous endoscopy at Dr. Rolla Etienne office.  Of note in reconcile dispenses it appears he is receiving meloxicam as well.  Last rx written by me for this was 07/2011.  Will have him stop this.

## 2013-02-15 NOTE — Assessment & Plan Note (Signed)
Glycemic control may be worsening based on fasting sugars.  Will check A1c today.  He is willing to work on increased exercise and diet to improve his glucose.
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
## 2013-02-15 NOTE — Assessment & Plan Note (Addendum)
BP elevated, currently on two agents.  No red flag symptoms today.  Will add on amlodipine, f/u in 4 weeks to recheck bp

## 2013-02-16 ENCOUNTER — Telehealth: Payer: Self-pay | Admitting: *Deleted

## 2013-02-16 NOTE — Telephone Encounter (Signed)
Called pt and informed. He said, that he does not take meloxicam, does not even know what it is. I also informed him of his labs. He c/o still having nausea, I advised him to schedule OV with [place]. He also reports, that the endoscopy with Dr.Hung came back negative. I told him, that we need a report (ROI) Pt agreed. Fwd. To PCP for info .Arlyss Repress

## 2013-02-16 NOTE — Telephone Encounter (Signed)
Message copied by Arlyss Repress on Mon Feb 16, 2013  9:13 AM ------      Message from: Everrett Coombe      Created: Sun Feb 15, 2013 11:07 PM       Please try to obtain endoscopy report from Dr. Rolla Etienne office.  Also appears he is using meloxicam.  Please have him stop this as it may be causing his GI upset.            Thanks ------

## 2013-03-18 ENCOUNTER — Other Ambulatory Visit: Payer: Self-pay | Admitting: *Deleted

## 2013-03-18 MED ORDER — METFORMIN HCL 1000 MG PO TABS: 1000.0000 mg | ORAL_TABLET | Freq: Two times a day (BID) | ORAL | Status: DC

## 2013-05-06 ENCOUNTER — Telehealth: Payer: Self-pay | Admitting: *Deleted

## 2013-05-06 DIAGNOSIS — J302 Other seasonal allergic rhinitis: Secondary | ICD-10-CM

## 2013-05-06 MED ORDER — CETIRIZINE HCL 10 MG PO TABS: 10.0000 mg | ORAL_TABLET | Freq: Every day | ORAL | Status: DC

## 2013-05-06 NOTE — Telephone Encounter (Signed)
Refill sent in

## 2013-05-06 NOTE — Telephone Encounter (Signed)
Pt is requesting refill for cetirizine  - not on MAR and is available OTC - please advise.

## 2013-05-06 NOTE — Addendum Note (Signed)
Addended by: Everrett Coombe on: 05/06/2013 01:30 PM   Modules accepted: Orders

## 2013-05-11 ENCOUNTER — Telehealth: Payer: Self-pay | Admitting: Family Medicine

## 2013-05-11 ENCOUNTER — Other Ambulatory Visit: Payer: Self-pay | Admitting: Family Medicine

## 2013-05-11 MED ORDER — SILDENAFIL CITRATE 100 MG PO TABS: 100.0000 mg | ORAL_TABLET | ORAL | Status: DC | PRN

## 2013-05-11 NOTE — Telephone Encounter (Signed)
Pt wants a prescription for Viagra. [facility]

## 2013-05-11 NOTE — Telephone Encounter (Signed)
Will FWD to MD for approval.  Aubryanna Nesheim L, CMA  

## 2013-06-08 ENCOUNTER — Encounter: Payer: Self-pay | Admitting: Family Medicine

## 2013-06-09 ENCOUNTER — Encounter: Payer: Self-pay | Admitting: Family Medicine

## 2013-06-09 ENCOUNTER — Ambulatory Visit (INDEPENDENT_AMBULATORY_CARE_PROVIDER_SITE_OTHER): Payer: PRIVATE HEALTH INSURANCE | Admitting: Family Medicine

## 2013-06-09 ENCOUNTER — Telehealth: Payer: Self-pay | Admitting: *Deleted

## 2013-06-09 VITALS — BP 160/97 | HR 74 | Temp 98.4°F | Ht 73.0 in | Wt 254.4 lb

## 2013-06-09 DIAGNOSIS — I10 Essential (primary) hypertension: Secondary | ICD-10-CM

## 2013-06-09 DIAGNOSIS — Z716 Tobacco abuse counseling: Secondary | ICD-10-CM

## 2013-06-09 DIAGNOSIS — E119 Type 2 diabetes mellitus without complications: Secondary | ICD-10-CM

## 2013-06-09 DIAGNOSIS — N411 Chronic prostatitis: Secondary | ICD-10-CM | POA: Insufficient documentation

## 2013-06-09 DIAGNOSIS — F17200 Nicotine dependence, unspecified, uncomplicated: Secondary | ICD-10-CM

## 2013-06-09 DIAGNOSIS — R300 Dysuria: Secondary | ICD-10-CM

## 2013-06-09 DIAGNOSIS — Z7189 Other specified counseling: Secondary | ICD-10-CM

## 2013-06-09 LAB — POCT URINALYSIS DIPSTICK
Blood, UA: NEGATIVE
Glucose, UA: NEGATIVE
Leukocytes, UA: NEGATIVE
Nitrite, UA: NEGATIVE
Urobilinogen, UA: 0.2
pH, UA: 5.5

## 2013-06-09 MED ORDER — CIPROFLOXACIN HCL 500 MG PO TABS: 500.0000 mg | ORAL_TABLET | Freq: Two times a day (BID) | ORAL | Status: AC

## 2013-06-09 MED ORDER — BLOOD GLUCOSE METER KIT: PACK | Status: DC

## 2013-06-09 MED ORDER — GLUCOSE BLOOD VI STRP: ORAL_STRIP | Status: DC

## 2013-06-09 NOTE — Progress Notes (Signed)
This encounter was created in error - please disregard.

## 2013-06-09 NOTE — Telephone Encounter (Signed)
Fax is requesting onetouch test strips Rx and lancets Rx - please include quantity , directions and diagnosis code. Wyatt Haste, RN-BSN

## 2013-06-09 NOTE — Assessment & Plan Note (Signed)
Past history of chronic prostatitis, with prolonged course of antibiotics about 3 years ago. No longer follows Urology. Recent urinary symptoms of dysuria, frequency, nocturia worsening for 1 month. Last PSA 1.46 (2011). Started on Cipro 500mg  BID x6 weeks empirically for chronic prostatitis. Urine dipstick shows negative for nitrites, leukocytes, red blood cells, bacteria. Less likely to be UTI vs. Urethritis, although patient is sexually active, possible STI etiology of urethritis is possible, and should be strongly considered if no improvement at follow-up in 2-3 weeks.

## 2013-06-09 NOTE — Assessment & Plan Note (Signed)
Patient currently smoking less than 0.3ppd, continued interest in quitting and is aware of available resources. Encourage future follow-up with pharmacy clinic if still having difficulty quitting.

## 2013-06-09 NOTE — Patient Instructions (Addendum)
Thank you for coming in to the clinic today. It was great to meet you!  We would like you to follow-up at a return appointment to the clinic in 2-3 weeks to see your primary doctor about your Blood Pressure and see if your urinary symptoms are improving.  I am going to prescribe you the antibiotic Cipro, and would like you to take it two times a day for a total of 6 weeks (start today July 1 end August 12). Your urine specimen looked clean today, and if you are having these symptoms it is likely to be an infection within your prostate. You should start to feel better in 1-2 weeks, but please CONTINUE taking the medication for 6 weeks.  Your blood pressure was still significantly elevated today. I would like you to follow-up with your primary doctor in 2-3 weeks to re-check your blood pressure. I am also going to increase your Cardura dose from 2mg  to 4mg  daily, so please take 2 tablets each day instead of just 1. This will help your blood pressure and may also improve your urination.  If you have any other concerns please call the clinic, and if needed please go immediately to the ED.  Thank you!  Prostatitis Prostatitis is redness, soreness, and puffiness (swelling) of the prostate gland. The prostate gland is the walnut-sized gland located just below your bladder. HOME CARE:   Take all medicines as told by your doctor.  Take warm-water baths (sitz baths) as told by your doctor. GET HELP RIGHT AWAY IF:   You have chills.  You feel sick to your stomach (nauseous) or like you will throw up (vomit).  You feel lightheaded or like you will pass out (faint).  You are unable to pee (urinate).  You have blood or blood clumps (clots) in your pee (urine).  Your symptoms get worse, not better.  You have a fever. MAKE SURE YOU:  Understand these instructions.  Will watch your condition.  Will get help right away if you are not doing well or get worse. Document Released: 05/27/2012  Document Reviewed: 05/27/2012 [facility] Patient Information 2014 [place], [place].

## 2013-06-09 NOTE — Assessment & Plan Note (Signed)
BP continues to be elevated today. Reports compliance with all BP meds, on 4 agents. Due to concomitant worsening of prostatic urinary symptoms, increased Cardura from 2mg  at bedtime to 4mg  at bedtime (instructed patient to take 2 tablets, instead of 1). Despite 4 agent therapy, BP does not appear to be responding appropriately. Possible still issue of compliance. Scheduled to follow-up in 2-3 weeks to re-check BP and focus on medication compliance and effectiveness of increased Cardura.

## 2013-06-09 NOTE — Telephone Encounter (Signed)
Patient was in clinic today seen by Dr. Candyce Churn and a prescription was sent in to [facility] for glucose meter and test strips.  Ileana Ladd

## 2013-06-09 NOTE — Progress Notes (Signed)
Subjective:     Patient ID: Brian Dominguez, male   DOB: [birth year], 67 y.o.   MRN: [id number]  HPI  DYSURIA Duration - 1 month with every urination, reports prior hx in past 3 years of shorter duration, reports hx of "prostate infection" treated with prolonged course of antibiotics, no longer followed by Urology Frequency - increased     Nocturia - 4-5x nightly, worsened 1 month Hematuria - denies     Flank pain - denies     Fever - denies, admits to occasional night sweat Discharge - denies     Sexually active - yes  HYPERTENSION Disease Monitoring - initial BP today 175/99 - re-checked at 160/97 Home BP Monitoring - none Chest pain- none    Dyspnea- none Medications - recently added Amlodipine 10mg  daily. See medication list Compliance-  Reports good med compliance. Lightheadedness - denies  Edema- denies  DIABETES Disease Monitoring: Blood Sugar ranges- <200 Polyuria/phagia/dipsia- increased polyuria      Visual problems- none Medications: currently on Metformin 1000mg  twice daily Compliance- good Hypoglycemic symptoms- none   - Reports that he needs glucometer and test strips at home.   PMH Smoking status - current smoker 0.3ppd, reports recent reduction, plans to continue to try and quit   Lab Review   Potassium  Date Value Range Status  02/12/2013 4.5  3.5 - 5.3 mEq/L Final     Sodium  Date Value Range Status  02/12/2013 138  135 - 145 mEq/L Final     Creat  Date Value Range Status  02/12/2013 0.99  0.50 - 1.35 mg/dL Final     Creatinine, Ser  Date Value Range Status  05/23/2012 1.02  0.50 - 1.35 mg/dL Final           Review of Systems  See above HPI.     Objective:   Physical Exam    BP 160/97  Pulse 74  Temp(Src) 98.4 F (36.9 C) (Oral)  Ht 6\' 1"  (1.854 m)  Wt 254 lb 6.4 oz (115.395 kg)  BMI 33.57 kg/m2  General - Well developed, well nourished, appears stated age, NAD HEENT - MMM, pharynx clear without erythema Heart - Regular rate and rhythm.   No murmurs, gallops or rubs.    Lungs - Normal respiratory effort, chest expands symmetrically. Lungs are clear to auscultation, no crackles or wheezes Abdomen - abdomen soft and non-tender without masses, organomegaly or hernias noted.  No guarding or rebound.  MSK - no CVA tenderness Skin:  Intact without suspicious lesions or rashes

## 2013-06-09 NOTE — Assessment & Plan Note (Signed)
Today HgA1c 7.5, which shows improved glycemic control from last visit (8.1, 02/2013). No change to treatment. Continue Metformin 1000mg  BID. Encouraged patient to continue walking, exercise routine, and dietary modifications. Ordered glucometer and test strips.

## 2013-06-11 ENCOUNTER — Other Ambulatory Visit: Payer: Self-pay | Admitting: *Deleted

## 2013-06-11 DIAGNOSIS — E119 Type 2 diabetes mellitus without complications: Secondary | ICD-10-CM

## 2013-06-11 MED ORDER — GLUCOSE BLOOD VI STRP: ORAL_STRIP | Status: DC

## 2013-06-30 ENCOUNTER — Ambulatory Visit: Payer: PRIVATE HEALTH INSURANCE | Admitting: Family Medicine

## 2013-07-10 ENCOUNTER — Emergency Department (HOSPITAL_COMMUNITY)
Admission: EM | Admit: 2013-07-10 | Discharge: 2013-07-10 | Disposition: A | Discharge status: Home / Self Care | Payer: PRIVATE HEALTH INSURANCE | Attending: Emergency Medicine | Admitting: Emergency Medicine

## 2013-07-10 ENCOUNTER — Encounter (HOSPITAL_COMMUNITY): Payer: Self-pay

## 2013-07-10 DIAGNOSIS — I10 Essential (primary) hypertension: Secondary | ICD-10-CM | POA: Insufficient documentation

## 2013-07-10 DIAGNOSIS — Z8739 Personal history of other diseases of the musculoskeletal system and connective tissue: Secondary | ICD-10-CM | POA: Insufficient documentation

## 2013-07-10 DIAGNOSIS — R350 Frequency of micturition: Secondary | ICD-10-CM | POA: Insufficient documentation

## 2013-07-10 DIAGNOSIS — Z79899 Other long term (current) drug therapy: Secondary | ICD-10-CM | POA: Insufficient documentation

## 2013-07-10 DIAGNOSIS — F17200 Nicotine dependence, unspecified, uncomplicated: Secondary | ICD-10-CM | POA: Insufficient documentation

## 2013-07-10 DIAGNOSIS — Z87828 Personal history of other (healed) physical injury and trauma: Secondary | ICD-10-CM | POA: Insufficient documentation

## 2013-07-10 DIAGNOSIS — E119 Type 2 diabetes mellitus without complications: Secondary | ICD-10-CM | POA: Insufficient documentation

## 2013-07-10 DIAGNOSIS — R1013 Epigastric pain: Secondary | ICD-10-CM

## 2013-07-10 DIAGNOSIS — Z8719 Personal history of other diseases of the digestive system: Secondary | ICD-10-CM | POA: Insufficient documentation

## 2013-07-10 DIAGNOSIS — R197 Diarrhea, unspecified: Secondary | ICD-10-CM | POA: Insufficient documentation

## 2013-07-10 DIAGNOSIS — R300 Dysuria: Secondary | ICD-10-CM | POA: Insufficient documentation

## 2013-07-10 DIAGNOSIS — Z792 Long term (current) use of antibiotics: Secondary | ICD-10-CM | POA: Insufficient documentation

## 2013-07-10 DIAGNOSIS — Z87448 Personal history of other diseases of urinary system: Secondary | ICD-10-CM | POA: Insufficient documentation

## 2013-07-10 DIAGNOSIS — Z8619 Personal history of other infectious and parasitic diseases: Secondary | ICD-10-CM | POA: Insufficient documentation

## 2013-07-10 DIAGNOSIS — R110 Nausea: Secondary | ICD-10-CM | POA: Insufficient documentation

## 2013-07-10 LAB — COMPREHENSIVE METABOLIC PANEL
AST: 16 U/L (ref 0–37)
Albumin: 4.1 g/dL (ref 3.5–5.2)
Alkaline Phosphatase: 57 U/L (ref 39–117)
Chloride: 101 mEq/L (ref 96–112)
Potassium: 4.9 mEq/L (ref 3.5–5.1)
Sodium: 140 mEq/L (ref 135–145)
Total Bilirubin: 0.3 mg/dL (ref 0.3–1.2)
Total Protein: 7.3 g/dL (ref 6.0–8.3)

## 2013-07-10 LAB — URINALYSIS, ROUTINE W REFLEX MICROSCOPIC
Glucose, UA: NEGATIVE mg/dL
Leukocytes, UA: NEGATIVE
pH: 7 (ref 5.0–8.0)

## 2013-07-10 LAB — CBC WITH DIFFERENTIAL/PLATELET
Basophils Absolute: 0 10*3/uL (ref 0.0–0.1)
Basophils Relative: 0 % (ref 0–1)
Eosinophils Absolute: 0.2 10*3/uL (ref 0.0–0.7)
Hemoglobin: 16.1 g/dL (ref 13.0–17.0)
MCHC: 35.9 g/dL (ref 30.0–36.0)
Neutro Abs: 2.9 10*3/uL (ref 1.7–7.7)
Neutrophils Relative %: 50 % (ref 43–77)
Platelets: 265 10*3/uL (ref 150–400)
RDW: 13.8 % (ref 11.5–15.5)

## 2013-07-10 MED ORDER — GI COCKTAIL ~~LOC~~
30.0000 mL | Freq: Once | ORAL | Status: AC
  Administered 2013-07-10: 30 mL via ORAL
  Filled 2013-07-10: qty 30

## 2013-07-10 MED ORDER — ONDANSETRON 4 MG PO TBDP
4.0000 mg | ORAL_TABLET | Freq: Once | ORAL | Status: AC
  Administered 2013-07-10: 4 mg via ORAL
  Filled 2013-07-10: qty 1

## 2013-07-10 MED ORDER — ONDANSETRON HCL 4 MG PO TABS: 4.0000 mg | ORAL_TABLET | Freq: Three times a day (TID) | ORAL | Status: DC | PRN

## 2013-07-10 MED ORDER — RANITIDINE HCL 150 MG PO TABS: 150.0000 mg | ORAL_TABLET | Freq: Two times a day (BID) | ORAL | Status: DC

## 2013-07-10 NOTE — ED Notes (Signed)
Pt. Urinated without any difficulty.  Pt. Would like something to help him pass gas.  Pt. Medicated per orders.  Gi cocktail given.

## 2013-07-10 NOTE — ED Notes (Signed)
[place] given

## 2013-07-10 NOTE — ED Notes (Signed)
Breakfast tray ordered 

## 2013-07-10 NOTE — ED Notes (Signed)
Pt. Reports that he began taking antibiotics 5 days ago for a Prostate infection and has developed pain on his rt. Side and feel bloated.  Pt. Reports it feels like burning and has some loose stools.  Denies any vomiting , does feel nausea.
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
## 2013-07-11 LAB — URINE CULTURE: Colony Count: NO GROWTH

## 2013-07-19 ENCOUNTER — Other Ambulatory Visit: Payer: Self-pay | Admitting: Family Medicine

## 2013-07-20 ENCOUNTER — Ambulatory Visit: Payer: PRIVATE HEALTH INSURANCE | Admitting: Family Medicine

## 2013-08-05 ENCOUNTER — Other Ambulatory Visit: Payer: Self-pay | Admitting: Family Medicine

## 2013-08-05 ENCOUNTER — Telehealth: Payer: Self-pay | Admitting: *Deleted

## 2013-08-05 DIAGNOSIS — E119 Type 2 diabetes mellitus without complications: Secondary | ICD-10-CM

## 2013-08-05 MED ORDER — GLUCOSE BLOOD VI STRP: ORAL_STRIP | Status: DC

## 2013-08-05 NOTE — Telephone Encounter (Signed)
Rx sent to pharmacy   

## 2013-08-05 NOTE — Telephone Encounter (Signed)
Please send new script with dx code and testing bid  With signature to preferred pharm Wyatt Haste, RN-BSN

## 2013-08-06 ENCOUNTER — Telehealth: Payer: Self-pay | Admitting: Family Medicine

## 2013-08-06 NOTE — Telephone Encounter (Signed)
Please fax hand written script with number of times testing and dx code to [place] [facility] [address] [employer], RN-BSN

## 2013-08-06 NOTE — Telephone Encounter (Signed)
Script hand written and sent to [facility] at [employer]. Please let the patient know.

## 2013-08-06 NOTE — Telephone Encounter (Signed)
Pt called and would like a refill of his diabetic strips [facility] the [facility] on [address]. The previous pharmacy can no longer fill them there. JW

## 2013-08-07 ENCOUNTER — Other Ambulatory Visit: Payer: Self-pay | Admitting: Family Medicine

## 2013-08-07 DIAGNOSIS — E119 Type 2 diabetes mellitus without complications: Secondary | ICD-10-CM

## 2013-08-07 NOTE — Telephone Encounter (Signed)
Attempted to call pt - no answer [name] Rayshard Schirtzinger, RN-BSN  

## 2013-08-13 ENCOUNTER — Telehealth: Payer: Self-pay | Admitting: *Deleted

## 2013-08-13 NOTE — Telephone Encounter (Signed)
Called patient and told him he will need an appointment for any refills, he will call back to the front desk to make an appointment.Richell Corker, Rodena Medin

## 2013-08-13 NOTE — Telephone Encounter (Signed)
Received a call from the pharmacy, [facility] on [address], patient requesting a refill on Cipro. Will forward to PCP for request.Busick, Rodena Medin

## 2013-08-13 NOTE — Telephone Encounter (Signed)
Patient needs follow up before more antibiotics will be given. I suggest a same day appointment tomorrow.

## 2013-08-14 ENCOUNTER — Encounter: Payer: Self-pay | Admitting: Family Medicine

## 2013-08-14 ENCOUNTER — Ambulatory Visit (INDEPENDENT_AMBULATORY_CARE_PROVIDER_SITE_OTHER): Payer: PRIVATE HEALTH INSURANCE | Admitting: Family Medicine

## 2013-08-14 VITALS — BP 154/86 | HR 71 | Temp 97.6°F | Wt 255.0 lb

## 2013-08-14 DIAGNOSIS — N529 Male erectile dysfunction, unspecified: Secondary | ICD-10-CM

## 2013-08-14 DIAGNOSIS — R300 Dysuria: Secondary | ICD-10-CM

## 2013-08-14 DIAGNOSIS — N411 Chronic prostatitis: Secondary | ICD-10-CM

## 2013-08-14 LAB — POCT URINALYSIS DIPSTICK
Bilirubin, UA: NEGATIVE
Blood, UA: NEGATIVE
Glucose, UA: 500
Nitrite, UA: NEGATIVE
Spec Grav, UA: >=1.03

## 2013-08-14 MED ORDER — CELECOXIB 100 MG PO CAPS: 100.0000 mg | ORAL_CAPSULE | Freq: Two times a day (BID) | ORAL | Status: DC

## 2013-08-14 MED ORDER — SILDENAFIL CITRATE 100 MG PO TABS: 100.0000 mg | ORAL_TABLET | ORAL | Status: DC | PRN
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
## 2013-08-14 NOTE — Patient Instructions (Signed)
Nice to meet you. Please use the celecoxib for the pain associated with your prostate. I do not believe you have an infection, though we will see what your urine studies show. I have given you a refill of viagra. If you develop chest pain and have to call EMS regarding this you need to let them know that you are taking this. Please also record your blood pressure daily and bring the recordings to follow up with Dr. Clinton Sawyer.

## 2013-08-16 ENCOUNTER — Encounter: Payer: Self-pay | Admitting: Family Medicine

## 2013-08-16 LAB — URINE CULTURE: Colony Count: 25000

## 2013-08-16 NOTE — Assessment & Plan Note (Addendum)
Patient with continued dysuria despite treatment with antibiotics for 6 weeks. Had interval UA and UCx and STI testing that make infection less likely. Additionally UA was unremarkable from this visit and urine culture had 25,000 colonies of multiple morphotypes making infection less likely. I suspect he has chronic inflammation of the prostate and per up-to-date's recommendations many people with this that do not respond to antibiotics and medications such as doxazosin often benefit from addition of a COX-2 inhibitor. Will give trial of celecoxib for the next 10 days and ask that patient follow-up in the next 2 weeks to ensure improvement in symptoms with this treatment. I spent greater than 25 minutes in the care of this patient and >50% of this was in counseling this patient in the issues discussed in the office visit.

## 2013-08-16 NOTE — Assessment & Plan Note (Addendum)
Viagra has proved beneficial to the patient. Refilled for patient. Advised that if patient were to develop chest pain while taking these he would need to inform EMS prior to delivery of nitroglycerin products. Patient voiced understanding.

## 2013-08-25 ENCOUNTER — Other Ambulatory Visit: Payer: Self-pay | Admitting: Family Medicine

## 2013-08-27 ENCOUNTER — Telehealth: Payer: Self-pay | Admitting: Family Medicine

## 2013-08-27 NOTE — Telephone Encounter (Signed)
Pt called because he is out of test strips. He said that [facility] has been requesting this and no response from [place]. He is out of strips and need this right away so he can check his sugar levels. JW

## 2013-08-27 NOTE — Telephone Encounter (Signed)
Please tell the patient that I electronically sent and faxed a hand written prescription for these strips to [facility] pharmacy on 08/05/13 and 08/06/13. Since that time, I  have not received any correspondence from [facility] about this issue. I will give the patient a hand written prescription on Friday when he presents to clinic. If he does not show for his office visit, then I will leave a hand written prescription for him to personally take to the pharmacy.

## 2013-08-27 NOTE — Telephone Encounter (Signed)
Will fwd to Md.  Tatumn Corbridge L, CMA  

## 2013-08-28 ENCOUNTER — Encounter: Payer: Self-pay | Admitting: Family Medicine

## 2013-08-28 ENCOUNTER — Ambulatory Visit (INDEPENDENT_AMBULATORY_CARE_PROVIDER_SITE_OTHER): Payer: PRIVATE HEALTH INSURANCE | Admitting: Family Medicine

## 2013-08-28 VITALS — BP 163/94 | HR 71 | Ht 73.0 in | Wt 253.0 lb

## 2013-08-28 DIAGNOSIS — E119 Type 2 diabetes mellitus without complications: Secondary | ICD-10-CM

## 2013-08-28 DIAGNOSIS — K219 Gastro-esophageal reflux disease without esophagitis: Secondary | ICD-10-CM

## 2013-08-28 MED ORDER — RANITIDINE HCL 150 MG PO TABS: 150.0000 mg | ORAL_TABLET | Freq: Two times a day (BID) | ORAL | Status: DC

## 2013-08-28 NOTE — Patient Instructions (Signed)
  Dear Mr. Brooke Bonito  Thank you for coming to clinic today. Please read below regarding the issues that we discussed.   1. Acid Reflux - For this you need to start back on the Zantac and Nexium. Take the Nexium in the morning, one hour before eating. Also take one Zantac tablet in the morning and one at night. We will get records from Dr. Elnoria Howard about the endoscopy. Read below about a good diet.   2. Blood Sugar Test Strips - Please take my prescription to the pharmacy.   Please follow up in clinic in 2 weeks. Please call earlier if you have any questions or concerns.   Sincerely,   Dr. Clinton Sawyer   Diet for Gastroesophageal Reflux Disease, Adult Reflux (acid reflux) is when acid from your stomach flows up into the esophagus. When acid comes in contact with the esophagus, the acid causes irritation and soreness (inflammation) in the esophagus. When reflux happens often or so severely that it causes damage to the esophagus, it is called gastroesophageal reflux disease (GERD). Nutrition therapy can help ease the discomfort of GERD. FOODS OR DRINKS TO AVOID OR LIMIT  Smoking or chewing tobacco. Nicotine is one of the most potent stimulants to acid production in the gastrointestinal tract.  Caffeinated and decaffeinated coffee and black tea.  Regular or low-calorie carbonated beverages or energy drinks (caffeine-free carbonated beverages are allowed).   Strong spices, such as black pepper, white pepper, red pepper, cayenne, curry powder, and chili powder.  Peppermint or spearmint.  Chocolate.  High-fat foods, including meats and fried foods. Extra added fats including oils, butter, salad dressings, and nuts. Limit these to less than 8 tsp per day.  Fruits and vegetables if they are not tolerated, such as citrus fruits or tomatoes.  Alcohol.  Any food that seems to aggravate your condition. If you have questions regarding your diet, call your caregiver or a registered dietitian. OTHER  THINGS THAT MAY HELP GERD INCLUDE:   Eating your meals slowly, in a relaxed setting.  Eating 5 to 6 small meals per day instead of 3 large meals.  Eliminating food for a period of time if it causes distress.  Not lying down until 3 hours after eating a meal.  Keeping the head of your bed raised 6 to 9 inches (15 to 23 cm) by using a foam wedge or blocks under the legs of the bed. Lying flat may make symptoms worse.  Being physically active. Weight loss may be helpful in reducing reflux in overweight or obese adults.  Wear loose fitting clothing EXAMPLE MEAL PLAN This meal plan is approximately 2,000 calories based on https://www.bernard.org/ meal planning guidelines. Breakfast   cup cooked oatmeal.  1 cup strawberries.  1 cup low-fat milk.  1 oz almonds. Snack  1 cup cucumber slices.  6 oz yogurt (made from low-fat or fat-free milk). Lunch  2 slice whole-wheat bread.  2 oz sliced [place].  2 tsp mayonnaise.  1 cup blueberries.  1 cup snap peas. Snack  6 whole-wheat crackers.  1 oz string cheese. Dinner   cup brown rice.  1 cup mixed veggies.  1 tsp olive oil.  3 oz grilled fish. Document Released: 11/26/2005 Document Revised: 02/18/2012 Document Reviewed: 10/12/2011 [facility] Patient Information 2014 [place], [place].

## 2013-08-28 NOTE — Progress Notes (Signed)
  Subjective:    Patient ID: Brian Dominguez, male    DOB: 1946/07/15, 67 y.o.   MRN: [id number]  HPI  67 year old male with history of gastroesophageal reflux disease, hiatal hernia status post repair, and H. Pylori infection in 1999 who presents for evaluation of reflux. According to the patient, his reflux is manifested through pain in his upper abdomen below his xiphoid. It does not radiate. He says that it is constant. He does not know if it is related to his diet, but says he'll start to look out for that. He says he's had this problem for "years." He is prescribed ranitidine and Nexium, but is not taking either. When asked if the medications help, he says "I'm not sure but I guess they do." the patient also notes that last year he went to a doctor who performed an endoscopy. He cannot return to the doctor's name, but states that the doctor "didn't see nothing causing a problem." he is not taking any ibuprofen or Advil, but was recently prescribed Celebrex for chronic prostatitis.  Past medical history:  Hiatal hernia status post repair, year unknown H. Pylori infection 1999    Review of Systems Denies chest pain, shortness of breath, vomiting, weight loss    Objective:   Physical Exam BP 163/94  Pulse 71  Ht 6\' 1"  (1.854 m)  Wt 253 lb (114.76 kg)  BMI 33.39 kg/m2  Gen. Elderly African American male, well appearing, obese OP: clear and moist Cardiovascular: regular rate and rhythm, no murmurs rubs or gallops Lungs: normal breathing, clear to auscultation Abdomen: nondistended, nontender, 3 well-healed incisions, normoactive bowel sounds       Assessment & Plan:

## 2013-08-29 NOTE — Assessment & Plan Note (Addendum)
Assessment: poorly controlled GERD due to non compliance with medication and little knowledge about appropriate diet; Pt notes history of hiatal hernia surgery repair and per patient had an endoscopy last year that was negative which lowers my concern for other causes like esophageal carcinoma or ZE syndrome; may have H. Pylori but cannot perform urea breath test today b/c patient had bismuth 4 days ago (2 week window required) Plan: restarted PPI and H2 Blocker, try to obtain records from Dr. Haywood Pao office; f/u in 2 weeks

## 2013-08-29 NOTE — Assessment & Plan Note (Signed)
Prescription given for glucose test strips

## 2013-09-16 ENCOUNTER — Ambulatory Visit (HOSPITAL_COMMUNITY)
Admission: RE | Admit: 2013-09-16 | Discharge: 2013-09-16 | Disposition: A | Discharge status: Home / Self Care | Payer: PRIVATE HEALTH INSURANCE | Source: Ambulatory Visit | Attending: Family Medicine | Admitting: Family Medicine

## 2013-09-16 ENCOUNTER — Encounter: Payer: Self-pay | Admitting: Family Medicine

## 2013-09-16 ENCOUNTER — Ambulatory Visit (INDEPENDENT_AMBULATORY_CARE_PROVIDER_SITE_OTHER): Payer: PRIVATE HEALTH INSURANCE | Admitting: Family Medicine

## 2013-09-16 VITALS — BP 167/100 | HR 66 | Ht 74.0 in | Wt 257.0 lb

## 2013-09-16 DIAGNOSIS — B3781 Candidal esophagitis: Secondary | ICD-10-CM

## 2013-09-16 DIAGNOSIS — R0789 Other chest pain: Secondary | ICD-10-CM | POA: Insufficient documentation

## 2013-09-16 DIAGNOSIS — Z87898 Personal history of other specified conditions: Secondary | ICD-10-CM

## 2013-09-16 DIAGNOSIS — E119 Type 2 diabetes mellitus without complications: Secondary | ICD-10-CM

## 2013-09-16 DIAGNOSIS — N529 Male erectile dysfunction, unspecified: Secondary | ICD-10-CM

## 2013-09-16 DIAGNOSIS — Z23 Encounter for immunization: Secondary | ICD-10-CM

## 2013-09-16 DIAGNOSIS — I10 Essential (primary) hypertension: Secondary | ICD-10-CM

## 2013-09-16 DIAGNOSIS — R079 Chest pain, unspecified: Secondary | ICD-10-CM

## 2013-09-16 DIAGNOSIS — K219 Gastro-esophageal reflux disease without esophagitis: Secondary | ICD-10-CM

## 2013-09-16 DIAGNOSIS — R9431 Abnormal electrocardiogram [ECG] [EKG]: Secondary | ICD-10-CM | POA: Insufficient documentation

## 2013-09-16 DIAGNOSIS — N400 Enlarged prostate without lower urinary tract symptoms: Secondary | ICD-10-CM

## 2013-09-16 LAB — POCT GLYCOSYLATED HEMOGLOBIN (HGB A1C): Hemoglobin A1C: 7.4

## 2013-09-16 MED ORDER — AMLODIPINE BESYLATE 10 MG PO TABS: 10.0000 mg | ORAL_TABLET | Freq: Every day | ORAL | Status: DC

## 2013-09-16 MED ORDER — FLUCONAZOLE 200 MG PO TABS: 400.0000 mg | ORAL_TABLET | Freq: Every day | ORAL | Status: DC

## 2013-09-16 MED ORDER — DOXAZOSIN MESYLATE 2 MG PO TABS: 4.0000 mg | ORAL_TABLET | Freq: Every day | ORAL | Status: DC

## 2013-09-16 NOTE — Patient Instructions (Signed)
Dear Mr. Ressel,   It was nice to see you today. Please refer about issues that we discussed.  1. Acid reflux: we will try a medication to treat a yeast infection of her throat for one month to see if this helps. If not, then we will change your diabetes medication.   2. High blood pressure: Please come back next week and bring all your medications so we can discuss her blood pressure  Your EKG looked normal today.  Sincerely,   Dr. Clinton Sawyer

## 2013-09-16 NOTE — Progress Notes (Signed)
Patient ID: Brian Dominguez, male   DOB: Apr 15, 1946, 67 y.o.   MRN: [id number]  Subjective:    1. GERD - restarted on ranitidine and omeprazole and patient notes no improvement; I received and reviewed records from Dr. Elnoria Howard gastroenterologist, with the patient who noted that upper endoscopy in 2012 showed only mild candidal esophagitis and no other reason for symptoms so patient started on treatment which provided no relief; thereafter, patient started on sucralfate to go with PPI, which did not help; lastly patient sent for manometry and ph measuring but patient never followed up  Today, [name] denies any improvement on ranitidine and omeprazole and also notes that he has not changed his diet at all; he is worried that he may have a cardiac problem or candida again; the pain is constant and located in his epigastrium, it does not radiate, not associated with nausea or vomiting   2. Hypertension  Home BP monitoring: none  Office BP: BP Readings from Last 3 Encounters:  09/16/13 167/100  08/28/13 163/94  08/14/13 154/86    Prescribed meds: amlodipine, HCTZ, metoprolol, cardura  Hypertension ROS:  Taking medications as prescribed:No - patient only taking metoprolol and none of the others, he cannot state when or why he stopped other blood pressure medications, but is concerned that metoprolol is causing sexual dysfunction  Chest pain: No Shortness of breath: No Swelling of extremities: No TIA symptoms: No   Erectile Dysfunction: Patient notes worsening problem getting and maintaining erection for several months, he attributes is to metoprolol, cause of frustration with patient  Review of Systems:  Pertinent items are noted in HPI.     Objective:   Physical Exam: BP 167/100  Pulse 66  Ht 6\' 2"  (1.88 m)  Wt 257 lb (116.574 kg)  BMI 32.98 kg/m2  General: obese, elderly AAM, not health appearing but not acutely ill Cardiovascular: RRR, nl S1 and S2 Pulm: CTA-B Abd: mild  pain, non distended   Date: 09/16/13  Rate: 67  Rhythm: normal sinus rhythm  QRS Axis: normal  Intervals: normal  ST/T Wave abnormalities: nonspecific T wave changes  Conduction Disutrbances:none  Narrative Interpretation: no evidence of active ischemia or past infarct   Old EKG Reviewed: changes noted    Assessment & Plan:

## 2013-09-18 NOTE — Assessment & Plan Note (Signed)
Possibly related to metoprolol so stop for now and use other anti-HTN meds

## 2013-09-18 NOTE — Assessment & Plan Note (Signed)
Assessment: still uncertain of exact gauze if actually GERD given evaluation from Dr. Elnoria Howard and lack of improvement despite numerous treatment modalities and hx of surgery; assured today that it was nice cardiac Plan: treat for candida esophagitis for 3 weeks, if not improved then stop Metformin to see if this helps

## 2013-09-18 NOTE — Assessment & Plan Note (Signed)
Patient to restart cardura

## 2013-09-18 NOTE — Assessment & Plan Note (Signed)
Assessment: non compliance with medication Plan: restarted amlodipine and Cardura, hold metoprolol given possible side effect

## 2013-09-30 ENCOUNTER — Ambulatory Visit: Payer: PRIVATE HEALTH INSURANCE | Admitting: Family Medicine

## 2013-10-06 ENCOUNTER — Encounter: Payer: Self-pay | Admitting: Family Medicine

## 2013-10-06 ENCOUNTER — Ambulatory Visit (INDEPENDENT_AMBULATORY_CARE_PROVIDER_SITE_OTHER): Payer: PRIVATE HEALTH INSURANCE | Admitting: Family Medicine

## 2013-10-06 VITALS — BP 130/100 | HR 88 | Ht 73.0 in | Wt 250.4 lb

## 2013-10-06 DIAGNOSIS — I10 Essential (primary) hypertension: Secondary | ICD-10-CM

## 2013-10-06 DIAGNOSIS — B356 Tinea cruris: Secondary | ICD-10-CM | POA: Insufficient documentation

## 2013-10-06 DIAGNOSIS — E119 Type 2 diabetes mellitus without complications: Secondary | ICD-10-CM

## 2013-10-06 MED ORDER — CLOTRIMAZOLE 1 % EX CREA: TOPICAL_CREAM | Freq: Two times a day (BID) | CUTANEOUS | Status: DC

## 2013-10-06 NOTE — Progress Notes (Signed)
  Subjective:    Patient ID: Brian Dominguez, male    DOB: 04-23-1946, 67 y.o.   MRN: [id number]  HPI   Patient comes in today for medical reconciliation and HTN evaluation. He is also worried about a rash.   Rash  - Location: groin and axillae  - Duration: 3 days - Pattern: red, flaky, flat - Symptoms: itching, no pain - Red Flags:  > fever no  > new med no  > new food no  > mucosal involvement no    Hypertension  Home BP monitoring: no  Office BP: BP Readings from Last 3 Encounters:  10/06/13 130/100  09/16/13 167/100  08/28/13 163/94    Prescribed meds: At last visit patient not taking any HTN meds, therefore, cardura and amlodipine restarted at last visit, metoprolol held secondary to possible contribution to erectile dysfunction   Hypertension ROS:  Taking medications as prescribed:Yes Chest pain: No Shortness of breath: No Swelling of extremities: No TIA symptoms: No Regular Exercise: no Low Na+ Diet: no  Medical Reconciliation performed:  Current Outpatient Prescriptions on File Prior to Visit  Medication Sig Dispense Refill  . amLODipine (NORVASC) 10 MG tablet Take 1 tablet (10 mg total) by mouth daily.  30 tablet  2  . doxazosin (CARDURA) 2 MG tablet Take 2 tablets (4 mg total) by mouth at bedtime.  60 tablet  2  . glucose blood test strip Test blood sugar BID.  100 each  12  . metFORMIN (GLUCOPHAGE) 1000 MG tablet TAKE 1 TABLET (1,000 MG TOTAL) BY MOUTH 2 (TWO) TIMES DAILY WITH A MEAL.  60 tablet  5  . NEXIUM 40 MG capsule TAKE ONE (1) CAPSULE EACH DAY  30 capsule  6  . ranitidine (ZANTAC) 150 MG tablet Take 1 tablet (150 mg total) by mouth 2 (two) times daily.  60 tablet  5   No current facility-administered medications on file prior to visit.      Review of Systems     Objective:   Physical Exam BP 130/100  Pulse 88  Ht 6\' 1"  (1.854 m)  Wt 250 lb 6.4 oz (113.581 kg)  BMI 33.04 kg/m2 Gen: elderly AAM, obese, non distressed, pleasant and  conversant CV: RRR, no murmurs Pulm: CTA-B Skin: beefy red rash in patches over groin and axillae bilaterally, non tender, no warmth or drainge, no genital involvement, no mucosal involvement       Assessment & Plan:

## 2013-10-06 NOTE — Assessment & Plan Note (Signed)
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

## 2013-10-06 NOTE — Patient Instructions (Signed)
It was nice to see you today.  1. Rash - please use the medication called clotrimazole twice a day for 2 weeks and don't get any more steroid shots  2. Blood Pressure - please continue with amlodipine and cardura  3. Arthritis - you may take the celebrex to see if it helps  Come back in 2-3 weeks.   Dr. Clinton Sawyer

## 2013-10-06 NOTE — Assessment & Plan Note (Signed)
Assessment: difficult to control BP with poor compliance to medication Plan: continue cardura and amlodipine at current doses, consider introducing ARB in near future but do not want to add a new medication since patient has not be able to keep current medications organized, hopefully this will improve after medication reconciliation with our pharmacy resident

## 2013-10-14 ENCOUNTER — Encounter: Payer: Self-pay | Admitting: Family Medicine

## 2013-10-23 ENCOUNTER — Other Ambulatory Visit: Payer: Self-pay | Admitting: Family Medicine

## 2013-10-27 ENCOUNTER — Other Ambulatory Visit: Payer: Self-pay | Admitting: Family Medicine

## 2013-11-04 ENCOUNTER — Ambulatory Visit: Payer: PRIVATE HEALTH INSURANCE | Admitting: Family Medicine

## 2013-11-24 ENCOUNTER — Encounter: Payer: Self-pay | Admitting: Family Medicine

## 2013-11-24 ENCOUNTER — Ambulatory Visit (INDEPENDENT_AMBULATORY_CARE_PROVIDER_SITE_OTHER): Payer: PRIVATE HEALTH INSURANCE | Admitting: Family Medicine

## 2013-11-24 VITALS — BP 152/87 | HR 99 | Temp 98.4°F | Ht 73.0 in | Wt 255.0 lb

## 2013-11-24 DIAGNOSIS — L918 Other hypertrophic disorders of the skin: Secondary | ICD-10-CM

## 2013-11-24 DIAGNOSIS — K219 Gastro-esophageal reflux disease without esophagitis: Secondary | ICD-10-CM

## 2013-11-24 DIAGNOSIS — L909 Atrophic disorder of skin, unspecified: Secondary | ICD-10-CM

## 2013-11-24 DIAGNOSIS — R300 Dysuria: Secondary | ICD-10-CM

## 2013-11-24 LAB — POCT URINALYSIS DIPSTICK
Blood, UA: NEGATIVE
Nitrite, UA: NEGATIVE
pH, UA: 6

## 2013-11-24 NOTE — Patient Instructions (Signed)
Mr. Pandya,   It was nice to see you.   Acid Reflux - For you stomach burning, I will make a referral to Dr. Elnoria Howard.   Skin Tag on the Neck - Keep a bandaid on it for a week. It is does not get better, call and schedule an appointment for removal of the skin tag only. It is your responsibility to make sure no other issues are addressed at that visit.   Merry Christmas,   Dr. Clinton Sawyer
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
## 2013-11-26 DIAGNOSIS — L918 Other hypertrophic disorders of the skin: Secondary | ICD-10-CM | POA: Insufficient documentation
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
## 2013-11-26 NOTE — Assessment & Plan Note (Signed)
A: large left anterior neck acrochordon P: cryosurgery performed, if not resolved, then pt to return in 1 month for excision

## 2013-11-26 NOTE — Assessment & Plan Note (Signed)
A: pt continue to complain of burning in abdomen despite recent treatment for candida esophagitis for 3 weeks and continued use of H2 blocker and PPI simultaneously; previous work up by Dr. Elnoria Howard did not reveal ulcer or severe esophagitis, pt without odynophagia, dysphagia or weight loss but is very frustrated by sensation of burning and fullness in his stomach P: I gave the patient the option to have a gastric emptying study to see if he has gastroparesis or repeat referral to Dr. Elnoria Howard, gastroenterologist, for follow up and the patient prefers to see Dr. Elnoria Howard. Referral made. Cont H2 blocker and PPI.

## 2013-12-07 ENCOUNTER — Telehealth: Payer: Self-pay | Admitting: *Deleted

## 2013-12-07 NOTE — Telephone Encounter (Signed)
Pharmacy calling asking for refill of Celebrex for patient.

## 2013-12-07 NOTE — Telephone Encounter (Signed)
Patient will need an appt with his PCP to discuss continuation of this medication given his history of reflux.

## 2013-12-08 NOTE — Telephone Encounter (Signed)
Called pt. Informed. Lorenda Hatchet, Renato Battles  he has upcoming appt.

## 2013-12-15 ENCOUNTER — Other Ambulatory Visit: Payer: Self-pay | Admitting: Family Medicine

## 2013-12-15 MED ORDER — CELECOXIB 100 MG PO CAPS: 100.0000 mg | ORAL_CAPSULE | Freq: Two times a day (BID) | ORAL | Status: DC | PRN

## 2013-12-18 ENCOUNTER — Other Ambulatory Visit: Payer: Self-pay | Admitting: Family Medicine

## 2013-12-18 DIAGNOSIS — I10 Essential (primary) hypertension: Secondary | ICD-10-CM

## 2013-12-28 ENCOUNTER — Ambulatory Visit: Payer: PRIVATE HEALTH INSURANCE | Admitting: Family Medicine

## 2013-12-29 ENCOUNTER — Other Ambulatory Visit: Payer: Self-pay | Admitting: Family Medicine

## 2013-12-29 DIAGNOSIS — E119 Type 2 diabetes mellitus without complications: Secondary | ICD-10-CM

## 2013-12-29 MED ORDER — GLUCOSE BLOOD VI STRP: ORAL_STRIP | Status: DC

## 2014-01-13 ENCOUNTER — Ambulatory Visit: Payer: PRIVATE HEALTH INSURANCE | Admitting: Family Medicine

## 2014-02-18 ENCOUNTER — Other Ambulatory Visit: Payer: Self-pay | Admitting: Family Medicine

## 2014-03-03 ENCOUNTER — Other Ambulatory Visit: Payer: Self-pay | Admitting: Internal Medicine

## 2014-03-03 ENCOUNTER — Other Ambulatory Visit: Payer: Self-pay | Admitting: Family Medicine

## 2014-03-04 ENCOUNTER — Other Ambulatory Visit: Payer: Self-pay | Admitting: Family Medicine

## 2014-03-16 ENCOUNTER — Other Ambulatory Visit: Payer: Self-pay | Admitting: Family Medicine

## 2014-03-25 ENCOUNTER — Encounter: Payer: Self-pay | Admitting: Family Medicine

## 2014-03-25 ENCOUNTER — Ambulatory Visit (INDEPENDENT_AMBULATORY_CARE_PROVIDER_SITE_OTHER): Payer: PRIVATE HEALTH INSURANCE | Admitting: Family Medicine

## 2014-03-25 VITALS — BP 162/93 | HR 99 | Ht 73.0 in | Wt 256.0 lb

## 2014-03-25 DIAGNOSIS — R300 Dysuria: Secondary | ICD-10-CM

## 2014-03-25 DIAGNOSIS — R3589 Other polyuria: Secondary | ICD-10-CM

## 2014-03-25 DIAGNOSIS — J309 Allergic rhinitis, unspecified: Secondary | ICD-10-CM

## 2014-03-25 DIAGNOSIS — M25519 Pain in unspecified shoulder: Secondary | ICD-10-CM

## 2014-03-25 DIAGNOSIS — R358 Other polyuria: Secondary | ICD-10-CM

## 2014-03-25 DIAGNOSIS — E119 Type 2 diabetes mellitus without complications: Secondary | ICD-10-CM

## 2014-03-25 LAB — BASIC METABOLIC PANEL WITH GFR
BUN: 16 mg/dL (ref 6–23)
CO2: 29 meq/L (ref 19–32)
Calcium: 9.9 mg/dL (ref 8.4–10.5)
Chloride: 100 meq/L (ref 96–112)
Creat: 0.94 mg/dL (ref 0.50–1.35)
Glucose, Bld: 138 mg/dL — ABNORMAL HIGH (ref 70–99)
Potassium: 4.1 meq/L (ref 3.5–5.3)
Sodium: 138 meq/L (ref 135–145)

## 2014-03-25 LAB — POCT URINALYSIS DIPSTICK
Bilirubin, UA: NEGATIVE
GLUCOSE UA: NEGATIVE
LEUKOCYTES UA: NEGATIVE
NITRITE UA: NEGATIVE
PH UA: 5.5
Protein, UA: NEGATIVE
RBC UA: NEGATIVE
Spec Grav, UA: >=1.03
UROBILINOGEN UA: 0.2

## 2014-03-25 LAB — LIPID PANEL
Cholesterol: 148 mg/dL (ref 0–200)
HDL: 28 mg/dL — ABNORMAL LOW (ref >39)
LDL CALC: 89 mg/dL (ref 0–99)
Total CHOL/HDL Ratio: 5.3 Ratio
Triglycerides: 155 mg/dL — ABNORMAL HIGH (ref <150)
VLDL: 31 mg/dL (ref 0–40)

## 2014-03-25 LAB — POCT GLYCOSYLATED HEMOGLOBIN (HGB A1C): Hemoglobin A1C: 7.3

## 2014-03-25 MED ORDER — METHYLPREDNISOLONE ACETATE 80 MG/ML IJ SUSP
80.0000 mg | Freq: Once | INTRAMUSCULAR | Status: AC
  Administered 2014-03-25: 80 mg via INTRA_ARTICULAR

## 2014-03-25 MED ORDER — FLUTICASONE PROPIONATE 50 MCG/ACT NA SUSP: 2.0000 | Freq: Every day | NASAL | Status: DC

## 2014-03-25 NOTE — Assessment & Plan Note (Signed)
A: poorly controlled on zyrtec P: start fluticasone nasal spray, stop zyrtec

## 2014-03-25 NOTE — Progress Notes (Signed)
Subjective:    Patient ID: Brian Dominguez, male    DOB: 07/06/1946, 68 y.o.   MRN: [id number]  HPI  68 year old male with a history of diabetes, GERD, and osteoarthritis who presents for evaluation of left shoulder pain and allergies.  Seasonal allergies - this has been a persistent problem for the patient for several years. He currently takes Zyrtec 10 mg daily, but does not think this helps. His most bothersome symptoms are runny nose, itchy throat, and watery eyes. He has not tried any other treatments.  Left shoulder pain - the patient states he has a history of left shoulder pain 2 to rotator cuff tear. He reports that this is been [facility], who recommended that he have surgery. However he is hesitant to have surgery, because it is not legal work. Currently his pain is a 10 out of 10. It is worsened by lifting his arm and rotating externally. His head no recent falls or trauma. The pain has been relieved in the past by steroid injections.  Polyuria - pt noticed increased frequency, "going all the time," denies pain or bleeding, no fever, chills or flank pain, no nausea or vomiting; has hx of diagnosis of chronic prostatitis   Current Outpatient Prescriptions on File Prior to Visit  Medication Sig Dispense Refill  . amLODipine (NORVASC) 10 MG tablet TAKE 1 TABLET DAILY  30 tablet  5  . celecoxib (CELEBREX) 100 MG capsule TAKE 1 CAPSULE (100 MG TOTAL) BY MOUTH 2 (TWO) TIMES DAILY AS NEEDED.  60 capsule  0  . cetirizine (ZYRTEC) 10 MG tablet Take 10 mg by mouth daily as needed for allergies.      . clotrimazole (LOTRIMIN) 1 % cream Apply topically 2 (two) times daily.  120 g  1  . cyclobenzaprine (FLEXERIL) 10 MG tablet Take 10 mg by mouth 2 (two) times daily as needed for muscle spasms.      Marland Kitchen. doxazosin (CARDURA) 2 MG tablet TAKE 2 TABLETS AT BEDTIME.  60 tablet  2  . gabapentin (NEURONTIN) 300 MG capsule Take 300 mg by mouth 3 (three) times  daily. Take 1 capsule BID for 5 days, then increase to 1 capsule TID      . glucose blood test strip Test blood sugar BID.  100 each  12  . metFORMIN (GLUCOPHAGE) 1000 MG tablet TAKE 1 TABLET (1,000 MG TOTAL) BY MOUTH 2 (TWO) TIMES DAILY WITH A MEAL.  60 tablet  5  . NEXIUM 40 MG capsule TAKE ONE (1) CAPSULE EACH DAY  30 capsule  6  . ranitidine (ZANTAC) 150 MG tablet TAKE 1 TABLET (150 MG TOTAL) BY MOUTH 2 (TWO) TIMES DAILY.  60 tablet  5   No current facility-administered medications on file prior to visit.     Review of Systems See HPI    Objective:   Physical Exam BP 162/93  Pulse 99  Ht 6\' 1"  (1.854 m)  Wt 256 lb (116.121 kg)  BMI 33.78 kg/m2  Shoulder Exam - left HEENT: NCAT, PERRLA, mild rhinorrhea with boggy turbinates, Op clear and moist, no exudate, no lymphadenopathy Pulm: normal work of breathing Inspection: mild atrophy of deltoid Palpation:   Clavicle: non tender  AC Joint: non tender  Scapula: non tender             Biceps Tendon: non tender ROM/Strength:  Abduction (Suprapsinatus): limited  Internal Rotation/Liftoff (Subsapularis):normal  External Rotation (Infraspinatus/Teres Minor): limited Maneuvers:  Neer's: Negative  Hawkin's: Negative  Empty Can/Jobes for supraspinatus: negative  Drop Arm: Negative      Assessment & Plan:   Subacromial Injection: left sided   Verbal consent obtained and time out performed.  Area cleaned with alcohol.  80Mg  of depomedrol and 3ml of 0.5% marcaine was injected into the subacromial bursa without complication or bleeding. Patient tolerated the procedure well.

## 2014-03-25 NOTE — Assessment & Plan Note (Signed)
A: unclear etiology as pt without evidence of infection on exam, no hx of enlarged prostate based on eval from [facility] last month no problems with prostate P: check urine, consider culture for something like mycoplasma if dysuria starts; check A1C as that could be causative

## 2014-03-25 NOTE — Assessment & Plan Note (Signed)
No time to fully address today. Check A1C and lipid panel. Address at next appointment.

## 2014-03-25 NOTE — Patient Instructions (Signed)
Mr. Brian Dominguez,   Great to see you. Read below about what we addressed today:   1. Shoulder Pain - I can continue the injection every 3-6 months if you would like, but this is not a great long term solution. I don't know all the details of your injury, but surgery might be your best option.   2. Allergies - Please start using fluticasone nasal spray each day for the next month for allergies.   3. Cholesterol - We will check that today.   Please come back in one month to talk about that and your diabetes.   Sincerely,   Dr. Clinton SawyerWilliamson

## 2014-03-25 NOTE — Assessment & Plan Note (Signed)
Left shoulder pain consistent with impingement vs tendinopathy; given pt not interested in surgery, I provided a steroid injection at his request. See procedure note.

## 2014-03-25 NOTE — Progress Notes (Deleted)
Patient ID: Brian JakesJohn A Flemings, male   DOB: 08/11/1946, 68 y.o.   MRN: [id number]   Subjective:   Diabetes  Recent Issues:  Hypoglycemia: {yes no:315493::"Yes"}   Medication Compliance: Diabetes medication: Yes - metformin 1000 mg BID Taking ACE-I: No - angioedema Taking statin: No  Behavioral: Home CBG Monitoring: {yes/no:20286} Diet changes: {yes/no:20286} Exercise: {yes/no:20286}  Health Maintenance: Visual problems: {yes/no:20286} Last eye exam:*** Last dental visit: *** Foot ulcers: {yes/no:20286}  Current Outpatient Prescriptions on File Prior to Visit  Medication Sig Dispense Refill  . amLODipine (NORVASC) 10 MG tablet TAKE 1 TABLET DAILY  30 tablet  5  . celecoxib (CELEBREX) 100 MG capsule TAKE 1 CAPSULE (100 MG TOTAL) BY MOUTH 2 (TWO) TIMES DAILY AS NEEDED.  60 capsule  0  . cetirizine (ZYRTEC) 10 MG tablet Take 10 mg by mouth daily as needed for allergies.      . clotrimazole (LOTRIMIN) 1 % cream Apply topically 2 (two) times daily.  120 g  1  . cyclobenzaprine (FLEXERIL) 10 MG tablet Take 10 mg by mouth 2 (two) times daily as needed for muscle spasms.      Marland Kitchen. doxazosin (CARDURA) 2 MG tablet TAKE 2 TABLETS AT BEDTIME.  60 tablet  2  . gabapentin (NEURONTIN) 300 MG capsule Take 300 mg by mouth 3 (three) times daily. Take 1 capsule BID for 5 days, then increase to 1 capsule TID      . glucose blood test strip Test blood sugar BID.  100 each  12  . metFORMIN (GLUCOPHAGE) 1000 MG tablet TAKE 1 TABLET (1,000 MG TOTAL) BY MOUTH 2 (TWO) TIMES DAILY WITH A MEAL.  60 tablet  5  . NEXIUM 40 MG capsule TAKE ONE (1) CAPSULE EACH DAY  30 capsule  6  . ranitidine (ZANTAC) 150 MG tablet TAKE 1 TABLET (150 MG TOTAL) BY MOUTH 2 (TWO) TIMES DAILY.  60 tablet  5   No current facility-administered medications on file prior to visit.    Left Shoulder Pain -   Review of Systems:  {Ros - complete:30496}     Objective:   Physical Exam: BP 162/93  Pulse 99  Ht 6\' 1"  (1.854 m)  Wt 256  lb (116.121 kg)  BMI 33.78 kg/m2  General: {appearance:315021::"alert, well appearing, and in no distress"} Eyes: {Exam; eye diabetes:14076} Cardiovascular: {exam; cv ped:12263} Pulmonary: {Exam; lung:16931} Feet: {pe foot exam:315761::"warm, good capillary refill"}  Labs:   Diabetic Labs:  Lab Results  Component Value Date   HGBA1C 7.3 03/25/2014   HGBA1C 7.4 09/16/2013   HGBA1C 7.5 06/09/2013   Lab Results  Component Value Date   LDLCALC 85 07/21/2010   CREATININE 0.94 07/10/2013   Last microalbumin: No results found for this basename: MICROALBUR, [id number]        Assessment & Plan:

## 2014-03-26 ENCOUNTER — Encounter: Payer: Self-pay | Admitting: Family Medicine

## 2014-03-26 DIAGNOSIS — E785 Hyperlipidemia, unspecified: Secondary | ICD-10-CM | POA: Insufficient documentation

## 2014-04-20 ENCOUNTER — Other Ambulatory Visit: Payer: Self-pay | Admitting: Family Medicine

## 2014-04-23 ENCOUNTER — Ambulatory Visit: Payer: PRIVATE HEALTH INSURANCE | Admitting: Family Medicine

## 2014-05-11 ENCOUNTER — Encounter: Payer: Self-pay | Admitting: Family Medicine

## 2014-05-11 ENCOUNTER — Ambulatory Visit (INDEPENDENT_AMBULATORY_CARE_PROVIDER_SITE_OTHER): Payer: PRIVATE HEALTH INSURANCE | Admitting: Family Medicine

## 2014-05-11 VITALS — BP 171/99 | HR 106 | Ht 73.0 in | Wt 254.0 lb

## 2014-05-11 DIAGNOSIS — E119 Type 2 diabetes mellitus without complications: Secondary | ICD-10-CM

## 2014-05-11 DIAGNOSIS — E785 Hyperlipidemia, unspecified: Secondary | ICD-10-CM

## 2014-05-11 DIAGNOSIS — I10 Essential (primary) hypertension: Secondary | ICD-10-CM

## 2014-05-11 LAB — MICROALBUMIN / CREATININE URINE RATIO
Creatinine, Urine: 240.2 mg/dL
MICROALB UR: 0.81 mg/dL (ref 0.00–1.89)
Microalb Creat Ratio: 3.4 mg/g (ref 0.0–30.0)

## 2014-05-11 MED ORDER — ATORVASTATIN CALCIUM 40 MG PO TABS: 40.0000 mg | ORAL_TABLET | Freq: Every day | ORAL | Status: DC

## 2014-05-11 MED ORDER — HYDROCHLOROTHIAZIDE 25 MG PO TABS: 25.0000 mg | ORAL_TABLET | Freq: Every day | ORAL | Status: DC

## 2014-05-11 NOTE — Progress Notes (Signed)
Patient ID: Brian Dominguez, male   DOB: 08-28-1946, 68 y.o.   MRN: [id number]   Subjective:   Diabetes  Recent Issues:  Hypoglycemia: Yes   Medication Compliance: Diabetes medication: Yes Taking ACE-I: No - hx of angioedema Taking statin: No - risk of CAD > 50% in next two years, so he needs treatment with statin; pt denies any history of being on a statin   Behavioral: Home CBG Monitoring: Yes, less than 150 Diet changes: No Exercise: minimal due to knee pain  Health Maintenance: Visual problems: No Last eye exam: October 2014 - no DR Last dental visit: unknown Foot ulcers: No Vaccinations: had pneumonia vaccinations    Hypertension  Home BP monitoring: no  Office BP: BP Readings from Last 3 Encounters:  05/11/14 171/99  03/25/14 162/93  11/24/13 152/87    Prescribed meds: see below  Hypertension ROS:  Taking medications as prescribed:Yes Chest pain: No Shortness of breath: No Swelling of extremities: No TIA symptoms: No Regular exercise: No Low Na+ diet: No Alcohol/tobacco/drug use: Yes - cigarettes   Current Outpatient Prescriptions on File Prior to Visit  Medication Sig Dispense Refill  . amLODipine (NORVASC) 10 MG tablet TAKE 1 TABLET DAILY  30 tablet  5  . celecoxib (CELEBREX) 100 MG capsule TAKE 1 CAPSULE (100 MG TOTAL) BY MOUTH 2 (TWO) TIMES DAILY AS NEEDED.  60 capsule  3  . cetirizine (ZYRTEC) 10 MG tablet Take 10 mg by mouth daily as needed for allergies.      . clotrimazole (LOTRIMIN) 1 % cream Apply topically 2 (two) times daily.  120 g  1  . cyclobenzaprine (FLEXERIL) 10 MG tablet Take 10 mg by mouth 2 (two) times daily as needed for muscle spasms.      Marland Kitchen doxazosin (CARDURA) 2 MG tablet TAKE 2 TABLETS AT BEDTIME.  60 tablet  2  . fluticasone (FLONASE) 50 MCG/ACT nasal spray Place 2 sprays into both nostrils daily.  16 g  6  . gabapentin (NEURONTIN) 300 MG capsule Take 300 mg by mouth 3 (three) times daily. Take 1 capsule BID for 5 days, then  increase to 1 capsule TID      . glucose blood test strip Test blood sugar BID.  100 each  12  . metFORMIN (GLUCOPHAGE) 1000 MG tablet TAKE 1 TABLET (1,000 MG TOTAL) BY MOUTH 2 (TWO) TIMES DAILY WITH A MEAL.  60 tablet  5  . NEXIUM 40 MG capsule TAKE ONE (1) CAPSULE EACH DAY  30 capsule  6  . ranitidine (ZANTAC) 150 MG tablet TAKE 1 TABLET (150 MG TOTAL) BY MOUTH 2 (TWO) TIMES DAILY.  60 tablet  5   No current facility-administered medications on file prior to visit.   '   Review of Systems:  Pertinent items are noted in HPI.     Objective:   Physical Exam: BP 171/99  Pulse 106  Ht 6\' 1"  (1.854 m)  Wt 254 lb (115.214 kg)  BMI 33.52 kg/m2  General: alert, well appearing, and in no distress and elderly AAM Eyes: PERRLA, EOMI, no neovascularization or cotton wool spots on fundoscopic exam Cardiovascular: RRR, nl S1 and S2, no carotid bruit Pulmonary: clear to auscultation bilaterally and normal percussion bilaterally Feet: warm, good capillary refill, normal DP and PT pulses and normal monofilament exam Shoulder exam: deferred since performed thorough exam at last visit   Labs:   Diabetic Labs:  Lab Results  Component Value Date   HGBA1C 7.3 03/25/2014  HGBA1C 7.4 09/16/2013   HGBA1C 7.5 06/09/2013   Lab Results  Component Value Date   LDLCALC 89 03/25/2014   CREATININE 0.94 03/25/2014   Last microalbumin: No results found for this basename: MICROALBUR, [id number]        Assessment & Plan:

## 2014-05-11 NOTE — Patient Instructions (Signed)
Dear Ms. Brian Dominguez,   Thank you for coming to clinic today. Please read below regarding the issues that we discussed.   1. Blood Pressure - You pressure is too high, and this puts you at risk for stroke. Stop taking the cardura, which is the two tablets at night. Please start taking a new medication called hydrochlorothiazide. Take one tablet each night.  2. Cholesterol - your cholesterol and high blood pressure and diabetes but you at increased risk for heart disease. Therefore we need to start medication for your cholesterol. We will start Lipitor. I want you to take that each day. Follow up in one month so we can check on your cholesterol.  3. Urine protein - I will check to make sure there is no protein in your urine.   4. Diabetes - Continue taking the metformin.   5. Shoulder pain - Please call [facility] at [phone] to set up an appointment. If you need a referral from me, then please let me know.   Please follow up in clinic in 1 month. Please call earlier if you have any questions or concerns.   Sincerely,   Dr. Clinton Sawyer

## 2014-05-13 NOTE — Assessment & Plan Note (Signed)
A: very high risk for CAD given age, race, cholesterol, BP, smoking status P:  - start lipitor 40 mg daily  - f/u LDL and LFTs in 4 weeks

## 2014-05-13 NOTE — Assessment & Plan Note (Signed)
Poor control. Stop alpha blocker since not indicated and not working. Start HCTZ. F/u Na+ in 4 weeks. Consider ARB if needed.

## 2014-05-13 NOTE — Assessment & Plan Note (Signed)
A1C 7.3. Stable and no need to start another medication at this time since patient already starting new BP med and statin today. Encouraged regular exercise and low carb diet. F/u in 3 mos.

## 2014-05-26 ENCOUNTER — Other Ambulatory Visit: Payer: Self-pay | Admitting: Orthopedic Surgery

## 2014-06-08 ENCOUNTER — Ambulatory Visit: Payer: PRIVATE HEALTH INSURANCE | Admitting: Family Medicine

## 2014-06-24 ENCOUNTER — Encounter (HOSPITAL_COMMUNITY): Payer: Self-pay | Admitting: Pharmacy Technician

## 2014-06-26 NOTE — Pre-Procedure Instructions (Addendum)
Brian Dominguez  06/26/2014   Your procedure is scheduled on:  July 30  Report to [facility] Admitting at 05:30 AM.  Call this number if you have problems the morning of surgery: [phone]   Remember:   Do not eat food or drink liquids after midnight.   Take these medicines the morning of surgery with A SIP OF WATER: Amlodipine, Cardura, Nexium, Flonase, Gabapentin       Take all meds as ordered until day of surgery except as instructed below or per dr    STOP/ Do not take Aspirin, Aleve, Naproxen, Advil, Ibuprofen, Motrin, Vitamins, Herbs,vitamins or Supplements starting today     NO DIABETIC MEDS DAY OF SURGERY   Do not wear jewelry, make-up or nail polish.  Do not wear lotions, powders, or perfumes. You may wear deodorant.  Do not shave 48 hours prior to surgery. Men may shave face and neck.  Do not bring valuables to the hospital.  [facility] is not responsible for any belongings or valuables.               Contacts, dentures or bridgework may not be worn into surgery.  Leave suitcase in the car. After surgery it may be brought to your room.  For patients admitted to the hospital, discharge time is determined by your treatment team.               Special Instructions:  Special Instructions: [place] - Preparing for Surgery  Before surgery, you can play an important role.  Because skin is not sterile, your skin needs to be as free of germs as possible.  You can reduce the number of germs on you skin by washing with CHG (chlorahexidine gluconate) soap before surgery.  CHG is an antiseptic cleaner which kills germs and bonds with the skin to continue killing germs even after washing.  Please DO NOT use if you have an allergy to CHG or antibacterial soaps.  If your skin becomes reddened/irritated stop using the CHG and inform your nurse when you arrive at Short Stay.  Do not shave (including legs and underarms) for at least 48 hours prior to the first CHG shower.   You may shave your face.  Please follow these instructions carefully:   1.  Shower with CHG Soap the night before surgery and the morning of Surgery.  2.  If you choose to wash your hair, wash your hair first as usual with your normal shampoo.  3.  After you shampoo, rinse your hair and body thoroughly to remove the Shampoo.  4.  Use CHG as you would any other liquid soap.  You can apply chg directly  to the skin and wash gently with scrungie or a clean washcloth.  5.  Apply the CHG Soap to your body ONLY FROM THE NECK DOWN.  Do not use on open wounds or open sores.  Avoid contact with your eyes ears, mouth and genitals (private parts).  Wash genitals (private parts)       with your normal soap.  6.  Wash thoroughly, paying special attention to the area where your surgery will be performed.  7.  Thoroughly rinse your body with warm water from the neck down.  8.  DO NOT shower/wash with your normal soap after using and rinsing off the CHG Soap.  9.  Pat yourself dry with a clean towel.            10.  Wear clean pajamas.            11.  Place clean sheets on your bed the night of your first shower and do not sleep with pets.  Day of Surgery  Do not apply any lotions/deodorants the morning of surgery.  Please wear clean clothes to the hospital/surgery center.   Please read over the following fact sheets that you were given: Pain Booklet, Coughing and Deep Breathing, Blood Transfusion Information and Surgical Site Infection Prevention

## 2014-06-28 ENCOUNTER — Encounter (HOSPITAL_COMMUNITY)
Admission: RE | Admit: 2014-06-28 | Discharge: 2014-06-28 | Disposition: A | Discharge status: Home / Self Care | Payer: PRIVATE HEALTH INSURANCE | Source: Ambulatory Visit | Attending: Orthopedic Surgery | Admitting: Orthopedic Surgery

## 2014-06-28 ENCOUNTER — Ambulatory Visit (HOSPITAL_COMMUNITY)
Admission: RE | Admit: 2014-06-28 | Discharge: 2014-06-28 | Disposition: A | Discharge status: Home / Self Care | Payer: PRIVATE HEALTH INSURANCE | Source: Ambulatory Visit | Attending: Orthopedic Surgery | Admitting: Orthopedic Surgery

## 2014-06-28 ENCOUNTER — Encounter (HOSPITAL_COMMUNITY): Payer: Self-pay

## 2014-06-28 DIAGNOSIS — Z01818 Encounter for other preprocedural examination: Secondary | ICD-10-CM | POA: Diagnosis present

## 2014-06-28 HISTORY — DX: Gastro-esophageal reflux disease without esophagitis: K21.9

## 2014-06-28 LAB — CBC WITH DIFFERENTIAL/PLATELET
Basophils Absolute: 0 10*3/uL (ref 0.0–0.1)
Basophils Relative: 0 % (ref 0–1)
EOS ABS: 0.2 10*3/uL (ref 0.0–0.7)
EOS PCT: 4 % (ref 0–5)
HCT: 42.5 % (ref 39.0–52.0)
HEMOGLOBIN: 14.5 g/dL (ref 13.0–17.0)
Lymphocytes Relative: 41 % (ref 12–46)
Lymphs Abs: 2.5 10*3/uL (ref 0.7–4.0)
MCH: 28.4 pg (ref 26.0–34.0)
MCHC: 34.1 g/dL (ref 30.0–36.0)
MCV: 83.3 fL (ref 78.0–100.0)
MONOS PCT: 7 % (ref 3–12)
Monocytes Absolute: 0.4 10*3/uL (ref 0.1–1.0)
Neutro Abs: 2.8 10*3/uL (ref 1.7–7.7)
Neutrophils Relative %: 48 % (ref 43–77)
Platelets: 251 10*3/uL (ref 150–400)
RBC: 5.1 MIL/uL (ref 4.22–5.81)
RDW: 13.6 % (ref 11.5–15.5)
WBC: 6 10*3/uL (ref 4.0–10.5)

## 2014-06-28 LAB — URINALYSIS, ROUTINE W REFLEX MICROSCOPIC
Bilirubin Urine: NEGATIVE
Glucose, UA: NEGATIVE mg/dL
Hgb urine dipstick: NEGATIVE
Ketones, ur: 15 mg/dL — AB
Leukocytes, UA: NEGATIVE
Nitrite: NEGATIVE
Protein, ur: NEGATIVE mg/dL
Specific Gravity, Urine: 1.021 (ref 1.005–1.030)
Urobilinogen, UA: 1 mg/dL (ref 0.0–1.0)
pH: 6 (ref 5.0–8.0)

## 2014-06-28 LAB — COMPREHENSIVE METABOLIC PANEL
ALT: 16 U/L (ref 0–53)
AST: 15 U/L (ref 0–37)
Albumin: 4.6 g/dL (ref 3.5–5.2)
Alkaline Phosphatase: 53 U/L (ref 39–117)
Anion gap: 15 (ref 5–15)
BUN: 7 mg/dL (ref 6–23)
CO2: 27 mEq/L (ref 19–32)
Calcium: 9.6 mg/dL (ref 8.4–10.5)
Chloride: 101 mEq/L (ref 96–112)
Creatinine, Ser: 0.9 mg/dL (ref 0.50–1.35)
GFR calc Af Amer: >90 mL/min (ref >90)
GFR calc non Af Amer: 85 mL/min — ABNORMAL LOW (ref >90)
Glucose, Bld: 140 mg/dL — ABNORMAL HIGH (ref 70–99)
Potassium: 4.2 mEq/L (ref 3.7–5.3)
Sodium: 143 mEq/L (ref 137–147)
Total Bilirubin: 0.4 mg/dL (ref 0.3–1.2)
Total Protein: 7.9 g/dL (ref 6.0–8.3)

## 2014-06-28 LAB — PROTIME-INR
INR: 0.98 (ref 0.00–1.49)
PROTHROMBIN TIME: 13 s (ref 11.6–15.2)

## 2014-06-28 LAB — APTT: aPTT: 28 seconds (ref 24–37)

## 2014-06-28 NOTE — Progress Notes (Signed)
Pat  req type and screen day of surgery

## 2014-06-28 NOTE — Progress Notes (Signed)
06/28/14 0950  OBSTRUCTIVE SLEEP APNEA  Have you ever been diagnosed with sleep apnea through a sleep study? No  Do you snore loudly (loud enough to be heard through closed doors)?  1  Do you often feel tired, fatigued, or sleepy during the daytime? 0  Has anyone observed you stop breathing during your sleep? 0  Do you have, or are you being treated for high blood pressure? 1  BMI more than 35 kg/m2? 0  Age over 69 years old? 1  Neck circumference greater than 40 cm/16 inches? 1 (17)  Gender: 1  Obstructive Sleep Apnea Score 5  Score 4 or greater  Results sent to PCP

## 2014-07-07 MED ORDER — CEFAZOLIN SODIUM-DEXTROSE 2-3 GM-% IV SOLR
2.0000 g | INTRAVENOUS | Status: AC
Start: 2014-07-08 — End: 2014-07-08
  Administered 2014-07-08: 2 g via INTRAVENOUS
  Filled 2014-07-07: qty 50

## 2014-07-07 MED ORDER — POVIDONE-IODINE 7.5 % EX SOLN
Freq: Once | CUTANEOUS | Status: DC
  Filled 2014-07-07: qty 118

## 2014-07-07 NOTE — Progress Notes (Signed)
Talked to pt personally and instructed to arrive 0730. Pt voices understanding of instruction.

## 2014-07-08 ENCOUNTER — Encounter (HOSPITAL_COMMUNITY): Payer: PRIVATE HEALTH INSURANCE | Admitting: Certified Registered"

## 2014-07-08 ENCOUNTER — Inpatient Hospital Stay (HOSPITAL_COMMUNITY): Payer: PRIVATE HEALTH INSURANCE

## 2014-07-08 ENCOUNTER — Inpatient Hospital Stay (HOSPITAL_COMMUNITY)
Admission: RE | Admit: 2014-07-08 | Discharge: 2014-07-09 | DRG: 483 | Disposition: A | Discharge status: Home / Self Care | Payer: PRIVATE HEALTH INSURANCE | Source: Ambulatory Visit | Attending: Orthopedic Surgery | Admitting: Orthopedic Surgery

## 2014-07-08 ENCOUNTER — Inpatient Hospital Stay (HOSPITAL_COMMUNITY): Payer: PRIVATE HEALTH INSURANCE | Admitting: Certified Registered"

## 2014-07-08 ENCOUNTER — Encounter (HOSPITAL_COMMUNITY)
Admission: RE | Disposition: A | Discharge status: Home / Self Care | Payer: Self-pay | Source: Ambulatory Visit | Attending: Orthopedic Surgery

## 2014-07-08 DIAGNOSIS — E119 Type 2 diabetes mellitus without complications: Secondary | ICD-10-CM | POA: Diagnosis present

## 2014-07-08 DIAGNOSIS — F17200 Nicotine dependence, unspecified, uncomplicated: Secondary | ICD-10-CM | POA: Diagnosis present

## 2014-07-08 DIAGNOSIS — M19019 Primary osteoarthritis, unspecified shoulder: Secondary | ICD-10-CM | POA: Diagnosis present

## 2014-07-08 DIAGNOSIS — I10 Essential (primary) hypertension: Secondary | ICD-10-CM | POA: Diagnosis present

## 2014-07-08 DIAGNOSIS — Z888 Allergy status to other drugs, medicaments and biological substances status: Secondary | ICD-10-CM

## 2014-07-08 DIAGNOSIS — Z79899 Other long term (current) drug therapy: Secondary | ICD-10-CM

## 2014-07-08 DIAGNOSIS — N411 Chronic prostatitis: Secondary | ICD-10-CM | POA: Diagnosis present

## 2014-07-08 DIAGNOSIS — Z9089 Acquired absence of other organs: Secondary | ICD-10-CM

## 2014-07-08 DIAGNOSIS — Z8249 Family history of ischemic heart disease and other diseases of the circulatory system: Secondary | ICD-10-CM | POA: Diagnosis not present

## 2014-07-08 DIAGNOSIS — K219 Gastro-esophageal reflux disease without esophagitis: Secondary | ICD-10-CM | POA: Diagnosis present

## 2014-07-08 DIAGNOSIS — M25519 Pain in unspecified shoulder: Secondary | ICD-10-CM | POA: Diagnosis present

## 2014-07-08 DIAGNOSIS — M19012 Primary osteoarthritis, left shoulder: Secondary | ICD-10-CM

## 2014-07-08 HISTORY — PX: TOTAL SHOULDER ARTHROPLASTY: SHX126

## 2014-07-08 LAB — TYPE AND SCREEN
ABO/RH(D): B POS
Antibody Screen: NEGATIVE

## 2014-07-08 LAB — GLUCOSE, CAPILLARY
GLUCOSE-CAPILLARY: 130 mg/dL — AB (ref 70–99)
GLUCOSE-CAPILLARY: 145 mg/dL — AB (ref 70–99)
Glucose-Capillary: 199 mg/dL — ABNORMAL HIGH (ref 70–99)

## 2014-07-08 SURGERY — ARTHROPLASTY, SHOULDER, TOTAL
Anesthesia: General | Site: Shoulder | Laterality: Left

## 2014-07-08 MED ORDER — PROPOFOL 10 MG/ML IV BOLUS
INTRAVENOUS | Status: DC | PRN
  Administered 2014-07-08: 10 mg via INTRAVENOUS
  Administered 2014-07-08: 150 mg via INTRAVENOUS

## 2014-07-08 MED ORDER — MIDAZOLAM HCL 2 MG/2ML IJ SOLN
INTRAMUSCULAR | Status: AC
  Filled 2014-07-08: qty 2

## 2014-07-08 MED ORDER — FENTANYL CITRATE 0.05 MG/ML IJ SOLN
INTRAMUSCULAR | Status: DC | PRN
  Administered 2014-07-08: 50 ug via INTRAVENOUS
  Administered 2014-07-08: 100 ug via INTRAVENOUS

## 2014-07-08 MED ORDER — ZOLPIDEM TARTRATE 5 MG PO TABS: 5.0000 mg | ORAL_TABLET | Freq: Every evening | ORAL | Status: DC | PRN

## 2014-07-08 MED ORDER — HYDROCHLOROTHIAZIDE 25 MG PO TABS
25.0000 mg | ORAL_TABLET | Freq: Every day | ORAL | Status: DC
  Administered 2014-07-09: 25 mg via ORAL
  Filled 2014-07-08: qty 1

## 2014-07-08 MED ORDER — FENTANYL CITRATE 0.05 MG/ML IJ SOLN
INTRAMUSCULAR | Status: AC
  Administered 2014-07-08: 100 ug
  Filled 2014-07-08: qty 2

## 2014-07-08 MED ORDER — ONDANSETRON HCL 4 MG/2ML IJ SOLN
INTRAMUSCULAR | Status: DC | PRN
  Administered 2014-07-08: 4 mg via INTRAVENOUS

## 2014-07-08 MED ORDER — SODIUM CHLORIDE 0.9 % IR SOLN
Status: DC | PRN
  Administered 2014-07-08: 3000 mL

## 2014-07-08 MED ORDER — HYDROMORPHONE HCL PF 1 MG/ML IJ SOLN: 0.2500 mg | INTRAMUSCULAR | Status: DC | PRN

## 2014-07-08 MED ORDER — ROCURONIUM BROMIDE 100 MG/10ML IV SOLN
INTRAVENOUS | Status: DC | PRN
  Administered 2014-07-08: 50 mg via INTRAVENOUS

## 2014-07-08 MED ORDER — ARTIFICIAL TEARS OP OINT
TOPICAL_OINTMENT | OPHTHALMIC | Status: DC | PRN
  Administered 2014-07-08: 1 via OPHTHALMIC

## 2014-07-08 MED ORDER — LACTATED RINGERS IV SOLN
INTRAVENOUS | Status: DC | PRN
  Administered 2014-07-08 (×2): via INTRAVENOUS

## 2014-07-08 MED ORDER — ACETAMINOPHEN 650 MG RE SUPP: 650.0000 mg | Freq: Four times a day (QID) | RECTAL | Status: DC | PRN

## 2014-07-08 MED ORDER — MEPERIDINE HCL 25 MG/ML IJ SOLN: 6.2500 mg | INTRAMUSCULAR | Status: DC | PRN

## 2014-07-08 MED ORDER — LACTATED RINGERS IV SOLN
INTRAVENOUS | Status: DC
  Administered 2014-07-08: 08:00:00 via INTRAVENOUS

## 2014-07-08 MED ORDER — OXYCODONE HCL 5 MG/5ML PO SOLN
5.0000 mg | Freq: Once | ORAL | Status: DC | PRN
Start: 2014-07-08 — End: 2014-07-08

## 2014-07-08 MED ORDER — PROPOFOL 10 MG/ML IV BOLUS
INTRAVENOUS | Status: AC
  Filled 2014-07-08: qty 20

## 2014-07-08 MED ORDER — MIDAZOLAM HCL 2 MG/2ML IJ SOLN
INTRAMUSCULAR | Status: AC
  Administered 2014-07-08: 2 mg
  Filled 2014-07-08: qty 2

## 2014-07-08 MED ORDER — HYDROCODONE-ACETAMINOPHEN 5-325 MG PO TABS: 1.0000 | ORAL_TABLET | ORAL | Status: DC | PRN

## 2014-07-08 MED ORDER — FLUTICASONE PROPIONATE 50 MCG/ACT NA SUSP: 2.0000 | Freq: Every day | NASAL | Status: DC

## 2014-07-08 MED ORDER — NEOSTIGMINE METHYLSULFATE 10 MG/10ML IV SOLN
INTRAVENOUS | Status: DC | PRN
  Administered 2014-07-08: 4 mg via INTRAVENOUS

## 2014-07-08 MED ORDER — GLYCOPYRROLATE 0.2 MG/ML IJ SOLN
INTRAMUSCULAR | Status: DC | PRN
  Administered 2014-07-08: 0.6 mg via INTRAVENOUS

## 2014-07-08 MED ORDER — METFORMIN HCL 500 MG PO TABS
1000.0000 mg | ORAL_TABLET | Freq: Two times a day (BID) | ORAL | Status: DC
  Administered 2014-07-08 – 2014-07-09 (×2): 1000 mg via ORAL
  Filled 2014-07-08 (×4): qty 2

## 2014-07-08 MED ORDER — ASPIRIN EC 325 MG PO TBEC
325.0000 mg | DELAYED_RELEASE_TABLET | Freq: Two times a day (BID) | ORAL | Status: DC
  Administered 2014-07-08 – 2014-07-09 (×2): 325 mg via ORAL
  Filled 2014-07-08 (×4): qty 1

## 2014-07-08 MED ORDER — LIDOCAINE HCL 4 % MT SOLN
OROMUCOSAL | Status: DC | PRN
Start: 2014-07-08 — End: 2014-07-08
  Administered 2014-07-08: 4 mL via TOPICAL

## 2014-07-08 MED ORDER — ALUMINUM HYDROXIDE GEL 320 MG/5ML PO SUSP
15.0000 mL | ORAL | Status: DC | PRN
  Filled 2014-07-08: qty 30

## 2014-07-08 MED ORDER — METOCLOPRAMIDE HCL 5 MG/ML IJ SOLN: 5.0000 mg | Freq: Three times a day (TID) | INTRAMUSCULAR | Status: DC | PRN

## 2014-07-08 MED ORDER — POLYETHYLENE GLYCOL 3350 17 G PO PACK: 17.0000 g | PACK | Freq: Every day | ORAL | Status: DC | PRN

## 2014-07-08 MED ORDER — LIDOCAINE HCL (CARDIAC) 20 MG/ML IV SOLN
INTRAVENOUS | Status: AC
  Filled 2014-07-08: qty 5

## 2014-07-08 MED ORDER — ACETAMINOPHEN 325 MG PO TABS: 650.0000 mg | ORAL_TABLET | Freq: Four times a day (QID) | ORAL | Status: DC | PRN

## 2014-07-08 MED ORDER — METHOCARBAMOL 500 MG PO TABS
500.0000 mg | ORAL_TABLET | Freq: Four times a day (QID) | ORAL | Status: DC | PRN
  Administered 2014-07-09: 500 mg via ORAL
  Filled 2014-07-08: qty 1

## 2014-07-08 MED ORDER — FLEET ENEMA 7-19 GM/118ML RE ENEM: 1.0000 | ENEMA | Freq: Once | RECTAL | Status: AC | PRN

## 2014-07-08 MED ORDER — MENTHOL 3 MG MT LOZG: 1.0000 | LOZENGE | OROMUCOSAL | Status: DC | PRN

## 2014-07-08 MED ORDER — AMLODIPINE BESYLATE 10 MG PO TABS
10.0000 mg | ORAL_TABLET | Freq: Every day | ORAL | Status: DC
  Administered 2014-07-09: 10 mg via ORAL
  Filled 2014-07-08: qty 1

## 2014-07-08 MED ORDER — FENTANYL CITRATE 0.05 MG/ML IJ SOLN
INTRAMUSCULAR | Status: AC
  Filled 2014-07-08: qty 5

## 2014-07-08 MED ORDER — MORPHINE SULFATE 2 MG/ML IJ SOLN: 1.0000 mg | INTRAMUSCULAR | Status: DC | PRN

## 2014-07-08 MED ORDER — LIDOCAINE HCL (CARDIAC) 20 MG/ML IV SOLN
INTRAVENOUS | Status: DC | PRN
  Administered 2014-07-08: 100 mg via INTRAVENOUS

## 2014-07-08 MED ORDER — PHENYLEPHRINE HCL 10 MG/ML IJ SOLN
10.0000 mg | INTRAVENOUS | Status: DC | PRN
  Administered 2014-07-08: 25 ug/min via INTRAVENOUS

## 2014-07-08 MED ORDER — 0.9 % SODIUM CHLORIDE (POUR BTL) OPTIME
TOPICAL | Status: DC | PRN
  Administered 2014-07-08: 1000 mL

## 2014-07-08 MED ORDER — METOCLOPRAMIDE HCL 5 MG PO TABS
5.0000 mg | ORAL_TABLET | Freq: Three times a day (TID) | ORAL | Status: DC | PRN
  Filled 2014-07-08: qty 2

## 2014-07-08 MED ORDER — GLYCOPYRROLATE 0.2 MG/ML IJ SOLN
INTRAMUSCULAR | Status: AC
  Filled 2014-07-08: qty 3

## 2014-07-08 MED ORDER — ONDANSETRON HCL 4 MG PO TABS: 4.0000 mg | ORAL_TABLET | Freq: Four times a day (QID) | ORAL | Status: DC | PRN

## 2014-07-08 MED ORDER — ROCURONIUM BROMIDE 50 MG/5ML IV SOLN
INTRAVENOUS | Status: AC
  Filled 2014-07-08: qty 1

## 2014-07-08 MED ORDER — MIDAZOLAM HCL 5 MG/5ML IJ SOLN
INTRAMUSCULAR | Status: DC | PRN
  Administered 2014-07-08: 2 mg via INTRAVENOUS

## 2014-07-08 MED ORDER — OXYCODONE HCL 5 MG PO TABS: 5.0000 mg | ORAL_TABLET | Freq: Once | ORAL | Status: DC | PRN

## 2014-07-08 MED ORDER — PHENOL 1.4 % MT LIQD: 1.0000 | OROMUCOSAL | Status: DC | PRN

## 2014-07-08 MED ORDER — DOCUSATE SODIUM 100 MG PO CAPS
100.0000 mg | ORAL_CAPSULE | Freq: Two times a day (BID) | ORAL | Status: DC
  Administered 2014-07-08 – 2014-07-09 (×2): 100 mg via ORAL
  Filled 2014-07-08 (×3): qty 1

## 2014-07-08 MED ORDER — PHENYLEPHRINE 40 MCG/ML (10ML) SYRINGE FOR IV PUSH (FOR BLOOD PRESSURE SUPPORT)
PREFILLED_SYRINGE | INTRAVENOUS | Status: AC
  Filled 2014-07-08: qty 10

## 2014-07-08 MED ORDER — METHOCARBAMOL 1000 MG/10ML IJ SOLN
500.0000 mg | Freq: Four times a day (QID) | INTRAVENOUS | Status: DC | PRN
  Filled 2014-07-08: qty 5

## 2014-07-08 MED ORDER — CEFAZOLIN SODIUM 1-5 GM-% IV SOLN
1.0000 g | Freq: Four times a day (QID) | INTRAVENOUS | Status: AC
  Administered 2014-07-08 – 2014-07-09 (×3): 1 g via INTRAVENOUS
  Filled 2014-07-08 (×2): qty 50

## 2014-07-08 MED ORDER — PANTOPRAZOLE SODIUM 40 MG PO TBEC
80.0000 mg | DELAYED_RELEASE_TABLET | Freq: Every day | ORAL | Status: DC
  Administered 2014-07-08 – 2014-07-09 (×2): 80 mg via ORAL
  Filled 2014-07-08: qty 2

## 2014-07-08 MED ORDER — DOXAZOSIN MESYLATE 4 MG PO TABS
4.0000 mg | ORAL_TABLET | Freq: Every day | ORAL | Status: DC
  Administered 2014-07-09: 4 mg via ORAL
  Filled 2014-07-08: qty 1

## 2014-07-08 MED ORDER — GABAPENTIN 300 MG PO CAPS
300.0000 mg | ORAL_CAPSULE | Freq: Three times a day (TID) | ORAL | Status: DC | PRN
  Filled 2014-07-08: qty 1

## 2014-07-08 MED ORDER — ONDANSETRON HCL 4 MG/2ML IJ SOLN
4.0000 mg | Freq: Four times a day (QID) | INTRAMUSCULAR | Status: DC | PRN
  Administered 2014-07-09: 4 mg via INTRAVENOUS
  Filled 2014-07-08: qty 2

## 2014-07-08 MED ORDER — SODIUM CHLORIDE 0.9 % IV SOLN
INTRAVENOUS | Status: DC
  Administered 2014-07-08 – 2014-07-09 (×2): via INTRAVENOUS

## 2014-07-08 MED ORDER — OXYCODONE HCL 5 MG PO TABS
5.0000 mg | ORAL_TABLET | ORAL | Status: DC | PRN
  Administered 2014-07-09: 10 mg via ORAL
  Filled 2014-07-08: qty 2

## 2014-07-08 MED ORDER — BISACODYL 10 MG RE SUPP: 10.0000 mg | Freq: Every day | RECTAL | Status: DC | PRN

## 2014-07-08 MED ORDER — NEOSTIGMINE METHYLSULFATE 10 MG/10ML IV SOLN
INTRAVENOUS | Status: AC
  Filled 2014-07-08: qty 1

## 2014-07-08 MED ORDER — ATORVASTATIN CALCIUM 40 MG PO TABS
40.0000 mg | ORAL_TABLET | Freq: Every day | ORAL | Status: DC
  Administered 2014-07-08: 40 mg via ORAL
  Filled 2014-07-08 (×2): qty 1

## 2014-07-08 MED ORDER — OXYCODONE-ACETAMINOPHEN 5-325 MG PO TABS
1.0000 | ORAL_TABLET | ORAL | Status: DC | PRN
  Administered 2014-07-08: 1 via ORAL
  Administered 2014-07-09 (×2): 2 via ORAL
  Filled 2014-07-08 (×2): qty 2
  Filled 2014-07-08: qty 1

## 2014-07-08 MED ORDER — DIPHENHYDRAMINE HCL 12.5 MG/5ML PO ELIX: 12.5000 mg | ORAL_SOLUTION | ORAL | Status: DC | PRN

## 2014-07-08 MED ORDER — ONDANSETRON HCL 4 MG/2ML IJ SOLN: 4.0000 mg | Freq: Once | INTRAMUSCULAR | Status: DC | PRN

## 2014-07-08 SURGICAL SUPPLY — 68 items
ASSEMBLY NECK TAPER FIXED 135 (Orthopedic Implant) ×1 IMPLANT
BIT DRILL 5/64X5 DISP (BIT) IMPLANT
BLADE SAW SAG 73X25 THK (BLADE) ×1
BLADE SAW SGTL 73X25 THK (BLADE) ×1 IMPLANT
BLADE SURG 15 STRL LF DISP TIS (BLADE) ×1 IMPLANT
BLADE SURG 15 STRL SS (BLADE) ×2
CEMENT BONE DEPUY (Cement) ×2 IMPLANT
CHLORAPREP W/TINT 26ML (MISCELLANEOUS) ×4 IMPLANT
CLSR STERI-STRIP ANTIMIC 1/2X4 (GAUZE/BANDAGES/DRESSINGS) ×2 IMPLANT
COVER SURGICAL LIGHT HANDLE (MISCELLANEOUS) ×2 IMPLANT
DRAPE INCISE IOBAN 66X45 STRL (DRAPES) ×4 IMPLANT
DRAPE SURG 17X23 STRL (DRAPES) ×2 IMPLANT
DRAPE U-SHAPE 47X51 STRL (DRAPES) ×2 IMPLANT
DRSG MEPILEX BORDER 4X8 (GAUZE/BANDAGES/DRESSINGS) ×2 IMPLANT
ELECT BLADE 4.0 EZ CLEAN MEGAD (MISCELLANEOUS)
ELECT REM PT RETURN 9FT ADLT (ELECTROSURGICAL) ×2
ELECTRODE BLDE 4.0 EZ CLN MEGD (MISCELLANEOUS) IMPLANT
ELECTRODE REM PT RTRN 9FT ADLT (ELECTROSURGICAL) ×1 IMPLANT
EVACUATOR 1/8 PVC DRAIN (DRAIN) ×2 IMPLANT
GLENOID ANCHOR PEG CROSSLK 48 (Orthopedic Implant) ×1 IMPLANT
GLOVE BIO SURGEON STRL SZ7 (GLOVE) ×2 IMPLANT
GLOVE BIO SURGEON STRL SZ7.5 (GLOVE) ×2 IMPLANT
GLOVE BIOGEL PI IND STRL 7.0 (GLOVE) ×1 IMPLANT
GLOVE BIOGEL PI IND STRL 8 (GLOVE) ×1 IMPLANT
GLOVE BIOGEL PI INDICATOR 7.0 (GLOVE) ×1
GLOVE BIOGEL PI INDICATOR 8 (GLOVE) ×1
GOWN STRL REUS W/ TWL LRG LVL3 (GOWN DISPOSABLE) ×1 IMPLANT
GOWN STRL REUS W/ TWL XL LVL3 (GOWN DISPOSABLE) ×1 IMPLANT
GOWN STRL REUS W/TWL LRG LVL3 (GOWN DISPOSABLE) ×2
GOWN STRL REUS W/TWL XL LVL3 (GOWN DISPOSABLE) ×2
HANDPIECE INTERPULSE COAX TIP (DISPOSABLE) ×2
HEAD ECC HUMERAL SZ 48MMX18MM (Head) ×1 IMPLANT
HEMOSTAT SURGICEL 2X14 (HEMOSTASIS) ×2 IMPLANT
HOOD PEEL AWAY FACE SHEILD DIS (HOOD) ×4 IMPLANT
KIT BASIN OR (CUSTOM PROCEDURE TRAY) ×2 IMPLANT
KIT ROOM TURNOVER OR (KITS) ×2 IMPLANT
MANIFOLD NEPTUNE II (INSTRUMENTS) ×2 IMPLANT
NDL HYPO 25GX1X1/2 BEV (NEEDLE) IMPLANT
NDL MAYO TROCAR (NEEDLE) ×1 IMPLANT
NEEDLE HYPO 25GX1X1/2 BEV (NEEDLE) IMPLANT
NEEDLE MAYO TROCAR (NEEDLE) ×2 IMPLANT
NS IRRIG 1000ML POUR BTL (IV SOLUTION) ×2 IMPLANT
PACK SHOULDER (CUSTOM PROCEDURE TRAY) ×2 IMPLANT
PAD ARMBOARD 7.5X6 YLW CONV (MISCELLANEOUS) ×4 IMPLANT
RETRIEVER SUT HEWSON (MISCELLANEOUS) ×2 IMPLANT
SET HNDPC FAN SPRY TIP SCT (DISPOSABLE) ×1 IMPLANT
SLING ARM IMMOBILIZER LRG (SOFTGOODS) ×2 IMPLANT
SLING ARM IMMOBILIZER MED (SOFTGOODS) IMPLANT
SMARTMIX MINI TOWER (MISCELLANEOUS) ×2
SPONGE LAP 18X18 X RAY DECT (DISPOSABLE) ×2 IMPLANT
SPONGE LAP 4X18 X RAY DECT (DISPOSABLE) IMPLANT
STEM GLOBAL AP 12MM (Stem) ×1 IMPLANT
STRIP CLOSURE SKIN 1/2X4 (GAUZE/BANDAGES/DRESSINGS) ×2 IMPLANT
SUCTION FRAZIER TIP 10 FR DISP (SUCTIONS) ×3 IMPLANT
SUPPORT WRAP ARM LG (MISCELLANEOUS) ×2 IMPLANT
SUT ETHIBOND NAB CT1 #1 30IN (SUTURE) ×2 IMPLANT
SUT MNCRL AB 4-0 PS2 18 (SUTURE) ×2 IMPLANT
SUT SILK 2 0 TIES 17X18 (SUTURE)
SUT SILK 2-0 18XBRD TIE BLK (SUTURE) IMPLANT
SUT VIC AB 0 CTB1 27 (SUTURE) IMPLANT
SUT VIC AB 2-0 CT1 27 (SUTURE) ×4
SUT VIC AB 2-0 CT1 TAPERPNT 27 (SUTURE) ×1 IMPLANT
SYR CONTROL 10ML LL (SYRINGE) IMPLANT
TAPE FIBER 2MM 7IN #2 BLUE (SUTURE) ×6 IMPLANT
TOWEL OR 17X24 6PK STRL BLUE (TOWEL DISPOSABLE) ×2 IMPLANT
TOWEL OR 17X26 10 PK STRL BLUE (TOWEL DISPOSABLE) ×2 IMPLANT
TOWER SMARTMIX MINI (MISCELLANEOUS) ×1 IMPLANT
WATER STERILE IRR 1000ML POUR (IV SOLUTION) ×1 IMPLANT
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
## 2014-07-08 NOTE — Discharge Instructions (Signed)
Discharge Instructions after Total Shoulder Arthroplasty ° ° °A sling has been provided for you. Remove the sling 5 times each day to perform motion exercises. After the first 48 to 72 hours, discontinue using the sling. You should use the sling as a protective device, if you are in a crowd.  °Use ice on the shoulder intermittently over the first 48 hours after surgery.  °Pain medication has been prescribed for you.  °Use your medication liberally over the first 48 hours, and then begin to taper your use. You may take Extra Strength Tylenol or Tylenol only in place of the pain pills. DO NOT take ANY nonsteroidal anti-inflammatory pain medications: Advil, Motrin, Ibuprofen, Aleve, Naproxen, or Naprosyn. °Take one aspirin a day for 2 weeks after surgery, unless you have an aspirin sensitivity/allergy or asthma. °You may remove your dressing after two days.  °You may shower 5 days after surgery. The incision CANNOT get wet prior to 5 days. Simply allow the water to wash over the site and then pat dry. Do not rub the incision. Make sure your axilla (armpit) is completely dry after showering.  °Active reaching and lifting are not permitted. You may use the operative arm for activities of daily living that do not require the operative arm to leave the side of the body, such as eating, drinking, bathing, etc.  °Three to 5 times each day you should perform assisted overhead reaching and external rotation (outward turning) exercises with the operative arm. You were taught these exercises prior to discharge. Both exercises should be done with the non-operative arm used as the "therapist arm" while the operative arm remains relaxed. Ten of each exercise should be done three to five times each day. ° ° °Overhead reach is helping to lift your stiff arm up as high as it will go. To stretch your overhead reach, lie flat on your back, relax, and grasp the wrist of the tight shoulder with your opposite hand. Using the power in your  opposite arm, bring the stiff arm up as far as it is comfortable. Start holding it for ten seconds and then work up to where you can hold it for a count of 30. Breathe slowly and deeply while the arm is moved. Repeat this stretch ten times, trying to help the ar up a little higher each time.  ° ° ° °External rotation is turning the arm out to the side while your elbow stays close to your body. External rotation is best stretched while you are lying on your back. Hold a cane, yardstick, broom handle, or dowel in both hands. Bend both elbows to a right angle. Use steady, gentle force from your normal arm to rotate the hand of the stiff shoulder out away from your body. Continue the rotation as far as it will go comfortably, holding it there for a count of 10. Repeat this exercise ten times.  ° ° ° ° °Please call [phone] during normal business hours or [phone] after hours for any problems. Including the following: ° °- excessive redness of the incisions °- drainage for more than 4 days °- fever of more than 101.5 F ° °*Please note that pain medications will not be refilled after hours or on weekends. ° ° ° °

## 2014-07-08 NOTE — Transfer of Care (Signed)
Immediate Anesthesia Transfer of Care Note  Patient: Brian JakesJohn A Dominguez  Procedure(s) Performed: Procedure(s) with comments: LEFT TOTAL SHOULDER ARTHROPLASTY (Left) - Left total shoulder arthroplasty  Patient Location: PACU  Anesthesia Type:General  Level of Consciousness: awake, alert  and oriented  Airway & Oxygen Therapy: Patient connected to face mask oxygen  Post-op Assessment: Report given to PACU RN  Post vital signs: stable  Complications: No apparent anesthesia complications

## 2014-07-08 NOTE — Anesthesia Procedure Notes (Addendum)
Anesthesia Regional Block:  Interscalene brachial plexus block  Pre-Anesthetic Checklist: ,, timeout performed, Correct Patient, Correct Site, Correct Laterality, Correct Procedure, Correct Position, site marked, Risks and benefits discussed,  Surgical consent,  Pre-op evaluation,  At surgeon's request and post-op pain management  Laterality: Left  Prep: chloraprep       Needles:  Injection technique: Single-shot  Needle Type: Echogenic Stimulator Needle     Needle Length: 10cm 10 cm Needle Gauge: 21 and 21 G    Additional Needles:  Procedures: ultrasound guided (picture in chart) and nerve stimulator Interscalene brachial plexus block  Nerve Stimulator or Paresthesia:  Response: 0.4 mA,   Additional Responses:   Narrative:  Start time: 07/08/2014 8:55 AM End time: 07/08/2014 9:10 AM Injection made incrementally with aspirations every 5 mL.  Performed by: Personally  Anesthesiologist: Lillia Abed MD  Additional Notes: Monitors applied. Patient sedated. Sterile prep and drape,hand hygiene and sterile gloves were used. Relevant anatomy identified.Needle position confirmed.Local anesthetic injected incrementally after negative aspiration. Local anesthetic spread visualized around nerve(s). Vascular puncture avoided. No complications. Image printed for medical record.The patient tolerated the procedure well.        Procedure Name: Intubation Date/Time: 07/08/2014 9:40 AM Performed by: Maeola Harman Pre-anesthesia Checklist: Patient identified, Emergency Drugs available, Suction available, Patient being monitored and Timeout performed Patient Re-evaluated:Patient Re-evaluated prior to inductionOxygen Delivery Method: Circle system utilized Preoxygenation: Pre-oxygenation with 100% oxygen Intubation Type: IV induction Ventilation: Oral airway inserted - appropriate to patient size and Mask ventilation without difficulty Laryngoscope Size: Mac and 4 Grade View: Grade  I Tube type: Oral Tube size: 7.5 mm Number of attempts: 1 Airway Equipment and Method: Stylet and LTA kit utilized Placement Confirmation: ETT inserted through vocal cords under direct vision,  positive ETCO2 and breath sounds checked- equal and bilateral Secured at: 22 cm Tube secured with: Tape Dental Injury: Teeth and Oropharynx as per pre-operative assessment  Comments: Easy induction and intubation with MAC 4 blade by SRNA.  Dr. Conrad [place] verified placement.  Waldron Session, CRNA

## 2014-07-08 NOTE — Progress Notes (Signed)
Utilization review completed.  

## 2014-07-08 NOTE — Anesthesia Preprocedure Evaluation (Signed)
Anesthesia Evaluation  Patient identified by MRN, date of birth, ID band Patient awake    Reviewed: Allergy & Precautions, H&P , NPO status , Patient's Chart, lab work & pertinent test results  Airway Mallampati: I TM Distance: >3 FB Neck ROM: Full    Dental   Pulmonary Current Smoker,          Cardiovascular hypertension, Pt. on medications     Neuro/Psych    GI/Hepatic GERD-  Medicated and Controlled,  Endo/Other  diabetes, Well Controlled, Type 2, Oral Hypoglycemic Agents  Renal/GU      Musculoskeletal   Abdominal   Peds  Hematology   Anesthesia Other Findings   Reproductive/Obstetrics                           Anesthesia Physical Anesthesia Plan  ASA: II  Anesthesia Plan: General   Post-op Pain Management:    Induction: Intravenous  Airway Management Planned: Oral ETT  Additional Equipment:   Intra-op Plan:   Post-operative Plan: Extubation in OR  Informed Consent: I have reviewed the patients History and Physical, chart, labs and discussed the procedure including the risks, benefits and alternatives for the proposed anesthesia with the patient or authorized representative who has indicated his/her understanding and acceptance.     Plan Discussed with: CRNA and Surgeon  Anesthesia Plan Comments:         Anesthesia Quick Evaluation

## 2014-07-08 NOTE — Op Note (Signed)
Procedure(s): LEFT TOTAL SHOULDER ARTHROPLASTY Procedure Note  Brian Dominguez male 68 y.o. 07/08/2014  Procedure(s) and Anesthesia Type:    * LEFT TOTAL SHOULDER ARTHROPLASTY - General  Surgeon(s) and Role:    * Mable Paris, MD - Primary   Indications:  68 y.o. male  With endstage left shoulder arthritis. Pain and dysfunction interfered with quality of life and nonoperative treatment with activity modification, NSAIDS and injections failed.     Surgeon: Mable Paris   Assistants: Damita Lack PA-C [facility] was present and scrubbed throughout the procedure and was essential in positioning, retraction, exposure, and closure)  Anesthesia: General endotracheal anesthesia with preoperative interscalene block    Procedure Detail  LEFT TOTAL SHOULDER ARTHROPLASTY  Findings: DePuy APG total shoulder replacement with proximally porous coated size 12 stem and 48 x 18 eccentric head, 48 anchor peg glenoid cemented. A lesser tuberosity osteotomy was performed and repaired at the conclusion of the procedure.  Estimated Blood Loss:  300 mL         Drains: 1 medium hemovac  Blood Given: none          Specimens: none        Complications:  * No complications entered in OR log *         Disposition: PACU - hemodynamically stable.         Condition: stable    Procedure:   The patient was identified in the preoperative holding area where I personally marked the operative extremity after verifying with the patient and consent. He  was taken to the operating room where He was transferred to the   operative table.  The patient received an interscalene block in   the holding area by the attending anesthesiologist.  General anesthesia was induced   in the operating room without complication.  The patient did receive IV  Ancef prior to the commencement of the procedure.  The patient was   placed in the beach-chair position with the back raised about 30   degrees.  The nonoperative extremity and head and neck were carefully   positioned and padded protecting against neurovascular compromise.  The   left upper extremity was then prepped and draped in the standard sterile   fashion.    The appropriate operative time-out was performed with   Anesthesia, the perioperative staff, as well as myself and we all agreed   that the left side was the correct operative site.  An approximately   10 cm incision was made from the tip of the coracoid to the center point of the   humerus at the level of the axilla.  Dissection was carried down sharply   through subcutaneous tissues and cephalic vein was identified and taken   laterally with the deltoid.  The pectoralis major was taken medially.  The   upper 1 cm of the pectoralis major was released from its attachment on   the humerus.  The clavipectoral fascia was incised just lateral to the   conjoined tendon.  This incision was carried up to but not into the   coracoacromial ligament.  Digital palpation was used to prove   integrity of the axillary nerve which was protected throughout the   procedure.  Musculocutaneous nerve was not palpated in the operative   field.  Conjoined tendon was then retracted gently medially and the   deltoid laterally.  Anterior circumflex humeral vessels were clamped and   coagulated.  The soft tissues overlying the biceps  was incised and this   incision was carried across the transverse humeral ligament to the base   of the coracoid.  The biceps was tenodesed to the soft tissue just above   pectoralis major and the remaining portion of the biceps superiorly was   excised.  An osteotomy was performed at the lesser tuberosity and the   subscapularis was freed from the underlying capsule.  Capsule was then   released all the way down to the 6 o'clock position of the humeral head.   The humeral head was then delivered with simultaneous adduction,   extension and external  rotation.  All humeral osteophytes were removed   and the anatomic neck of the humerus was marked and cut free hand at   approximately 25 degrees retroversion within about 3 mm of the cuff   reflection posteriorly.  The head size was estimated to be a 48 medium   offset.  At that point, the humeral head was retracted posteriorly with   a Fukuda retractor and the anterior-inferior capsule was excised.   Remaining portion of the capsule was released at the base of the   coracoid.  The remaining biceps anchor and the entire anterior-inferior   labrum was excised.  The posterior labrum was also excised but the   posterior capsule was not released.  The guidepin was placed bicortically with +0 elevated guide.  The reamer was used to ream to concentric bone with punctate bleeding.  This gave an excellent concentric surface.  The center hole was then drilled for an anchor peg glenoid followed by the three peripheral holes and none of the holes   exited the glenoid wall.  I then pulse irrigated these holes and dried   them with Surgicel.  The three peripheral holes were then   pressurized cemented and the anchor peg glenoid was placed and impacted   with an excellent fit.  The glenoid was a 48 component.  The proximal humerus was then again exposed taking care not to displace the glenoid.    The humerus was then sequentially reamed going from 6 to 12 by 2 mm incriments. The 12 mm reamer was found to have appropriate cortical contact.  A   box osteotome was then used and a 12-mm broach.  The broach handle was   removed and the trial head was placed.   Calcar reamer was used.The eccentric 48 x 18 head fit best.  With the trial implantation of the component, there was   approximately 50% posterior translation with immediate snap back to the   anatomic position.  With forward elevation, there was no tendency   towards posterior subluxation.   The trial was removed and the final implant was prepared on a back  table.  The trial was removed and the final implant was prepared on a back table.   Small holes were drilled on both sides of the lesser tuberosity osteotomy, through which 3 Fibertapes were passed. The implant was then placed through the loop of all 3 Fibertapes and impacted with an excellent press-fit. This achieved excellent anatomic reconstruction of the proximal humerus.  The joint was then copiously irrigated with pulse lavage.  The subscapularis and   lesser tuberosity osteotomy were then repaired using the 3 Fibertapes previously passed.  The integrity of the axillary nerve was then verified with digital palpation of the nerve. One #1 Ethibond was placed at the rotator interval just above   the lesser tuberosity. Skin was closed  with 2-0 Vicryl sutures in the deep dermal layer and 4-0 Monocryl in a subcuticular  running fashion.  Sterile dressings were then applied including Steri- Strips, 4x4s, ABDs and tape.  The patient was placed in a sling and allowed to awaken from general anesthesia and taken to the recovery room in stable  condition.      POSTOPERATIVE PLAN:  Early passive range of motion will be allowed with the goal of 40 degrees external rotation and 140 degrees forward elevation.  No internal rotation at this time.  No active motion of the arm until the lesser tuberosity heals.  The patient will likely be kept in the hospital for 1-2 days and then discharged home.

## 2014-07-09 LAB — CBC
HCT: 38.5 % — ABNORMAL LOW (ref 39.0–52.0)
HEMOGLOBIN: 13 g/dL (ref 13.0–17.0)
MCH: 28 pg (ref 26.0–34.0)
MCHC: 33.8 g/dL (ref 30.0–36.0)
MCV: 83 fL (ref 78.0–100.0)
Platelets: 227 10*3/uL (ref 150–400)
RBC: 4.64 MIL/uL (ref 4.22–5.81)
RDW: 14 % (ref 11.5–15.5)
WBC: 7.7 10*3/uL (ref 4.0–10.5)

## 2014-07-09 LAB — BASIC METABOLIC PANEL
Anion gap: 15 (ref 5–15)
BUN: 9 mg/dL (ref 6–23)
CALCIUM: 8.8 mg/dL (ref 8.4–10.5)
CO2: 25 meq/L (ref 19–32)
CREATININE: 0.92 mg/dL (ref 0.50–1.35)
Chloride: 98 mEq/L (ref 96–112)
GFR calc Af Amer: >90 mL/min (ref >90)
GFR calc non Af Amer: 84 mL/min — ABNORMAL LOW (ref >90)
GLUCOSE: 145 mg/dL — AB (ref 70–99)
Potassium: 3.9 mEq/L (ref 3.7–5.3)
Sodium: 138 mEq/L (ref 137–147)

## 2014-07-09 LAB — SURGICAL PCR SCREEN
MRSA, PCR: NEGATIVE
STAPHYLOCOCCUS AUREUS: NEGATIVE

## 2014-07-09 LAB — GLUCOSE, CAPILLARY
GLUCOSE-CAPILLARY: 187 mg/dL — AB (ref 70–99)
Glucose-Capillary: 168 mg/dL — ABNORMAL HIGH (ref 70–99)

## 2014-07-09 MED ORDER — OXYCODONE-ACETAMINOPHEN 5-325 MG PO TABS: 1.0000 | ORAL_TABLET | ORAL | Status: DC | PRN

## 2014-07-09 MED ORDER — DOCUSATE SODIUM 100 MG PO CAPS: 100.0000 mg | ORAL_CAPSULE | Freq: Three times a day (TID) | ORAL | Status: DC | PRN

## 2014-07-09 NOTE — Evaluation (Signed)
Occupational Therapy Evaluation Patient Details Name: Brian Dominguez MRN: [id number] DOB: [birth year] Today's Date: 07/09/2014    History of Present Illness s/p L total shoulder arthroplasty   Clinical Impression   Pta pt was independent with self care tasks and now from above presents with acute pain and limited shoulder ROM interfering with his independence with ADLs. Educated pt on UB bathing/dressing technique and sequence, sleep position of LUE and sling wearing schedule. Instructed pt to do shoulder FF and ER, and elbow/wrist/hand AROM exercises. Pt unable to practice ADLs or exercises this AM due to nausea but made a plan to come back after lunch to practice. Will continue to follow.    Follow Up Recommendations  No OT follow up;Supervision - Intermittent          Precautions / Restrictions Precautions Precautions: Shoulder Shoulder Interventions: Shoulder sling/immobilizer;Off for dressing/bathing/exercises Precaution Booklet Issued: Yes (comment) Required Braces or Orthoses: Sling Restrictions Weight Bearing Restrictions: Yes LUE Weight Bearing: Non weight bearing      Mobility Bed Mobility               General bed mobility comments: Pt up in chair upon OT tx.  Transfers                 General transfer comment: Did not assess. Pt felt nauseous and wanted me to come back after lunch to go over ADLs    Balance Overall balance assessment: Needs assistance   Sitting balance-Leahy Scale: Good                                                      Pertinent Vitals/Pain No c/o pain during tx, pt c/o nausea. Nurse was notified.     Hand Dominance Right   Extremity/Trunk Assessment Upper Extremity Assessment Upper Extremity Assessment: Generalized weakness;LUE deficits/detail LUE Deficits / Details: acute pain and limited ROM LUE Coordination: decreased gross motor   Lower Extremity Assessment Lower Extremity Assessment:  Overall WFL for tasks assessed       Communication Communication Communication: No difficulties   Cognition Arousal/Alertness: Awake/alert Behavior During Therapy: WFL for tasks assessed/performed Overall Cognitive Status: Within Functional Limits for tasks assessed                        Exercises   Other Exercises Other Exercises: Instructed pt to do AAROM shoulder FF up to 140 deg, ER up to 40 deg and elbow/wrist/hand AROM exercises. Instructed pt to do 10 reps 5xday. Pt unable to demonstrate understanding due to nausea, will try in next session.   Shoulder Instructions Shoulder Instructions Donning/doffing shirt without moving shoulder:  (Educated) Method for sponge bathing under operated UE:  (Educated) Donning/doffing sling/immobilizer:  (Educated) Correct positioning of sling/immobilizer:  (Educated) ROM for elbow, wrist and digits of operated UE:  (Educated) Sling wearing schedule (on at all times/off for ADL's):  (Educated) Proper positioning of operated UE when showering:  (Educated) Positioning of UE while sleeping:  (Educated)    Home Living Family/patient expects to be discharged to:: Private residence Living Arrangements: Non-relatives/Friends (Rents out rooms to friends) Available Help at Discharge: Friend(s) Type of Home: House       Home Layout: One level     Bathroom Shower/Tub: Tub/shower unit Shower/tub characteristics: Engineer, building services: Standard  Home Equipment: None          Prior Functioning/Environment Level of Independence: Independent             OT Diagnosis: Generalized weakness;Acute pain   OT Problem List: Decreased strength;Decreased range of motion;Pain   OT Treatment/Interventions: Self-care/ADL training;Energy conservation    OT Goals(Current goals can be found in the care plan section) Acute Rehab OT Goals Patient Stated Goal: get stronger OT Goal Formulation: With patient Time For Goal Achievement:  07/16/14 Potential to Achieve Goals: Good ADL Goals Pt Will Perform Upper Body Bathing: with supervision;standing Pt Will Perform Upper Body Dressing: with supervision;standing Pt/caregiver will Perform Home Exercise Program: Left upper extremity;Independently;With written HEP provided Additional ADL Goal #1: Pt will demonstrate understanding of how to don/doff sling at a modified independent level  OT Frequency: Min 1X/week   Barriers to D/C: Decreased caregiver support             End of Session    Activity Tolerance: Other (comment) (Pt limited by nausea) Patient left: in chair;with call bell/phone within reach   Time:  -    Charges:    G-CodesMaurene Capes:    Parag Dorton 07/09/2014, 1:28 PM

## 2014-07-09 NOTE — Anesthesia Postprocedure Evaluation (Signed)
Anesthesia Post Note  Patient: Brian JakesJohn A Dominguez  Procedure(s) Performed: Procedure(s) (LRB): LEFT TOTAL SHOULDER ARTHROPLASTY (Left)  Anesthesia type: general  Patient location: PACU  Post pain: Pain level controlled  Post assessment: Patient's Cardiovascular Status Stable  Last Vitals:  Filed Vitals:   07/09/14 0456  BP: 131/64  Pulse: 91  Temp: 37.6 C  Resp: 18    Post vital signs: Reviewed and stable  Level of consciousness: sedated  Complications: No apparent anesthesia complications

## 2014-07-09 NOTE — Progress Notes (Signed)
OT Cancellation Note  Patient Details Name: Brian Dominguez MRN: [id number] DOB: 06/13/1946   Cancelled Treatment:    Reason Eval/Treat Not Completed: Pt had been discharged upon returning at the agreed upon time for him to demonstrate UB ADL technique, how to don/doff sling and practice HEP that I had verbally gone over with him this AM; however he was unable to perform this AM due to nausea.  Maurene CapesKeene, Audria Takeshita [phone] 07/09/2014, 4:14 PM
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
## 2014-07-09 NOTE — Progress Notes (Signed)
   PATIENT ID: Brian JakesJohn A Dominguez   1 Day Post-Op Procedure(s) (LRB): LEFT TOTAL SHOULDER ARTHROPLASTY (Left)  Subjective: Reports doing well today. Minimal pain, controlled with percocet. Feels ready to go home after OT. No complaints or concerns.   Objective:  Filed Vitals:   07/09/14 0456  BP: 131/64  Pulse: 91  Temp: 99.6 F (37.6 C)  Resp: 18     Awake, alert, orientated Up eating breakfast L UE dressing c/d/i Wiggles fingers, distally NVI  Labs:   Recent Labs  07/09/14 0531  HGB 13.0   Recent Labs  07/09/14 0531  WBC 7.7  RBC 4.64  HCT 38.5*  PLT 227   Recent Labs  07/09/14 0531  NA 138  K 3.9  CL 98  CO2 25  BUN 9  CREATININE 0.92  GLUCOSE 145*  CALCIUM 8.8    Assessment and Plan: 1 day s/p left TSA OT today, PROM limited to 140 deg FF and 40 deg ER-- went over exercises with patient briefly and had to reinforce no active ROM Percocet for pain control, script in chart Okay to d/c home today after OT  VTE proph: ASA 325mg  BID, SCDs

## 2014-07-09 NOTE — Evaluation (Signed)
I have read and agree with this note.   Time in/out:8:34-8:54 Total time: 20 minutes (1Ev, 1SC)  Ignacia Palmaathy Kasmira Cacioppo, OTR/L [phone]

## 2014-07-09 NOTE — Progress Notes (Signed)
I have read and agree with this note.   Cathy Emmilyn Crooke, OTR/L [phone]     

## 2014-07-12 ENCOUNTER — Encounter (HOSPITAL_COMMUNITY): Payer: Self-pay | Admitting: Orthopedic Surgery

## 2014-07-23 ENCOUNTER — Other Ambulatory Visit: Payer: Self-pay | Admitting: *Deleted

## 2014-07-27 ENCOUNTER — Other Ambulatory Visit: Payer: Self-pay | Admitting: *Deleted

## 2014-07-27 MED ORDER — AMLODIPINE BESYLATE 10 MG PO TABS: 10.0000 mg | ORAL_TABLET | Freq: Every day | ORAL | Status: DC

## 2014-07-27 MED ORDER — DOXAZOSIN MESYLATE 2 MG PO TABS: 4.0000 mg | ORAL_TABLET | Freq: Every day | ORAL | Status: DC

## 2014-08-13 ENCOUNTER — Other Ambulatory Visit: Payer: Self-pay | Admitting: *Deleted

## 2014-08-17 ENCOUNTER — Other Ambulatory Visit: Payer: Self-pay | Admitting: *Deleted

## 2014-08-17 MED ORDER — METFORMIN HCL 1000 MG PO TABS: 1000.0000 mg | ORAL_TABLET | Freq: Two times a day (BID) | ORAL | Status: DC

## 2014-08-17 MED ORDER — CETIRIZINE HCL 10 MG PO TABS: 10.0000 mg | ORAL_TABLET | Freq: Every day | ORAL | Status: DC | PRN

## 2014-08-26 ENCOUNTER — Ambulatory Visit: Payer: PRIVATE HEALTH INSURANCE | Attending: Orthopedic Surgery

## 2014-08-26 DIAGNOSIS — IMO0001 Reserved for inherently not codable concepts without codable children: Secondary | ICD-10-CM | POA: Insufficient documentation

## 2014-08-26 DIAGNOSIS — M25519 Pain in unspecified shoulder: Secondary | ICD-10-CM | POA: Insufficient documentation

## 2014-08-26 DIAGNOSIS — M25619 Stiffness of unspecified shoulder, not elsewhere classified: Secondary | ICD-10-CM | POA: Diagnosis not present

## 2014-08-31 ENCOUNTER — Ambulatory Visit: Payer: PRIVATE HEALTH INSURANCE | Admitting: Physical Therapy

## 2014-08-31 DIAGNOSIS — IMO0001 Reserved for inherently not codable concepts without codable children: Secondary | ICD-10-CM | POA: Diagnosis not present

## 2014-09-06 ENCOUNTER — Encounter: Payer: PRIVATE HEALTH INSURANCE | Admitting: Physical Therapy

## 2014-09-08 ENCOUNTER — Ambulatory Visit: Payer: PRIVATE HEALTH INSURANCE | Admitting: Physical Therapy

## 2014-09-16 ENCOUNTER — Emergency Department (HOSPITAL_COMMUNITY): Payer: PRIVATE HEALTH INSURANCE

## 2014-09-16 ENCOUNTER — Encounter (HOSPITAL_COMMUNITY): Payer: Self-pay | Admitting: Emergency Medicine

## 2014-09-16 ENCOUNTER — Emergency Department (HOSPITAL_COMMUNITY)
Admission: EM | Admit: 2014-09-16 | Discharge: 2014-09-16 | Disposition: A | Discharge status: Home / Self Care | Payer: PRIVATE HEALTH INSURANCE | Attending: Emergency Medicine | Admitting: Emergency Medicine

## 2014-09-16 DIAGNOSIS — E119 Type 2 diabetes mellitus without complications: Secondary | ICD-10-CM | POA: Insufficient documentation

## 2014-09-16 DIAGNOSIS — Z8619 Personal history of other infectious and parasitic diseases: Secondary | ICD-10-CM | POA: Diagnosis not present

## 2014-09-16 DIAGNOSIS — Z720 Tobacco use: Secondary | ICD-10-CM | POA: Insufficient documentation

## 2014-09-16 DIAGNOSIS — Z79899 Other long term (current) drug therapy: Secondary | ICD-10-CM | POA: Insufficient documentation

## 2014-09-16 DIAGNOSIS — R1013 Epigastric pain: Secondary | ICD-10-CM | POA: Diagnosis present

## 2014-09-16 DIAGNOSIS — I10 Essential (primary) hypertension: Secondary | ICD-10-CM | POA: Insufficient documentation

## 2014-09-16 DIAGNOSIS — K5901 Slow transit constipation: Secondary | ICD-10-CM | POA: Diagnosis not present

## 2014-09-16 DIAGNOSIS — Z7951 Long term (current) use of inhaled steroids: Secondary | ICD-10-CM | POA: Diagnosis not present

## 2014-09-16 DIAGNOSIS — Z87448 Personal history of other diseases of urinary system: Secondary | ICD-10-CM | POA: Insufficient documentation

## 2014-09-16 DIAGNOSIS — Z87828 Personal history of other (healed) physical injury and trauma: Secondary | ICD-10-CM | POA: Insufficient documentation

## 2014-09-16 DIAGNOSIS — K219 Gastro-esophageal reflux disease without esophagitis: Secondary | ICD-10-CM | POA: Insufficient documentation

## 2014-09-16 DIAGNOSIS — Z8709 Personal history of other diseases of the respiratory system: Secondary | ICD-10-CM | POA: Diagnosis not present

## 2014-09-16 DIAGNOSIS — Z9049 Acquired absence of other specified parts of digestive tract: Secondary | ICD-10-CM | POA: Insufficient documentation

## 2014-09-16 DIAGNOSIS — M1990 Unspecified osteoarthritis, unspecified site: Secondary | ICD-10-CM | POA: Insufficient documentation

## 2014-09-16 DIAGNOSIS — Z9889 Other specified postprocedural states: Secondary | ICD-10-CM | POA: Diagnosis not present

## 2014-09-16 LAB — CBC WITH DIFFERENTIAL/PLATELET
Basophils Absolute: 0 10*3/uL (ref 0.0–0.1)
Basophils Relative: 0 % (ref 0–1)
Eosinophils Absolute: 0.3 10*3/uL (ref 0.0–0.7)
Eosinophils Relative: 5 % (ref 0–5)
HCT: 43.7 % (ref 39.0–52.0)
HEMOGLOBIN: 15 g/dL (ref 13.0–17.0)
LYMPHS ABS: 2.2 10*3/uL (ref 0.7–4.0)
Lymphocytes Relative: 33 % (ref 12–46)
MCH: 27.8 pg (ref 26.0–34.0)
MCHC: 34.3 g/dL (ref 30.0–36.0)
MCV: 81.1 fL (ref 78.0–100.0)
MONOS PCT: 9 % (ref 3–12)
Monocytes Absolute: 0.6 10*3/uL (ref 0.1–1.0)
NEUTROS PCT: 53 % (ref 43–77)
Neutro Abs: 3.5 10*3/uL (ref 1.7–7.7)
Platelets: 282 10*3/uL (ref 150–400)
RBC: 5.39 MIL/uL (ref 4.22–5.81)
RDW: 14.3 % (ref 11.5–15.5)
WBC: 6.6 10*3/uL (ref 4.0–10.5)

## 2014-09-16 LAB — COMPREHENSIVE METABOLIC PANEL
ALT: 28 U/L (ref 0–53)
ANION GAP: 14 (ref 5–15)
AST: 21 U/L (ref 0–37)
Albumin: 4.4 g/dL (ref 3.5–5.2)
Alkaline Phosphatase: 65 U/L (ref 39–117)
BILIRUBIN TOTAL: 0.4 mg/dL (ref 0.3–1.2)
BUN: 11 mg/dL (ref 6–23)
CO2: 27 mEq/L (ref 19–32)
Calcium: 9.8 mg/dL (ref 8.4–10.5)
Chloride: 98 mEq/L (ref 96–112)
Creatinine, Ser: 0.89 mg/dL (ref 0.50–1.35)
GFR calc Af Amer: >90 mL/min (ref >90)
GFR calc non Af Amer: 85 mL/min — ABNORMAL LOW (ref >90)
GLUCOSE: 190 mg/dL — AB (ref 70–99)
Potassium: 3.8 mEq/L (ref 3.7–5.3)
SODIUM: 139 meq/L (ref 137–147)
Total Protein: 8 g/dL (ref 6.0–8.3)

## 2014-09-16 LAB — URINALYSIS, ROUTINE W REFLEX MICROSCOPIC
BILIRUBIN URINE: NEGATIVE
Glucose, UA: NEGATIVE mg/dL
HGB URINE DIPSTICK: NEGATIVE
KETONES UR: NEGATIVE mg/dL
Leukocytes, UA: NEGATIVE
NITRITE: NEGATIVE
PH: 7 (ref 5.0–8.0)
Protein, ur: NEGATIVE mg/dL
Specific Gravity, Urine: 1.022 (ref 1.005–1.030)
Urobilinogen, UA: 0.2 mg/dL (ref 0.0–1.0)

## 2014-09-16 MED ORDER — SUCRALFATE 1 GM/10ML PO SUSP: 1.0000 g | Freq: Three times a day (TID) | ORAL | Status: DC

## 2014-09-16 MED ORDER — POLYETHYLENE GLYCOL 3350 17 GM/SCOOP PO POWD: 1.0000 | Freq: Once | ORAL | Status: DC

## 2014-09-16 MED ORDER — GI COCKTAIL ~~LOC~~
30.0000 mL | Freq: Once | ORAL | Status: AC
  Administered 2014-09-16: 30 mL via ORAL
  Filled 2014-09-16: qty 30

## 2014-09-16 MED ORDER — ONDANSETRON HCL 4 MG/2ML IJ SOLN
4.0000 mg | Freq: Once | INTRAMUSCULAR | Status: AC
  Administered 2014-09-16: 4 mg via INTRAVENOUS
  Filled 2014-09-16: qty 2

## 2014-09-16 NOTE — ED Notes (Signed)
Pt aware of need for urine specimen; unable at this time; call bell within reach; iv patent

## 2014-09-16 NOTE — ED Notes (Signed)
Per pt, right abdominal pain-nausea no vomiting-history of GERD

## 2014-09-16 NOTE — ED Notes (Signed)
Pt c/o pain right upper quadrant x 2 wks; nausea; no vomiting; contributing pain to intake of pain meds due to left shoulder reconstruction in July; last bowel movement two days ago--normal; occasional loose stool; taking pepto bismol occasionally for abd pain; reflux at times
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
## 2014-09-16 NOTE — Discharge Instructions (Signed)

## 2014-09-20 ENCOUNTER — Encounter: Payer: PRIVATE HEALTH INSURANCE | Admitting: Physical Therapy

## 2014-09-21 ENCOUNTER — Other Ambulatory Visit: Payer: Self-pay | Admitting: *Deleted

## 2014-09-21 MED ORDER — GLUCOSE BLOOD VI STRP: ORAL_STRIP | Status: DC

## 2014-09-21 NOTE — Telephone Encounter (Signed)
Rx request is for One Touch Ultra Blue Test Strips.  Clovis PuMartin, Owain Eckerman L, RN

## 2014-09-22 ENCOUNTER — Encounter: Payer: PRIVATE HEALTH INSURANCE | Admitting: Rehabilitation

## 2014-09-22 ENCOUNTER — Ambulatory Visit: Payer: PRIVATE HEALTH INSURANCE | Attending: Orthopedic Surgery | Admitting: Physical Therapy

## 2014-09-22 DIAGNOSIS — M25612 Stiffness of left shoulder, not elsewhere classified: Secondary | ICD-10-CM | POA: Insufficient documentation

## 2014-09-22 DIAGNOSIS — M25512 Pain in left shoulder: Secondary | ICD-10-CM | POA: Diagnosis not present

## 2014-09-22 DIAGNOSIS — Z5189 Encounter for other specified aftercare: Secondary | ICD-10-CM | POA: Insufficient documentation

## 2014-09-27 ENCOUNTER — Encounter: Payer: PRIVATE HEALTH INSURANCE | Admitting: Rehabilitation

## 2014-09-27 ENCOUNTER — Ambulatory Visit: Payer: PRIVATE HEALTH INSURANCE | Admitting: Rehabilitation

## 2014-09-27 ENCOUNTER — Other Ambulatory Visit: Payer: Self-pay | Admitting: *Deleted

## 2014-09-27 DIAGNOSIS — Z5189 Encounter for other specified aftercare: Secondary | ICD-10-CM | POA: Diagnosis not present

## 2014-09-27 NOTE — Telephone Encounter (Signed)
One Touch Ultra Blue Test Strips.  Clovis PuMartin, Creed Kail L, RN

## 2014-09-29 ENCOUNTER — Telehealth: Payer: Self-pay | Admitting: *Deleted

## 2014-09-29 ENCOUNTER — Encounter: Payer: PRIVATE HEALTH INSURANCE | Admitting: Rehabilitation

## 2014-09-29 ENCOUNTER — Ambulatory Visit: Payer: PRIVATE HEALTH INSURANCE

## 2014-09-29 DIAGNOSIS — Z5189 Encounter for other specified aftercare: Secondary | ICD-10-CM | POA: Diagnosis not present

## 2014-09-29 MED ORDER — GLUCOSE BLOOD VI STRP: ORAL_STRIP | Status: DC

## 2014-09-29 NOTE — Telephone Encounter (Signed)
[facility] need the Diagnosis/ ICD-10 code and the exact directions to bill medicare for the One Touch Ultra test strips.  Please send in new Rx.  Clovis PuMartin, Tamika L, RN

## 2014-10-01 NOTE — Telephone Encounter (Signed)
They will require a provider signature as well to bill insurance.  Clovis PuMartin, Bernetta Sutley L, RN

## 2014-10-04 ENCOUNTER — Ambulatory Visit: Payer: PRIVATE HEALTH INSURANCE

## 2014-10-04 DIAGNOSIS — Z5189 Encounter for other specified aftercare: Secondary | ICD-10-CM | POA: Diagnosis not present

## 2014-10-07 ENCOUNTER — Ambulatory Visit: Payer: PRIVATE HEALTH INSURANCE | Admitting: Physical Therapy

## 2014-10-07 DIAGNOSIS — Z5189 Encounter for other specified aftercare: Secondary | ICD-10-CM | POA: Diagnosis not present

## 2014-10-11 ENCOUNTER — Ambulatory Visit: Payer: PRIVATE HEALTH INSURANCE | Attending: Orthopedic Surgery | Admitting: Physical Therapy

## 2014-10-11 DIAGNOSIS — M25512 Pain in left shoulder: Secondary | ICD-10-CM | POA: Diagnosis not present

## 2014-10-11 DIAGNOSIS — M25612 Stiffness of left shoulder, not elsewhere classified: Secondary | ICD-10-CM | POA: Diagnosis not present

## 2014-10-11 DIAGNOSIS — Z5189 Encounter for other specified aftercare: Secondary | ICD-10-CM | POA: Diagnosis not present
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
## 2014-10-13 ENCOUNTER — Ambulatory Visit: Payer: PRIVATE HEALTH INSURANCE | Admitting: Physical Therapy

## 2014-10-13 DIAGNOSIS — M25612 Stiffness of left shoulder, not elsewhere classified: Secondary | ICD-10-CM

## 2014-10-13 DIAGNOSIS — Z5189 Encounter for other specified aftercare: Secondary | ICD-10-CM | POA: Diagnosis not present

## 2014-10-13 DIAGNOSIS — M25512 Pain in left shoulder: Secondary | ICD-10-CM
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
## 2014-10-13 NOTE — Patient Instructions (Signed)
RICE Handout issued reviewed

## 2014-10-15 ENCOUNTER — Emergency Department (HOSPITAL_COMMUNITY): Payer: PRIVATE HEALTH INSURANCE

## 2014-10-15 ENCOUNTER — Emergency Department (HOSPITAL_COMMUNITY)
Admission: EM | Admit: 2014-10-15 | Discharge: 2014-10-15 | Disposition: A | Discharge status: Home / Self Care | Payer: PRIVATE HEALTH INSURANCE | Attending: Emergency Medicine | Admitting: Emergency Medicine

## 2014-10-15 DIAGNOSIS — K219 Gastro-esophageal reflux disease without esophagitis: Secondary | ICD-10-CM | POA: Insufficient documentation

## 2014-10-15 DIAGNOSIS — Z79899 Other long term (current) drug therapy: Secondary | ICD-10-CM | POA: Diagnosis not present

## 2014-10-15 DIAGNOSIS — Z87828 Personal history of other (healed) physical injury and trauma: Secondary | ICD-10-CM | POA: Insufficient documentation

## 2014-10-15 DIAGNOSIS — E119 Type 2 diabetes mellitus without complications: Secondary | ICD-10-CM | POA: Insufficient documentation

## 2014-10-15 DIAGNOSIS — K529 Noninfective gastroenteritis and colitis, unspecified: Secondary | ICD-10-CM | POA: Diagnosis not present

## 2014-10-15 DIAGNOSIS — M1990 Unspecified osteoarthritis, unspecified site: Secondary | ICD-10-CM | POA: Insufficient documentation

## 2014-10-15 DIAGNOSIS — Z87448 Personal history of other diseases of urinary system: Secondary | ICD-10-CM | POA: Insufficient documentation

## 2014-10-15 DIAGNOSIS — Z720 Tobacco use: Secondary | ICD-10-CM | POA: Insufficient documentation

## 2014-10-15 DIAGNOSIS — Z8619 Personal history of other infectious and parasitic diseases: Secondary | ICD-10-CM | POA: Diagnosis not present

## 2014-10-15 DIAGNOSIS — R110 Nausea: Secondary | ICD-10-CM | POA: Diagnosis not present

## 2014-10-15 DIAGNOSIS — R1011 Right upper quadrant pain: Secondary | ICD-10-CM | POA: Diagnosis present

## 2014-10-15 DIAGNOSIS — Z9089 Acquired absence of other organs: Secondary | ICD-10-CM | POA: Diagnosis not present

## 2014-10-15 DIAGNOSIS — I10 Essential (primary) hypertension: Secondary | ICD-10-CM | POA: Diagnosis not present

## 2014-10-15 DIAGNOSIS — R109 Unspecified abdominal pain: Secondary | ICD-10-CM

## 2014-10-15 LAB — COMPREHENSIVE METABOLIC PANEL
ALBUMIN: 4.5 g/dL (ref 3.5–5.2)
ALK PHOS: 61 U/L (ref 39–117)
ALT: 12 U/L (ref 0–53)
ANION GAP: 17 — AB (ref 5–15)
AST: 15 U/L (ref 0–37)
BILIRUBIN TOTAL: 0.5 mg/dL (ref 0.3–1.2)
BUN: 10 mg/dL (ref 6–23)
CHLORIDE: 99 meq/L (ref 96–112)
CO2: 26 mEq/L (ref 19–32)
CREATININE: 0.79 mg/dL (ref 0.50–1.35)
Calcium: 10.4 mg/dL (ref 8.4–10.5)
GFR calc non Af Amer: 90 mL/min — ABNORMAL LOW (ref >90)
GLUCOSE: 152 mg/dL — AB (ref 70–99)
POTASSIUM: 3.7 meq/L (ref 3.7–5.3)
Sodium: 142 mEq/L (ref 137–147)
Total Protein: 8.3 g/dL (ref 6.0–8.3)

## 2014-10-15 LAB — URINALYSIS, ROUTINE W REFLEX MICROSCOPIC
Bilirubin Urine: NEGATIVE
GLUCOSE, UA: NEGATIVE mg/dL
HGB URINE DIPSTICK: NEGATIVE
Ketones, ur: NEGATIVE mg/dL
LEUKOCYTES UA: NEGATIVE
Nitrite: NEGATIVE
Protein, ur: NEGATIVE mg/dL
Specific Gravity, Urine: 1.027 (ref 1.005–1.030)
UROBILINOGEN UA: 1 mg/dL (ref 0.0–1.0)
pH: 6 (ref 5.0–8.0)

## 2014-10-15 LAB — CBC WITH DIFFERENTIAL/PLATELET
BASOS ABS: 0 10*3/uL (ref 0.0–0.1)
Basophils Relative: 1 % (ref 0–1)
EOS ABS: 0.3 10*3/uL (ref 0.0–0.7)
Eosinophils Relative: 5 % (ref 0–5)
HCT: 43.6 % (ref 39.0–52.0)
HEMOGLOBIN: 15.2 g/dL (ref 13.0–17.0)
Lymphocytes Relative: 43 % (ref 12–46)
Lymphs Abs: 2.4 10*3/uL (ref 0.7–4.0)
MCH: 27.9 pg (ref 26.0–34.0)
MCHC: 34.9 g/dL (ref 30.0–36.0)
MCV: 80.1 fL (ref 78.0–100.0)
Monocytes Absolute: 0.4 10*3/uL (ref 0.1–1.0)
Monocytes Relative: 7 % (ref 3–12)
NEUTROS ABS: 2.5 10*3/uL (ref 1.7–7.7)
Neutrophils Relative %: 44 % (ref 43–77)
PLATELETS: 294 10*3/uL (ref 150–400)
RBC: 5.44 MIL/uL (ref 4.22–5.81)
RDW: 14.9 % (ref 11.5–15.5)
WBC: 5.6 10*3/uL (ref 4.0–10.5)

## 2014-10-15 LAB — LIPASE, BLOOD: LIPASE: 115 U/L — AB (ref 11–59)

## 2014-10-15 MED ORDER — ONDANSETRON 4 MG PO TBDP
ORAL_TABLET | ORAL | Status: DC
Start: 2014-10-15 — End: 2014-11-24

## 2014-10-15 MED ORDER — IOHEXOL 300 MG/ML  SOLN
50.0000 mL | Freq: Once | INTRAMUSCULAR | Status: AC | PRN
  Administered 2014-10-15: 50 mL via ORAL

## 2014-10-15 MED ORDER — IOHEXOL 300 MG/ML  SOLN
100.0000 mL | Freq: Once | INTRAMUSCULAR | Status: AC | PRN
  Administered 2014-10-15: 100 mL via INTRAVENOUS

## 2014-10-15 MED ORDER — GI COCKTAIL ~~LOC~~
30.0000 mL | Freq: Once | ORAL | Status: AC
  Administered 2014-10-15: 30 mL via ORAL
  Filled 2014-10-15: qty 30

## 2014-10-15 NOTE — ED Notes (Signed)
Pt from home reports that he was seen here for the same a few days ago, R flank pain. Pt sts that last pm he ate a "hotdog" and then started to have R flank. Pt denies urinary s/sx, N/V/D. Pt is A&O and in NAD

## 2014-10-15 NOTE — Discharge Instructions (Signed)
Abdominal Pain  Many things can cause abdominal pain. Usually, abdominal pain is not caused by a disease and will improve without treatment. It can often be observed and treated at home. Your health care provider will do a physical exam and possibly order blood tests and X-rays to help determine the seriousness of your pain. However, in many cases, more time must pass before a clear cause of the pain can be found. Before that point, your health care provider may not know if you need more testing or further treatment.  HOME CARE INSTRUCTIONS   Monitor your abdominal pain for any changes. The following actions may help to alleviate any discomfort you are experiencing:   Only take over-the-counter or prescription medicines as directed by your health care provider.   Do not take laxatives unless directed to do so by your health care provider.   Try a clear liquid diet (broth, tea, or water) as directed by your health care provider. Slowly move to a bland diet as tolerated.  SEEK MEDICAL CARE IF:   You have unexplained abdominal pain.   You have abdominal pain associated with nausea or diarrhea.   You have pain when you urinate or have a bowel movement.   You experience abdominal pain that wakes you in the night.   You have abdominal pain that is worsened or improved by eating food.   You have abdominal pain that is worsened with eating fatty foods.   You have a fever.  SEEK IMMEDIATE MEDICAL CARE IF:    Your pain does not go away within 2 hours.   You keep throwing up (vomiting).   Your pain is felt only in portions of the abdomen, such as the right side or the left lower portion of the abdomen.   You pass bloody or black tarry stools.  MAKE SURE YOU:   Understand these instructions.    Will watch your condition.    Will get help right away if you are not doing well or get worse.   Document Released: 09/05/2005 Document Revised: 12/01/2013 Document Reviewed: 08/05/2013  ExitCare Patient Information  2015 [employer]. This information is not intended to replace advice given to you by your health care provider. Make sure you discuss any questions you have with your health care provider.  Colitis  Colitis is inflammation of the colon. Colitis can be a short-term or long-standing (chronic) illness. Crohn's disease and ulcerative colitis are 2 types of colitis which are chronic. They usually require lifelong treatment.  CAUSES   There are many different causes of colitis, including:   Viruses.   Germs (bacteria).   Medicine reactions.  SYMPTOMS    Diarrhea.   Intestinal bleeding.   Pain.   Fever.   Throwing up (vomiting).   Tiredness (fatigue).   Weight loss.   Bowel blockage.  DIAGNOSIS   The diagnosis of colitis is based on examination and stool or blood tests. X-rays, CT scan, and colonoscopy may also be needed.  TREATMENT   Treatment may include:   Fluids given through the vein (intravenously).   Bowel rest (nothing to eat or drink for a period of time).   Medicine for pain and diarrhea.   Medicines (antibiotics) that kill germs.   Cortisone medicines.   Surgery.  HOME CARE INSTRUCTIONS    Get plenty of rest.   Drink enough water and fluids to keep your urine clear or pale yellow.   Eat a well-balanced diet.   Call your caregiver   for follow-up as recommended.  SEEK IMMEDIATE MEDICAL CARE IF:    You develop chills.   You have an oral temperature above 102 F (38.9 C), not controlled by medicine.   You have extreme weakness, fainting, or dehydration.   You have repeated vomiting.   You develop severe belly (abdominal) pain or are passing bloody or tarry stools.  MAKE SURE YOU:    Understand these instructions.   Will watch your condition.   Will get help right away if you are not doing well or get worse.  Document Released: 01/03/2005 Document Revised: 02/18/2012 Document Reviewed: 03/31/2010  ExitCare Patient Information 2015 [employer]. This information is not intended to  replace advice given to you by your health care provider. Make sure you discuss any questions you have with your health care provider.
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
## 2014-10-15 NOTE — ED Notes (Signed)
Pt in CT.

## 2014-10-15 NOTE — ED Notes (Signed)
Pt states abdominal pain has been going on x 1 month, nausea, denies vomiting.

## 2014-10-15 NOTE — ED Notes (Signed)
Pt provided with sandwich and diet ginger ale prior to leaving

## 2014-10-18 LAB — URINE CULTURE
Colony Count: NO GROWTH
Culture: NO GROWTH

## 2014-10-25 ENCOUNTER — Telehealth: Payer: Self-pay | Admitting: *Deleted

## 2014-10-25 NOTE — Telephone Encounter (Signed)
Brian Dominguez, Scientist, research (physical sciences)nursing practictioner with patient insurance out seeing patient today calling to report patient A1C of 7.3. Patient otherwise doing well. FYI to PCP.

## 2014-10-26 ENCOUNTER — Ambulatory Visit: Payer: PRIVATE HEALTH INSURANCE

## 2014-10-26 DIAGNOSIS — M25512 Pain in left shoulder: Secondary | ICD-10-CM

## 2014-10-26 DIAGNOSIS — Z5189 Encounter for other specified aftercare: Secondary | ICD-10-CM | POA: Diagnosis not present

## 2014-10-26 DIAGNOSIS — M25612 Stiffness of left shoulder, not elsewhere classified: Secondary | ICD-10-CM

## 2014-10-26 NOTE — Patient Instructions (Signed)
Discuss the protracted nature of strengthening in cases of TSA and he should be patient and contiue to work hard to improve strength.
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
## 2014-10-28 ENCOUNTER — Ambulatory Visit: Payer: PRIVATE HEALTH INSURANCE | Admitting: Rehabilitation

## 2014-10-28 DIAGNOSIS — Z5189 Encounter for other specified aftercare: Secondary | ICD-10-CM | POA: Diagnosis not present

## 2014-10-28 DIAGNOSIS — M25512 Pain in left shoulder: Secondary | ICD-10-CM

## 2014-10-28 DIAGNOSIS — M25612 Stiffness of left shoulder, not elsewhere classified: Secondary | ICD-10-CM

## 2014-10-28 NOTE — Patient Instructions (Signed)
Strengthening: Resisted Internal Rotation   Hold tubing in left hand, elbow at side and forearm out. Rotate forearm in across body. Repeat __10__ times per set. Do _1___ sets per session. Do __1-2__ sessions per day.  http://orth.exer.us/830   Copyright  VHI. All rights reserved.  Strengthening: Resisted External Rotation   Hold tubing in right hand, elbow at side and forearm across body. Rotate forearm out. Repeat _10___ times per set. Do _1___ sets per session. Do 1-2____ sessions per day.  http://orth.exer.us/828   Copyright  VHI. All rights reserved.
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
## 2014-11-01 ENCOUNTER — Ambulatory Visit: Payer: PRIVATE HEALTH INSURANCE

## 2014-11-01 DIAGNOSIS — R29898 Other symptoms and signs involving the musculoskeletal system: Secondary | ICD-10-CM

## 2014-11-01 DIAGNOSIS — Z5189 Encounter for other specified aftercare: Secondary | ICD-10-CM | POA: Diagnosis not present

## 2014-11-01 DIAGNOSIS — M25512 Pain in left shoulder: Secondary | ICD-10-CM
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
## 2014-11-03 ENCOUNTER — Ambulatory Visit: Payer: PRIVATE HEALTH INSURANCE | Admitting: Physical Therapy

## 2014-11-08 ENCOUNTER — Other Ambulatory Visit: Payer: Self-pay | Admitting: *Deleted

## 2014-11-08 ENCOUNTER — Ambulatory Visit: Payer: PRIVATE HEALTH INSURANCE | Admitting: Rehabilitation

## 2014-11-08 DIAGNOSIS — M25612 Stiffness of left shoulder, not elsewhere classified: Secondary | ICD-10-CM

## 2014-11-08 DIAGNOSIS — M25512 Pain in left shoulder: Secondary | ICD-10-CM

## 2014-11-08 DIAGNOSIS — Z5189 Encounter for other specified aftercare: Secondary | ICD-10-CM | POA: Diagnosis not present

## 2014-11-08 DIAGNOSIS — R29898 Other symptoms and signs involving the musculoskeletal system: Secondary | ICD-10-CM

## 2014-11-08 MED ORDER — AMLODIPINE BESYLATE 10 MG PO TABS: 10.0000 mg | ORAL_TABLET | Freq: Every day | ORAL | Status: DC
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
## 2014-11-11 ENCOUNTER — Encounter: Payer: PRIVATE HEALTH INSURANCE | Admitting: Physical Therapy

## 2014-11-12 ENCOUNTER — Ambulatory Visit: Payer: PRIVATE HEALTH INSURANCE | Attending: Orthopedic Surgery | Admitting: Physical Therapy

## 2014-11-12 DIAGNOSIS — M25512 Pain in left shoulder: Secondary | ICD-10-CM | POA: Insufficient documentation

## 2014-11-12 DIAGNOSIS — Z5189 Encounter for other specified aftercare: Secondary | ICD-10-CM | POA: Insufficient documentation

## 2014-11-12 DIAGNOSIS — M25612 Stiffness of left shoulder, not elsewhere classified: Secondary | ICD-10-CM | POA: Insufficient documentation

## 2014-11-15 ENCOUNTER — Ambulatory Visit: Payer: PRIVATE HEALTH INSURANCE

## 2014-11-15 DIAGNOSIS — Z5189 Encounter for other specified aftercare: Secondary | ICD-10-CM | POA: Diagnosis not present

## 2014-11-15 DIAGNOSIS — M25512 Pain in left shoulder: Secondary | ICD-10-CM | POA: Diagnosis not present

## 2014-11-15 DIAGNOSIS — Z96612 Presence of left artificial shoulder joint: Secondary | ICD-10-CM

## 2014-11-15 DIAGNOSIS — M25612 Stiffness of left shoulder, not elsewhere classified: Secondary | ICD-10-CM | POA: Diagnosis not present
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
## 2014-11-15 NOTE — Patient Instructions (Signed)
Cane Overhead - Standing PALMS FACING UP   With arms straight, hold cane forward at waist. Raise cane above head. Hold _5__ seconds. Repeat 10___ times. Do 2- 3 sets per session. Do _3__ times per day.  Copyright  VHI. All rights reserved.    Arm Slide (Standing)   Stand against wall, upper arms at shoulder level, elbows bent to 90. Raise arms over head, keeping arms against wall. Repeat _5-10___ times per set. Do __2__ sets per session. Do __3__ sessions per day.  http://orth.exer.us/938   Copyright  VHI. All rights reserved.   Shrug With Internal Rotation: Side-Lying   Lying on R side. With elbow at side and forearm up, stabilize scapula by moving it up toward ear and squeezing it toward spine. Hold position and lower forearm. HOLD a can of soup. Do _10__ times, do 2-3 sets per session. Do  _3__ times per day.  http://ss.exer.us/328   Copyright  VHI. All rights reserved.

## 2014-11-16 ENCOUNTER — Other Ambulatory Visit: Payer: Self-pay | Admitting: *Deleted

## 2014-11-17 ENCOUNTER — Ambulatory Visit: Payer: PRIVATE HEALTH INSURANCE

## 2014-11-17 DIAGNOSIS — Z96612 Presence of left artificial shoulder joint: Secondary | ICD-10-CM

## 2014-11-17 DIAGNOSIS — Z5189 Encounter for other specified aftercare: Secondary | ICD-10-CM | POA: Diagnosis not present

## 2014-11-17 MED ORDER — METFORMIN HCL 1000 MG PO TABS
1000.0000 mg | ORAL_TABLET | Freq: Two times a day (BID) | ORAL | Status: DC
Start: 2014-11-17 — End: 2015-03-11
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
## 2014-11-22 ENCOUNTER — Ambulatory Visit: Payer: PRIVATE HEALTH INSURANCE

## 2014-11-22 DIAGNOSIS — Z5189 Encounter for other specified aftercare: Secondary | ICD-10-CM | POA: Diagnosis not present

## 2014-11-22 DIAGNOSIS — M25512 Pain in left shoulder: Secondary | ICD-10-CM
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
## 2014-11-24 ENCOUNTER — Encounter: Payer: Self-pay | Admitting: Family Medicine

## 2014-11-24 ENCOUNTER — Ambulatory Visit (INDEPENDENT_AMBULATORY_CARE_PROVIDER_SITE_OTHER): Payer: PRIVATE HEALTH INSURANCE | Admitting: Family Medicine

## 2014-11-24 ENCOUNTER — Ambulatory Visit (INDEPENDENT_AMBULATORY_CARE_PROVIDER_SITE_OTHER): Payer: PRIVATE HEALTH INSURANCE | Admitting: *Deleted

## 2014-11-24 VITALS — BP 153/82 | HR 91 | Temp 99.5°F | Ht 74.0 in | Wt 239.3 lb

## 2014-11-24 DIAGNOSIS — I10 Essential (primary) hypertension: Secondary | ICD-10-CM

## 2014-11-24 DIAGNOSIS — E119 Type 2 diabetes mellitus without complications: Secondary | ICD-10-CM

## 2014-11-24 DIAGNOSIS — Z23 Encounter for immunization: Secondary | ICD-10-CM

## 2014-11-24 DIAGNOSIS — R1031 Right lower quadrant pain: Secondary | ICD-10-CM

## 2014-11-24 LAB — POCT GLYCOSYLATED HEMOGLOBIN (HGB A1C): HEMOGLOBIN A1C: 7

## 2014-11-24 MED ORDER — ONDANSETRON 4 MG PO TBDP: ORAL_TABLET | ORAL | Status: DC

## 2014-11-24 MED ORDER — POLYETHYLENE GLYCOL 3350 17 GM/SCOOP PO POWD: 17.0000 g | Freq: Two times a day (BID) | ORAL | Status: DC

## 2014-11-24 NOTE — Progress Notes (Signed)
    Subjective    Brian Dominguez is a 68 y.o. male that presents for an office visit.   1. Diabetes: Adherent with regimen. States he has some issues with tolerance of metformin as he is having some abdominal pain similar to when he started taking the medication. He is, however, wanting to continue the metformin. No symptoms of hypoglycemia.  2. Abdominal pain: This has been a chronic problem that worsened in the last two months. He has been to the ED for this problem and was found to have colitis via CT scan. States he has associated constipation which he first noticed when he was taking opiates for a shoulder surgery. He has been taking Colace three times per day. Last bowel movement was two days ago. Appearance was normal. Some nausea every day and is not vomiting. Has some right sided abdominal pain infrequently. No fever or chills. No hematochezia. States that his symptoms are similar to when he started metformin. Has used Miralax 3 times per day in the past which did not help.  3. Hypertension: Adherent with amlodipine and hydrochlorothiazide. Has taken medication today. States no issues with vision, no headaches, no chest pain and no shortness of breath.  History  Substance Use Topics  . Smoking status: Current Every Day Smoker -- 0.50 packs/day for 35 years    Types: Cigarettes  . Smokeless tobacco: Never Used     Comment: trying to quitt quit for a while 10 yrs smoking now  . Alcohol Use: No    Allergies  Allergen Reactions  . Enalapril Swelling    Angioedema of the lips with enalapril    No orders of the defined types were placed in this encounter.    ROS  Per HPI   Objective   BP 153/82 mmHg  Pulse 91  Temp(Src) 99.5 F (37.5 C) (Oral)  Ht 6\' 2"  (1.88 m)  Wt 239 lb 4.8 oz (108.546 kg)  BMI 30.71 kg/m2  SpO2 100%  General: Well appearing male, in no distress Gastrointestinal: soft, non-tender, non-distended abdomen. No rebound or guarding  Assessment and Plan     Please refer to problem based charting of assessment and plan

## 2014-11-24 NOTE — Patient Instructions (Signed)
Thank you for coming to see me today. It was a pleasure. Today we talked about:   Abdominal pain: I will refer you to the GI doctor for a colonoscopy.  Diabetes: Please continue using metformin. I am referring you to the eye doctor  High blood pressure: your blood pressure was high, please make an appointment to see the nurse for a blood pressure check next week  Please make an appointment to see me in 4 weeks for follow-up.  If you have any questions or concerns, please do not hesitate to call the office at [phone].  Sincerely,  Brian Hawkingalph Emrick Hensch, MD

## 2014-11-25 DIAGNOSIS — R109 Unspecified abdominal pain: Secondary | ICD-10-CM | POA: Insufficient documentation

## 2014-11-25 NOTE — Assessment & Plan Note (Signed)
A1C improved to 7. Foot exam unremarkable.   Continue current regimen  Refer to opthalmology

## 2014-11-25 NOTE — Assessment & Plan Note (Signed)
Recent CT significant for colitis. Patient with recent EGD in June and colonoscopy in 2012 significant for hemorrhoids and multiple polyps. Multiple abdominal surgeries, including cholecystectomy (confirmed on CT). Suspect pain is due to colitis. No diarrhea, hematochezia or constitutional symptoms  Refer to GI for diagnostic colonoscopy  Zofran for nausea

## 2014-11-25 NOTE — Assessment & Plan Note (Signed)
Patient states he is adherent to medication. He states his blood pressure is controlled at home and is always elevated in the doctor's office. Home blood pressure readings are in the 140s/70s. Elevated today, however no red flags.  Recheck blood pressure at nurses visit next week  May need to consider addition of another antihypertensive, such as hydralazine

## 2014-11-26 ENCOUNTER — Ambulatory Visit: Payer: PRIVATE HEALTH INSURANCE | Admitting: Physical Therapy

## 2014-11-26 DIAGNOSIS — R29898 Other symptoms and signs involving the musculoskeletal system: Secondary | ICD-10-CM

## 2014-11-26 DIAGNOSIS — M25612 Stiffness of left shoulder, not elsewhere classified: Secondary | ICD-10-CM

## 2014-11-26 DIAGNOSIS — M25512 Pain in left shoulder: Secondary | ICD-10-CM

## 2014-11-26 DIAGNOSIS — Z5189 Encounter for other specified aftercare: Secondary | ICD-10-CM | POA: Diagnosis not present

## 2014-11-26 DIAGNOSIS — Z96612 Presence of left artificial shoulder joint: Secondary | ICD-10-CM

## 2014-11-26 NOTE — Patient Instructions (Addendum)
Rotation: External (Single Arm)   Side toward anchor in shoulder width stance with elbow bent to 90, arm across mid-section. Thumb up, pull arm away from body, keeping elbow bent. Repeat _10-20_ times per set. Repeat with other arm. Do _1_ sets per session. Do _4-5_ sessions per week. Anchor Height: Waist  http://tub.exer.us/115   Copyright  VHI. All rights reserved.  Resisted Horizontal Abduction: Bilateral   Sit or stand, tubing in both hands, arms out in front. Keeping arms straight, pinch shoulder blades together and stretch arms out. Repeat _10-20___ times per set. Do _1-2___ sets per session. Do _1-2___ sessions per day.  http://orth.exer.us/968   Copyright  VHI. All rights reserved.  Wall Shoulder Press-Out   With palms flat on wall, shoulder-width apart, and elbows straight, press shoulders back. Return. Repeat __10__ times or for ____ minutes. Do ___2_ sessions per day.  http://cc.exer.us/56   Copyright  VHI. All rights reserved.   Low Row: Standing   Face anchor, feet shoulder width apart. Palms up, pull arms back, squeezing shoulder blades together. Repeat 20__ times per set. Do __1-2 sets per session. Do _4-5_ sessions per week. Anchor Height: Waist  http://tub.exer.us/65   Copyright  VHI. All rights reserved.
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
## 2014-12-07 ENCOUNTER — Ambulatory Visit: Payer: PRIVATE HEALTH INSURANCE

## 2014-12-08 ENCOUNTER — Ambulatory Visit: Payer: PRIVATE HEALTH INSURANCE

## 2014-12-16 ENCOUNTER — Encounter: Payer: PRIVATE HEALTH INSURANCE | Admitting: Physical Therapy

## 2014-12-17 ENCOUNTER — Ambulatory Visit: Payer: Medicare Other | Attending: Orthopedic Surgery | Admitting: Physical Therapy

## 2014-12-17 DIAGNOSIS — Z96612 Presence of left artificial shoulder joint: Secondary | ICD-10-CM

## 2014-12-17 DIAGNOSIS — M25612 Stiffness of left shoulder, not elsewhere classified: Secondary | ICD-10-CM | POA: Insufficient documentation

## 2014-12-17 DIAGNOSIS — Z5189 Encounter for other specified aftercare: Secondary | ICD-10-CM | POA: Insufficient documentation

## 2014-12-17 DIAGNOSIS — M25512 Pain in left shoulder: Secondary | ICD-10-CM | POA: Diagnosis not present

## 2014-12-17 DIAGNOSIS — R29898 Other symptoms and signs involving the musculoskeletal system: Secondary | ICD-10-CM

## 2014-12-17 NOTE — Addendum Note (Signed)
Addended by: Vivien PrestoSIMPSON, STACY C on: 12/17/2014 10:51 AM   Modules accepted: Orders
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
## 2014-12-21 ENCOUNTER — Ambulatory Visit: Payer: Medicare Other | Admitting: Physical Therapy

## 2014-12-22 ENCOUNTER — Other Ambulatory Visit: Payer: Self-pay | Admitting: *Deleted

## 2014-12-22 MED ORDER — GLUCOSE BLOOD VI STRP: ORAL_STRIP | Status: DC

## 2014-12-23 ENCOUNTER — Other Ambulatory Visit: Payer: Self-pay | Admitting: Family Medicine

## 2014-12-23 DIAGNOSIS — E119 Type 2 diabetes mellitus without complications: Secondary | ICD-10-CM

## 2014-12-23 MED ORDER — GLUCOSE BLOOD VI STRP: 1.0000 | ORAL_STRIP | Freq: Every day | Status: DC

## 2014-12-23 NOTE — Telephone Encounter (Signed)
Glucometer test strips written to comply with [employer] J[employer]

## 2014-12-27 ENCOUNTER — Ambulatory Visit: Payer: Medicare Other | Admitting: Physical Therapy

## 2014-12-27 DIAGNOSIS — R29898 Other symptoms and signs involving the musculoskeletal system: Secondary | ICD-10-CM

## 2014-12-27 DIAGNOSIS — Z5189 Encounter for other specified aftercare: Secondary | ICD-10-CM | POA: Diagnosis not present
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
## 2014-12-29 ENCOUNTER — Ambulatory Visit: Payer: Medicare Other | Admitting: Physical Therapy

## 2014-12-29 ENCOUNTER — Encounter: Payer: 59 | Admitting: Physical Therapy

## 2014-12-29 DIAGNOSIS — Z5189 Encounter for other specified aftercare: Secondary | ICD-10-CM | POA: Diagnosis not present

## 2014-12-29 DIAGNOSIS — R29898 Other symptoms and signs involving the musculoskeletal system: Secondary | ICD-10-CM

## 2014-12-29 DIAGNOSIS — M25612 Stiffness of left shoulder, not elsewhere classified: Secondary | ICD-10-CM

## 2014-12-29 DIAGNOSIS — Z96612 Presence of left artificial shoulder joint: Secondary | ICD-10-CM

## 2014-12-29 DIAGNOSIS — M25512 Pain in left shoulder: Secondary | ICD-10-CM
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
## 2015-01-03 ENCOUNTER — Ambulatory Visit: Payer: Medicare Other | Admitting: Physical Therapy

## 2015-01-05 ENCOUNTER — Ambulatory Visit: Payer: Medicare Other | Admitting: Physical Therapy

## 2015-01-05 DIAGNOSIS — R29898 Other symptoms and signs involving the musculoskeletal system: Secondary | ICD-10-CM

## 2015-01-05 DIAGNOSIS — Z5189 Encounter for other specified aftercare: Secondary | ICD-10-CM | POA: Diagnosis not present

## 2015-01-05 DIAGNOSIS — M25612 Stiffness of left shoulder, not elsewhere classified: Secondary | ICD-10-CM

## 2015-01-05 NOTE — Patient Instructions (Signed)
Handout for group given and reviewed.  Patient said he would think about it. Continue home exercises upon discharge for continued strengthening.
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
## 2015-01-17 LAB — HM DIABETES EYE EXAM

## 2015-02-07 ENCOUNTER — Encounter (HOSPITAL_COMMUNITY): Payer: Self-pay

## 2015-02-07 ENCOUNTER — Emergency Department (HOSPITAL_COMMUNITY): Payer: Medicare Other

## 2015-02-07 ENCOUNTER — Emergency Department (HOSPITAL_COMMUNITY)
Admission: EM | Admit: 2015-02-07 | Discharge: 2015-02-07 | Disposition: A | Discharge status: Home / Self Care | Payer: Medicare Other | Attending: Emergency Medicine | Admitting: Emergency Medicine

## 2015-02-07 DIAGNOSIS — Z79899 Other long term (current) drug therapy: Secondary | ICD-10-CM | POA: Diagnosis not present

## 2015-02-07 DIAGNOSIS — R109 Unspecified abdominal pain: Secondary | ICD-10-CM

## 2015-02-07 DIAGNOSIS — K219 Gastro-esophageal reflux disease without esophagitis: Secondary | ICD-10-CM | POA: Insufficient documentation

## 2015-02-07 DIAGNOSIS — M1990 Unspecified osteoarthritis, unspecified site: Secondary | ICD-10-CM | POA: Insufficient documentation

## 2015-02-07 DIAGNOSIS — K5900 Constipation, unspecified: Secondary | ICD-10-CM | POA: Diagnosis not present

## 2015-02-07 DIAGNOSIS — Z720 Tobacco use: Secondary | ICD-10-CM | POA: Diagnosis not present

## 2015-02-07 DIAGNOSIS — Z87828 Personal history of other (healed) physical injury and trauma: Secondary | ICD-10-CM | POA: Diagnosis not present

## 2015-02-07 DIAGNOSIS — Z8619 Personal history of other infectious and parasitic diseases: Secondary | ICD-10-CM | POA: Insufficient documentation

## 2015-02-07 DIAGNOSIS — E119 Type 2 diabetes mellitus without complications: Secondary | ICD-10-CM | POA: Insufficient documentation

## 2015-02-07 DIAGNOSIS — I10 Essential (primary) hypertension: Secondary | ICD-10-CM | POA: Insufficient documentation

## 2015-02-07 DIAGNOSIS — Z9089 Acquired absence of other organs: Secondary | ICD-10-CM | POA: Insufficient documentation

## 2015-02-07 DIAGNOSIS — R1030 Lower abdominal pain, unspecified: Secondary | ICD-10-CM | POA: Diagnosis present

## 2015-02-07 LAB — URINALYSIS, ROUTINE W REFLEX MICROSCOPIC
Bilirubin Urine: NEGATIVE
Glucose, UA: NEGATIVE mg/dL
HGB URINE DIPSTICK: NEGATIVE
Ketones, ur: NEGATIVE mg/dL
Leukocytes, UA: NEGATIVE
NITRITE: NEGATIVE
PROTEIN: NEGATIVE mg/dL
Specific Gravity, Urine: 1.023 (ref 1.005–1.030)
UROBILINOGEN UA: 1 mg/dL (ref 0.0–1.0)
pH: 7 (ref 5.0–8.0)

## 2015-02-07 LAB — CBC WITH DIFFERENTIAL/PLATELET
BASOS PCT: 0 % (ref 0–1)
Basophils Absolute: 0 10*3/uL (ref 0.0–0.1)
EOS ABS: 0.3 10*3/uL (ref 0.0–0.7)
Eosinophils Relative: 5 % (ref 0–5)
HCT: 44.3 % (ref 39.0–52.0)
HEMOGLOBIN: 15.6 g/dL (ref 13.0–17.0)
Lymphocytes Relative: 36 % (ref 12–46)
Lymphs Abs: 1.9 10*3/uL (ref 0.7–4.0)
MCH: 28.4 pg (ref 26.0–34.0)
MCHC: 35.2 g/dL (ref 30.0–36.0)
MCV: 80.7 fL (ref 78.0–100.0)
MONO ABS: 0.5 10*3/uL (ref 0.1–1.0)
MONOS PCT: 9 % (ref 3–12)
Neutro Abs: 2.7 10*3/uL (ref 1.7–7.7)
Neutrophils Relative %: 50 % (ref 43–77)
Platelets: 292 10*3/uL (ref 150–400)
RBC: 5.49 MIL/uL (ref 4.22–5.81)
RDW: 13.5 % (ref 11.5–15.5)
WBC: 5.4 10*3/uL (ref 4.0–10.5)

## 2015-02-07 LAB — COMPREHENSIVE METABOLIC PANEL
ALT: 35 U/L (ref 0–53)
AST: 36 U/L (ref 0–37)
Albumin: 4.2 g/dL (ref 3.5–5.2)
Alkaline Phosphatase: 62 U/L (ref 39–117)
Anion gap: 10 (ref 5–15)
BILIRUBIN TOTAL: 0.6 mg/dL (ref 0.3–1.2)
BUN: 9 mg/dL (ref 6–23)
CALCIUM: 9.6 mg/dL (ref 8.4–10.5)
CHLORIDE: 98 mmol/L (ref 96–112)
CO2: 28 mmol/L (ref 19–32)
Creatinine, Ser: 0.93 mg/dL (ref 0.50–1.35)
GFR, EST NON AFRICAN AMERICAN: 83 mL/min — AB (ref >90)
GLUCOSE: 171 mg/dL — AB (ref 70–99)
Potassium: 3.7 mmol/L (ref 3.5–5.1)
Sodium: 136 mmol/L (ref 135–145)
Total Protein: 7.5 g/dL (ref 6.0–8.3)

## 2015-02-07 LAB — LIPASE, BLOOD: Lipase: 35 U/L (ref 11–59)

## 2015-02-07 LAB — I-STAT CG4 LACTIC ACID, ED: Lactic Acid, Venous: 2.34 mmol/L (ref 0.5–2.0)

## 2015-02-07 MED ORDER — IOHEXOL 350 MG/ML SOLN
100.0000 mL | Freq: Once | INTRAVENOUS | Status: AC | PRN
  Administered 2015-02-07: 100 mL via INTRAVENOUS

## 2015-02-07 MED ORDER — SODIUM CHLORIDE 0.9 % IV BOLUS (SEPSIS)
1000.0000 mL | Freq: Once | INTRAVENOUS | Status: AC
  Administered 2015-02-07: 1000 mL via INTRAVENOUS

## 2015-02-07 MED ORDER — FAMOTIDINE 20 MG PO TABS
20.0000 mg | ORAL_TABLET | Freq: Once | ORAL | Status: AC
  Administered 2015-02-07: 20 mg via ORAL
  Filled 2015-02-07: qty 1

## 2015-02-07 MED ORDER — POLYETHYLENE GLYCOL 3350 17 G PO PACK: 17.0000 g | PACK | Freq: Every day | ORAL | Status: DC

## 2015-02-07 MED ORDER — DICYCLOMINE HCL 10 MG PO CAPS
10.0000 mg | ORAL_CAPSULE | Freq: Once | ORAL | Status: AC
  Administered 2015-02-07: 10 mg via ORAL
  Filled 2015-02-07: qty 1

## 2015-02-07 NOTE — ED Notes (Signed)
Patient transported to CT 

## 2015-02-07 NOTE — ED Notes (Signed)
Lactic acid results to Dr. Jodi MourningZavitz

## 2015-02-07 NOTE — Discharge Instructions (Signed)
If you were given medicines take as directed.  If you are on coumadin or contraceptives realize their levels and effectiveness is altered by many different medicines.  If you have any reaction (rash, tongues swelling, other) to the medicines stop taking and see a physician.   Please follow up as directed and return to the ER or see a physician for new or worsening symptoms.  Thank you. Filed Vitals:   02/07/15 0800 02/07/15 0830 02/07/15 0915 02/07/15 1045  BP: 135/95 142/80 141/72 127/69  Pulse: 99 92 82 86  Temp:      TempSrc:      Resp:    18  Height:      Weight:      SpO2: 98% 97% 98% 100%
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
## 2015-02-07 NOTE — ED Notes (Signed)
Pt. Is from home.Complaint of R sided pain. States it feels like bloating. Hx of IBS, GERD, enlarged prostate. States he was seen by PCP Wednesday and was given a "cocktail" for GERD but his insurance wouldn't cover it. Pt. States he has some burning with urination.  AxO x4.

## 2015-02-15 ENCOUNTER — Other Ambulatory Visit: Payer: Self-pay | Admitting: Family Medicine

## 2015-02-16 ENCOUNTER — Other Ambulatory Visit: Payer: Self-pay | Admitting: *Deleted

## 2015-02-17 MED ORDER — AMLODIPINE BESYLATE 10 MG PO TABS: 10.0000 mg | ORAL_TABLET | Freq: Every day | ORAL | Status: DC

## 2015-03-11 ENCOUNTER — Other Ambulatory Visit: Payer: Self-pay | Admitting: *Deleted

## 2015-03-14 ENCOUNTER — Other Ambulatory Visit: Payer: Self-pay | Admitting: *Deleted

## 2015-03-14 MED ORDER — METFORMIN HCL 1000 MG PO TABS: 1000.0000 mg | ORAL_TABLET | Freq: Two times a day (BID) | ORAL | Status: DC

## 2015-03-17 ENCOUNTER — Other Ambulatory Visit: Payer: Self-pay | Admitting: Orthopedic Surgery

## 2015-03-17 ENCOUNTER — Ambulatory Visit: Payer: Medicare Other | Attending: Orthopedic Surgery

## 2015-03-17 DIAGNOSIS — M25512 Pain in left shoulder: Secondary | ICD-10-CM | POA: Insufficient documentation

## 2015-03-17 DIAGNOSIS — M545 Low back pain, unspecified: Secondary | ICD-10-CM

## 2015-03-17 DIAGNOSIS — M25612 Stiffness of left shoulder, not elsewhere classified: Secondary | ICD-10-CM | POA: Insufficient documentation

## 2015-03-17 DIAGNOSIS — Z5189 Encounter for other specified aftercare: Secondary | ICD-10-CM | POA: Insufficient documentation
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
## 2015-04-04 ENCOUNTER — Ambulatory Visit
Admission: RE | Admit: 2015-04-04 | Discharge: 2015-04-04 | Disposition: A | Discharge status: Home / Self Care | Payer: Medicare Other | Source: Ambulatory Visit | Attending: Orthopedic Surgery | Admitting: Orthopedic Surgery

## 2015-04-04 DIAGNOSIS — M545 Low back pain: Secondary | ICD-10-CM

## 2015-04-13 ENCOUNTER — Other Ambulatory Visit: Payer: Self-pay | Admitting: Orthopedic Surgery

## 2015-04-15 ENCOUNTER — Telehealth: Payer: Self-pay | Admitting: Vascular Surgery

## 2015-04-15 NOTE — Telephone Encounter (Signed)
-----   Message from Phillips Odorarol S Pullins, RN sent at 04/15/2015 10:51 AM EDT ----- Regarding: needs consult with TFE Please schedule this pt. for a new patient consult with Dr. Arbie CookeyEarly prior to scheduled ALIF on 05/25/15; (if possible, the doctors would like to see these pt's about 2 wks. out from the surgery date) Dr. Arbie CookeyEarly will be assisting Dr. Yevette Edwardsumonski.  Please remind the pt. to bring a copy of the L-S spine films to the appt.  Thanks.

## 2015-04-15 NOTE — Telephone Encounter (Signed)
Spoke with patient to schedule an appointment for consultation. 05/10/15 @ 830am with TFE, dpm

## 2015-04-18 ENCOUNTER — Telehealth: Payer: Self-pay | Admitting: *Deleted

## 2015-04-18 NOTE — Telephone Encounter (Signed)
[facility]  Form placed in provider box for review.  Clovis PuMartin, Tamika L, RN

## 2015-04-20 NOTE — Telephone Encounter (Addendum)
LMOVM for pt to return call. Per Dr. Caleb PoppNettey pt will need appt to complete form. Kyan Giannone, Maryjo RochesterJessica Dawn, CMA

## 2015-04-29 ENCOUNTER — Encounter (HOSPITAL_COMMUNITY): Payer: Self-pay | Admitting: Physical Medicine and Rehabilitation

## 2015-04-29 ENCOUNTER — Emergency Department (HOSPITAL_COMMUNITY): Payer: Medicare Other

## 2015-04-29 ENCOUNTER — Emergency Department (HOSPITAL_COMMUNITY)
Admission: EM | Admit: 2015-04-29 | Discharge: 2015-04-29 | Disposition: A | Discharge status: Home / Self Care | Payer: Medicare Other | Attending: Emergency Medicine | Admitting: Emergency Medicine

## 2015-04-29 DIAGNOSIS — Z8619 Personal history of other infectious and parasitic diseases: Secondary | ICD-10-CM | POA: Diagnosis not present

## 2015-04-29 DIAGNOSIS — G8929 Other chronic pain: Secondary | ICD-10-CM | POA: Insufficient documentation

## 2015-04-29 DIAGNOSIS — Z9049 Acquired absence of other specified parts of digestive tract: Secondary | ICD-10-CM | POA: Diagnosis not present

## 2015-04-29 DIAGNOSIS — R109 Unspecified abdominal pain: Secondary | ICD-10-CM | POA: Diagnosis present

## 2015-04-29 DIAGNOSIS — R1011 Right upper quadrant pain: Secondary | ICD-10-CM | POA: Insufficient documentation

## 2015-04-29 DIAGNOSIS — I10 Essential (primary) hypertension: Secondary | ICD-10-CM | POA: Diagnosis not present

## 2015-04-29 DIAGNOSIS — Z720 Tobacco use: Secondary | ICD-10-CM | POA: Diagnosis not present

## 2015-04-29 DIAGNOSIS — K219 Gastro-esophageal reflux disease without esophagitis: Secondary | ICD-10-CM | POA: Diagnosis not present

## 2015-04-29 DIAGNOSIS — Z87828 Personal history of other (healed) physical injury and trauma: Secondary | ICD-10-CM | POA: Diagnosis not present

## 2015-04-29 DIAGNOSIS — M1990 Unspecified osteoarthritis, unspecified site: Secondary | ICD-10-CM | POA: Diagnosis not present

## 2015-04-29 DIAGNOSIS — E119 Type 2 diabetes mellitus without complications: Secondary | ICD-10-CM | POA: Insufficient documentation

## 2015-04-29 DIAGNOSIS — Z79899 Other long term (current) drug therapy: Secondary | ICD-10-CM | POA: Diagnosis not present

## 2015-04-29 LAB — CBC WITH DIFFERENTIAL/PLATELET
BASOS PCT: 1 % (ref 0–1)
Basophils Absolute: 0 10*3/uL (ref 0.0–0.1)
EOS ABS: 0.2 10*3/uL (ref 0.0–0.7)
Eosinophils Relative: 5 % (ref 0–5)
HEMATOCRIT: 42.4 % (ref 39.0–52.0)
Hemoglobin: 14.6 g/dL (ref 13.0–17.0)
LYMPHS ABS: 1.8 10*3/uL (ref 0.7–4.0)
Lymphocytes Relative: 39 % (ref 12–46)
MCH: 27.8 pg (ref 26.0–34.0)
MCHC: 34.4 g/dL (ref 30.0–36.0)
MCV: 80.6 fL (ref 78.0–100.0)
MONOS PCT: 8 % (ref 3–12)
Monocytes Absolute: 0.4 10*3/uL (ref 0.1–1.0)
NEUTROS ABS: 2.2 10*3/uL (ref 1.7–7.7)
NEUTROS PCT: 47 % (ref 43–77)
PLATELETS: 268 10*3/uL (ref 150–400)
RBC: 5.26 MIL/uL (ref 4.22–5.81)
RDW: 13.3 % (ref 11.5–15.5)
WBC: 4.6 10*3/uL (ref 4.0–10.5)

## 2015-04-29 LAB — COMPREHENSIVE METABOLIC PANEL
ALT: 15 U/L — ABNORMAL LOW (ref 17–63)
AST: 18 U/L (ref 15–41)
Albumin: 3.8 g/dL (ref 3.5–5.0)
Alkaline Phosphatase: 57 U/L (ref 38–126)
Anion gap: 9 (ref 5–15)
BUN: 7 mg/dL (ref 6–20)
CALCIUM: 9.5 mg/dL (ref 8.9–10.3)
CO2: 24 mmol/L (ref 22–32)
CREATININE: 0.9 mg/dL (ref 0.61–1.24)
Chloride: 103 mmol/L (ref 101–111)
GFR calc Af Amer: >60 mL/min (ref >60)
GLUCOSE: 173 mg/dL — AB (ref 65–99)
Potassium: 4.4 mmol/L (ref 3.5–5.1)
Sodium: 136 mmol/L (ref 135–145)
Total Bilirubin: 0.4 mg/dL (ref 0.3–1.2)
Total Protein: 6.8 g/dL (ref 6.5–8.1)

## 2015-04-29 LAB — URINALYSIS, ROUTINE W REFLEX MICROSCOPIC
Bilirubin Urine: NEGATIVE
GLUCOSE, UA: 100 mg/dL — AB
HGB URINE DIPSTICK: NEGATIVE
KETONES UR: NEGATIVE mg/dL
LEUKOCYTES UA: NEGATIVE
NITRITE: NEGATIVE
PH: 5.5 (ref 5.0–8.0)
Protein, ur: NEGATIVE mg/dL
Specific Gravity, Urine: 1.02 (ref 1.005–1.030)
Urobilinogen, UA: 0.2 mg/dL (ref 0.0–1.0)

## 2015-04-29 LAB — LIPASE, BLOOD: LIPASE: 28 U/L (ref 22–51)

## 2015-04-29 MED ORDER — DICYCLOMINE HCL 20 MG PO TABS: 20.0000 mg | ORAL_TABLET | Freq: Two times a day (BID) | ORAL | Status: DC | PRN

## 2015-04-29 MED ORDER — IOHEXOL 300 MG/ML  SOLN
100.0000 mL | Freq: Once | INTRAMUSCULAR | Status: AC | PRN
  Administered 2015-04-29: 100 mL via INTRAVENOUS

## 2015-04-29 MED ORDER — ONDANSETRON HCL 4 MG PO TABS: 4.0000 mg | ORAL_TABLET | Freq: Three times a day (TID) | ORAL | Status: DC | PRN

## 2015-04-29 MED ORDER — IOHEXOL 300 MG/ML  SOLN
25.0000 mL | Freq: Once | INTRAMUSCULAR | Status: AC | PRN
  Administered 2015-04-29: 25 mL via ORAL

## 2015-04-29 NOTE — ED Notes (Addendum)
Pt upset and wishes to know what the delay is, this RN informed pt that urinalysis and Ct results just came back and PA would be notified to come speak with pt. Pt verbalized understanding.

## 2015-04-29 NOTE — ED Notes (Signed)
Pt has returned from being out of the department for testing; pt placed back on continuous pulse oximetry and blood pressure cuff

## 2015-04-29 NOTE — Discharge Instructions (Signed)
Read the information below.  Use the prescribed medication as directed.  Please discuss all new medications with your pharmacist.  You may return to the Emergency Department at any time for worsening condition or any new symptoms that concern you.    If you develop high fevers, worsening abdominal pain, uncontrolled vomiting, or are unable to tolerate fluids by mouth, return to the ER for a recheck.        Metformin and X-ray Contrast Studies For some X-ray exams, a contrast dye is used. Contrast dye is a type of medicine used to make the X-ray image clearer. The contrast dye is given to the patient through a vein (intravenously). If you need to have this type of X-ray exam and you take a medication called metformin, your caregiver may have you stop taking metformin before the exam.  LACTIC ACIDOSIS In rare cases, a serious medical condition called lactic acidosis can develop in people who take metformin and receive contrast dye. The following conditions can increase the risk of this complication:   Kidney failure.  Liver problems.  Certain types of heart problems such as:  Heart failure.  Heart attack.  Heart infection.  Heart valve problems.  Alcohol abuse. If left untreated, lactic acidosis can lead to coma.  SYMPTOMS OF LACTIC ACIDOSIS Symptoms of lactic acidosis can include:  Rapid breathing (hyperventilation).  Neurologic symptoms such as:  Headaches.  Confusion.  Dizziness.  Excessive sweating.  Feeling sick to your stomach (nauseous) or throwing up (vomiting). AFTER THE X-RAY EXAM  Stay well-hydrated. Drink fluids as instructed by your caregiver.  If you have a risk of developing lactic acidosis, blood tests may be done to make sure your kidney function is okay.  Metformin is usually stopped for 48 hours after the X-ray exam. Ask your caregiver when you can start taking metformin again. SEEK MEDICAL CARE IF:   You have shortness of breath or difficulty  breathing.  You develop a headache that does not go away.  You have nausea or vomiting.  You urinate more than normal.  You develop a skin rash and have:  Redness.  Swelling.  Itching. Document Released: 11/14/2009 Document Revised: 02/18/2012 Document Reviewed: 11/14/2009 [facility] Patient Information 2015 [employer] [place]. This information is not intended to replace advice given to you by your health care provider. Make sure you discuss any questions you have with your health care provider.   Abdominal Pain Many things can cause abdominal pain. Usually, abdominal pain is not caused by a disease and will improve without treatment. It can often be observed and treated at home. Your health care provider will do a physical exam and possibly order blood tests and X-rays to help determine the seriousness of your pain. However, in many cases, more time must pass before a clear cause of the pain can be found. Before that point, your health care provider may not know if you need more testing or further treatment. HOME CARE INSTRUCTIONS  Monitor your abdominal pain for any changes. The following actions may help to alleviate any discomfort you are experiencing:  Only take over-the-counter or prescription medicines as directed by your health care provider.  Do not take laxatives unless directed to do so by your health care provider.  Try a clear liquid diet (broth, tea, or water) as directed by your health care provider. Slowly move to a bland diet as tolerated. SEEK MEDICAL CARE IF:  You have unexplained abdominal pain.  You have abdominal pain associated with nausea  or diarrhea.  You have pain when you urinate or have a bowel movement.  You experience abdominal pain that wakes you in the night.  You have abdominal pain that is worsened or improved by eating food.  You have abdominal pain that is worsened with eating fatty foods.  You have a fever. SEEK IMMEDIATE MEDICAL CARE IF:     Your pain does not go away within 2 hours.  You keep throwing up (vomiting).  Your pain is felt only in portions of the abdomen, such as the right side or the left lower portion of the abdomen.  You pass bloody or black tarry stools. MAKE SURE YOU:  Understand these instructions.   Will watch your condition.   Will get help right away if you are not doing well or get worse.  Document Released: 09/05/2005 Document Revised: 12/01/2013 Document Reviewed: 08/05/2013 [facility] Patient Information 2015 [employer], [place]. This information is not intended to replace advice given to you by your health care provider. Make sure you discuss any questions you have with your health care provider.

## 2015-04-29 NOTE — ED Notes (Signed)
Pt presents to department for evaluation of R sided flank pain and dysuria. Ongoing x1 week. 8/10 pain upon arrival to ED. Pt is alert and oriented x4.

## 2015-04-29 NOTE — ED Notes (Signed)
pt undressed, in gown, on continuous pulse oximetry and blood pressure cuff

## 2015-04-29 NOTE — ED Notes (Signed)
Pt reports R sided flank pain intermittently x1 year, also states dysuria. Reports pain increased this week. 8/10 pain upon arrival to ED. R flank area tender to palpation. Denies hematuria. Pt is alert and oriented x4.

## 2015-04-29 NOTE — ED Notes (Signed)
Patient transported to CT 
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
## 2015-05-01 LAB — URINE CULTURE

## 2015-05-04 ENCOUNTER — Other Ambulatory Visit: Payer: Self-pay

## 2015-05-06 ENCOUNTER — Encounter: Payer: Self-pay | Admitting: Vascular Surgery

## 2015-05-10 ENCOUNTER — Encounter: Payer: Self-pay | Admitting: Vascular Surgery

## 2015-05-10 ENCOUNTER — Ambulatory Visit (INDEPENDENT_AMBULATORY_CARE_PROVIDER_SITE_OTHER): Payer: Medicare Other | Admitting: Vascular Surgery

## 2015-05-10 VITALS — BP 156/91 | HR 98 | Ht 73.0 in | Wt 251.0 lb

## 2015-05-10 DIAGNOSIS — M5137 Other intervertebral disc degeneration, lumbosacral region: Secondary | ICD-10-CM
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
## 2015-05-11 ENCOUNTER — Telehealth: Payer: Self-pay | Admitting: Family Medicine

## 2015-05-11 NOTE — Telephone Encounter (Signed)
Mr. Brian Sievedamson is calling because he is concerned about needing to put his surgery off, as he is in pain. His surgery is originally scheduled for June the 15th. However, he was unable to obtain an appointment with Dr. Caleb PoppNettey for surgical clearance until June 16th 2016. He would like to know if he can be worked in sooner. There is a possibility of double-booking Mr. Brian Dominguez on June 6th 2016 for surgical clearance if Dr. Caleb PoppNettey agrees to this. I did not tell the patient that this was an option, I explained to him that unfortunately the 16th was all that we had, but I agreed to send his concerns to the doctor to see if there was anything else that we could do for him. Please advise, thank you, Dorothey BasemanSadie Reynolds, ASA

## 2015-05-12 NOTE — Telephone Encounter (Signed)
Spoke with Preceptor Dr. Leveda AnnaHensel. Discussed that it is appropriate for patient if he would like to make a same day appointment for worsening back pain (however, will not get medical clearance at that time). It appears his surgery is scheduled for 06/15/2015, so it should be fine for him to see me on the 16th.

## 2015-05-13 NOTE — Telephone Encounter (Signed)
LMOVM for pt to return call .Fleeger, Jessica Dawn  

## 2015-05-17 ENCOUNTER — Inpatient Hospital Stay (HOSPITAL_COMMUNITY): Admission: RE | Admit: 2015-05-17 | Payer: Medicare Other | Source: Ambulatory Visit

## 2015-05-18 ENCOUNTER — Other Ambulatory Visit: Payer: Self-pay | Admitting: *Deleted

## 2015-05-19 NOTE — Telephone Encounter (Signed)
2nd request.  Selmer Adduci L, RN  

## 2015-05-26 ENCOUNTER — Encounter: Payer: Self-pay | Admitting: Family Medicine

## 2015-05-26 ENCOUNTER — Other Ambulatory Visit: Payer: Self-pay | Admitting: *Deleted

## 2015-05-26 ENCOUNTER — Ambulatory Visit (INDEPENDENT_AMBULATORY_CARE_PROVIDER_SITE_OTHER): Payer: Medicare Other | Admitting: Family Medicine

## 2015-05-26 VITALS — BP 170/88 | HR 104 | Temp 97.7°F | Ht 73.0 in | Wt 240.8 lb

## 2015-05-26 DIAGNOSIS — I10 Essential (primary) hypertension: Secondary | ICD-10-CM | POA: Diagnosis not present

## 2015-05-26 DIAGNOSIS — E119 Type 2 diabetes mellitus without complications: Secondary | ICD-10-CM | POA: Diagnosis not present

## 2015-05-26 DIAGNOSIS — K219 Gastro-esophageal reflux disease without esophagitis: Secondary | ICD-10-CM | POA: Diagnosis not present

## 2015-05-26 DIAGNOSIS — M545 Low back pain: Secondary | ICD-10-CM

## 2015-05-26 DIAGNOSIS — J309 Allergic rhinitis, unspecified: Secondary | ICD-10-CM

## 2015-05-26 DIAGNOSIS — R946 Abnormal results of thyroid function studies: Secondary | ICD-10-CM

## 2015-05-26 DIAGNOSIS — R7989 Other specified abnormal findings of blood chemistry: Secondary | ICD-10-CM

## 2015-05-26 LAB — POCT GLYCOSYLATED HEMOGLOBIN (HGB A1C): HEMOGLOBIN A1C: 7.7

## 2015-05-26 MED ORDER — LORATADINE 10 MG PO TABS: 10.0000 mg | ORAL_TABLET | Freq: Every day | ORAL | Status: DC

## 2015-05-26 MED ORDER — ESOMEPRAZOLE MAGNESIUM 20 MG PO CPDR: 20.0000 mg | DELAYED_RELEASE_CAPSULE | Freq: Every day | ORAL | Status: DC

## 2015-05-26 MED ORDER — FLUTICASONE PROPIONATE 50 MCG/ACT NA SUSP: 2.0000 | Freq: Every day | NASAL | Status: DC | PRN

## 2015-05-26 NOTE — Progress Notes (Signed)
    Subjective    Brian Dominguez is a 69 y.o. male that presents for a follow-up visit for chronic issues.   1. Back pain Patient is scheduled for lumbar fusion for chronic back pain. He is a current smoker, smoking half a pack every day. He reports no shortness of breath with ambulation. He is able to tolerate exercise until his back hurts. He has seen a cardiologist in the past and had a stress test performed. He does not remember why he was referred but states he has not had any heart problems.   2. Diabetes mellitus, type 2: Currently adherent with metformin. No adverse affects from medication treatment.   3. Hypertension: Patient states blood pressures are "normal" at home. No chest pain, shortness of breath or leg swelling.  4. Allergies: Chronic. Has used Zyrtec in the passed which has not helped with symptoms. He has also used Flonase and Claritin which have worked better. He reports itchy watery eyes, sneezing, rhinorrhea and non-productive cough with the feeling of post-nasal drip especially when lying down. No fevers. No chest pain or shortness of breath.  History  Substance Use Topics  . Smoking status: Current Every Day Smoker -- 0.50 packs/day for 35 years    Types: Cigarettes  . Smokeless tobacco: Never Used     Comment: trying to quitt quit for a while 10 yrs smoking now  . Alcohol Use: No    Allergies  Allergen Reactions  . Enalapril Swelling    Angioedema of the lips with enalapril    No orders of the defined types were placed in this encounter.    ROS  Per HPI   Objective   BP 170/88 mmHg  Pulse 104  Temp(Src) 97.7 F (36.5 C) (Oral)  Ht 6\' 1"  (1.854 m)  Wt 240 lb 12.8 oz (109.226 kg)  BMI 31.78 kg/m2  General: Well appearing, no distress HEENT: TMs normal bilaterally, eyes anicteric with no injection, swollen nasal turbinates bilaterally, oropharynx clear and moist with cobblestoning.  Assessment and Plan   Please refer to problem based charting of  assessment and plan

## 2015-05-26 NOTE — Patient Instructions (Signed)
Thank you for coming to see me today. It was a pleasure. Today we talked about:   Medical clearance: You are considered low risk and you have no modifiable risk factors for your surgery.  Allergies: I am prescribing you Claritin. Please also use the flonase every day.  Reflux: I am prescribing you Nexium and providing you a list of foods to avoid. The Nexium is short term until we can figure out what is causing your symptoms.  Hypertension: Your blood pressure was high today. I would like you to record your blood pressures at home  Diabetes: your A1C was higher than your previous at 7.7. I am fine with that but would like for it to not keep increasing. We will monitor this at your next visit.  Elevated TSH: I will follow-up with this with a blood test today.  Please make an appointment to see me in 3 months for follow-up.  If you have any questions or concerns, please do not hesitate to call the office at [phone].  Sincerely,  Jacquelin Hawking, MD

## 2015-05-27 ENCOUNTER — Encounter: Payer: Self-pay | Admitting: Family Medicine

## 2015-05-27 DIAGNOSIS — M549 Dorsalgia, unspecified: Secondary | ICD-10-CM | POA: Insufficient documentation

## 2015-05-27 LAB — T4, FREE: FREE T4: 1.22 ng/dL (ref 0.80–1.80)

## 2015-05-27 LAB — TSH: TSH: 2.307 u[IU]/mL (ref 0.350–4.500)

## 2015-05-27 NOTE — Assessment & Plan Note (Signed)
   Discontinue Zyrtec  Start Claritin 10mg  daily  Refill Flonase 2 sprays each nostril qD

## 2015-05-27 NOTE — Assessment & Plan Note (Signed)
Patient here for medical clearance of upcoming lumbar fusion surgery. He is low risk for surgery with no modifiable risk factors. Letter written and given to patient

## 2015-05-27 NOTE — Assessment & Plan Note (Signed)
Hemoglobin A1C of 7.7 today. Patient unsure of why it has gone up other than not adhering to a diabetic friendly diet. He does not check blood sugars at home and does not with to start. Although A1C is over 7, with patient's age, and stable disease process, will be okay with an A1C of <8. However, may need to start second drug if A1C does not improve.

## 2015-05-27 NOTE — Assessment & Plan Note (Signed)
Refill Nexium.

## 2015-05-27 NOTE — Assessment & Plan Note (Signed)
Blood pressure uncontrolled today. Patient has a trend of uncontrolled numbers, however, attributes numbers to white coat hypertension as he states blood pressures are less than 140/90 at home.  Check blood pressures daily at home and record log  May need to add a third agent if home readings confirm uncontrolled blood pressures

## 2015-05-30 MED ORDER — HYDROCHLOROTHIAZIDE 25 MG PO TABS: 25.0000 mg | ORAL_TABLET | Freq: Every day | ORAL | Status: DC

## 2015-06-06 ENCOUNTER — Encounter (HOSPITAL_COMMUNITY)
Admission: RE | Admit: 2015-06-06 | Discharge: 2015-06-06 | Disposition: A | Discharge status: Home / Self Care | Payer: Medicare Other | Source: Ambulatory Visit | Attending: Orthopedic Surgery | Admitting: Orthopedic Surgery

## 2015-06-06 ENCOUNTER — Encounter (HOSPITAL_COMMUNITY): Payer: Self-pay

## 2015-06-06 DIAGNOSIS — Z0183 Encounter for blood typing: Secondary | ICD-10-CM | POA: Diagnosis not present

## 2015-06-06 DIAGNOSIS — Z01812 Encounter for preprocedural laboratory examination: Secondary | ICD-10-CM | POA: Diagnosis present

## 2015-06-06 LAB — SURGICAL PCR SCREEN
MRSA, PCR: NEGATIVE
Staphylococcus aureus: NEGATIVE

## 2015-06-06 LAB — CBC WITH DIFFERENTIAL/PLATELET
BASOS ABS: 0 10*3/uL (ref 0.0–0.1)
Basophils Relative: 0 % (ref 0–1)
Eosinophils Absolute: 0.3 10*3/uL (ref 0.0–0.7)
Eosinophils Relative: 5 % (ref 0–5)
HEMATOCRIT: 42.1 % (ref 39.0–52.0)
Hemoglobin: 14.7 g/dL (ref 13.0–17.0)
LYMPHS PCT: 39 % (ref 12–46)
Lymphs Abs: 2.1 10*3/uL (ref 0.7–4.0)
MCH: 27.7 pg (ref 26.0–34.0)
MCHC: 34.9 g/dL (ref 30.0–36.0)
MCV: 79.4 fL (ref 78.0–100.0)
Monocytes Absolute: 0.3 10*3/uL (ref 0.1–1.0)
Monocytes Relative: 6 % (ref 3–12)
NEUTROS ABS: 2.7 10*3/uL (ref 1.7–7.7)
Neutrophils Relative %: 50 % (ref 43–77)
Platelets: 293 10*3/uL (ref 150–400)
RBC: 5.3 MIL/uL (ref 4.22–5.81)
RDW: 13.4 % (ref 11.5–15.5)
WBC: 5.5 10*3/uL (ref 4.0–10.5)

## 2015-06-06 LAB — TYPE AND SCREEN
ABO/RH(D): B POS
ANTIBODY SCREEN: NEGATIVE

## 2015-06-06 LAB — COMPREHENSIVE METABOLIC PANEL
ALT: 37 U/L (ref 17–63)
AST: 55 U/L — AB (ref 15–41)
Albumin: 4.3 g/dL (ref 3.5–5.0)
Alkaline Phosphatase: 65 U/L (ref 38–126)
Anion gap: 9 (ref 5–15)
BILIRUBIN TOTAL: 0.7 mg/dL (ref 0.3–1.2)
BUN: 12 mg/dL (ref 6–20)
CO2: 30 mmol/L (ref 22–32)
Calcium: 9.4 mg/dL (ref 8.9–10.3)
Chloride: 98 mmol/L — ABNORMAL LOW (ref 101–111)
Creatinine, Ser: 0.97 mg/dL (ref 0.61–1.24)
GFR calc Af Amer: >60 mL/min (ref >60)
GFR calc non Af Amer: >60 mL/min (ref >60)
GLUCOSE: 158 mg/dL — AB (ref 65–99)
Potassium: 3.4 mmol/L — ABNORMAL LOW (ref 3.5–5.1)
Sodium: 137 mmol/L (ref 135–145)
Total Protein: 7.7 g/dL (ref 6.5–8.1)

## 2015-06-06 LAB — PROTIME-INR
INR: 1.02 (ref 0.00–1.49)
PROTHROMBIN TIME: 13.6 s (ref 11.6–15.2)

## 2015-06-06 LAB — URINALYSIS, ROUTINE W REFLEX MICROSCOPIC
Glucose, UA: NEGATIVE mg/dL
HGB URINE DIPSTICK: NEGATIVE
KETONES UR: NEGATIVE mg/dL
Leukocytes, UA: NEGATIVE
NITRITE: NEGATIVE
PH: 5 (ref 5.0–8.0)
Protein, ur: NEGATIVE mg/dL
SPECIFIC GRAVITY, URINE: 1.038 — AB (ref 1.005–1.030)
Urobilinogen, UA: 1 mg/dL (ref 0.0–1.0)

## 2015-06-06 LAB — GLUCOSE, CAPILLARY: Glucose-Capillary: 160 mg/dL — ABNORMAL HIGH (ref 65–99)

## 2015-06-06 LAB — APTT: aPTT: 27 seconds (ref 24–37)

## 2015-06-06 NOTE — Progress Notes (Signed)
STOP-Bang score 4; [facility]

## 2015-06-06 NOTE — Pre-Procedure Instructions (Signed)
Brian JakesJohn A Dominguez  06/06/2015        Your procedure is scheduled on Wednesday, July 6.  [facility] Ad[facility] at [employer] A.M.  Call this number if you have problems the morning of surgery:  [phone]              For questions prior to surgery ,call [phone], from 8 am to 4 pm.   Remember:  Do not eat food or drink liquids after midnight.Tuesday night  Take these medicines the morning of surgery with A SIP OF WATER Amlodipine, Doxazosin (Cardura),Nexium   Do not wear jewelry.  Do not wear lotions, powders, or perfumes.  Do not wear deodorant.  Do not shave 48 hours prior to surgery.  Men may shave face and neck.  Do not bring valuables to the hospital.  [facility] is not responsible for any belongings or valuables.  Contacts, dentures or bridgework may not be worn into surgery.  Leave your suitcase in the car.  After surgery it may be brought to your room.  For patients admitted to the hospital, discharge time will be determined by your treatment team.       Special instructions:  [place] - Preparing for Surgery  Before surgery, you can play an important role.  Because skin is not sterile, your skin needs to be as free of germs as possible.  You can reduce the number of germs on you skin by washing with CHG (chlorahexidine gluconate) soap before surgery.  CHG is an antiseptic cleaner which kills germs and bonds with the skin to continue killing germs even after washing.  Please DO NOT use if you have an allergy to CHG or antibacterial soaps.  If your skin becomes reddened/irritated stop using the CHG and inform your nurse when you arrive at Short Stay.  Do not shave (including legs and underarms) for at least 48 hours prior to the first CHG shower.  You may shave your face.  Please follow these instructions carefully:   1.  Shower with CHG Soap the night before surgery and the   morning of Surgery.  2.  If you choose to wash your hair, wash your  hair first as usual with your   normal shampoo.  3.  After you shampoo, rinse your hair and body thoroughly to remove the  Shampoo.  4.  Use CHG as you would any other liquid soap.  You can apply chg directly  to the skin and wash gently with scrungie or a clean washcloth.  5.  Apply the CHG Soap to your body ONLY FROM THE NECK DOWN.    Do not use on open wounds or open sores.  Avoid contact with your eyes,   ears, mouth and genitals (private parts).  Wash genitals (private parts)   with your normal soap.  6.  Wash thoroughly, paying special attention to the area where your surgery   will be performed.  7.  Thoroughly rinse your body with warm water from the neck down.  8.  DO NOT shower/wash with your normal soap after using and rinsing off   the CHG Soap.  9.  Pat yourself dry with a clean towel.            10.  Wear clean pajamas.            11.  Place clean sheets on your bed the night of your first shower and do not  sleep with pets.  Day of Surgery  Do not apply any lotions/deoderants the morning of surgery.  Please wear clean clothes to the hospital/surgery center.    Please read over the following fact sheets that you were given. Pain Booklet, Coughing and Deep Breathing, Blood Transfusion Information and Surgical Site Infection Prevention

## 2015-06-06 NOTE — Progress Notes (Signed)
   06/06/15 1052  OBSTRUCTIVE SLEEP APNEA  Have you ever been diagnosed with sleep apnea through a sleep study? No  Do you snore loudly (loud enough to be heard through closed doors)?  0  Do you often feel tired, fatigued, or sleepy during the daytime? 0  Has anyone observed you stop breathing during your sleep? 0  Do you have, or are you being treated for high blood pressure? 1  BMI more than 35 kg/m2? 0  Age over 70 years old? 1  Neck circumference greater than 40 cm/16 inches? 1 (17)  Gender: 1

## 2015-06-15 ENCOUNTER — Inpatient Hospital Stay (HOSPITAL_COMMUNITY): Payer: Medicare Other

## 2015-06-15 ENCOUNTER — Inpatient Hospital Stay (HOSPITAL_COMMUNITY): Payer: Medicare Other | Admitting: Vascular Surgery

## 2015-06-15 ENCOUNTER — Encounter (HOSPITAL_COMMUNITY)
Admission: RE | Disposition: A | Discharge status: Home Health | Payer: Medicare Other | Source: Ambulatory Visit | Attending: Orthopedic Surgery

## 2015-06-15 ENCOUNTER — Inpatient Hospital Stay (HOSPITAL_COMMUNITY)
Admission: RE | Admit: 2015-06-15 | Discharge: 2015-06-16 | DRG: 460 | Disposition: A | Discharge status: Home Health | Payer: Medicare Other | Source: Ambulatory Visit | Attending: Orthopedic Surgery | Admitting: Orthopedic Surgery

## 2015-06-15 ENCOUNTER — Encounter (HOSPITAL_COMMUNITY): Payer: Self-pay | Admitting: Anesthesiology

## 2015-06-15 DIAGNOSIS — M79604 Pain in right leg: Secondary | ICD-10-CM | POA: Diagnosis present

## 2015-06-15 DIAGNOSIS — Z96612 Presence of left artificial shoulder joint: Secondary | ICD-10-CM | POA: Diagnosis present

## 2015-06-15 DIAGNOSIS — I10 Essential (primary) hypertension: Secondary | ICD-10-CM | POA: Diagnosis present

## 2015-06-15 DIAGNOSIS — Z888 Allergy status to other drugs, medicaments and biological substances status: Secondary | ICD-10-CM | POA: Diagnosis not present

## 2015-06-15 DIAGNOSIS — Z419 Encounter for procedure for purposes other than remedying health state, unspecified: Secondary | ICD-10-CM

## 2015-06-15 DIAGNOSIS — Z981 Arthrodesis status: Secondary | ICD-10-CM

## 2015-06-15 DIAGNOSIS — M5410 Radiculopathy, site unspecified: Secondary | ICD-10-CM | POA: Diagnosis present

## 2015-06-15 DIAGNOSIS — E119 Type 2 diabetes mellitus without complications: Secondary | ICD-10-CM | POA: Diagnosis present

## 2015-06-15 DIAGNOSIS — N411 Chronic prostatitis: Secondary | ICD-10-CM | POA: Diagnosis present

## 2015-06-15 DIAGNOSIS — F17210 Nicotine dependence, cigarettes, uncomplicated: Secondary | ICD-10-CM | POA: Diagnosis present

## 2015-06-15 DIAGNOSIS — K219 Gastro-esophageal reflux disease without esophagitis: Secondary | ICD-10-CM | POA: Diagnosis present

## 2015-06-15 DIAGNOSIS — Z79899 Other long term (current) drug therapy: Secondary | ICD-10-CM | POA: Diagnosis not present

## 2015-06-15 DIAGNOSIS — M5136 Other intervertebral disc degeneration, lumbar region: Secondary | ICD-10-CM | POA: Diagnosis not present

## 2015-06-15 DIAGNOSIS — Z8249 Family history of ischemic heart disease and other diseases of the circulatory system: Secondary | ICD-10-CM | POA: Diagnosis not present

## 2015-06-15 DIAGNOSIS — M4317 Spondylolisthesis, lumbosacral region: Secondary | ICD-10-CM | POA: Diagnosis present

## 2015-06-15 HISTORY — PX: ABDOMINAL EXPOSURE: SHX5708

## 2015-06-15 HISTORY — PX: ANTERIOR LUMBAR FUSION: SHX1170

## 2015-06-15 LAB — GLUCOSE, CAPILLARY
GLUCOSE-CAPILLARY: 162 mg/dL — AB (ref 65–99)
GLUCOSE-CAPILLARY: 183 mg/dL — AB (ref 65–99)
GLUCOSE-CAPILLARY: 201 mg/dL — AB (ref 65–99)
GLUCOSE-CAPILLARY: 201 mg/dL — AB (ref 65–99)

## 2015-06-15 SURGERY — ANTERIOR LUMBAR FUSION 1 LEVEL
Anesthesia: General | Site: Spine Lumbar

## 2015-06-15 MED ORDER — DIAZEPAM 5 MG PO TABS
5.0000 mg | ORAL_TABLET | Freq: Four times a day (QID) | ORAL | Status: DC | PRN
  Administered 2015-06-16: 5 mg via ORAL
  Filled 2015-06-15: qty 1

## 2015-06-15 MED ORDER — DEXAMETHASONE SODIUM PHOSPHATE 4 MG/ML IJ SOLN
INTRAMUSCULAR | Status: DC | PRN
  Administered 2015-06-15: 4 mg via INTRAVENOUS

## 2015-06-15 MED ORDER — ROCURONIUM BROMIDE 50 MG/5ML IV SOLN
INTRAVENOUS | Status: AC
  Filled 2015-06-15: qty 2

## 2015-06-15 MED ORDER — PROPOFOL 10 MG/ML IV BOLUS
INTRAVENOUS | Status: AC
  Filled 2015-06-15: qty 20

## 2015-06-15 MED ORDER — GLYCOPYRROLATE 0.2 MG/ML IJ SOLN
INTRAMUSCULAR | Status: DC | PRN
  Administered 2015-06-15: 0.6 mg via INTRAVENOUS

## 2015-06-15 MED ORDER — DEXAMETHASONE SODIUM PHOSPHATE 4 MG/ML IJ SOLN
INTRAMUSCULAR | Status: AC
  Filled 2015-06-15: qty 1

## 2015-06-15 MED ORDER — THROMBIN 20000 UNITS EX SOLR
CUTANEOUS | Status: DC | PRN
  Administered 2015-06-15: 20 mL via TOPICAL

## 2015-06-15 MED ORDER — CHLORHEXIDINE GLUCONATE 4 % EX LIQD: 60.0000 mL | Freq: Once | CUTANEOUS | Status: DC

## 2015-06-15 MED ORDER — MENTHOL 3 MG MT LOZG: 1.0000 | LOZENGE | OROMUCOSAL | Status: DC | PRN

## 2015-06-15 MED ORDER — LACTATED RINGERS IV SOLN
INTRAVENOUS | Status: DC | PRN
  Administered 2015-06-15 (×3): via INTRAVENOUS

## 2015-06-15 MED ORDER — CEFAZOLIN SODIUM-DEXTROSE 2-3 GM-% IV SOLR
2.0000 g | INTRAVENOUS | Status: AC
  Administered 2015-06-15: 2 g via INTRAVENOUS

## 2015-06-15 MED ORDER — ACETAMINOPHEN 650 MG RE SUPP: 650.0000 mg | RECTAL | Status: DC | PRN

## 2015-06-15 MED ORDER — DOCUSATE SODIUM 100 MG PO CAPS: 100.0000 mg | ORAL_CAPSULE | Freq: Three times a day (TID) | ORAL | Status: DC | PRN

## 2015-06-15 MED ORDER — MIDAZOLAM HCL 2 MG/2ML IJ SOLN
INTRAMUSCULAR | Status: AC
  Filled 2015-06-15: qty 2

## 2015-06-15 MED ORDER — SUCCINYLCHOLINE CHLORIDE 20 MG/ML IJ SOLN
INTRAMUSCULAR | Status: AC
  Filled 2015-06-15: qty 1

## 2015-06-15 MED ORDER — ARTIFICIAL TEARS OP OINT
TOPICAL_OINTMENT | OPHTHALMIC | Status: AC
  Filled 2015-06-15: qty 3.5

## 2015-06-15 MED ORDER — SODIUM CHLORIDE 0.9 % IV SOLN: 250.0000 mL | INTRAVENOUS | Status: DC

## 2015-06-15 MED ORDER — SORBITOL 70 % SOLN: 30.0000 mL | Freq: Every day | Status: DC | PRN

## 2015-06-15 MED ORDER — MAGNESIUM HYDROXIDE 400 MG/5ML PO SUSP: 30.0000 mL | Freq: Every day | ORAL | Status: DC | PRN

## 2015-06-15 MED ORDER — SUCRALFATE 1 GM/10ML PO SUSP
1.0000 g | Freq: Three times a day (TID) | ORAL | Status: DC
Start: 2015-06-15 — End: 2015-06-16
  Administered 2015-06-15 – 2015-06-16 (×3): 1 g via ORAL
  Filled 2015-06-15 (×7): qty 10

## 2015-06-15 MED ORDER — NEOSTIGMINE METHYLSULFATE 10 MG/10ML IV SOLN
INTRAVENOUS | Status: AC
  Filled 2015-06-15: qty 1

## 2015-06-15 MED ORDER — MIDAZOLAM HCL 5 MG/5ML IJ SOLN
INTRAMUSCULAR | Status: DC | PRN
  Administered 2015-06-15: 2 mg via INTRAVENOUS

## 2015-06-15 MED ORDER — PANTOPRAZOLE SODIUM 40 MG PO TBEC
40.0000 mg | DELAYED_RELEASE_TABLET | Freq: Every day | ORAL | Status: DC
  Administered 2015-06-16: 40 mg via ORAL
  Filled 2015-06-15: qty 1

## 2015-06-15 MED ORDER — LIDOCAINE HCL (CARDIAC) 20 MG/ML IV SOLN
INTRAVENOUS | Status: AC
  Filled 2015-06-15: qty 5

## 2015-06-15 MED ORDER — ATORVASTATIN CALCIUM 40 MG PO TABS
40.0000 mg | ORAL_TABLET | Freq: Every day | ORAL | Status: DC
  Administered 2015-06-15: 40 mg via ORAL
  Filled 2015-06-15 (×2): qty 1

## 2015-06-15 MED ORDER — ACETAMINOPHEN 325 MG PO TABS: 650.0000 mg | ORAL_TABLET | ORAL | Status: DC | PRN

## 2015-06-15 MED ORDER — SODIUM CHLORIDE 0.9 % IV SOLN: INTRAVENOUS | Status: DC

## 2015-06-15 MED ORDER — HYDROMORPHONE HCL 1 MG/ML IJ SOLN
0.2500 mg | INTRAMUSCULAR | Status: DC | PRN
  Administered 2015-06-15 (×2): 0.5 mg via INTRAVENOUS

## 2015-06-15 MED ORDER — ONDANSETRON HCL 4 MG/2ML IJ SOLN
INTRAMUSCULAR | Status: AC
  Filled 2015-06-15: qty 2

## 2015-06-15 MED ORDER — FENTANYL CITRATE (PF) 100 MCG/2ML IJ SOLN
INTRAMUSCULAR | Status: DC | PRN
  Administered 2015-06-15 (×5): 50 ug via INTRAVENOUS

## 2015-06-15 MED ORDER — ALUM & MAG HYDROXIDE-SIMETH 200-200-20 MG/5ML PO SUSP: 30.0000 mL | Freq: Four times a day (QID) | ORAL | Status: DC | PRN

## 2015-06-15 MED ORDER — METFORMIN HCL 500 MG PO TABS
1000.0000 mg | ORAL_TABLET | Freq: Two times a day (BID) | ORAL | Status: DC
  Administered 2015-06-15 – 2015-06-16 (×2): 1000 mg via ORAL
  Filled 2015-06-15 (×4): qty 2

## 2015-06-15 MED ORDER — POLYETHYLENE GLYCOL 3350 17 GM/SCOOP PO POWD: 17.0000 g | Freq: Two times a day (BID) | ORAL | Status: DC

## 2015-06-15 MED ORDER — FENTANYL CITRATE (PF) 250 MCG/5ML IJ SOLN
INTRAMUSCULAR | Status: AC
  Filled 2015-06-15: qty 5

## 2015-06-15 MED ORDER — ONDANSETRON HCL 4 MG/2ML IJ SOLN: 4.0000 mg | INTRAMUSCULAR | Status: DC | PRN

## 2015-06-15 MED ORDER — SODIUM CHLORIDE 0.9 % IJ SOLN: 3.0000 mL | INTRAMUSCULAR | Status: DC | PRN

## 2015-06-15 MED ORDER — HYDROCODONE-ACETAMINOPHEN 5-325 MG PO TABS: 1.0000 | ORAL_TABLET | ORAL | Status: DC | PRN

## 2015-06-15 MED ORDER — SODIUM CHLORIDE 0.9 % IJ SOLN
INTRAMUSCULAR | Status: AC
  Filled 2015-06-15: qty 10

## 2015-06-15 MED ORDER — FLEET ENEMA 7-19 GM/118ML RE ENEM: 1.0000 | ENEMA | Freq: Once | RECTAL | Status: AC | PRN

## 2015-06-15 MED ORDER — DICYCLOMINE HCL 20 MG PO TABS: 20.0000 mg | ORAL_TABLET | Freq: Two times a day (BID) | ORAL | Status: DC | PRN

## 2015-06-15 MED ORDER — HYDROMORPHONE HCL 1 MG/ML IJ SOLN
INTRAMUSCULAR | Status: AC
  Filled 2015-06-15: qty 1

## 2015-06-15 MED ORDER — 0.9 % SODIUM CHLORIDE (POUR BTL) OPTIME
TOPICAL | Status: DC | PRN
Start: 2015-06-15 — End: 2015-06-15
  Administered 2015-06-15: 1000 mL

## 2015-06-15 MED ORDER — ONDANSETRON HCL 4 MG PO TABS: 4.0000 mg | ORAL_TABLET | Freq: Three times a day (TID) | ORAL | Status: DC | PRN

## 2015-06-15 MED ORDER — ARTIFICIAL TEARS OP OINT
TOPICAL_OINTMENT | OPHTHALMIC | Status: DC | PRN
  Administered 2015-06-15: 1 via OPHTHALMIC

## 2015-06-15 MED ORDER — OXYCODONE-ACETAMINOPHEN 5-325 MG PO TABS
ORAL_TABLET | ORAL | Status: AC
  Filled 2015-06-15: qty 2

## 2015-06-15 MED ORDER — FLUTICASONE PROPIONATE 50 MCG/ACT NA SUSP
2.0000 | Freq: Every day | NASAL | Status: DC
  Administered 2015-06-16: 2 via NASAL
  Filled 2015-06-15: qty 16

## 2015-06-15 MED ORDER — DOXAZOSIN MESYLATE 4 MG PO TABS
4.0000 mg | ORAL_TABLET | Freq: Every day | ORAL | Status: DC
  Administered 2015-06-16: 4 mg via ORAL
  Filled 2015-06-15: qty 1

## 2015-06-15 MED ORDER — HYDROCHLOROTHIAZIDE 25 MG PO TABS
25.0000 mg | ORAL_TABLET | Freq: Every day | ORAL | Status: DC
  Administered 2015-06-16: 25 mg via ORAL
  Filled 2015-06-15: qty 1

## 2015-06-15 MED ORDER — LORATADINE 10 MG PO TABS
10.0000 mg | ORAL_TABLET | Freq: Every day | ORAL | Status: DC
  Administered 2015-06-16: 10 mg via ORAL
  Filled 2015-06-15: qty 1

## 2015-06-15 MED ORDER — EPHEDRINE SULFATE 50 MG/ML IJ SOLN
INTRAMUSCULAR | Status: AC
  Filled 2015-06-15: qty 1

## 2015-06-15 MED ORDER — CEFAZOLIN SODIUM 1-5 GM-% IV SOLN
1.0000 g | Freq: Three times a day (TID) | INTRAVENOUS | Status: AC
Start: 2015-06-15 — End: 2015-06-16
  Administered 2015-06-15 – 2015-06-16 (×2): 1 g via INTRAVENOUS
  Filled 2015-06-15 (×2): qty 50

## 2015-06-15 MED ORDER — AMLODIPINE BESYLATE 10 MG PO TABS
10.0000 mg | ORAL_TABLET | Freq: Every day | ORAL | Status: DC
  Administered 2015-06-16: 10 mg via ORAL
  Filled 2015-06-15: qty 1

## 2015-06-15 MED ORDER — HYOSCYAMINE SULFATE 0.125 MG PO TBDP: 0.1250 mg | ORAL_TABLET | Freq: Four times a day (QID) | ORAL | Status: DC | PRN

## 2015-06-15 MED ORDER — SODIUM CHLORIDE 0.9 % IJ SOLN
3.0000 mL | Freq: Two times a day (BID) | INTRAMUSCULAR | Status: DC
  Administered 2015-06-15: 3 mL via INTRAVENOUS

## 2015-06-15 MED ORDER — ROCURONIUM BROMIDE 100 MG/10ML IV SOLN
INTRAVENOUS | Status: DC | PRN
  Administered 2015-06-15: 10 mg via INTRAVENOUS
  Administered 2015-06-15: 50 mg via INTRAVENOUS
  Administered 2015-06-15: 10 mg via INTRAVENOUS

## 2015-06-15 MED ORDER — THROMBIN 20000 UNITS EX SOLR
CUTANEOUS | Status: AC
  Filled 2015-06-15: qty 20000

## 2015-06-15 MED ORDER — NEOSTIGMINE METHYLSULFATE 10 MG/10ML IV SOLN
INTRAVENOUS | Status: DC | PRN
  Administered 2015-06-15: 4 mg via INTRAVENOUS

## 2015-06-15 MED ORDER — GLYCOPYRROLATE 0.2 MG/ML IJ SOLN
INTRAMUSCULAR | Status: AC
  Filled 2015-06-15: qty 3

## 2015-06-15 MED ORDER — ZOLPIDEM TARTRATE 5 MG PO TABS: 5.0000 mg | ORAL_TABLET | Freq: Every evening | ORAL | Status: DC | PRN

## 2015-06-15 MED ORDER — PROPOFOL 10 MG/ML IV BOLUS
INTRAVENOUS | Status: DC | PRN
  Administered 2015-06-15: 20 mg via INTRAVENOUS
  Administered 2015-06-15: 150 mg via INTRAVENOUS

## 2015-06-15 MED ORDER — CEFAZOLIN SODIUM-DEXTROSE 2-3 GM-% IV SOLR
INTRAVENOUS | Status: AC
Start: 2015-06-15 — End: 2015-06-15
  Filled 2015-06-15: qty 50

## 2015-06-15 MED ORDER — OXYCODONE-ACETAMINOPHEN 5-325 MG PO TABS
1.0000 | ORAL_TABLET | ORAL | Status: DC | PRN
  Administered 2015-06-15 – 2015-06-16 (×5): 2 via ORAL
  Filled 2015-06-15 (×5): qty 2

## 2015-06-15 MED ORDER — METHOCARBAMOL 500 MG PO TABS
500.0000 mg | ORAL_TABLET | Freq: Two times a day (BID) | ORAL | Status: DC
  Administered 2015-06-15 – 2015-06-16 (×2): 500 mg via ORAL
  Filled 2015-06-15 (×4): qty 1

## 2015-06-15 MED ORDER — PHENOL 1.4 % MT LIQD: 1.0000 | OROMUCOSAL | Status: DC | PRN

## 2015-06-15 MED ORDER — ONDANSETRON HCL 4 MG/2ML IJ SOLN
INTRAMUSCULAR | Status: DC | PRN
Start: 2015-06-15 — End: 2015-06-15
  Administered 2015-06-15: 4 mg via INTRAVENOUS

## 2015-06-15 MED ORDER — MORPHINE SULFATE 2 MG/ML IJ SOLN
1.0000 mg | INTRAMUSCULAR | Status: DC | PRN
  Administered 2015-06-15: 4 mg via INTRAVENOUS
  Filled 2015-06-15: qty 2

## 2015-06-15 MED ORDER — POVIDONE-IODINE 7.5 % EX SOLN
Freq: Once | CUTANEOUS | Status: DC
  Filled 2015-06-15: qty 118

## 2015-06-15 SURGICAL SUPPLY — 97 items
ADH SKN CLS APL DERMABOND .7 (GAUZE/BANDAGES/DRESSINGS) ×2
APL SKNCLS STERI-STRIP NONHPOA (GAUZE/BANDAGES/DRESSINGS)
APPLIER CLIP 11 MED OPEN (CLIP) ×3
APR CLP MED 11 20 MLT OPN (CLIP) ×2
BENZOIN TINCTURE PRP APPL 2/3 (GAUZE/BANDAGES/DRESSINGS) IMPLANT
BLADE SURG 10 STRL SS (BLADE) ×3 IMPLANT
BLADE SURG ROTATE 9660 (MISCELLANEOUS) IMPLANT
CAGE COUGAR ALIF MED 16-5 (Cage) ×1 IMPLANT
CLIP APPLIE 11 MED OPEN (CLIP) ×2 IMPLANT
CLIP LIGATING EXTRA MED SLVR (CLIP) ×3 IMPLANT
CLIP LIGATING EXTRA SM BLUE (MISCELLANEOUS) ×3 IMPLANT
CORDS BIPOLAR (ELECTRODE) ×3 IMPLANT
COVER MAYO STAND STRL (DRAPES) ×2 IMPLANT
COVER SURGICAL LIGHT HANDLE (MISCELLANEOUS) ×3 IMPLANT
DERMABOND ADVANCED (GAUZE/BANDAGES/DRESSINGS) ×1
DERMABOND ADVANCED .7 DNX12 (GAUZE/BANDAGES/DRESSINGS) ×2 IMPLANT
DRAPE C-ARM 42X72 X-RAY (DRAPES) ×6 IMPLANT
DRAPE INCISE IOBAN 66X45 STRL (DRAPES) IMPLANT
DRAPE POUCH INSTRU U-SHP 10X18 (DRAPES) ×3 IMPLANT
DRAPE SURG 17X23 STRL (DRAPES) ×9 IMPLANT
DRSG MEPILEX BORDER 4X12 (GAUZE/BANDAGES/DRESSINGS) ×3 IMPLANT
DRSG MEPILEX BORDER 4X8 (GAUZE/BANDAGES/DRESSINGS) ×1 IMPLANT
DURAPREP 26ML APPLICATOR (WOUND CARE) ×3 IMPLANT
ELECT BLADE 4.0 EZ CLEAN MEGAD (MISCELLANEOUS) ×3
ELECT CAUTERY BLADE 6.4 (BLADE) ×3 IMPLANT
ELECT REM PT RETURN 9FT ADLT (ELECTROSURGICAL) ×3
ELECTRODE BLDE 4.0 EZ CLN MEGD (MISCELLANEOUS) ×2 IMPLANT
ELECTRODE REM PT RTRN 9FT ADLT (ELECTROSURGICAL) ×2 IMPLANT
GAUZE SPONGE 4X4 12PLY STRL (GAUZE/BANDAGES/DRESSINGS) ×1 IMPLANT
GAUZE SPONGE 4X4 16PLY XRAY LF (GAUZE/BANDAGES/DRESSINGS) IMPLANT
GLOVE BIO SURGEON STRL SZ7 (GLOVE) ×3 IMPLANT
GLOVE BIO SURGEON STRL SZ8 (GLOVE) ×3 IMPLANT
GLOVE BIOGEL PI IND STRL 7.0 (GLOVE) ×2 IMPLANT
GLOVE BIOGEL PI IND STRL 8 (GLOVE) ×4 IMPLANT
GLOVE BIOGEL PI INDICATOR 7.0 (GLOVE) ×1
GLOVE BIOGEL PI INDICATOR 8 (GLOVE) ×2
GLOVE SS BIOGEL STRL SZ 7.5 (GLOVE) ×2 IMPLANT
GLOVE SUPERSENSE BIOGEL SZ 7.5 (GLOVE) ×1
GOWN STRL REUS W/ TWL LRG LVL3 (GOWN DISPOSABLE) ×10 IMPLANT
GOWN STRL REUS W/ TWL XL LVL3 (GOWN DISPOSABLE) ×2 IMPLANT
GOWN STRL REUS W/TWL LRG LVL3 (GOWN DISPOSABLE) ×15
GOWN STRL REUS W/TWL XL LVL3 (GOWN DISPOSABLE) ×3
HEMOSTAT SURGICEL 2X14 (HEMOSTASIS) IMPLANT
INSERT FOGARTY 61MM (MISCELLANEOUS) IMPLANT
INSERT FOGARTY SM (MISCELLANEOUS) IMPLANT
KIT BASIN OR (CUSTOM PROCEDURE TRAY) ×3 IMPLANT
KIT ROOM TURNOVER OR (KITS) ×6 IMPLANT
LOOP VESSEL MAXI BLUE (MISCELLANEOUS) IMPLANT
LOOP VESSEL MINI RED (MISCELLANEOUS) IMPLANT
MIX DBX 10CC 35% BONE (Bone Implant) ×1 IMPLANT
NDL HYPO 25GX1X1/2 BEV (NEEDLE) ×2 IMPLANT
NDL SPNL 18GX3.5 QUINCKE PK (NEEDLE) ×2 IMPLANT
NEEDLE HYPO 25GX1X1/2 BEV (NEEDLE) ×3 IMPLANT
NEEDLE SPNL 18GX3.5 QUINCKE PK (NEEDLE) ×3 IMPLANT
NS IRRIG 1000ML POUR BTL (IV SOLUTION) ×3 IMPLANT
PACK LAMINECTOMY ORTHO (CUSTOM PROCEDURE TRAY) ×3 IMPLANT
PACK UNIVERSAL I (CUSTOM PROCEDURE TRAY) ×3 IMPLANT
PAD ARMBOARD 7.5X6 YLW CONV (MISCELLANEOUS) ×12 IMPLANT
PIN FIXATION (PIN) ×2 IMPLANT
PLATE AEGIS 25MM (Plate) ×1 IMPLANT
SCREW AEGIS 24MM (Screw) ×4 IMPLANT
SPONGE INTESTINAL PEANUT (DISPOSABLE) ×12 IMPLANT
SPONGE LAP 18X18 X RAY DECT (DISPOSABLE) IMPLANT
SPONGE LAP 4X18 X RAY DECT (DISPOSABLE) IMPLANT
SPONGE SURGIFOAM ABS GEL 100 (HEMOSTASIS) ×6 IMPLANT
STAPLER VISISTAT 35W (STAPLE) IMPLANT
STRIP CLOSURE SKIN 1/2X4 (GAUZE/BANDAGES/DRESSINGS) IMPLANT
SURGIFLO TRUKIT (HEMOSTASIS) IMPLANT
SUT MNCRL AB 4-0 PS2 18 (SUTURE) ×7 IMPLANT
SUT PDS AB 1 CTX 36 (SUTURE) ×6 IMPLANT
SUT PROLENE 4 0 RB 1 (SUTURE) ×12
SUT PROLENE 4-0 RB1 .5 CRCL 36 (SUTURE) ×8 IMPLANT
SUT PROLENE 5 0 C 1 24 (SUTURE) IMPLANT
SUT PROLENE 5 0 CC1 (SUTURE) IMPLANT
SUT PROLENE 6 0 C 1 30 (SUTURE) ×3 IMPLANT
SUT PROLENE 6 0 CC (SUTURE) IMPLANT
SUT SILK 0 TIES 10X30 (SUTURE) ×3 IMPLANT
SUT SILK 2 0 TIES 10X30 (SUTURE) ×6 IMPLANT
SUT SILK 2 0SH CR/8 30 (SUTURE) IMPLANT
SUT SILK 3 0 TIES 10X30 (SUTURE) ×6 IMPLANT
SUT SILK 3 0SH CR/8 30 (SUTURE) IMPLANT
SUT VIC AB 0 CT1 27 (SUTURE) ×3
SUT VIC AB 0 CT1 27XBRD ANBCTR (SUTURE) ×2 IMPLANT
SUT VIC AB 1 CT1 27 (SUTURE) ×6
SUT VIC AB 1 CT1 27XBRD ANBCTR (SUTURE) ×4 IMPLANT
SUT VIC AB 1 CTX 36 (SUTURE) ×6
SUT VIC AB 1 CTX36XBRD ANBCTR (SUTURE) ×4 IMPLANT
SUT VIC AB 2-0 CT1 36 (SUTURE) ×3 IMPLANT
SUT VIC AB 2-0 CT2 18 VCP726D (SUTURE) ×3 IMPLANT
SUT VIC AB 3-0 SH 27 (SUTURE) ×3
SUT VIC AB 3-0 SH 27X BRD (SUTURE) ×2 IMPLANT
SYR BULB IRRIGATION 50ML (SYRINGE) ×3 IMPLANT
TOWEL OR 17X24 6PK STRL BLUE (TOWEL DISPOSABLE) ×3 IMPLANT
TOWEL OR 17X26 10 PK STRL BLUE (TOWEL DISPOSABLE) ×3 IMPLANT
TRAY FOLEY CATH 16FR SILVER (SET/KITS/TRAYS/PACK) ×3 IMPLANT
WATER STERILE IRR 1000ML POUR (IV SOLUTION) ×3 IMPLANT
YANKAUER SUCT BULB TIP NO VENT (SUCTIONS) ×3 IMPLANT

## 2015-06-15 NOTE — Op Note (Signed)
    OPERATIVE REPORT  DATE OF SURGERY: 06/15/2015  PATIENT: Brian Dominguez, 69 y.o. male MRN: [id number]  DOB: 12/08/1946  PRE-OPERATIVE DIAGNOSIS: Degenerative disc disease L5-S1  POST-OPERATIVE DIAGNOSIS:  Same  PROCEDURE: Anterior exposure for L5-S1 disc surgery  SURGEON:  Gretta Beganodd Hollis Oh, M.D.  Co-surgeon for the exposure: Dr Yevette Edwardsumonski  ANESTHESIA:  Gen.  EBL: 200 ml  Total I/O In: 2200 [I.V.:2200] Out: 600 [Urine:375; Blood:225]  BLOOD ADMINISTERED: None  DRAINS: None    COUNTS CORRECT:  YES  PLAN OF CARE: PACU   PATIENT DISPOSITION:  PACU - hemodynamically stable  PROCEDURE DETAILS: The patient was taken to the operative placed supine position where the area of the abdomen was marked with the C-arm projection over the area of the L5-S1 disc. Incision was made over this area after the patient was prepped and draped in sterile fashion. Incision was made from the midline to the left and transverse incision. This was carried down through the subcutaneous fat to the level of the fascia. Fat was mobilized off the fascia. The anterior rectus sheath was opened with electrocautery in line with skin incision. The rectus muscle was mobilized circumferentially. The retroperitoneal space was entered and the left lower quadrant below the level of the semilunar line. The peritoneal contents were mobilized to the right and the posterior rectus sheath was divided at the lateral edge of for better mobilization. Blunt dissection was continued above the level of the psoas muscle. Iliac vessels were identified and had moderate atherosclerotic change. Blunt dissection was continued over the anterior surface of the L5-S1 disc. Middle sacral vessels were clipped with hemoclips and divided. Dissection was continued superiorly and inferior to give adequate exposure to the L5-S1 disc. The Thompson retractor was brought onto the field and the reverse lip 150 blades were positioned to the right and left of  the L5-S1 disc. Self-retaining malleable retractors were used for superior and inferior exposure. A marker needle was placed in the L5-S1 disc and C-arm was brought to confirm that this was the disc of interest. The remainder of the dictation will be dictated as a separate note by Dr. Arlys Johnumonski   [name], M.D. 06/15/2015 12:41 PM

## 2015-06-15 NOTE — Evaluation (Signed)
Physical Therapy Evaluation Patient Details Name: Brian JakesJohn A Dominguez MRN: [id number] DOB: 10/16/1946 Today's Date: 06/15/2015   History of Present Illness  pt is a 69 y/o male admitted with back pain due to stenosis at L5S1, s/p anterior approach discectomy and fusion at L5/S1.  Clinical Impression  Pt admitted with/for lumbar fusion surgery.  Pt currently limited functionally due to the problems listed below.  (see problems list.)  Pt will benefit from PT to maximize function and safety to be able to get home safely with available assist of roommate.     Follow Up Recommendations No PT follow up    Equipment Recommendations  None recommended by PT (3 in 1)    Recommendations for Other Services       Precautions / Restrictions Precautions Precautions: Back Required Braces or Orthoses: Spinal Brace Spinal Brace: Thoracolumbosacral orthotic;Other (comment) (with abd component) Restrictions Weight Bearing Restrictions: No      Mobility  Bed Mobility Overal bed mobility: Needs Assistance Bed Mobility: Sidelying to Sit;Rolling Rolling: Supervision Sidelying to sit: Supervision       General bed mobility comments: instructed in log roll and transition side to/from sitting  Transfers Overall transfer level: Needs assistance   Transfers: Sit to/from Stand Sit to Stand: Min guard         General transfer comment: cues for hand placement and dealing with brace  Ambulation/Gait Ambulation/Gait assistance: Supervision Ambulation Distance (Feet): 250 Feet Assistive device: None Gait Pattern/deviations: Step-through pattern   Gait velocity interpretation: at or above normal speed for age/gender General Gait Details: steady and moderate speed  Stairs            Wheelchair Mobility    Modified Rankin (Stroke Patients Only)       Balance Overall balance assessment: No apparent balance deficits (not formally assessed)                                            Pertinent Vitals/Pain Pain Assessment: Faces Faces Pain Scale: Hurts little more Pain Location: back Pain Descriptors / Indicators: Grimacing Pain Intervention(s): Monitored during session    Home Living Family/patient expects to be discharged to:: Private residence Living Arrangements: Other (Comment) (room mates) Available Help at Discharge: Friend(s) (room mates PRN) Type of Home: House Home Access: Level entry     Home Layout: One level Home Equipment: None      Prior Function Level of Independence: Independent               Hand Dominance   Dominant Hand: Right    Extremity/Trunk Assessment   Upper Extremity Assessment: Defer to OT evaluation           Lower Extremity Assessment: Overall WFL for tasks assessed         Communication   Communication: No difficulties  Cognition Arousal/Alertness: Awake/alert Behavior During Therapy: WFL for tasks assessed/performed Overall Cognitive Status: Within Functional Limits for tasks assessed                      General Comments General comments (skin integrity, edema, etc.): instructed in back care/prec, log roll, transition to/from sitting, lifting precautions, progression of activity, bracing issues    Exercises        Assessment/Plan    PT Assessment Patient needs continued PT services  PT Diagnosis Acute pain   PT Problem  List Decreased activity tolerance;Decreased mobility;Decreased knowledge of precautions;Pain  PT Treatment Interventions Gait training;Functional mobility training;Therapeutic activities;Patient/family education   PT Goals (Current goals can be found in the Care Plan section) Acute Rehab PT Goals Patient Stated Goal: independent at home PT Goal Formulation: With patient Time For Goal Achievement: 06/22/15 Potential to Achieve Goals: Good    Frequency Min 5X/week   Barriers to discharge Decreased caregiver support      Co-evaluation                End of Session   Activity Tolerance: Patient tolerated treatment well Patient left: in chair;with call bell/phone within reach Nurse Communication: Mobility status         Time: 1610-9604 PT Time Calculation (min) (ACUTE ONLY): 24 min   Charges:   PT Evaluation $Initial PT Evaluation Tier I: 1 Procedure PT Treatments $Gait Training: 8-22 mins   PT G Codes:        Lazara Grieser, Eliseo Gum 06/15/2015, 4:54 PM  06/15/2015  [place] Bing, PT [phone]  (pager)

## 2015-06-15 NOTE — Anesthesia Procedure Notes (Signed)
Procedure Name: Intubation Date/Time: 06/15/2015 8:39 AM Performed by: Fransisca KaufmannMEYER, Jacqueleen Pulver E Pre-anesthesia Checklist: Patient identified, Emergency Drugs available, Suction available, Patient being monitored and Timeout performed Patient Re-evaluated:Patient Re-evaluated prior to inductionOxygen Delivery Method: Circle system utilized Preoxygenation: Pre-oxygenation with 100% oxygen Intubation Type: IV induction Ventilation: Mask ventilation without difficulty Laryngoscope Size: Miller and 3 Grade View: Grade I Tube type: Oral Tube size: 8.0 mm Number of attempts: 1 Airway Equipment and Method: Stylet Placement Confirmation: ETT inserted through vocal cords under direct vision,  positive ETCO2 and breath sounds checked- equal and bilateral Secured at: 23 cm Tube secured with: Tape Dental Injury: Teeth and Oropharynx as per pre-operative assessment

## 2015-06-15 NOTE — Anesthesia Preprocedure Evaluation (Addendum)
Anesthesia Evaluation  Patient identified by MRN, date of birth, ID band Patient awake    Reviewed: Allergy & Precautions, H&P , NPO status , Patient's Chart, lab work & pertinent test results  Airway Mallampati: I  TM Distance: >3 FB Neck ROM: Full    Dental no notable dental hx. (+) Edentulous Upper, Partial Lower, Dental Advisory Given   Pulmonary Current Smoker,  breath sounds clear to auscultation  Pulmonary exam normal       Cardiovascular hypertension, Pt. on medications Rhythm:Regular Rate:Normal     Neuro/Psych Anxiety Depression negative neurological ROS     GI/Hepatic Neg liver ROS, GERD-  Medicated,  Endo/Other  diabetes, Type 2, Oral Hypoglycemic Agents  Renal/GU negative Renal ROS  negative genitourinary   Musculoskeletal  (+) Arthritis -, Osteoarthritis,    Abdominal   Peds  Hematology negative hematology ROS (+)   Anesthesia Other Findings   Reproductive/Obstetrics negative OB ROS                            Anesthesia Physical Anesthesia Plan  ASA: III  Anesthesia Plan: General   Post-op Pain Management:    Induction: Intravenous  Airway Management Planned: Oral ETT  Additional Equipment:   Intra-op Plan:   Post-operative Plan: Extubation in OR  Informed Consent: I have reviewed the patients History and Physical, chart, labs and discussed the procedure including the risks, benefits and alternatives for the proposed anesthesia with the patient or authorized representative who has indicated his/her understanding and acceptance.   Dental advisory given  Plan Discussed with: CRNA  Anesthesia Plan Comments:         Anesthesia Quick Evaluation
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
## 2015-06-15 NOTE — Transfer of Care (Signed)
Immediate Anesthesia Transfer of Care Note  Patient: Brian JakesJohn A Dominguez  Procedure(s) Performed: Procedure(s) with comments: ANTERIOR LUMBAR FUSION 1 LEVEL (N/A) - Anterior lumbar interbody fusion, lumbar 5-sacrum 1 with instrumentation, allograft; as posted ABDOMINAL EXPOSURE (N/A)  Patient Location: PACU  Anesthesia Type:General  Level of Consciousness: awake, oriented and patient cooperative  Airway & Oxygen Therapy: Patient Spontanous Breathing and Patient connected to nasal cannula oxygen  Post-op Assessment: Report given to RN and Post -op Vital signs reviewed and stable  Post vital signs: Reviewed  Last Vitals:  Filed Vitals:   06/15/15 0642  BP: 144/80  Pulse: 87  Temp: 36.9 C  Resp: 20    Complications: No apparent anesthesia complications

## 2015-06-15 NOTE — Anesthesia Postprocedure Evaluation (Signed)
  Anesthesia Post-op Note  Patient: Brian JakesJohn A Dominguez  Procedure(s) Performed: Procedure(s) with comments: ANTERIOR LUMBAR FUSION 1 LEVEL (N/A) - Anterior lumbar interbody fusion, lumbar 5-sacrum 1 with instrumentation, allograft; as posted ABDOMINAL EXPOSURE (N/A)  Patient Location: PACU  Anesthesia Type:General  Level of Consciousness: awake and alert   Airway and Oxygen Therapy: Patient Spontanous Breathing  Post-op Pain: Controlled  Post-op Assessment: Post-op Vital signs reviewed, Patient's Cardiovascular Status Stable and Respiratory Function Stable  Post-op Vital Signs: Reviewed  Filed Vitals:   06/15/15 1245  BP: 166/72  Pulse: 93  Temp:   Resp: 14    Complications: No apparent anesthesia complications

## 2015-06-15 NOTE — Op Note (Signed)
NAME:  Brian Dominguez, Brian Dominguez                ACCOUNT NO.:  [id number]  MEDICAL RECORD NO.:  [id number]  LOCATION:  3C05C                        FACILITY:  [facility]  PHYSICIAN:  Estill BambergMark Janiyah Beery, MD      DATE OF BIRTH:  Nov 22, 1946  DATE OF PROCEDURE:  06/15/2015                              OPERATIVE REPORT   PREOPERATIVE DIAGNOSES: 1. Grade 1 dynamic L5-S1 spondylolisthesis. 2. Right-sided L5-S1 neuroforaminal stenosis. 3. Status post L3-S1 decompression.  POSTOPERATIVE DIAGNOSES: 1. Grade 1 dynamic L5-S1 spondylolisthesis. 2. Right-sided L5-S1 neuroforaminal stenosis. 3. Status post L3-S1 decompression.  PROCEDURE: 1. Anterior lumbar interbody fusion, L5-S1. 2. Insertion of interbody device x1 (16 mm, medium intervertebral     spacer with 5 degrees of lordosis). 3. Placement of anterior instrumentation, L5, S1. 4. Use of morselized allograft. 5. Fist assistant to Dr. Tawanna Coolerodd Early for exposure  SURGEON:  Estill BambergMark Phill Steck, MD  ASSISTANT:  Jason CoopKayla McKenzie, PA-C  ANESTHESIA:  General endotracheal anesthesia.  COMPLICATIONS:  None.  DISPOSITION:  Stable.  ESTIMATED BLOOD LOSS:  Minimal.  INDICATIONS FOR SURGERY:  Briefly, Brian Dominguez is a 69 year old male, who did present to me with ongoing rather debilitating pain in his right leg.  The patient's symptoms were very much consistent with right-sided L5 radiculopathy.  He did also report weakness in his right leg.  He has had physical therapy and injections without relief.  Given the patient's ongoing pain, we did discuss proceeding with the procedure reflected above.  The patient did fully understand the risks and limitations of the procedure and did elect to proceed.  OPERATIVE DETAILS:  On June 15, 2015, the patient was brought to surgery and general endotracheal anesthesia was administered.  The patient was placed supine on the hospital bed.  Antibiotics were given.  All bony prominences were padded.  The abdomen was prepped and draped in  the usual sterile fashion.  An anterior retroperitoneal approach was performed by Dr. Gretta Beganodd Early.  Of note, I did function as his first assistant for the approach.  Once the intervertebral space was identified and appropriately visualized, I did perform a standard diskectomy, starting with a #10 knife followed by a series of curettes and pituitary rongeurs and Kerrison punches.  The endplates were then prepared and I placed a series of intervertebral spacer trials.  I did feel that a 16 mm spacer with 5 degrees of lordosis would be the most appropriate fit.  The spacer was then packed with DBX mix and tamped into position to the level of approximately the posterior aspect of the vertebral body.  I did note an excellent press-fit.  I was very pleased with the images on both AP and lateral fluoroscopic imaging.  I then chose the appropriate-sized anterior lumbar plate, which was placed over the anterior lumbar spine.  It was slightly difficult positioning the plate appropriately, given the substantial bony spurring noted anteriorly.  However, I was able to appropriately position the plate.  I then used an awl and placed screws, 24 mm in length, 2 in each vertebral body at L5 and S1.  The screws were then locked to the plate using the CAM locking mechanism.  I was very pleased with  the final AP and lateral fluoroscopic images.  At this point, the wound was explored for any undue bleeding, and there was none encountered.  I then irrigated the wound copiously.  The fascia was then closed using #1 PDS.  The subcutaneous layer was closed using 0 Vicryl followed by 2-0 Vicryl, and the skin was closed using 3-0 Monocryl.  Benzoin and Steri-Strips were applied followed by sterile dressing.  All instrument counts were correct.  Of note, Jason Coop was my assistant throughout the entire surgery, and did aid in retraction, suctioning, and closure.     Estill Bamberg, MD     MD/MEDQ  D:   06/15/2015  T:  06/15/2015  Job:  [id number]

## 2015-06-16 ENCOUNTER — Encounter (HOSPITAL_COMMUNITY): Payer: Self-pay | Admitting: Orthopedic Surgery

## 2015-06-16 LAB — GLUCOSE, CAPILLARY: Glucose-Capillary: 191 mg/dL — ABNORMAL HIGH (ref 65–99)

## 2015-06-16 MED FILL — Heparin Sodium (Porcine) Inj 1000 Unit/ML: INTRAMUSCULAR | Qty: 30 | Status: AC

## 2015-06-16 MED FILL — Sodium Chloride IV Soln 0.9%: INTRAVENOUS | Qty: 1000 | Status: AC

## 2015-06-16 NOTE — Progress Notes (Signed)
    Patient doing well Incisional pain is minimal R leg pain resolved Patient just notes some minor tingling   Physical Exam: Filed Vitals:   06/16/15 0410  BP: 132/58  Pulse: 78  Temp: 98.6 F (37 C)  Resp: 18    Dressing in place NVI  POD #1 s/p L5/S1 ALIF, doing well, comfortable  - up with PT again this AM  - Percocet for pain, Valium for muscle spasms - brace when OOB - likely d/c home today

## 2015-06-16 NOTE — Progress Notes (Signed)
       Patient is comfortable, he has passed gas and tolerating liquids without difficulty.  Feet warm well perfused, AROM intact bil. Incisional dressing clean and dry, abdomin soft  S/P anterior abdominal exposure for Anterior lumbar interbody fusion, L5-S1. Disposition stable  Ludene Stokke MAUREEN PA-C

## 2015-06-16 NOTE — Progress Notes (Signed)
Occupational Therapy Treatment Patient Details Name: Shelda JakesJohn A Leoni MRN: [id number] DOB: 07/07/1946 Today's Date: 06/16/2015    History of present illness pt is a 69 y/o male admitted with back pain due to stenosis at L5S1, s/p anterior approach discectomy and fusion at L5/S1.   OT comments  Increased time on education this session. Pt requires assistance with donning/doffing brace. Leg bracket pulled out of torso piece when sitting down. [employer] notified. Nsg notified. Pt given AE to use for ADL. Increased independence with ADL after session. Recommend continuing with HHOT.   Follow Up Recommendations  Home health OT;Supervision - Intermittent    Equipment Recommendations  3 in 1 bedside comode    Recommendations for Other Services      Precautions / Restrictions Precautions Precautions: Back Precaution Comments: reviewed back precautions Required Braces or Orthoses: Spinal Brace Spinal Brace: Thoracolumbosacral orthotic (with L leg component)       Mobility Bed Mobility Overal bed mobility: Needs Assistance Bed Mobility: Sidelying to Sit Rolling: Supervision Sidelying to sit: Supervision   Sit to supine: Supervision Sit to sidelying: Supervision General bed mobility comments: improved demonstration from earlier session  Transfers Overall transfer level: Needs assistance   Transfers: Sit to/from Stand Sit to Stand: S         General transfer comment: cues for hand placement and dealing with brace  Cues for proper upright posture    Balance Overall balance assessment: No apparent balance deficits (not formally assessed)                                 ADL Overall ADL's : Needs assistance/impaired     Grooming: Supervision/safety   Upper Body Bathing: Set up;Sitting   Lower Body Bathing: Supervison/ safety;Cueing for safety;Cueing for sequencing;With adaptive equipment;Sit to/from stand   Upper Body Dressing : Moderate assistance (unable to  donn/doff TLSO independently)   Lower Body Dressing: Supervision/safety;Cueing for safety;With adaptive equipment;Cueing for sequencing;Sit to/from stand   Toilet Transfer: Min guard   Toileting- ArchitectClothing Manipulation and Hygiene: Supervision/safety;Sit to/from stand;With adaptive equipment   Tub/ Shower Transfer: Min guard   Functional mobility during ADLs: Supervision/safety (requires cues for following precautions during transfers) General ADL Comments: Pt educated on use of AE for ADLPt able to return demosntrate with min vc. Continued cues to not break presautions. Increased time spend teaching pt how to donn/doff TLSO. Pt unable to reach middle bracket to strap brace. Pt taught to donn in sitting by keeping leg side attached, then adjusting straps in the standing position. Pt sat before pulling up the leg bracket and the upper bracket pulled away from the torso piece. [employer] notified. Also asked [employer] if they could switch the strap/fastener in order to help the pt donn/doff the brace independently.                                      Cognition   Behavior During Therapy: WFL for tasks assessed/performed Overall Cognitive Status: Within Functional Limits for tasks assessed                       Extremity/Trunk Assessment  Upper Extremity Assessment Upper Extremity Assessment: Overall WFL for tasks assessed   Lower Extremity Assessment Lower Extremity Assessment: Defer to PT evaluation   Cervical / Trunk Assessment Cervical / Trunk Assessment: Normal  Pertinent Vitals/ Pain       Pain Assessment: 0-10 Faces Pain Scale: Hurts worst Pain Location: back Pain Descriptors / Indicators: Aching;Constant Pain Intervention(s): Limited activity within patient's tolerance;Monitored during session  Home Living Family/patient expects to be discharged to:: Private residence Living Arrangements: Other (Comment) (room  mates) Available Help at Discharge: Friend(s) (room mates PRN) Type of Home: House Home Access: Level entry     Home Layout: One level     Bathroom Shower/Tub: Tub/shower unit Shower/tub characteristics: Engineer, building services: Standard Bathroom Accessibility: Yes How Accessible: Accessible via walker Home Equipment: None          Prior Functioning/Environment Level of Independence: Independent            Frequency Min 3X/week     Progress Toward Goals  OT Goals(current goals can now be found in the care plan section)  Progress towards OT goals: Progressing toward goals  Acute Rehab OT Goals Patient Stated Goal: independent at home OT Goal Formulation: With patient Time For Goal Achievement: 06/30/15 Potential to Achieve Goals: Good ADL Goals Pt Will Perform Lower Body Bathing: with modified independence;with adaptive equipment;sit to/from stand Pt Will Perform Lower Body Dressing: with modified independence;with adaptive equipment;sit to/from stand Pt Will Transfer to Toilet: with modified independence;ambulating Pt Will Perform Toileting - Clothing Manipulation and hygiene: with modified independence;with adaptive equipment;sit to/from stand Additional ADL Goal #1: mod I with donning/doffing TLSO  Plan Discharge plan remains appropriate    Co-evaluation                 End of Session Equipment Utilized During Treatment: Back brace   Activity Tolerance Patient tolerated treatment well   Patient Left in chair;with call bell/phone within reach   Nurse Communication Mobility status;Other (comment) (brace needing repair prior to pt D/C)        Time: 1200-1240 OT Time Calculation (min): 40 min  Charges: OT General Charges $OT Visit: 1 Procedure  OT Treatments $Self Care/Home Management : 38-52 mins  Neysha Criado,HILLARY 06/16/2015, 1:32 PM  [facility], OTR/L  [phone] 06/16/2015

## 2015-06-16 NOTE — Care Management Note (Signed)
Case Management Note  Patient Details  Name: Brian JakesJohn A Dominguez MRN: [id number] Date of Birth: 10/21/1946  Subjective/Objective:                S/p ALIF    Action/Plan: Spoke with patient about home health. He selected [facility]. Contacted Pam with [facility] and set up HHPT and HHOT.    Expected Discharge Date:                  Expected Discharge Plan:  Home w Home Health Services  In-House Referral:  NA  Discharge planning Services  CM Consult  Post Acute Care Choice:  Home Health Choice offered to:  Patient  DME Arranged:    DME Agency:     HH Arranged:  PT, OT HH Agency:  [facility]  Status of Service:  Completed, signed off  Medicare Important Message Given:    Date Medicare IM Given:    Medicare IM give by:    Date Additional Medicare IM Given:    Additional Medicare Important Message give by:     If discussed at Long Length of Stay Meetings, dates discussed:    Additional Comments:  Monica BectonKrieg, Errika Narvaiz Watson, RN 06/16/2015, 11:10 AM

## 2015-06-16 NOTE — Progress Notes (Signed)
Physical Therapy Treatment Patient Details Name: Brian Dominguez MRN: [id number] DOB: 09/16/1946 Today's Date: 06/16/2015    History of Present Illness pt is a 69 y/o male admitted with back pain due to stenosis at L5S1, s/p anterior approach discectomy and fusion at L5/S1.    PT Comments    Patient having significant difficulty donning brace, requiring assistance to don, unable to  plsace straps through buckles on the side. OT to practice with patient again. May need to have brace modified to faciliate  Patient being able to don brace without breaking precautions. RECOMMEND HHPT.  Follow Up Recommendations  Home health PT;Supervision - Intermittent (with brace)     Equipment Recommendations  None recommended by PT    Recommendations for Other Services       Precautions / Restrictions Precautions Precautions: Back Precaution Comments: reviewed back precautions and provided  written instruction. Extensive assist required to assist patient to [facility] brace while standing. assisted patient  with shirt to  improve fit, patient is unable to reach straps and place throigh buckle on  one side. patient is bending and twisting during attempts to self don the brace.  Required Braces or Orthoses: Spinal Brace Spinal Brace: Thoracolumbosacral orthotic;Other (comment) (L leg extension)    Mobility  Bed Mobility   Bed Mobility: Sidelying to Sit   Sidelying to sit: Supervision       General bed mobility comments: instructed in log roll and transition side to/from sitting  Transfers Overall transfer level: Needs assistance   Transfers: Sit to/from Stand           General transfer comment: cues for hand placement and dealing with brace  Ambulation/Gait Ambulation/Gait assistance: Supervision Ambulation Distance (Feet): 200 Feet   Gait Pattern/deviations: WFL(Within Functional Limits)     General Gait Details: steady and moderate speed   Stairs            Wheelchair  Mobility    Modified Rankin (Stroke Patients Only)       Balance                                    Cognition Arousal/Alertness: Awake/alert                          Exercises      General Comments        Pertinent Vitals/Pain Faces Pain Scale: Hurts little more Pain Location: back Pain Descriptors / Indicators: Sore Pain Intervention(s): Monitored during session;Premedicated before session    Home Living                      Prior Function            PT Goals (current goals can now be found in the care plan section) Progress towards PT goals: Progressing toward goals    Frequency  Min 5X/week    PT Plan Discharge plan needs to be updated    Co-evaluation             End of Session   Activity Tolerance: Patient tolerated treatment well Patient left:  (with OT standing at edge of the bed.)     Time: 7829-5621 PT Time Calculation (min) (ACUTE ONLY): 20 min  Charges:  $Gait Training: 8-22 mins  G Codes:      [name], [name] [name] 06/16/2015, 9:41 AM [name] PT [phone]

## 2015-06-16 NOTE — Progress Notes (Signed)
Occupational Therapy Evaluation Patient Details Name: Brian Dominguez MRN: [id number] DOB: Nov 09, 1946 Today's Date: 06/16/2015    History of Present Illness pt is a 69 y/o male admitted with back pain due to stenosis at L5S1, s/p anterior approach discectomy and fusion at L5/S1.   Clinical Impression   PTA, pt independent with ADL and mobility. Began education on back precautions and managing TLSO. Pt with limited caregiver support and needs to be mod I prior to D/C. Will return to [place] additional session to address ADL and brace management with AE , DME and compensatory techniques. Nsg aware.     Follow Up Recommendations  Home health OT;Supervision - Intermittent    Equipment Recommendations  3 in 1 bedside comode    Recommendations for Other Services       Precautions / Restrictions Precautions Precautions: Back Precaution Comments: reviewed back precautions and provided  written instruction. Extensive assist required to assist patient to [facility] brace while standing. assisted patient  with shirt to  improve fit, patient is unable to reach straps and place throigh buckle on  one side. patient is bending and twisting during attempts to self don the brace.  Required Braces or Orthoses: Spinal Brace Spinal Brace: Thoracolumbosacral orthotic;Other (comment) (with leg component) Restrictions Weight Bearing Restrictions: No      Mobility Bed Mobility Overal bed mobility: Needs Assistance Bed Mobility: Rolling;Sit to Supine;Sit to Sidelying Rolling: Supervision Sidelying to sit: Supervision   Sit to supine: Supervision Sit to sidelying: Supervision General bed mobility comments: instructed in log roll and transition side to/from sitting  Transfers Overall transfer level: Needs assistance   Transfers: Sit to/from Stand Sit to Stand: Min guard         General transfer comment: cues for hand placement and dealing with brace    Balance Overall balance assessment: No  apparent balance deficits (not formally assessed)                                          ADL Overall ADL's : Needs assistance/impaired     Grooming: Supervision/safety   Upper Body Bathing: Set up;Sitting   Lower Body Bathing: Moderate assistance;Sit to/from stand   Upper Body Dressing : Moderate assistance (unable to donn/doff TLSO independently)   Lower Body Dressing: Moderate assistance;Sit to/from stand   Toilet Transfer: Min guard   Toileting- Architect and Hygiene: Maximal assistance   Tub/ Shower Transfer: Min guard   Functional mobility during ADLs: Min guard General ADL Comments: Began education regarding AE and compensatory techniques for ADL and functional mobility. Pt requires increased assistance for managing brace. Pt concerned about his ability to do this at home alone. Will plan to return to have pt return demonstrate the ability to manage brace and use AE for ADL. discussed with nsg.      Vision     Perception     Praxis      Pertinent Vitals/Pain Faces Pain Scale: Hurts little more Pain Location: back  Pain Descriptors / Indicators: Aching Pain Intervention(s): Limited activity within patient's tolerance;Monitored during session     Hand Dominance Right   Extremity/Trunk Assessment Upper Extremity Assessment Upper Extremity Assessment: Overall WFL for tasks assessed   Lower Extremity Assessment Lower Extremity Assessment: Defer to PT evaluation   Cervical / Trunk Assessment Cervical / Trunk Assessment: Normal   Communication Communication Communication: No difficulties   Cognition  Arousal/Alertness: Awake/alert Behavior During Therapy: WFL for tasks assessed/performed Overall Cognitive Status: Within Functional Limits for tasks assessed                     General Comments   Pt concerned about being able to manage his brace at home.     Exercises       Shoulder Instructions      Home Living  Family/patient expects to be discharged to:: Private residence Living Arrangements: Other (Comment) (room mates) Available Help at Discharge: Friend(s) (room mates PRN) Type of Home: House Home Access: Level entry     Home Layout: One level     Bathroom Shower/Tub: Tub/shower unit Shower/tub characteristics: Engineer, building servicesCurtain Bathroom Toilet: Standard Bathroom Accessibility: Yes How Accessible: Accessible via walker Home Equipment: None          Prior Functioning/Environment Level of Independence: Independent             OT Diagnosis: Generalized weakness;Acute pain   OT Problem List: Decreased range of motion;Decreased activity tolerance;Decreased safety awareness;Decreased knowledge of use of DME or AE;Decreased knowledge of precautions;Obesity;Pain   OT Treatment/Interventions: Self-care/ADL training;DME and/or AE instruction;Therapeutic activities;Patient/family education    OT Goals(Current goals can be found in the care plan section) Acute Rehab OT Goals Patient Stated Goal: independent at home OT Goal Formulation: With patient Time For Goal Achievement: 06/30/15 Potential to Achieve Goals: Good  OT Frequency: Min 3X/week   Barriers to D/C: Decreased caregiver support  lives in boarding house       Co-evaluation              End of Session Equipment Utilized During Treatment: Back brace Nurse Communication: Mobility status  Activity Tolerance: Patient tolerated treatment well Patient left: in bed;with call bell/phone within reach   Time: 0916-0928 OT Time Calculation (min): 12 min Charges:  OT General Charges $OT Visit: 1 Procedure OT Evaluation $Initial OT Evaluation Tier I: 1 Procedure G-Codes:    Fransico Sciandra,HILLARY 06/16/2015, 11:11 AM   Luisa DagoHilary Latricia Cerrito, OTR/L  [phone] 06/16/2015

## 2015-06-16 NOTE — Progress Notes (Signed)
Pt given D/C instructions with Rx's, verbal understanding was provided. Pt's incision is clean and dry with no sign of infection. Pt has back brace for home use. Pt's IV was removed prior to D/C. Home Health was arranged prior to D/C. Pt D/C'd home via wheelchair @ 1600 per MD order. Pt is stable @ D/C and has no other needs at this time. Rema FendtAshley Latice Waitman, RN
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
## 2015-08-10 ENCOUNTER — Encounter (HOSPITAL_COMMUNITY): Payer: Self-pay | Admitting: Emergency Medicine

## 2015-08-10 ENCOUNTER — Emergency Department (HOSPITAL_COMMUNITY)
Admission: EM | Admit: 2015-08-10 | Discharge: 2015-08-10 | Disposition: A | Discharge status: Home / Self Care | Payer: Medicare Other | Attending: Emergency Medicine | Admitting: Emergency Medicine

## 2015-08-10 DIAGNOSIS — L02511 Cutaneous abscess of right hand: Secondary | ICD-10-CM | POA: Insufficient documentation

## 2015-08-10 DIAGNOSIS — I10 Essential (primary) hypertension: Secondary | ICD-10-CM | POA: Insufficient documentation

## 2015-08-10 DIAGNOSIS — M1990 Unspecified osteoarthritis, unspecified site: Secondary | ICD-10-CM | POA: Insufficient documentation

## 2015-08-10 DIAGNOSIS — E119 Type 2 diabetes mellitus without complications: Secondary | ICD-10-CM | POA: Diagnosis not present

## 2015-08-10 DIAGNOSIS — Z7982 Long term (current) use of aspirin: Secondary | ICD-10-CM | POA: Diagnosis not present

## 2015-08-10 DIAGNOSIS — Z720 Tobacco use: Secondary | ICD-10-CM | POA: Diagnosis not present

## 2015-08-10 DIAGNOSIS — Z8619 Personal history of other infectious and parasitic diseases: Secondary | ICD-10-CM | POA: Insufficient documentation

## 2015-08-10 DIAGNOSIS — L0291 Cutaneous abscess, unspecified: Secondary | ICD-10-CM

## 2015-08-10 DIAGNOSIS — K219 Gastro-esophageal reflux disease without esophagitis: Secondary | ICD-10-CM | POA: Diagnosis not present

## 2015-08-10 DIAGNOSIS — Z79899 Other long term (current) drug therapy: Secondary | ICD-10-CM | POA: Diagnosis not present

## 2015-08-10 DIAGNOSIS — Z87828 Personal history of other (healed) physical injury and trauma: Secondary | ICD-10-CM | POA: Insufficient documentation

## 2015-08-10 MED ORDER — CEPHALEXIN 500 MG PO CAPS: 500.0000 mg | ORAL_CAPSULE | Freq: Two times a day (BID) | ORAL | Status: DC

## 2015-08-10 MED ORDER — CEPHALEXIN 500 MG PO CAPS
500.0000 mg | ORAL_CAPSULE | Freq: Once | ORAL | Status: AC
  Administered 2015-08-10: 500 mg via ORAL
  Filled 2015-08-10: qty 1

## 2015-08-10 MED ORDER — LIDOCAINE HCL (PF) 1 % IJ SOLN
5.0000 mL | Freq: Once | INTRAMUSCULAR | Status: AC
  Administered 2015-08-10: 5 mL
  Filled 2015-08-10: qty 5

## 2015-08-10 NOTE — ED Notes (Signed)
Pt states he has an abscess on his right arm  Pt states it started on Monday and has progressively gotten worse

## 2015-08-10 NOTE — Discharge Instructions (Signed)
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
## 2015-08-10 NOTE — ED Notes (Signed)
Pt has an abscess on the right posterior forearm.  Area is warm to the touch and red.

## 2015-08-31 ENCOUNTER — Emergency Department (HOSPITAL_COMMUNITY)
Admission: EM | Admit: 2015-08-31 | Discharge: 2015-08-31 | Disposition: A | Discharge status: Home / Self Care | Payer: Medicare Other | Attending: Physician Assistant | Admitting: Physician Assistant

## 2015-08-31 ENCOUNTER — Emergency Department (HOSPITAL_COMMUNITY): Payer: Medicare Other

## 2015-08-31 ENCOUNTER — Encounter (HOSPITAL_COMMUNITY): Payer: Self-pay | Admitting: *Deleted

## 2015-08-31 DIAGNOSIS — E119 Type 2 diabetes mellitus without complications: Secondary | ICD-10-CM | POA: Insufficient documentation

## 2015-08-31 DIAGNOSIS — K5900 Constipation, unspecified: Secondary | ICD-10-CM

## 2015-08-31 DIAGNOSIS — I10 Essential (primary) hypertension: Secondary | ICD-10-CM | POA: Insufficient documentation

## 2015-08-31 DIAGNOSIS — R109 Unspecified abdominal pain: Secondary | ICD-10-CM | POA: Diagnosis present

## 2015-08-31 DIAGNOSIS — Z720 Tobacco use: Secondary | ICD-10-CM | POA: Diagnosis not present

## 2015-08-31 DIAGNOSIS — Z87448 Personal history of other diseases of urinary system: Secondary | ICD-10-CM | POA: Insufficient documentation

## 2015-08-31 DIAGNOSIS — Z79899 Other long term (current) drug therapy: Secondary | ICD-10-CM | POA: Insufficient documentation

## 2015-08-31 DIAGNOSIS — K219 Gastro-esophageal reflux disease without esophagitis: Secondary | ICD-10-CM | POA: Insufficient documentation

## 2015-08-31 DIAGNOSIS — Z87828 Personal history of other (healed) physical injury and trauma: Secondary | ICD-10-CM | POA: Diagnosis not present

## 2015-08-31 DIAGNOSIS — Z7982 Long term (current) use of aspirin: Secondary | ICD-10-CM | POA: Diagnosis not present

## 2015-08-31 DIAGNOSIS — Z8619 Personal history of other infectious and parasitic diseases: Secondary | ICD-10-CM | POA: Diagnosis not present

## 2015-08-31 DIAGNOSIS — M1990 Unspecified osteoarthritis, unspecified site: Secondary | ICD-10-CM | POA: Insufficient documentation

## 2015-08-31 LAB — URINALYSIS, ROUTINE W REFLEX MICROSCOPIC
Bilirubin Urine: NEGATIVE
GLUCOSE, UA: NEGATIVE mg/dL
Hgb urine dipstick: NEGATIVE
Ketones, ur: NEGATIVE mg/dL
Leukocytes, UA: NEGATIVE
NITRITE: NEGATIVE
Protein, ur: NEGATIVE mg/dL
SPECIFIC GRAVITY, URINE: 1.019 (ref 1.005–1.030)
Urobilinogen, UA: 0.2 mg/dL (ref 0.0–1.0)
pH: 8 (ref 5.0–8.0)

## 2015-08-31 LAB — COMPREHENSIVE METABOLIC PANEL
ALBUMIN: 4.1 g/dL (ref 3.5–5.0)
ALT: 13 U/L — AB (ref 17–63)
AST: 19 U/L (ref 15–41)
Alkaline Phosphatase: 69 U/L (ref 38–126)
Anion gap: 9 (ref 5–15)
BUN: 8 mg/dL (ref 6–20)
CHLORIDE: 97 mmol/L — AB (ref 101–111)
CO2: 29 mmol/L (ref 22–32)
CREATININE: 0.9 mg/dL (ref 0.61–1.24)
Calcium: 9.5 mg/dL (ref 8.9–10.3)
GFR calc Af Amer: >60 mL/min (ref >60)
GLUCOSE: 141 mg/dL — AB (ref 65–99)
Potassium: 3.5 mmol/L (ref 3.5–5.1)
SODIUM: 135 mmol/L (ref 135–145)
Total Bilirubin: 0.6 mg/dL (ref 0.3–1.2)
Total Protein: 7.8 g/dL (ref 6.5–8.1)

## 2015-08-31 LAB — CBC
HEMATOCRIT: 41.4 % (ref 39.0–52.0)
Hemoglobin: 14.1 g/dL (ref 13.0–17.0)
MCH: 27.2 pg (ref 26.0–34.0)
MCHC: 34.1 g/dL (ref 30.0–36.0)
MCV: 79.9 fL (ref 78.0–100.0)
Platelets: 284 10*3/uL (ref 150–400)
RBC: 5.18 MIL/uL (ref 4.22–5.81)
RDW: 14.4 % (ref 11.5–15.5)
WBC: 4.4 10*3/uL (ref 4.0–10.5)

## 2015-08-31 LAB — LIPASE, BLOOD: LIPASE: 43 U/L (ref 22–51)

## 2015-08-31 LAB — I-STAT CG4 LACTIC ACID, ED
LACTIC ACID, VENOUS: 0.99 mmol/L (ref 0.5–2.0)
Lactic Acid, Venous: 1.43 mmol/L (ref 0.5–2.0)

## 2015-08-31 MED ORDER — ONDANSETRON HCL 4 MG PO TABS: 4.0000 mg | ORAL_TABLET | Freq: Three times a day (TID) | ORAL | Status: DC | PRN

## 2015-08-31 MED ORDER — HYDROMORPHONE HCL 1 MG/ML IJ SOLN
1.0000 mg | Freq: Once | INTRAMUSCULAR | Status: DC
  Filled 2015-08-31: qty 1

## 2015-08-31 MED ORDER — POLYETHYLENE GLYCOL 3350 17 G PO PACK: 17.0000 g | PACK | Freq: Every day | ORAL | Status: DC

## 2015-08-31 MED ORDER — IOHEXOL 300 MG/ML  SOLN
25.0000 mL | INTRAMUSCULAR | Status: AC
  Administered 2015-08-31: 25 mL via ORAL

## 2015-08-31 MED ORDER — IOHEXOL 300 MG/ML  SOLN
100.0000 mL | Freq: Once | INTRAMUSCULAR | Status: AC | PRN
  Administered 2015-08-31: 100 mL via INTRAVENOUS

## 2015-08-31 MED ORDER — DICYCLOMINE HCL 10 MG PO CAPS
10.0000 mg | ORAL_CAPSULE | Freq: Once | ORAL | Status: AC
  Administered 2015-08-31: 10 mg via ORAL
  Filled 2015-08-31: qty 1

## 2015-08-31 NOTE — Discharge Instructions (Signed)
You are constipated. Use miralax until you are less constipated. Follow up with your regular doctor about this.  Constipation Constipation is when a person:  Poops (has a bowel movement) less than 3 times a week.  Has a hard time pooping.  Has poop that is dry, hard, or bigger than normal. HOME CARE   Eat foods with a lot of fiber in them. This includes fruits, vegetables, beans, and whole grains such as brown rice.  Avoid fatty foods and foods with a lot of sugar. This includes french fries, hamburgers, cookies, candy, and soda.  If you are not getting enough fiber from food, take products with added fiber in them (supplements).  Drink enough fluid to keep your pee (urine) clear or pale yellow.  Exercise on a regular basis, or as told by your doctor.  Go to the restroom when you feel like you need to poop. Do not hold it.  Only take medicine as told by your doctor. Do not take medicines that help you poop (laxatives) without talking to your doctor first. GET HELP RIGHT AWAY IF:   You have bright red blood in your poop (stool).  Your constipation lasts more than 4 days or gets worse.  You have belly (abdominal) or butt (rectal) pain.  You have thin poop (as thin as a pencil).  You lose weight, and it cannot be explained. MAKE SURE YOU:   Understand these instructions.  Will watch your condition.  Will get help right away if you are not doing well or get worse. Document Released: 05/14/2008 Document Revised: 12/01/2013 Document Reviewed: 09/07/2013 [facility] Patient Information 2015 [place], [place]. This information is not intended to replace advice given to you by your health care provider. Make sure you discuss any questions you have with your health care provider.

## 2015-08-31 NOTE — ED Notes (Signed)
Pt given urinal  And made aware of need for urine specimen.

## 2015-08-31 NOTE — ED Notes (Signed)
Pt reports back surgery in July 2016, since then pt has been taking tramadol and muscle relaxer daily. Reports right sided abd pain x3 days, pain 7/10. Reports feel "bloated". Reports everything "he gets on that medicine for a long time it messes him up".  Last bowel movement yesterday, reports nausea. Denies diarrhea or vomiting. Denies dysuria. Denies blood in stool and urine.

## 2015-08-31 NOTE — ED Notes (Signed)
Pt is attempting to provide urine specimen. 
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
## 2015-09-23 ENCOUNTER — Ambulatory Visit: Payer: Medicare Other | Attending: Orthopedic Surgery | Admitting: Physical Therapy

## 2015-09-23 DIAGNOSIS — M5386 Other specified dorsopathies, lumbar region: Secondary | ICD-10-CM

## 2015-09-23 DIAGNOSIS — M2560 Stiffness of unspecified joint, not elsewhere classified: Secondary | ICD-10-CM | POA: Insufficient documentation

## 2015-09-23 DIAGNOSIS — M545 Low back pain: Secondary | ICD-10-CM | POA: Diagnosis present

## 2015-09-23 DIAGNOSIS — R293 Abnormal posture: Secondary | ICD-10-CM | POA: Diagnosis present

## 2015-09-23 DIAGNOSIS — M62830 Muscle spasm of back: Secondary | ICD-10-CM | POA: Diagnosis present

## 2015-09-23 NOTE — Patient Instructions (Signed)
   Wendie Diskin PT, DPT, LAT, ATC   Outpatient Rehabilitation Phone: [phone]     
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
## 2015-10-05 ENCOUNTER — Ambulatory Visit: Payer: Medicare Other | Admitting: Physical Therapy

## 2015-10-05 DIAGNOSIS — M62830 Muscle spasm of back: Secondary | ICD-10-CM

## 2015-10-05 DIAGNOSIS — M545 Low back pain, unspecified: Secondary | ICD-10-CM

## 2015-10-05 DIAGNOSIS — R293 Abnormal posture: Secondary | ICD-10-CM

## 2015-10-05 DIAGNOSIS — M5386 Other specified dorsopathies, lumbar region: Secondary | ICD-10-CM

## 2015-10-05 NOTE — Patient Instructions (Addendum)
Knee Fold    Lie on back, legs bent, arms by sides. Exhale, lifting knee to chest. Inhale, returning. Keep abdominals flat, navel to spine. Repeat _10___ times, alternating legs. Do __2__ sessions per day.  http://pm.exer.us/8   Copyright  VHI. All rights reserved.     Sleeping on Back  Place pillow under knees. A pillow with cervical support and a roll around waist are also helpful. Copyright  VHI. All rights reserved.  Sleeping on Side Place pillow between knees. Use cervical support under neck and a roll around waist as needed. Copyright  VHI. All rights reserved.   Sleeping on Stomach   If this is the only desirable sleeping position, place pillow under lower legs, and under stomach or chest as needed.  Posture - Sitting   Sit upright, head facing forward. Try using a roll to support lower back. Keep shoulders relaxed, and avoid rounded back. Keep hips level with knees. Avoid crossing legs for long periods. Stand to Sit / Sit to Stand   To sit: Bend knees to lower self onto front edge of chair, then scoot back on seat. To stand: Reverse sequence by placing one foot forward, and scoot to front of seat. Use rocking motion to stand up.   Work Height and Reach  Ideal work height is no more than 2 to 4 inches below elbow level when standing, and at elbow level when sitting. Reaching should be limited to arm's length, with elbows slightly bent.  Bending  Bend at hips and knees, not back. Keep feet shoulder-width apart.    Posture - Standing   Good posture is important. Avoid slouching and forward head thrust. Maintain curve in low back and align ears over shoul- ders, hips over ankles.  Alternating Positions   Alternate tasks and change positions frequently to reduce fatigue and muscle tension. Take rest breaks. Computer Work   Position work to Art gallery managerface forward. Use proper work and seat height. Keep shoulders back and down, wrists straight, and elbows at right  angles. Use chair that provides full back support. Add footrest and lumbar roll as needed.  Getting Into / Out of Car  Lower self onto seat, scoot back, then bring in one leg at a time. Reverse sequence to get out.  Dressing  Lie on back to pull socks or slacks over feet, or sit and bend leg while keeping back straight.    Housework - Sink  Place one foot on ledge of cabinet under sink when standing at sink for prolonged periods.   Pushing / Pulling  Pushing is preferable to pulling. Keep back in proper alignment, and use leg muscles to do the work.  Deep Squat   Squat and lift with both arms held against upper trunk. Tighten stomach muscles without holding breath. Use smooth movements to avoid jerking.  Avoid Twisting   Avoid twisting or bending back. Pivot around using foot movements, and bend at knees if needed when reaching for articles.  Carrying Luggage   Distribute weight evenly on both sides. Use a cart whenever possible. Do not twist trunk. Move body as a unit.   Lifting Principles .Maintain proper posture and head alignment. .Slide object as close as possible before lifting. .Move obstacles out of the way. .Test before lifting; ask for help if too heavy. .Tighten stomach muscles without holding breath. .Use smooth movements; do not jerk. .Use legs to do the work, and pivot with feet. .Distribute the work load symmetrically and close to the center  of trunk. .Push instead of pull whenever possible.   Ask For Help   Ask for help and delegate to others when possible. Coordinate your movements when lifting together, and maintain the low back curve.  Log Roll   Lying on back, bend left knee and place left arm across chest. Roll all in one movement to the right. Reverse to roll to the left. Always move as one unit. Housework - Sweeping  Use long-handled equipment to avoid stooping.   Housework - Wiping  Position yourself as close as possible to reach  work surface. Avoid straining your back.  Laundry - Unloading Wash   To unload small items at bottom of washer, lift leg opposite to arm being used to reach.  Gardening - Raking  Move close to area to be raked. Use arm movements to do the work. Keep back straight and avoid twisting.     Cart  When reaching into cart with one arm, lift opposite leg to keep back straight.   Getting Into / Out of Bed  Lower self to lie down on one side by raising legs and lowering head at the same time. Use arms to assist moving without twisting. Bend both knees to roll onto back if desired. To sit up, start from lying on side, and use same move-ments in reverse. Housework - Vacuuming  Hold the vacuum with arm held at side. Step back and forth to move it, keeping head up. Avoid twisting.   Laundry - Armed forces training and education officer so that bending and twisting can be avoided.   Laundry - Unloading Dryer  Squat down to reach into clothes dryer or use a reacher.  Gardening - Weeding / Psychiatric nurse or Kneel. Knee pads may be helpful.
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
## 2015-10-07 ENCOUNTER — Ambulatory Visit: Payer: Medicare Other | Admitting: Physical Therapy

## 2015-10-11 ENCOUNTER — Emergency Department (HOSPITAL_COMMUNITY): Payer: Medicare Other

## 2015-10-11 ENCOUNTER — Encounter (HOSPITAL_COMMUNITY): Payer: Self-pay | Admitting: *Deleted

## 2015-10-11 ENCOUNTER — Emergency Department (HOSPITAL_COMMUNITY)
Admission: EM | Admit: 2015-10-11 | Discharge: 2015-10-11 | Disposition: A | Discharge status: Home / Self Care | Payer: Medicare Other | Attending: Emergency Medicine | Admitting: Emergency Medicine

## 2015-10-11 ENCOUNTER — Ambulatory Visit: Payer: Medicare Other | Admitting: Physical Therapy

## 2015-10-11 DIAGNOSIS — M1990 Unspecified osteoarthritis, unspecified site: Secondary | ICD-10-CM | POA: Diagnosis not present

## 2015-10-11 DIAGNOSIS — R197 Diarrhea, unspecified: Secondary | ICD-10-CM | POA: Insufficient documentation

## 2015-10-11 DIAGNOSIS — Z7982 Long term (current) use of aspirin: Secondary | ICD-10-CM | POA: Diagnosis not present

## 2015-10-11 DIAGNOSIS — I10 Essential (primary) hypertension: Secondary | ICD-10-CM | POA: Insufficient documentation

## 2015-10-11 DIAGNOSIS — Z87438 Personal history of other diseases of male genital organs: Secondary | ICD-10-CM | POA: Diagnosis not present

## 2015-10-11 DIAGNOSIS — K219 Gastro-esophageal reflux disease without esophagitis: Secondary | ICD-10-CM | POA: Insufficient documentation

## 2015-10-11 DIAGNOSIS — R1011 Right upper quadrant pain: Secondary | ICD-10-CM | POA: Diagnosis not present

## 2015-10-11 DIAGNOSIS — R748 Abnormal levels of other serum enzymes: Secondary | ICD-10-CM

## 2015-10-11 DIAGNOSIS — Z720 Tobacco use: Secondary | ICD-10-CM | POA: Diagnosis not present

## 2015-10-11 DIAGNOSIS — Z79899 Other long term (current) drug therapy: Secondary | ICD-10-CM | POA: Diagnosis not present

## 2015-10-11 DIAGNOSIS — E119 Type 2 diabetes mellitus without complications: Secondary | ICD-10-CM | POA: Insufficient documentation

## 2015-10-11 DIAGNOSIS — R350 Frequency of micturition: Secondary | ICD-10-CM | POA: Insufficient documentation

## 2015-10-11 DIAGNOSIS — R1031 Right lower quadrant pain: Secondary | ICD-10-CM | POA: Insufficient documentation

## 2015-10-11 DIAGNOSIS — Z8619 Personal history of other infectious and parasitic diseases: Secondary | ICD-10-CM | POA: Diagnosis not present

## 2015-10-11 DIAGNOSIS — R110 Nausea: Secondary | ICD-10-CM | POA: Diagnosis not present

## 2015-10-11 DIAGNOSIS — R300 Dysuria: Secondary | ICD-10-CM | POA: Diagnosis not present

## 2015-10-11 DIAGNOSIS — R1084 Generalized abdominal pain: Secondary | ICD-10-CM | POA: Diagnosis not present

## 2015-10-11 DIAGNOSIS — Z7984 Long term (current) use of oral hypoglycemic drugs: Secondary | ICD-10-CM | POA: Diagnosis not present

## 2015-10-11 LAB — URINALYSIS, ROUTINE W REFLEX MICROSCOPIC
Bilirubin Urine: NEGATIVE
Glucose, UA: NEGATIVE mg/dL
Hgb urine dipstick: NEGATIVE
KETONES UR: NEGATIVE mg/dL
LEUKOCYTES UA: NEGATIVE
Nitrite: NEGATIVE
PH: 7 (ref 5.0–8.0)
PROTEIN: NEGATIVE mg/dL
Specific Gravity, Urine: 1.007 (ref 1.005–1.030)
Urobilinogen, UA: 0.2 mg/dL (ref 0.0–1.0)

## 2015-10-11 LAB — COMPREHENSIVE METABOLIC PANEL
ALT: 15 U/L — ABNORMAL LOW (ref 17–63)
ANION GAP: 7 (ref 5–15)
AST: 16 U/L (ref 15–41)
Albumin: 4.2 g/dL (ref 3.5–5.0)
Alkaline Phosphatase: 63 U/L (ref 38–126)
BUN: 8 mg/dL (ref 6–20)
CHLORIDE: 103 mmol/L (ref 101–111)
CO2: 28 mmol/L (ref 22–32)
CREATININE: 0.87 mg/dL (ref 0.61–1.24)
Calcium: 9.8 mg/dL (ref 8.9–10.3)
GFR calc Af Amer: >60 mL/min (ref >60)
GFR calc non Af Amer: >60 mL/min (ref >60)
Glucose, Bld: 154 mg/dL — ABNORMAL HIGH (ref 65–99)
POTASSIUM: 4 mmol/L (ref 3.5–5.1)
SODIUM: 138 mmol/L (ref 135–145)
Total Bilirubin: 0.4 mg/dL (ref 0.3–1.2)
Total Protein: 7.9 g/dL (ref 6.5–8.1)

## 2015-10-11 LAB — CBC
HCT: 44.2 % (ref 39.0–52.0)
Hemoglobin: 15.2 g/dL (ref 13.0–17.0)
MCH: 27.9 pg (ref 26.0–34.0)
MCHC: 34.4 g/dL (ref 30.0–36.0)
MCV: 81.1 fL (ref 78.0–100.0)
Platelets: 305 10*3/uL (ref 150–400)
RBC: 5.45 MIL/uL (ref 4.22–5.81)
RDW: 15.8 % — ABNORMAL HIGH (ref 11.5–15.5)
WBC: 4.8 10*3/uL (ref 4.0–10.5)

## 2015-10-11 LAB — LIPASE, BLOOD: LIPASE: 173 U/L — AB (ref 11–51)

## 2015-10-11 MED ORDER — CEPHALEXIN 500 MG PO CAPS: 500.0000 mg | ORAL_CAPSULE | Freq: Three times a day (TID) | ORAL | Status: DC

## 2015-10-11 MED ORDER — SODIUM CHLORIDE 0.9 % IV BOLUS (SEPSIS)
1000.0000 mL | Freq: Once | INTRAVENOUS | Status: AC
  Administered 2015-10-11: 1000 mL via INTRAVENOUS

## 2015-10-11 MED ORDER — ONDANSETRON HCL 4 MG/2ML IJ SOLN
4.0000 mg | Freq: Once | INTRAMUSCULAR | Status: AC
  Administered 2015-10-11: 4 mg via INTRAVENOUS
  Filled 2015-10-11: qty 2

## 2015-10-11 MED ORDER — FENTANYL CITRATE (PF) 100 MCG/2ML IJ SOLN
50.0000 ug | Freq: Once | INTRAMUSCULAR | Status: AC
  Administered 2015-10-11: 50 ug via INTRAVENOUS
  Filled 2015-10-11: qty 2

## 2015-10-11 MED ORDER — DICYCLOMINE HCL 20 MG PO TABS: 20.0000 mg | ORAL_TABLET | Freq: Two times a day (BID) | ORAL | Status: DC | PRN

## 2015-10-11 MED ORDER — ONDANSETRON 4 MG PO TBDP: 4.0000 mg | ORAL_TABLET | Freq: Three times a day (TID) | ORAL | Status: DC | PRN

## 2015-10-11 MED ORDER — HYDROCODONE-ACETAMINOPHEN 5-325 MG PO TABS: 2.0000 | ORAL_TABLET | ORAL | Status: DC | PRN

## 2015-10-11 NOTE — ED Notes (Addendum)
Pt reports abd pain, dysuria, diarrhea, and nausea since yesterday. Pain 9/10. Denies vomiting.

## 2015-10-11 NOTE — ED Notes (Signed)
Patient tolerated crackers and ginger ale  

## 2015-10-11 NOTE — ED Notes (Signed)
Patient transported to X-ray 
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
## 2015-10-11 NOTE — Discharge Instructions (Signed)
Abdominal Pain, Adult °Many things can cause abdominal pain. Usually, abdominal pain is not caused by a disease and will improve without treatment. It can often be observed and treated at home. Your health care provider will do a physical exam and possibly order blood tests and X-rays to help determine the seriousness of your pain. However, in many cases, more time must pass before a clear cause of the pain can be found. Before that point, your health care provider may not know if you need more testing or further treatment. °HOME CARE INSTRUCTIONS °Monitor your abdominal pain for any changes. The following actions may help to alleviate any discomfort you are experiencing: °· Only take over-the-counter or prescription medicines as directed by your health care provider. °· Do not take laxatives unless directed to do so by your health care provider. °· Try a clear liquid diet (broth, tea, or water) as directed by your health care provider. Slowly move to a bland diet as tolerated. °SEEK MEDICAL CARE IF: °· You have unexplained abdominal pain. °· You have abdominal pain associated with nausea or diarrhea. °· You have pain when you urinate or have a bowel movement. °· You experience abdominal pain that wakes you in the night. °· You have abdominal pain that is worsened or improved by eating food. °· You have abdominal pain that is worsened with eating fatty foods. °· You have a fever. °SEEK IMMEDIATE MEDICAL CARE IF: °· Your pain does not go away within 2 hours. °· You keep throwing up (vomiting). °· Your pain is felt only in portions of the abdomen, such as the right side or the left lower portion of the abdomen. °· You pass bloody or black tarry stools. °MAKE SURE YOU: °· Understand these instructions. °· Will watch your condition. °· Will get help right away if you are not doing well or get worse. °  °This information is not intended to replace advice given to you by your health care provider. Make sure you discuss  any questions you have with your health care provider. °  °Document Released: 09/05/2005 Document Revised: 08/17/2015 Document Reviewed: 08/05/2013 °Elsevier Interactive Patient Education ©2016 Elsevier Inc. ° °Dysuria °Dysuria is pain or discomfort while urinating. The pain or discomfort may be felt in the tube that carries urine out of the bladder (urethra) or in the surrounding tissue of the genitals. The pain may also be felt in the groin area, lower abdomen, and lower back. You may have to urinate frequently or have the sudden feeling that you have to urinate (urgency). Dysuria can affect both men and women, but is more common in women. °Dysuria can be caused by many different things, including: °· Urinary tract infection in women. °· Infection of the kidney or bladder. °· Kidney stones or bladder stones. °· Certain sexually transmitted infections (STIs), such as chlamydia. °· Dehydration. °· Inflammation of the vagina. °· Use of certain medicines. °· Use of certain soaps or scented products that cause irritation. °HOME CARE INSTRUCTIONS °Watch your dysuria for any changes. The following actions may help to reduce any discomfort you are feeling: °· Drink enough fluid to keep your urine clear or pale yellow. °· Empty your bladder often. Avoid holding urine for long periods of time. °· After a bowel movement or urination, women should cleanse from front to back, using each tissue only once. °· Empty your bladder after sexual intercourse. °· Take medicines only as directed by your health care provider. °· If you were prescribed an   antibiotic medicine, finish it all even if you start to feel better.  Avoid caffeine, tea, and alcohol. They can irritate the bladder and make dysuria worse. In men, alcohol may irritate the prostate.  Keep all follow-up visits as directed by your health care provider. This is important.  If you had any tests done to find the cause of dysuria, it is your responsibility to obtain  your test results. Ask the lab or department performing the test when and how you will get your results. Talk with your health care provider if you have any questions about your results. SEEK MEDICAL CARE IF:  You develop pain in your back or sides.  You have a fever.  You have nausea or vomiting.  You have blood in your urine.  You are not urinating as often as you usually do. SEEK IMMEDIATE MEDICAL CARE IF:  You pain is severe and not relieved with medicines.  You are unable to hold down any fluids.  You or someone else notices a change in your mental function.  You have a rapid heartbeat at rest.  You have shaking or chills.  You feel extremely weak.   This information is not intended to replace advice given to you by your health care provider. Make sure you discuss any questions you have with your health care provider.   Document Released: 08/24/2004 Document Revised: 12/17/2014 Document Reviewed: 07/22/2014 Elsevier Interactive Patient Education 2016 Elsevier Inc.  Irritable Bowel Syndrome, Adult Irritable bowel syndrome (IBS) is not one specific disease. It is a group of symptoms that affects the organs responsible for digestion (gastrointestinal or GI tract).  To regulate how your GI tract works, your body sends signals back and forth between your intestines and your brain. If you have IBS, there may be a problem with these signals. As a result, your GI tract does not function normally. Your intestines may become more sensitive and overreact to certain things. This is especially true when you eat certain foods or when you are under stress.  There are four types of IBS. These may be determined based on the consistency of your stool:   IBS with diarrhea.   IBS with constipation.   Mixed IBS.   Unsubtyped IBS.  It is important to know which type of IBS you have. Some treatments are more likely to be helpful for certain types of IBS.  CAUSES  The exact cause of  IBS is not known. RISK FACTORS You may have a higher risk of IBS if:  You are a woman.  You are younger than 70 years old.  You have a family history of IBS.  You have mental health problems.  You have had bacterial infection of your GI tract. SIGNS AND SYMPTOMS  Symptoms of IBS vary from person to person. The main symptom is abdominal pain or discomfort. Additional symptoms usually include one or more of the following:   Diarrhea, constipation, or both.   Abdominal swelling or bloating.   Feeling full or sick after eating a small or regular-size meal.   Frequent gas.   Mucus in the stool.   A feeling of having more stool left after a bowel movement.  Symptoms tend to come and go. They may be associated with stress, psychiatric conditions, or nothing at all.  DIAGNOSIS  There is no specific test to diagnose IBS. Your health care provider will make a diagnosis based on a physical exam, medical history, and your symptoms. You may have other tests  to rule out other conditions that may be causing your symptoms. These may include:   Blood tests.   X-rays.   CT scan.  Endoscopy and colonoscopy. This is a test in which your GI tract is viewed with a long, thin, flexible tube. TREATMENT There is no cure for IBS, but treatment can help relieve symptoms. IBS treatment often includes:   Changes to your diet, such as:  Eating more fiber.  Avoiding foods that cause symptoms.  Drinking more water.  Eating regular, medium-sized portioned meals.  Medicines. These may include:  Fiber supplements if you have constipation.  Medicine to control diarrhea (antidiarrheal medicines).  Medicine to help control muscle spasms in your GI tract (antispasmodic medicines).  Medicines to help with any mental health issues, such as antidepressants or tranquilizers.  Therapy.  Talk therapy may help with anxiety, depression, or other mental health issues that can make IBS symptoms  worse.  Stress reduction.  Managing your stress can help keep symptoms under control. HOME CARE INSTRUCTIONS   Take medicines only as directed by your health care provider.  Eat a healthy diet.  Avoid foods and drinks with added sugar.  Include more whole grains, fruits, and vegetables gradually into your diet. This may be especially helpful if you have IBS with constipation.  Avoid any foods and drinks that make your symptoms worse. These may include dairy products and caffeinated or carbonated drinks.  Do not eat large meals.  Drink enough fluid to keep your urine clear or pale yellow.  Exercise regularly. Ask your health care provider for recommendations of good activities for you.  Keep all follow-up visits as directed by your health care provider. This is important. SEEK MEDICAL CARE IF:   You have constant pain.  You have trouble or pain with swallowing.  You have worsening diarrhea. SEEK IMMEDIATE MEDICAL CARE IF:   You have severe and worsening abdominal pain.   You have diarrhea and:   You have a rash, stiff neck, or severe headache.   You are irritable, sleepy, or difficult to awaken.   You are weak, dizzy, or extremely thirsty.   You have bright red blood in your stool or you have black tarry stools.   You have unusual abdominal swelling that is painful.   You vomit continuously.   You vomit blood (hematemesis).   You have both abdominal pain and a fever.    This information is not intended to replace advice given to you by your health care provider. Make sure you discuss any questions you have with your health care provider.   Document Released: 11/26/2005 Document Revised: 12/17/2014 Document Reviewed: 08/13/2014 Elsevier Interactive Patient Education [employer]

## 2015-10-12 LAB — URINE CULTURE: Special Requests: NORMAL

## 2015-10-13 ENCOUNTER — Ambulatory Visit: Payer: Medicare Other | Attending: Orthopedic Surgery | Admitting: Physical Therapy

## 2015-10-13 DIAGNOSIS — M2560 Stiffness of unspecified joint, not elsewhere classified: Secondary | ICD-10-CM | POA: Diagnosis present

## 2015-10-13 DIAGNOSIS — M545 Low back pain, unspecified: Secondary | ICD-10-CM

## 2015-10-13 DIAGNOSIS — R293 Abnormal posture: Secondary | ICD-10-CM | POA: Insufficient documentation

## 2015-10-13 DIAGNOSIS — M62830 Muscle spasm of back: Secondary | ICD-10-CM | POA: Diagnosis present

## 2015-10-13 DIAGNOSIS — M5386 Other specified dorsopathies, lumbar region: Secondary | ICD-10-CM
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
## 2015-10-17 ENCOUNTER — Other Ambulatory Visit: Payer: Self-pay | Admitting: *Deleted

## 2015-10-17 DIAGNOSIS — K219 Gastro-esophageal reflux disease without esophagitis: Secondary | ICD-10-CM

## 2015-10-17 MED ORDER — ESOMEPRAZOLE MAGNESIUM 20 MG PO CPDR: 20.0000 mg | DELAYED_RELEASE_CAPSULE | Freq: Every day | ORAL | Status: DC

## 2015-10-18 ENCOUNTER — Other Ambulatory Visit: Payer: Self-pay | Admitting: Gastroenterology

## 2015-10-18 ENCOUNTER — Ambulatory Visit: Payer: Medicare Other | Admitting: Physical Therapy

## 2015-10-18 DIAGNOSIS — M545 Low back pain, unspecified: Secondary | ICD-10-CM

## 2015-10-18 DIAGNOSIS — M5386 Other specified dorsopathies, lumbar region: Secondary | ICD-10-CM

## 2015-10-18 DIAGNOSIS — M62830 Muscle spasm of back: Secondary | ICD-10-CM | POA: Diagnosis not present

## 2015-10-18 DIAGNOSIS — R293 Abnormal posture: Secondary | ICD-10-CM

## 2015-10-18 NOTE — Patient Instructions (Signed)
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
## 2015-10-20 ENCOUNTER — Ambulatory Visit: Payer: Medicare Other | Admitting: Physical Therapy

## 2015-10-20 ENCOUNTER — Other Ambulatory Visit: Payer: Self-pay | Admitting: Gastroenterology

## 2015-10-20 DIAGNOSIS — R1011 Right upper quadrant pain: Secondary | ICD-10-CM

## 2015-10-20 DIAGNOSIS — M545 Low back pain, unspecified: Secondary | ICD-10-CM

## 2015-10-20 DIAGNOSIS — M62830 Muscle spasm of back: Secondary | ICD-10-CM | POA: Diagnosis not present

## 2015-10-20 DIAGNOSIS — R748 Abnormal levels of other serum enzymes: Secondary | ICD-10-CM

## 2015-10-20 DIAGNOSIS — R293 Abnormal posture: Secondary | ICD-10-CM

## 2015-10-20 DIAGNOSIS — M5386 Other specified dorsopathies, lumbar region: Secondary | ICD-10-CM
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
## 2015-10-25 ENCOUNTER — Ambulatory Visit: Payer: Medicare Other | Admitting: Physical Therapy

## 2015-10-25 DIAGNOSIS — M62830 Muscle spasm of back: Secondary | ICD-10-CM

## 2015-10-25 DIAGNOSIS — M545 Low back pain, unspecified: Secondary | ICD-10-CM

## 2015-10-25 DIAGNOSIS — M5386 Other specified dorsopathies, lumbar region: Secondary | ICD-10-CM

## 2015-10-25 DIAGNOSIS — R293 Abnormal posture: Secondary | ICD-10-CM

## 2015-10-26 ENCOUNTER — Encounter: Payer: Medicare Other | Admitting: Physical Therapy
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
## 2015-10-27 ENCOUNTER — Ambulatory Visit: Payer: Medicare Other | Admitting: Physical Therapy

## 2015-10-27 ENCOUNTER — Ambulatory Visit
Admission: RE | Admit: 2015-10-27 | Discharge: 2015-10-27 | Disposition: A | Discharge status: Home / Self Care | Payer: Medicare Other | Source: Ambulatory Visit | Attending: Gastroenterology | Admitting: Gastroenterology

## 2015-10-27 DIAGNOSIS — R748 Abnormal levels of other serum enzymes: Secondary | ICD-10-CM

## 2015-10-27 DIAGNOSIS — R1011 Right upper quadrant pain: Secondary | ICD-10-CM

## 2015-10-27 MED ORDER — IOPAMIDOL (ISOVUE-300) INJECTION 61%
125.0000 mL | Freq: Once | INTRAVENOUS | Status: AC | PRN
  Administered 2015-10-27: 125 mL via INTRAVENOUS

## 2015-10-28 ENCOUNTER — Ambulatory Visit (HOSPITAL_COMMUNITY): Admission: RE | Admit: 2015-10-28 | Payer: Medicare Other | Source: Ambulatory Visit | Admitting: Gastroenterology

## 2015-10-28 ENCOUNTER — Encounter: Payer: Medicare Other | Admitting: Physical Therapy

## 2015-10-28 ENCOUNTER — Encounter (HOSPITAL_COMMUNITY): Admission: RE | Payer: Self-pay | Source: Ambulatory Visit

## 2015-10-28 SURGERY — ULTRASOUND, UPPER GI TRACT, ENDOSCOPIC
Anesthesia: Monitor Anesthesia Care

## 2015-10-31 ENCOUNTER — Ambulatory Visit: Payer: Medicare Other | Admitting: Physical Therapy

## 2015-10-31 DIAGNOSIS — M545 Low back pain, unspecified: Secondary | ICD-10-CM

## 2015-10-31 DIAGNOSIS — M5386 Other specified dorsopathies, lumbar region: Secondary | ICD-10-CM

## 2015-10-31 DIAGNOSIS — M62830 Muscle spasm of back: Secondary | ICD-10-CM

## 2015-10-31 DIAGNOSIS — R293 Abnormal posture: Secondary | ICD-10-CM
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
## 2015-11-01 ENCOUNTER — Encounter: Payer: Medicare Other | Admitting: Physical Therapy

## 2015-11-01 ENCOUNTER — Ambulatory Visit: Payer: Medicare Other | Admitting: Physical Therapy

## 2015-11-01 DIAGNOSIS — M545 Low back pain, unspecified: Secondary | ICD-10-CM

## 2015-11-01 DIAGNOSIS — M62830 Muscle spasm of back: Secondary | ICD-10-CM

## 2015-11-01 DIAGNOSIS — R293 Abnormal posture: Secondary | ICD-10-CM

## 2015-11-01 DIAGNOSIS — M5386 Other specified dorsopathies, lumbar region: Secondary | ICD-10-CM
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
## 2015-11-07 ENCOUNTER — Encounter: Payer: Medicare Other | Admitting: Physical Therapy

## 2015-11-07 ENCOUNTER — Ambulatory Visit: Payer: Medicare Other | Admitting: Physical Therapy

## 2015-11-07 DIAGNOSIS — R293 Abnormal posture: Secondary | ICD-10-CM

## 2015-11-07 DIAGNOSIS — M545 Low back pain, unspecified: Secondary | ICD-10-CM

## 2015-11-07 DIAGNOSIS — M62830 Muscle spasm of back: Secondary | ICD-10-CM

## 2015-11-07 DIAGNOSIS — M5386 Other specified dorsopathies, lumbar region: Secondary | ICD-10-CM
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
## 2015-11-08 ENCOUNTER — Emergency Department (HOSPITAL_COMMUNITY)
Admission: EM | Admit: 2015-11-08 | Discharge: 2015-11-08 | Disposition: A | Discharge status: Home / Self Care | Payer: Medicare Other | Attending: Physician Assistant | Admitting: Physician Assistant

## 2015-11-08 ENCOUNTER — Encounter (HOSPITAL_COMMUNITY): Payer: Self-pay | Admitting: Emergency Medicine

## 2015-11-08 DIAGNOSIS — Z87828 Personal history of other (healed) physical injury and trauma: Secondary | ICD-10-CM | POA: Insufficient documentation

## 2015-11-08 DIAGNOSIS — M1990 Unspecified osteoarthritis, unspecified site: Secondary | ICD-10-CM | POA: Insufficient documentation

## 2015-11-08 DIAGNOSIS — K219 Gastro-esophageal reflux disease without esophagitis: Secondary | ICD-10-CM | POA: Insufficient documentation

## 2015-11-08 DIAGNOSIS — R140 Abdominal distension (gaseous): Secondary | ICD-10-CM | POA: Diagnosis not present

## 2015-11-08 DIAGNOSIS — Z7982 Long term (current) use of aspirin: Secondary | ICD-10-CM | POA: Diagnosis not present

## 2015-11-08 DIAGNOSIS — I10 Essential (primary) hypertension: Secondary | ICD-10-CM | POA: Diagnosis not present

## 2015-11-08 DIAGNOSIS — Z7984 Long term (current) use of oral hypoglycemic drugs: Secondary | ICD-10-CM | POA: Insufficient documentation

## 2015-11-08 DIAGNOSIS — Z79899 Other long term (current) drug therapy: Secondary | ICD-10-CM | POA: Insufficient documentation

## 2015-11-08 DIAGNOSIS — Z8619 Personal history of other infectious and parasitic diseases: Secondary | ICD-10-CM | POA: Diagnosis not present

## 2015-11-08 DIAGNOSIS — E119 Type 2 diabetes mellitus without complications: Secondary | ICD-10-CM | POA: Diagnosis not present

## 2015-11-08 DIAGNOSIS — R109 Unspecified abdominal pain: Secondary | ICD-10-CM | POA: Diagnosis present

## 2015-11-08 DIAGNOSIS — F17210 Nicotine dependence, cigarettes, uncomplicated: Secondary | ICD-10-CM | POA: Diagnosis not present

## 2015-11-08 LAB — COMPREHENSIVE METABOLIC PANEL
ALK PHOS: 64 U/L (ref 38–126)
ALT: 14 U/L — AB (ref 17–63)
AST: 19 U/L (ref 15–41)
Albumin: 3.9 g/dL (ref 3.5–5.0)
Anion gap: 8 (ref 5–15)
BUN: 6 mg/dL (ref 6–20)
CALCIUM: 9.4 mg/dL (ref 8.9–10.3)
CHLORIDE: 98 mmol/L — AB (ref 101–111)
CO2: 31 mmol/L (ref 22–32)
CREATININE: 1.12 mg/dL (ref 0.61–1.24)
Glucose, Bld: 168 mg/dL — ABNORMAL HIGH (ref 65–99)
Potassium: 4 mmol/L (ref 3.5–5.1)
SODIUM: 137 mmol/L (ref 135–145)
Total Bilirubin: 0.7 mg/dL (ref 0.3–1.2)
Total Protein: 7.3 g/dL (ref 6.5–8.1)

## 2015-11-08 LAB — CBC WITH DIFFERENTIAL/PLATELET
BASOS PCT: 0 %
Basophils Absolute: 0 10*3/uL (ref 0.0–0.1)
EOS ABS: 0.3 10*3/uL (ref 0.0–0.7)
EOS PCT: 7 %
HCT: 44 % (ref 39.0–52.0)
Hemoglobin: 14.9 g/dL (ref 13.0–17.0)
LYMPHS ABS: 1.8 10*3/uL (ref 0.7–4.0)
Lymphocytes Relative: 36 %
MCH: 27.2 pg (ref 26.0–34.0)
MCHC: 33.9 g/dL (ref 30.0–36.0)
MCV: 80.4 fL (ref 78.0–100.0)
MONOS PCT: 10 %
Monocytes Absolute: 0.5 10*3/uL (ref 0.1–1.0)
Neutro Abs: 2.4 10*3/uL (ref 1.7–7.7)
Neutrophils Relative %: 47 %
PLATELETS: 261 10*3/uL (ref 150–400)
RBC: 5.47 MIL/uL (ref 4.22–5.81)
RDW: 14.7 % (ref 11.5–15.5)
WBC: 5.1 10*3/uL (ref 4.0–10.5)

## 2015-11-08 LAB — URINALYSIS, ROUTINE W REFLEX MICROSCOPIC
GLUCOSE, UA: NEGATIVE mg/dL
HGB URINE DIPSTICK: NEGATIVE
Ketones, ur: 15 mg/dL — AB
Leukocytes, UA: NEGATIVE
Nitrite: NEGATIVE
Protein, ur: NEGATIVE mg/dL
SPECIFIC GRAVITY, URINE: 1.027 (ref 1.005–1.030)
pH: 6 (ref 5.0–8.0)

## 2015-11-08 LAB — LIPASE, BLOOD: Lipase: 64 U/L — ABNORMAL HIGH (ref 11–51)

## 2015-11-08 MED ORDER — SIMETHICONE 125 MG PO CAPS: 1.0000 | ORAL_CAPSULE | Freq: Three times a day (TID) | ORAL | Status: DC | PRN

## 2015-11-08 MED ORDER — GI COCKTAIL ~~LOC~~
30.0000 mL | Freq: Once | ORAL | Status: AC
  Administered 2015-11-08: 30 mL via ORAL
  Filled 2015-11-08: qty 30

## 2015-11-08 MED ORDER — DICYCLOMINE HCL 10 MG PO CAPS
10.0000 mg | ORAL_CAPSULE | Freq: Once | ORAL | Status: AC
  Administered 2015-11-08: 10 mg via ORAL
  Filled 2015-11-08: qty 1
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
## 2015-11-08 NOTE — ED Notes (Signed)
MD at bedside. 

## 2015-11-08 NOTE — Discharge Instructions (Signed)
We are unsure what is causing your chronic abdominal pain. Please follow-up with Dr. Elnoria HowardHung, your gastroenterologist for further workup. Here your labs and urine are normal. You do not want a and CT at this time. If you would like to come back for CT, you're welcome to return . Abdominal Pain, Adult Many things can cause abdominal pain. Usually, abdominal pain is not caused by a disease and will improve without treatment. It can often be observed and treated at home. Your health care provider will do a physical exam and possibly order blood tests and X-rays to help determine the seriousness of your pain. However, in many cases, more time must pass before a clear cause of the pain can be found. Before that point, your health care provider may not know if you need more testing or further treatment. HOME CARE INSTRUCTIONS Monitor your abdominal pain for any changes. The following actions may help to alleviate any discomfort you are experiencing:  Only take over-the-counter or prescription medicines as directed by your health care provider.  Do not take laxatives unless directed to do so by your health care provider.  Try a clear liquid diet (broth, tea, or water) as directed by your health care provider. Slowly move to a bland diet as tolerated. SEEK MEDICAL CARE IF:  You have unexplained abdominal pain.  You have abdominal pain associated with nausea or diarrhea.  You have pain when you urinate or have a bowel movement.  You experience abdominal pain that wakes you in the night.  You have abdominal pain that is worsened or improved by eating food.  You have abdominal pain that is worsened with eating fatty foods.  You have a fever. SEEK IMMEDIATE MEDICAL CARE IF:  Your pain does not go away within 2 hours.  You keep throwing up (vomiting).  Your pain is felt only in portions of the abdomen, such as the right side or the left lower portion of the abdomen.  You pass bloody or black tarry  stools. MAKE SURE YOU:  Understand these instructions.  Will watch your condition.  Will get help right away if you are not doing well or get worse.   This information is not intended to replace advice given to you by your health care provider. Make sure you discuss any questions you have with your health care provider.   Document Released: 09/05/2005 Document Revised: 08/17/2015 Document Reviewed: 08/05/2013 Elsevier Interactive Patient Education [employer]2016 Elsevier Inc.

## 2015-11-08 NOTE — ED Notes (Signed)
Pt reports right side abdominal pain x6 months, reports he has rotating diarrhea and constipation. Reports a bowel movement yesterday that was normal. Pt reports multiple visits and tests for the same pain with no diagnosis.

## 2015-11-09 ENCOUNTER — Encounter: Payer: Medicare Other | Admitting: Physical Therapy

## 2015-11-09 ENCOUNTER — Ambulatory Visit: Payer: Medicare Other | Admitting: Physical Therapy

## 2015-11-09 DIAGNOSIS — M545 Low back pain, unspecified: Secondary | ICD-10-CM

## 2015-11-09 DIAGNOSIS — M62830 Muscle spasm of back: Secondary | ICD-10-CM | POA: Diagnosis not present

## 2015-11-09 DIAGNOSIS — R293 Abnormal posture: Secondary | ICD-10-CM

## 2015-11-09 DIAGNOSIS — M5386 Other specified dorsopathies, lumbar region: Secondary | ICD-10-CM

## 2015-11-09 LAB — URINE CULTURE: Culture: 3000

## 2015-11-09 NOTE — Patient Instructions (Signed)
Decompression exercises from exercise drawer
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
## 2015-11-11 ENCOUNTER — Ambulatory Visit (INDEPENDENT_AMBULATORY_CARE_PROVIDER_SITE_OTHER): Payer: Medicare Other | Admitting: Family Medicine

## 2015-11-11 ENCOUNTER — Encounter: Payer: Self-pay | Admitting: Family Medicine

## 2015-11-11 VITALS — BP 166/78 | HR 97 | Temp 98.0°F | Ht 73.0 in | Wt 228.6 lb

## 2015-11-11 DIAGNOSIS — E785 Hyperlipidemia, unspecified: Secondary | ICD-10-CM | POA: Diagnosis not present

## 2015-11-11 DIAGNOSIS — Z23 Encounter for immunization: Secondary | ICD-10-CM

## 2015-11-11 DIAGNOSIS — Z1159 Encounter for screening for other viral diseases: Secondary | ICD-10-CM

## 2015-11-11 DIAGNOSIS — R1031 Right lower quadrant pain: Secondary | ICD-10-CM

## 2015-11-11 DIAGNOSIS — K219 Gastro-esophageal reflux disease without esophagitis: Secondary | ICD-10-CM

## 2015-11-11 DIAGNOSIS — N529 Male erectile dysfunction, unspecified: Secondary | ICD-10-CM

## 2015-11-11 DIAGNOSIS — Z125 Encounter for screening for malignant neoplasm of prostate: Secondary | ICD-10-CM

## 2015-11-11 DIAGNOSIS — E119 Type 2 diabetes mellitus without complications: Secondary | ICD-10-CM | POA: Diagnosis not present

## 2015-11-11 DIAGNOSIS — I10 Essential (primary) hypertension: Secondary | ICD-10-CM

## 2015-11-11 LAB — LIPID PANEL
CHOLESTEROL: 147 mg/dL (ref 125–200)
HDL: 28 mg/dL — ABNORMAL LOW (ref >=40)
LDL CALC: 81 mg/dL (ref <130)
Total CHOL/HDL Ratio: 5.3 Ratio — ABNORMAL HIGH (ref <=5.0)
Triglycerides: 192 mg/dL — ABNORMAL HIGH (ref <150)
VLDL: 38 mg/dL — AB (ref <30)

## 2015-11-11 LAB — POCT GLYCOSYLATED HEMOGLOBIN (HGB A1C): Hemoglobin A1C: 7.4

## 2015-11-11 MED ORDER — PNEUMOCOCCAL VAC POLYVALENT 25 MCG/0.5ML IJ INJ: 0.5000 mL | INJECTION | INTRAMUSCULAR | Status: DC

## 2015-11-11 MED ORDER — SENNA-DOCUSATE SODIUM 8.6-50 MG PO TABS: 1.0000 | ORAL_TABLET | Freq: Every day | ORAL | Status: DC

## 2015-11-11 MED ORDER — TADALAFIL 5 MG PO TABS: 5.0000 mg | ORAL_TABLET | Freq: Every day | ORAL | Status: DC

## 2015-11-11 MED ORDER — POLYETHYLENE GLYCOL 3350 17 G PO PACK: 17.0000 g | PACK | Freq: Every day | ORAL | Status: DC

## 2015-11-11 MED ORDER — ATORVASTATIN CALCIUM 40 MG PO TABS: 40.0000 mg | ORAL_TABLET | Freq: Every day | ORAL | Status: DC

## 2015-11-11 MED ORDER — AMLODIPINE BESYLATE 10 MG PO TABS: 5.0000 mg | ORAL_TABLET | Freq: Every day | ORAL | Status: DC
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
## 2015-11-11 NOTE — Patient Instructions (Addendum)
Thank you for coming to see me today. It was a pleasure. Today we talked about:   I have started you on amlodipine for your blood pressure. Please take 1/2 tab daily.  I have started you on Cialis for your BPH and erectile dysfunction  I have started you on Miralax and Senokot for your constipation  Please make an appointment to see me in 3 months for follow-up.  If you have any questions or concerns, please do not hesitate to call the office at [phone].  Sincerely,  Jacquelin Hawkingalph Doha Boling, MD  Tadalafil tablets (Cialis) What is this medicine? TADALAFIL (tah DA la fil) is used to treat erection problems in men. It is also used for enlargement of the prostate gland in men, a condition called benign prostatic hyperplasia or BPH. This medicine improves urine flow and reduces BPH symptoms. This medicine can also treat both erection problems and BPH when they occur together. This medicine may be used for other purposes; ask your health care provider or pharmacist if you have questions. What should I tell my health care provider before I take this medicine? They need to know if you have any of these conditions: -bleeding disorders -eye or vision problems, including a rare inherited eye disease called retinitis pigmentosa -anatomical deformation of the penis, Peyronie's disease, or history of priapism (painful and prolonged erection) -heart disease, angina, a history of heart attack, irregular heart beats, or other heart problems -high or low blood pressure -history of blood diseases, like sickle cell anemia or leukemia -history of stomach bleeding -kidney disease -liver disease -stroke -an unusual or allergic reaction to tadalafil, other medicines, foods, dyes, or preservatives -pregnant or trying to get pregnant -breast-feeding How should I use this medicine? Take this medicine by mouth with a glass of water. Follow the directions on the prescription label. You may take this medicine with  or without meals. When this medicine is used for erection problems, your doctor may prescribe it to be taken once daily or as needed. If you are taking the medicine as needed, you may be able to have sexual activity 30 minutes after taking it and for up to 36 hours after taking it. Whether you are taking the medicine as needed or once daily, you should not take more than one dose per day. If you are taking this medicine for symptoms of benign prostatic hyperplasia (BPH) or to treat both BPH and an erection problem, take the dose once daily at about the same time each day. Do not take your medicine more often than directed. Talk to your pediatrician regarding the use of this medicine in children. Special care may be needed. Overdosage: If you think you have taken too much of this medicine contact a poison control center or emergency room at once. NOTE: This medicine is only for you. Do not share this medicine with others. What if I miss a dose? If you are taking this medicine as needed for erection problems, this does not apply. If you miss a dose while taking this medicine once daily for an erection problem, benign prostatic hyperplasia, or both, take it as soon as you remember, but do not take more than one dose per day. What may interact with this medicine? Do not take this medicine with any of the following medications: -nitrates like amyl nitrite, isosorbide dinitrate, isosorbide mononitrate, nitroglycerin -other medicines for erectile dysfunction like avanafil, sildenafil, vardenafil -other tadalafil products (Adcirca) -riociguat This medicine may also interact with the following medications: -  certain drugs for high blood pressure -certain drugs for the treatment of HIV infection or AIDS -certain drugs used for fungal or yeast infections, like fluconazole, itraconazole, ketoconazole, and voriconazole -certain drugs used for seizures like carbamazepine, phenytoin, and phenobarbital -grapefruit  juice -macrolide antibiotics like clarithromycin, erythromycin, troleandomycin -medicines for prostate problems -rifabutin, rifampin or rifapentine This list may not describe all possible interactions. Give your health care provider a list of all the medicines, herbs, non-prescription drugs, or dietary supplements you use. Also tell them if you smoke, drink alcohol, or use illegal drugs. Some items may interact with your medicine. What should I watch for while using this medicine? If you notice any changes in your vision while taking this drug, call your doctor or health care professional as soon as possible. Stop using this medicine and call your health care provider right away if you have a loss of sight in one or both eyes. Contact your doctor or health care professional right away if the erection lasts longer than 4 hours or if it becomes painful. This may be a sign of serious problem and must be treated right away to prevent permanent damage. If you experience symptoms of nausea, dizziness, chest pain or arm pain upon initiation of sexual activity after taking this medicine, you should refrain from further activity and call your doctor or health care professional as soon as possible. Do not drink alcohol to excess (examples, 5 glasses of wine or 5 shots of whiskey) when taking this medicine. When taken in excess, alcohol can increase your chances of getting a headache or getting dizzy, increasing your heart rate or lowering your blood pressure. Using this medicine does not protect you or your partner against HIV infection (the virus that causes AIDS) or other sexually transmitted diseases. What side effects may I notice from receiving this medicine? Side effects that you should report to your doctor or health care professional as soon as possible: -allergic reactions like skin rash, itching or hives, swelling of the face, lips, or tongue -breathing problems -changes in hearing -changes in  vision -chest pain -fast, irregular heartbeat -prolonged or painful erection -seizures Side effects that usually do not require medical attention (report to your doctor or health care professional if they continue or are bothersome): -back pain -dizziness -flushing -headache -indigestion -muscle aches -nausea -stuffy or runny nose This list may not describe all possible side effects. Call your doctor for medical advice about side effects. You may report side effects to FDA at 1-800-FDA-1088. Where should I keep my medicine? Keep out of the reach of children. Store at room temperature between 15 and 30 degrees C (59 and 86 degrees F). Throw away any unused medicine after the expiration date. NOTE: This sheet is a summary. It may not cover all possible information. If you have questions about this medicine, talk to your doctor, pharmacist, or health care provider.    2016, Elsevier/Gold Standard. (2014-04-16 13:15:49)

## 2015-11-12 LAB — HEPATITIS C ANTIBODY: HCV Ab: NEGATIVE

## 2015-11-12 LAB — PSA, MEDICARE: PSA: 1.5 ng/mL (ref <=4.00)

## 2015-11-14 ENCOUNTER — Encounter: Payer: Medicare Other | Admitting: Physical Therapy

## 2015-11-14 ENCOUNTER — Ambulatory Visit: Payer: Medicare Other | Attending: Orthopedic Surgery | Admitting: Physical Therapy

## 2015-11-14 DIAGNOSIS — M62830 Muscle spasm of back: Secondary | ICD-10-CM

## 2015-11-14 DIAGNOSIS — M2560 Stiffness of unspecified joint, not elsewhere classified: Secondary | ICD-10-CM | POA: Insufficient documentation

## 2015-11-14 DIAGNOSIS — R293 Abnormal posture: Secondary | ICD-10-CM | POA: Insufficient documentation

## 2015-11-14 DIAGNOSIS — M545 Low back pain, unspecified: Secondary | ICD-10-CM

## 2015-11-14 DIAGNOSIS — M5386 Other specified dorsopathies, lumbar region: Secondary | ICD-10-CM

## 2015-11-14 NOTE — Patient Instructions (Signed)
From exercise drawer:  Hip extension, calf and hamstring stretch.

## 2015-11-14 NOTE — Addendum Note (Signed)
Addended by: Christiane HaZAINO, Brenley Priore J on: 11/14/2015 02:46 PM   Modules accepted: Orders

## 2015-11-14 NOTE — Assessment & Plan Note (Signed)
Refill omeprazole

## 2015-11-14 NOTE — Assessment & Plan Note (Signed)
Will wean patient off of HCTZ as he is not tolerating. BP up today, however. Will start amlodipine 5mg  and titrate up while titrating down HCTZ. May need to add another agent.

## 2015-11-14 NOTE — Assessment & Plan Note (Signed)
Will treat as constipation as he has had numerous evaluations done for this abdominal pain.
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
## 2015-11-15 ENCOUNTER — Telehealth: Payer: Self-pay | Admitting: Family Medicine

## 2015-11-15 NOTE — Telephone Encounter (Signed)
Pt called to get his lab results from 11/11/15 and also he said that the pharmacy told him that the doctor needs to call the insurance about his Cialis, so that he can get this. jw

## 2015-11-16 ENCOUNTER — Ambulatory Visit: Payer: Medicare Other | Admitting: Physical Therapy

## 2015-11-16 ENCOUNTER — Encounter: Payer: Self-pay | Admitting: Family Medicine

## 2015-11-16 ENCOUNTER — Encounter: Payer: Medicare Other | Admitting: Physical Therapy

## 2015-11-16 DIAGNOSIS — M545 Low back pain, unspecified: Secondary | ICD-10-CM

## 2015-11-16 DIAGNOSIS — M5386 Other specified dorsopathies, lumbar region: Secondary | ICD-10-CM

## 2015-11-16 DIAGNOSIS — R293 Abnormal posture: Secondary | ICD-10-CM

## 2015-11-16 DIAGNOSIS — N529 Male erectile dysfunction, unspecified: Secondary | ICD-10-CM | POA: Insufficient documentation

## 2015-11-16 DIAGNOSIS — M2560 Stiffness of unspecified joint, not elsewhere classified: Secondary | ICD-10-CM | POA: Diagnosis not present

## 2015-11-16 DIAGNOSIS — M62830 Muscle spasm of back: Secondary | ICD-10-CM

## 2015-11-16 NOTE — Telephone Encounter (Signed)
Pt calling again, wants results of lab work.

## 2015-11-16 NOTE — Telephone Encounter (Signed)
Will forward to MD and also rn team to see if a PA was received regarding patient's medication. Brian Dominguez,CMA

## 2015-11-16 NOTE — Telephone Encounter (Signed)
Patient calling once more about this request and states that the provider needs to call [phone] to speak to the insurance about the Cialis. Sadie Reynolds, ASA
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
## 2015-11-16 NOTE — Telephone Encounter (Signed)
Prior Authorization received from [facility] for Cialis.  PA form placed in provider box for completion. Waylyn Tenbrink L, RN  

## 2015-11-17 NOTE — Telephone Encounter (Signed)
Called to inform of lab results. Patient not available. Will send lab results through mail.

## 2015-11-18 DIAGNOSIS — M545 Low back pain: Secondary | ICD-10-CM | POA: Diagnosis not present

## 2015-11-18 NOTE — Telephone Encounter (Signed)
PA denied for Cialis via [employer].  Reference number: [id number].  Clovis PuMartin, Tamika L, RN

## 2015-11-21 ENCOUNTER — Encounter: Payer: Medicare Other | Admitting: Physical Therapy

## 2015-11-21 ENCOUNTER — Telehealth: Payer: Self-pay | Admitting: Family Medicine

## 2015-11-21 NOTE — Telephone Encounter (Signed)
I will try and appeal the denial from insurance if this will be agreeable to by patient.

## 2015-11-21 NOTE — Telephone Encounter (Signed)
Pt called and would like to come in see the doctor for about 10 minutes to discuss what other options are available  to replace Cialis. Can we call him or see if the doctor would see him SDA on 11/22/15. jw

## 2015-11-21 NOTE — Telephone Encounter (Signed)
Dr. Caleb PoppNettey, Please advise. Sunday SpillersSharon T Saunders, CMA

## 2015-11-22 MED ORDER — SILDENAFIL CITRATE 100 MG PO TABS: 100.0000 mg | ORAL_TABLET | Freq: Every day | ORAL | Status: DC | PRN

## 2015-11-22 NOTE — Telephone Encounter (Signed)
Patient requesting switch in management. Will prescribe sildenafil for patient. Patient aware.

## 2015-11-22 NOTE — Telephone Encounter (Signed)
This was taken care of in phone note from 11/21/2015. Brian Dominguez, Brian Dominguez, [place]

## 2015-11-23 ENCOUNTER — Encounter: Payer: Medicare Other | Admitting: Physical Therapy

## 2015-11-25 ENCOUNTER — Ambulatory Visit: Payer: Medicare Other | Admitting: Physical Therapy

## 2015-11-25 DIAGNOSIS — M5386 Other specified dorsopathies, lumbar region: Secondary | ICD-10-CM

## 2015-11-25 DIAGNOSIS — R293 Abnormal posture: Secondary | ICD-10-CM

## 2015-11-25 DIAGNOSIS — M545 Low back pain, unspecified: Secondary | ICD-10-CM

## 2015-11-25 DIAGNOSIS — M62830 Muscle spasm of back: Secondary | ICD-10-CM

## 2015-11-25 DIAGNOSIS — M2560 Stiffness of unspecified joint, not elsewhere classified: Secondary | ICD-10-CM | POA: Diagnosis not present
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
## 2015-11-28 DIAGNOSIS — M47816 Spondylosis without myelopathy or radiculopathy, lumbar region: Secondary | ICD-10-CM | POA: Diagnosis not present

## 2015-11-30 ENCOUNTER — Ambulatory Visit: Payer: Medicare Other | Admitting: Physical Therapy

## 2015-11-30 DIAGNOSIS — M62830 Muscle spasm of back: Secondary | ICD-10-CM

## 2015-11-30 DIAGNOSIS — R293 Abnormal posture: Secondary | ICD-10-CM | POA: Diagnosis not present

## 2015-11-30 DIAGNOSIS — M5386 Other specified dorsopathies, lumbar region: Secondary | ICD-10-CM

## 2015-11-30 DIAGNOSIS — M545 Low back pain, unspecified: Secondary | ICD-10-CM

## 2015-11-30 DIAGNOSIS — M2560 Stiffness of unspecified joint, not elsewhere classified: Secondary | ICD-10-CM | POA: Diagnosis not present
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
## 2015-12-03 DIAGNOSIS — M542 Cervicalgia: Secondary | ICD-10-CM | POA: Diagnosis not present

## 2015-12-06 ENCOUNTER — Ambulatory Visit: Payer: Medicare Other | Admitting: Physical Therapy

## 2015-12-06 DIAGNOSIS — M2560 Stiffness of unspecified joint, not elsewhere classified: Secondary | ICD-10-CM | POA: Diagnosis not present

## 2015-12-06 DIAGNOSIS — M545 Low back pain, unspecified: Secondary | ICD-10-CM

## 2015-12-06 DIAGNOSIS — M62830 Muscle spasm of back: Secondary | ICD-10-CM

## 2015-12-06 DIAGNOSIS — M5386 Other specified dorsopathies, lumbar region: Secondary | ICD-10-CM

## 2015-12-06 DIAGNOSIS — R293 Abnormal posture: Secondary | ICD-10-CM
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
## 2015-12-07 ENCOUNTER — Emergency Department (HOSPITAL_COMMUNITY)
Admission: EM | Admit: 2015-12-07 | Discharge: 2015-12-07 | Disposition: A | Discharge status: Home / Self Care | Payer: Medicare Other | Attending: Emergency Medicine | Admitting: Emergency Medicine

## 2015-12-07 ENCOUNTER — Emergency Department (HOSPITAL_COMMUNITY): Payer: Medicare Other

## 2015-12-07 ENCOUNTER — Encounter (HOSPITAL_COMMUNITY): Payer: Self-pay | Admitting: Emergency Medicine

## 2015-12-07 DIAGNOSIS — F17210 Nicotine dependence, cigarettes, uncomplicated: Secondary | ICD-10-CM | POA: Diagnosis not present

## 2015-12-07 DIAGNOSIS — E119 Type 2 diabetes mellitus without complications: Secondary | ICD-10-CM | POA: Insufficient documentation

## 2015-12-07 DIAGNOSIS — Z87828 Personal history of other (healed) physical injury and trauma: Secondary | ICD-10-CM | POA: Insufficient documentation

## 2015-12-07 DIAGNOSIS — M1990 Unspecified osteoarthritis, unspecified site: Secondary | ICD-10-CM | POA: Insufficient documentation

## 2015-12-07 DIAGNOSIS — K219 Gastro-esophageal reflux disease without esophagitis: Secondary | ICD-10-CM | POA: Diagnosis not present

## 2015-12-07 DIAGNOSIS — R109 Unspecified abdominal pain: Secondary | ICD-10-CM | POA: Insufficient documentation

## 2015-12-07 DIAGNOSIS — Z9049 Acquired absence of other specified parts of digestive tract: Secondary | ICD-10-CM | POA: Insufficient documentation

## 2015-12-07 DIAGNOSIS — Z87438 Personal history of other diseases of male genital organs: Secondary | ICD-10-CM | POA: Insufficient documentation

## 2015-12-07 DIAGNOSIS — Z8619 Personal history of other infectious and parasitic diseases: Secondary | ICD-10-CM | POA: Diagnosis not present

## 2015-12-07 DIAGNOSIS — Z79899 Other long term (current) drug therapy: Secondary | ICD-10-CM | POA: Insufficient documentation

## 2015-12-07 DIAGNOSIS — I10 Essential (primary) hypertension: Secondary | ICD-10-CM | POA: Diagnosis not present

## 2015-12-07 DIAGNOSIS — K2970 Gastritis, unspecified, without bleeding: Secondary | ICD-10-CM | POA: Diagnosis not present

## 2015-12-07 DIAGNOSIS — Z7982 Long term (current) use of aspirin: Secondary | ICD-10-CM | POA: Insufficient documentation

## 2015-12-07 DIAGNOSIS — R110 Nausea: Secondary | ICD-10-CM | POA: Diagnosis not present

## 2015-12-07 DIAGNOSIS — R1013 Epigastric pain: Secondary | ICD-10-CM | POA: Diagnosis present

## 2015-12-07 LAB — COMPREHENSIVE METABOLIC PANEL
ALT: 15 U/L — AB (ref 17–63)
AST: 19 U/L (ref 15–41)
Albumin: 4.5 g/dL (ref 3.5–5.0)
Alkaline Phosphatase: 62 U/L (ref 38–126)
Anion gap: 8 (ref 5–15)
BUN: 11 mg/dL (ref 6–20)
CHLORIDE: 99 mmol/L — AB (ref 101–111)
CO2: 30 mmol/L (ref 22–32)
CREATININE: 0.95 mg/dL (ref 0.61–1.24)
Calcium: 9.4 mg/dL (ref 8.9–10.3)
GFR calc non Af Amer: >60 mL/min (ref >60)
Glucose, Bld: 178 mg/dL — ABNORMAL HIGH (ref 65–99)
POTASSIUM: 4 mmol/L (ref 3.5–5.1)
SODIUM: 137 mmol/L (ref 135–145)
Total Bilirubin: 0.4 mg/dL (ref 0.3–1.2)
Total Protein: 8.3 g/dL — ABNORMAL HIGH (ref 6.5–8.1)

## 2015-12-07 LAB — CBC WITH DIFFERENTIAL/PLATELET
Basophils Absolute: 0 10*3/uL (ref 0.0–0.1)
Basophils Relative: 0 %
EOS ABS: 0.3 10*3/uL (ref 0.0–0.7)
Eosinophils Relative: 5 %
HEMATOCRIT: 43.7 % (ref 39.0–52.0)
HEMOGLOBIN: 14.9 g/dL (ref 13.0–17.0)
LYMPHS ABS: 2.1 10*3/uL (ref 0.7–4.0)
LYMPHS PCT: 36 %
MCH: 27.7 pg (ref 26.0–34.0)
MCHC: 34.1 g/dL (ref 30.0–36.0)
MCV: 81.2 fL (ref 78.0–100.0)
MONOS PCT: 8 %
Monocytes Absolute: 0.5 10*3/uL (ref 0.1–1.0)
NEUTROS PCT: 51 %
Neutro Abs: 3 10*3/uL (ref 1.7–7.7)
Platelets: 295 10*3/uL (ref 150–400)
RBC: 5.38 MIL/uL (ref 4.22–5.81)
RDW: 14.2 % (ref 11.5–15.5)
WBC: 5.9 10*3/uL (ref 4.0–10.5)

## 2015-12-07 LAB — URINALYSIS, ROUTINE W REFLEX MICROSCOPIC
Bilirubin Urine: NEGATIVE
Glucose, UA: 100 mg/dL — AB
HGB URINE DIPSTICK: NEGATIVE
KETONES UR: NEGATIVE mg/dL
LEUKOCYTES UA: NEGATIVE
Nitrite: NEGATIVE
PROTEIN: NEGATIVE mg/dL
Specific Gravity, Urine: 1.019 (ref 1.005–1.030)
pH: 6 (ref 5.0–8.0)

## 2015-12-07 LAB — LIPASE, BLOOD: LIPASE: 43 U/L (ref 11–51)

## 2015-12-07 MED ORDER — OXYCODONE-ACETAMINOPHEN 5-325 MG PO TABS: 1.0000 | ORAL_TABLET | ORAL | Status: DC | PRN

## 2015-12-07 MED ORDER — FENTANYL CITRATE (PF) 100 MCG/2ML IJ SOLN
50.0000 ug | Freq: Once | INTRAMUSCULAR | Status: AC
  Administered 2015-12-07: 50 ug via INTRAVENOUS
  Filled 2015-12-07: qty 2

## 2015-12-07 MED ORDER — SODIUM CHLORIDE 0.9 % IV BOLUS (SEPSIS)
1000.0000 mL | Freq: Once | INTRAVENOUS | Status: AC
  Administered 2015-12-07: 1000 mL via INTRAVENOUS

## 2015-12-07 NOTE — ED Notes (Signed)
Awake. Verbally responsive. A/O x4. Resp even and unlabored. No audible adventitious breath sounds noted. ABC's intact. IV infusing NS at 999ml/hr without difficulty. 

## 2015-12-07 NOTE — ED Notes (Signed)
Patient here from home with complaints of right sided flank pain that started 2:30am. Burning sensation to right side. Hx diabetes. Gallbladder removed. Reports dark urine and burning. Denies ETOH. Nausea no vomiting.
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
## 2015-12-07 NOTE — ED Notes (Signed)
Pt reported rt flank pain, dysuria, burning with voiding, pt denies urinary frequency/urgency, hematuria, pressure with voiding.
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
## 2015-12-07 NOTE — ED Notes (Signed)
Awake. Verbally responsive. A/O x4. Resp even and unlabored. No audible adventitious breath sounds noted. ABC's intact.  

## 2015-12-07 NOTE — Discharge Instructions (Signed)
Test showed no life-threatening condition. Prescription for pain. [facility]

## 2015-12-07 NOTE — ED Notes (Signed)
Patient transported to X-ray 

## 2015-12-08 ENCOUNTER — Ambulatory Visit: Payer: Medicare Other | Admitting: Physical Therapy

## 2015-12-08 DIAGNOSIS — M62830 Muscle spasm of back: Secondary | ICD-10-CM

## 2015-12-08 DIAGNOSIS — R293 Abnormal posture: Secondary | ICD-10-CM | POA: Diagnosis not present

## 2015-12-08 DIAGNOSIS — M2560 Stiffness of unspecified joint, not elsewhere classified: Secondary | ICD-10-CM | POA: Diagnosis not present

## 2015-12-08 DIAGNOSIS — M5386 Other specified dorsopathies, lumbar region: Secondary | ICD-10-CM

## 2015-12-08 DIAGNOSIS — M545 Low back pain, unspecified: Secondary | ICD-10-CM

## 2015-12-08 NOTE — Patient Instructions (Signed)
Remove ionto patch in 6 hours.
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
## 2015-12-13 ENCOUNTER — Ambulatory Visit: Payer: Medicare Other | Attending: Orthopedic Surgery | Admitting: Physical Therapy

## 2015-12-13 DIAGNOSIS — R293 Abnormal posture: Secondary | ICD-10-CM | POA: Insufficient documentation

## 2015-12-13 DIAGNOSIS — M2560 Stiffness of unspecified joint, not elsewhere classified: Secondary | ICD-10-CM | POA: Insufficient documentation

## 2015-12-13 DIAGNOSIS — M545 Low back pain: Secondary | ICD-10-CM | POA: Insufficient documentation

## 2015-12-13 DIAGNOSIS — M25612 Stiffness of left shoulder, not elsewhere classified: Secondary | ICD-10-CM | POA: Insufficient documentation

## 2015-12-13 DIAGNOSIS — M62830 Muscle spasm of back: Secondary | ICD-10-CM | POA: Insufficient documentation

## 2015-12-13 DIAGNOSIS — R29898 Other symptoms and signs involving the musculoskeletal system: Secondary | ICD-10-CM | POA: Insufficient documentation

## 2015-12-15 ENCOUNTER — Ambulatory Visit: Payer: Medicare Other | Admitting: Physical Therapy

## 2015-12-15 DIAGNOSIS — M25612 Stiffness of left shoulder, not elsewhere classified: Secondary | ICD-10-CM

## 2015-12-15 DIAGNOSIS — M5386 Other specified dorsopathies, lumbar region: Secondary | ICD-10-CM

## 2015-12-15 DIAGNOSIS — M2560 Stiffness of unspecified joint, not elsewhere classified: Secondary | ICD-10-CM | POA: Diagnosis not present

## 2015-12-15 DIAGNOSIS — M62830 Muscle spasm of back: Secondary | ICD-10-CM

## 2015-12-15 DIAGNOSIS — R293 Abnormal posture: Secondary | ICD-10-CM

## 2015-12-15 DIAGNOSIS — M545 Low back pain, unspecified: Secondary | ICD-10-CM

## 2015-12-15 DIAGNOSIS — R29898 Other symptoms and signs involving the musculoskeletal system: Secondary | ICD-10-CM
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
## 2015-12-20 DIAGNOSIS — M47816 Spondylosis without myelopathy or radiculopathy, lumbar region: Secondary | ICD-10-CM | POA: Diagnosis not present

## 2015-12-27 DIAGNOSIS — M170 Bilateral primary osteoarthritis of knee: Secondary | ICD-10-CM | POA: Diagnosis not present

## 2015-12-29 ENCOUNTER — Other Ambulatory Visit: Payer: Self-pay | Admitting: *Deleted

## 2015-12-30 NOTE — Telephone Encounter (Signed)
2nd request.  Verdelle Valtierra L, RN  

## 2016-01-02 MED ORDER — METFORMIN HCL 1000 MG PO TABS: 1000.0000 mg | ORAL_TABLET | Freq: Two times a day (BID) | ORAL | Status: DC

## 2016-01-03 DIAGNOSIS — M542 Cervicalgia: Secondary | ICD-10-CM | POA: Diagnosis not present

## 2016-01-09 DIAGNOSIS — M791 Myalgia: Secondary | ICD-10-CM | POA: Diagnosis not present

## 2016-01-09 DIAGNOSIS — M47816 Spondylosis without myelopathy or radiculopathy, lumbar region: Secondary | ICD-10-CM | POA: Diagnosis not present

## 2016-01-14 ENCOUNTER — Other Ambulatory Visit: Payer: Self-pay | Admitting: Family Medicine

## 2016-01-17 DIAGNOSIS — M47816 Spondylosis without myelopathy or radiculopathy, lumbar region: Secondary | ICD-10-CM | POA: Diagnosis not present

## 2016-01-24 DIAGNOSIS — M47816 Spondylosis without myelopathy or radiculopathy, lumbar region: Secondary | ICD-10-CM | POA: Diagnosis not present

## 2016-02-01 DIAGNOSIS — M47816 Spondylosis without myelopathy or radiculopathy, lumbar region: Secondary | ICD-10-CM | POA: Diagnosis not present

## 2016-02-03 DIAGNOSIS — M542 Cervicalgia: Secondary | ICD-10-CM | POA: Diagnosis not present

## 2016-02-09 DIAGNOSIS — M47816 Spondylosis without myelopathy or radiculopathy, lumbar region: Secondary | ICD-10-CM | POA: Diagnosis not present

## 2016-02-28 ENCOUNTER — Other Ambulatory Visit: Payer: Self-pay | Admitting: *Deleted

## 2016-02-29 MED ORDER — ESOMEPRAZOLE MAGNESIUM 20 MG PO CPDR: 20.0000 mg | DELAYED_RELEASE_CAPSULE | Freq: Every day | ORAL | Status: DC

## 2016-02-29 NOTE — Telephone Encounter (Signed)
Refill approved. Sent to pharmacy. 

## 2016-03-02 DIAGNOSIS — M542 Cervicalgia: Secondary | ICD-10-CM | POA: Diagnosis not present

## 2016-03-08 ENCOUNTER — Emergency Department (HOSPITAL_COMMUNITY): Payer: Medicare Other

## 2016-03-08 ENCOUNTER — Encounter (HOSPITAL_COMMUNITY): Payer: Self-pay | Admitting: Emergency Medicine

## 2016-03-08 ENCOUNTER — Emergency Department (HOSPITAL_COMMUNITY)
Admission: EM | Admit: 2016-03-08 | Discharge: 2016-03-08 | Disposition: A | Discharge status: Home / Self Care | Payer: Medicare Other | Attending: Emergency Medicine | Admitting: Emergency Medicine

## 2016-03-08 DIAGNOSIS — F17210 Nicotine dependence, cigarettes, uncomplicated: Secondary | ICD-10-CM | POA: Diagnosis not present

## 2016-03-08 DIAGNOSIS — Z8619 Personal history of other infectious and parasitic diseases: Secondary | ICD-10-CM | POA: Insufficient documentation

## 2016-03-08 DIAGNOSIS — E119 Type 2 diabetes mellitus without complications: Secondary | ICD-10-CM | POA: Diagnosis not present

## 2016-03-08 DIAGNOSIS — K5900 Constipation, unspecified: Secondary | ICD-10-CM

## 2016-03-08 DIAGNOSIS — Z79899 Other long term (current) drug therapy: Secondary | ICD-10-CM | POA: Diagnosis not present

## 2016-03-08 DIAGNOSIS — Z7984 Long term (current) use of oral hypoglycemic drugs: Secondary | ICD-10-CM | POA: Diagnosis not present

## 2016-03-08 DIAGNOSIS — I10 Essential (primary) hypertension: Secondary | ICD-10-CM | POA: Diagnosis not present

## 2016-03-08 DIAGNOSIS — K219 Gastro-esophageal reflux disease without esophagitis: Secondary | ICD-10-CM | POA: Diagnosis not present

## 2016-03-08 DIAGNOSIS — R1031 Right lower quadrant pain: Secondary | ICD-10-CM | POA: Diagnosis present

## 2016-03-08 DIAGNOSIS — K869 Disease of pancreas, unspecified: Secondary | ICD-10-CM | POA: Diagnosis not present

## 2016-03-08 DIAGNOSIS — K8689 Other specified diseases of pancreas: Secondary | ICD-10-CM | POA: Diagnosis not present

## 2016-03-08 DIAGNOSIS — Z7982 Long term (current) use of aspirin: Secondary | ICD-10-CM | POA: Diagnosis not present

## 2016-03-08 LAB — COMPREHENSIVE METABOLIC PANEL
ALK PHOS: 86 U/L (ref 38–126)
ALT: 67 U/L — ABNORMAL HIGH (ref 17–63)
AST: 25 U/L (ref 15–41)
Albumin: 4 g/dL (ref 3.5–5.0)
Anion gap: 12 (ref 5–15)
BUN: 10 mg/dL (ref 6–20)
CALCIUM: 9.5 mg/dL (ref 8.9–10.3)
CHLORIDE: 100 mmol/L — AB (ref 101–111)
CO2: 24 mmol/L (ref 22–32)
CREATININE: 0.96 mg/dL (ref 0.61–1.24)
Glucose, Bld: 201 mg/dL — ABNORMAL HIGH (ref 65–99)
Potassium: 3.9 mmol/L (ref 3.5–5.1)
SODIUM: 136 mmol/L (ref 135–145)
Total Bilirubin: 0.8 mg/dL (ref 0.3–1.2)
Total Protein: 7.4 g/dL (ref 6.5–8.1)

## 2016-03-08 LAB — CBC
HCT: 42.8 % (ref 39.0–52.0)
HEMOGLOBIN: 14.7 g/dL (ref 13.0–17.0)
MCH: 27.7 pg (ref 26.0–34.0)
MCHC: 34.3 g/dL (ref 30.0–36.0)
MCV: 80.6 fL (ref 78.0–100.0)
Platelets: 312 10*3/uL (ref 150–400)
RBC: 5.31 MIL/uL (ref 4.22–5.81)
RDW: 13.3 % (ref 11.5–15.5)
WBC: 5.6 10*3/uL (ref 4.0–10.5)

## 2016-03-08 LAB — URINALYSIS, ROUTINE W REFLEX MICROSCOPIC
Bilirubin Urine: NEGATIVE
Glucose, UA: 100 mg/dL — AB
Hgb urine dipstick: NEGATIVE
KETONES UR: NEGATIVE mg/dL
LEUKOCYTES UA: NEGATIVE
NITRITE: NEGATIVE
PH: 6.5 (ref 5.0–8.0)
PROTEIN: NEGATIVE mg/dL
Specific Gravity, Urine: 1.018 (ref 1.005–1.030)

## 2016-03-08 LAB — LIPASE, BLOOD: LIPASE: 91 U/L — AB (ref 11–51)

## 2016-03-08 MED ORDER — ONDANSETRON HCL 4 MG/2ML IJ SOLN
4.0000 mg | Freq: Once | INTRAMUSCULAR | Status: AC
  Administered 2016-03-08: 4 mg via INTRAVENOUS
  Filled 2016-03-08: qty 2

## 2016-03-08 MED ORDER — MORPHINE SULFATE (PF) 4 MG/ML IV SOLN
4.0000 mg | Freq: Once | INTRAVENOUS | Status: AC
  Administered 2016-03-08: 4 mg via INTRAVENOUS
  Filled 2016-03-08: qty 1

## 2016-03-08 MED ORDER — SODIUM CHLORIDE 0.9 % IV BOLUS (SEPSIS)
1000.0000 mL | Freq: Once | INTRAVENOUS | Status: AC
  Administered 2016-03-08: 1000 mL via INTRAVENOUS

## 2016-03-08 MED ORDER — MAGNESIUM CITRATE PO SOLN: 1.0000 | Freq: Once | ORAL | Status: DC

## 2016-03-08 MED ORDER — LORAZEPAM 2 MG/ML IJ SOLN: 2.0000 mg | Freq: Once | INTRAMUSCULAR | Status: DC

## 2016-03-08 MED ORDER — IOPAMIDOL (ISOVUE-300) INJECTION 61%
INTRAVENOUS | Status: AC
  Administered 2016-03-08: 100 mL
  Filled 2016-03-08: qty 100

## 2016-03-08 NOTE — ED Notes (Signed)
Pt A&Ox4, ambulatory at d/c with steady gait, NAD 

## 2016-03-08 NOTE — ED Notes (Signed)
Patient transported to CT 

## 2016-03-08 NOTE — Discharge Instructions (Signed)
Constipation, Adult Constipation is when a person:  Poops (has a bowel movement) less than 3 times a week.  Has a hard time pooping.  Has poop that is dry, hard, or bigger than normal. HOME CARE   Eat foods with a lot of fiber in them. This includes fruits, vegetables, beans, and whole grains such as brown rice.  Avoid fatty foods and foods with a lot of sugar. This includes french fries, hamburgers, cookies, candy, and soda.  If you are not getting enough fiber from food, take products with added fiber in them (supplements).  Drink enough fluid to keep your pee (urine) clear or pale yellow.  Exercise on a regular basis, or as told by your doctor.  Go to the restroom when you feel like you need to poop. Do not hold it.  Only take medicine as told by your doctor. Do not take medicines that help you poop (laxatives) without talking to your doctor first. GET HELP RIGHT AWAY IF:   You have bright red blood in your poop (stool).  Your constipation lasts more than 4 days or gets worse.  You have belly (abdominal) or butt (rectal) pain.  You have thin poop (as thin as a pencil).  You lose weight, and it cannot be explained. MAKE SURE YOU:   Understand these instructions.  Will watch your condition.  Will get help right away if you are not doing well or get worse.   This information is not intended to replace advice given to you by your health care provider. Make sure you discuss any questions you have with your health care provider.   Document Released: 05/14/2008 Document Revised: 12/17/2014 Document Reviewed: 09/07/2013 Elsevier Interactive Patient Education 2016 [employer]  

## 2016-03-08 NOTE — ED Notes (Signed)
Upper mid epigastric pain x 1 week radiating into his back. Pt states he thinks he is constipated from chronic opioid use. Last  BM 1 week ago. C/o nausea, no vomiting.
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
## 2016-03-12 ENCOUNTER — Other Ambulatory Visit: Payer: Self-pay | Admitting: Gastroenterology

## 2016-03-12 DIAGNOSIS — K862 Cyst of pancreas: Secondary | ICD-10-CM

## 2016-03-12 DIAGNOSIS — R933 Abnormal findings on diagnostic imaging of other parts of digestive tract: Secondary | ICD-10-CM | POA: Diagnosis not present

## 2016-03-12 DIAGNOSIS — R1011 Right upper quadrant pain: Secondary | ICD-10-CM | POA: Diagnosis not present

## 2016-03-12 DIAGNOSIS — R935 Abnormal findings on diagnostic imaging of other abdominal regions, including retroperitoneum: Secondary | ICD-10-CM

## 2016-03-26 ENCOUNTER — Telehealth: Payer: Self-pay | Admitting: Family Medicine

## 2016-03-26 NOTE — Telephone Encounter (Signed)
Patient already has a prescription for loratidine from 2016 with 11 refills.

## 2016-03-26 NOTE — Telephone Encounter (Signed)
Need rx for allergy.  Have runny eyes and nose.

## 2016-03-27 NOTE — Telephone Encounter (Signed)
Mr. Brian Dominguez request a call back provider regarding this message.

## 2016-03-28 ENCOUNTER — Ambulatory Visit
Admission: RE | Admit: 2016-03-28 | Discharge: 2016-03-28 | Disposition: A | Discharge status: Home / Self Care | Payer: Medicare Other | Source: Ambulatory Visit | Attending: Gastroenterology | Admitting: Gastroenterology

## 2016-03-28 DIAGNOSIS — K862 Cyst of pancreas: Secondary | ICD-10-CM

## 2016-03-28 DIAGNOSIS — K8689 Other specified diseases of pancreas: Secondary | ICD-10-CM | POA: Diagnosis not present

## 2016-03-28 DIAGNOSIS — R935 Abnormal findings on diagnostic imaging of other abdominal regions, including retroperitoneum: Secondary | ICD-10-CM

## 2016-03-28 MED ORDER — CETIRIZINE HCL 10 MG PO TBDP: 1.0000 | ORAL_TABLET | Freq: Every day | ORAL | Status: DC

## 2016-03-28 MED ORDER — GADOBENATE DIMEGLUMINE 529 MG/ML IV SOLN
20.0000 mL | Freq: Once | INTRAVENOUS | Status: AC | PRN
  Administered 2016-03-28: 20 mL via INTRAVENOUS

## 2016-03-28 NOTE — Telephone Encounter (Signed)
I responded to this message on 4/17. Please inform patient of previous message. Thanks!

## 2016-03-28 NOTE — Addendum Note (Signed)
Addended by: Jacquelin HawkingNETTEY, Bazil Dhanani A on: 03/28/2016 06:07 PM   Modules accepted: Orders, Medications

## 2016-03-28 NOTE — Telephone Encounter (Signed)
Zyrtec called into pharmacy. Patient can pick this up. Please inform. Thanks!

## 2016-03-28 NOTE — Telephone Encounter (Signed)
Spoke to pt. He said he has stopped taking the loratidine because it doesn't work for him. He still has problems. Would like something else called in. Please advise. Sunday SpillersSharon T Britney Newstrom, CMA

## 2016-04-02 DIAGNOSIS — M542 Cervicalgia: Secondary | ICD-10-CM | POA: Diagnosis not present

## 2016-04-02 NOTE — Telephone Encounter (Signed)
LM to call back to inform him of below. Brian Dominguez, Brian Dominguez, [place]

## 2016-04-03 NOTE — Telephone Encounter (Signed)
Pt informed. Zimmerman Rumple, April D, CMA  

## 2016-04-30 ENCOUNTER — Other Ambulatory Visit: Payer: Self-pay | Admitting: Family Medicine

## 2016-04-30 NOTE — Telephone Encounter (Signed)
Need refill on metformin to [facility] on [address]

## 2016-05-01 MED ORDER — METFORMIN HCL 1000 MG PO TABS: 1000.0000 mg | ORAL_TABLET | Freq: Two times a day (BID) | ORAL | Status: DC

## 2016-05-01 NOTE — Telephone Encounter (Signed)
Refill sent.

## 2016-05-02 DIAGNOSIS — M542 Cervicalgia: Secondary | ICD-10-CM | POA: Diagnosis not present

## 2016-05-02 NOTE — Telephone Encounter (Signed)
Pt informed of below. Zimmerman Rumple, April D, CMA  

## 2016-05-04 DIAGNOSIS — M5416 Radiculopathy, lumbar region: Secondary | ICD-10-CM | POA: Diagnosis not present

## 2016-05-14 ENCOUNTER — Other Ambulatory Visit: Payer: Self-pay | Admitting: Orthopedic Surgery

## 2016-05-14 DIAGNOSIS — M5416 Radiculopathy, lumbar region: Secondary | ICD-10-CM

## 2016-05-18 ENCOUNTER — Encounter: Payer: Self-pay | Admitting: Family Medicine

## 2016-05-18 ENCOUNTER — Ambulatory Visit (INDEPENDENT_AMBULATORY_CARE_PROVIDER_SITE_OTHER): Payer: Medicare Other | Admitting: Family Medicine

## 2016-05-18 VITALS — BP 149/80 | HR 86 | Temp 97.8°F | Wt 230.8 lb

## 2016-05-18 DIAGNOSIS — E119 Type 2 diabetes mellitus without complications: Secondary | ICD-10-CM | POA: Diagnosis not present

## 2016-05-18 DIAGNOSIS — N400 Enlarged prostate without lower urinary tract symptoms: Secondary | ICD-10-CM

## 2016-05-18 DIAGNOSIS — R1031 Right lower quadrant pain: Secondary | ICD-10-CM

## 2016-05-18 DIAGNOSIS — I10 Essential (primary) hypertension: Secondary | ICD-10-CM | POA: Diagnosis not present

## 2016-05-18 DIAGNOSIS — R300 Dysuria: Secondary | ICD-10-CM | POA: Diagnosis not present

## 2016-05-18 LAB — POCT URINALYSIS DIPSTICK
Bilirubin, UA: NEGATIVE
GLUCOSE UA: 100
KETONES UA: NEGATIVE
Nitrite, UA: NEGATIVE
RBC UA: NEGATIVE
SPEC GRAV UA: 1.02
UROBILINOGEN UA: 1
pH, UA: 7

## 2016-05-18 LAB — POCT UA - MICROSCOPIC ONLY

## 2016-05-18 LAB — POCT GLYCOSYLATED HEMOGLOBIN (HGB A1C): Hemoglobin A1C: 7.7

## 2016-05-18 MED ORDER — TAMSULOSIN HCL 0.4 MG PO CAPS: 0.4000 mg | ORAL_CAPSULE | Freq: Every day | ORAL | Status: DC

## 2016-05-18 MED ORDER — POLYETHYLENE GLYCOL 3350 17 GM/SCOOP PO POWD: 17.0000 g | Freq: Every day | ORAL | Status: DC

## 2016-05-18 NOTE — Assessment & Plan Note (Signed)
Hemoglobin A1c slightly elevated today. Patient admits that his diet has not been focused. Possible cause of elevated A1c. We discussed dietary modification again. Patient is okay with this at this time. Will not add new medication regimen at this time. Patient is due for ophthalmology evaluation. Foot exam completed during this visit.

## 2016-05-18 NOTE — Progress Notes (Signed)
    Subjective    Brian Dominguez is a 70 y.o. male that presents for a follow-up visit for:   1. Right sided abdominal pain: This is chronic. Pain is achy pain. Pain is constant. He is able sleep with the pain. Some foods make the pain worse. He states that he has infrequent bowel movements, having them about once per week with straining.   2. Diabetes: Patient states he has been adherent with Metformin 1g twice daily. He states that his fasting blood sugar has been in the 200s which is higher than previously. He attributes this change to poor food choices.   3. Dysuria: Symptoms started about two weeks ago. He has associated frequency. No fevers, chills or vomiting. He does reports some nausea. Pain is present when terminating stream. No hematuria. Patient is not sexually active. No discharge.  4. Hypertension: He is adherent with hydrochlorothiazide 25mg  daily. No chest pain or dyspnea. No leg swelling.   Social History  Substance Use Topics  . Smoking status: Current Every Day Smoker -- 0.25 packs/day for 35 years    Types: Cigarettes  . Smokeless tobacco: Never Used     Comment: trying to quitt quit for a while 10 yrs smoking now  . Alcohol Use: No    Allergies  Allergen Reactions  . Enalapril Swelling    Angioedema of the lips with enalapril    No orders of the defined types were placed in this encounter.    ROS  Per HPI   Objective   BP 149/80 mmHg  Pulse 86  Temp(Src) 97.8 F (36.6 C) (Oral)  Wt 230 lb 12.8 oz (104.69 kg)  Vital signs reviewed  General: Well appearing, no distress Genitourinary: Penis is uncircumcised. Urethral meatus appears traumatized with no blood present. No external lesions present. No tenderness.    Musculoskeletal: Please refer to diabetic foot exam in office encounter  Assessment and Plan    DM type 2 (diabetes mellitus, type 2) Hemoglobin A1c slightly elevated today. Patient admits that his diet has not been focused. Possible cause  of elevated A1c. We discussed dietary modification again. Patient is okay with this at this time. Will not add new medication regimen at this time. Patient is due for ophthalmology evaluation. Foot exam completed during this visit.  Abdominal pain Patient is currently followed by gastroenterology for this issue. He is status post cholecystectomy and have a hernia repair. Possible that this could be related to constipation. Doubt bowel obstruction as exam showed bowel sounds and patient is having stools, although very infrequently. I had tried to treat constipation the past the patient states he stopped. Will reattempt to treat constipation at this time. Will refill her Lasix. 1 capful daily, up to 2 capfuls daily to obtain one soft stool daily.  HYPERTENSION, BENIGN SYSTEMIC Blood pressure not optimally controlled today. Patient is adherent with amlodipine 10 mg daily and hydrochlorothiazide 25 mg daily. He is asymptomatic. If elevated at next visit, will likely need to add new agent. I am adding Flomax to his regimen, which may help as well.  BPH (benign prostatic hyperplasia) Patient having issues with hesitation. He is currently not being treated for symptoms of BPH. Will start Flomax daily. He is having symptoms of dysuria. Urinalysis does not suggest a UTI, but will obtain a urine culture.

## 2016-05-18 NOTE — Assessment & Plan Note (Signed)
Blood pressure not optimally controlled today. Patient is adherent with amlodipine 10 mg daily and hydrochlorothiazide 25 mg daily. He is asymptomatic. If elevated at next visit, will likely need to add new agent. I am adding Flomax to his regimen, which may help as well.

## 2016-05-18 NOTE — Assessment & Plan Note (Signed)
Patient having issues with hesitation. He is currently not being treated for symptoms of BPH. Will start Flomax daily. He is having symptoms of dysuria. Urinalysis does not suggest a UTI, but will obtain a urine culture.

## 2016-05-18 NOTE — Patient Instructions (Signed)
Thank you for coming to see me today. It was a pleasure. Today we talked about:   Diabetes: No changes to your medications. Please work on your diet, including decreasing carbohydrate intake and losing weight.  Hypertension: No changes.  Urination: I am going to start you on Flomax. Take this daily.  Abdominal pain: I will start you on miralax. Take this daily. You can increase to twice daily if not having daily soft stools.   Please make an appointment to see new doctor in 3 months or sooner.  If you have any questions or concerns, please do not hesitate to call the office at [phone].  Sincerely,  Jacquelin Hawkingalph Dawon Troop, MD

## 2016-05-18 NOTE — Assessment & Plan Note (Signed)
Patient is currently followed by gastroenterology for this issue. He is status post cholecystectomy and have a hernia repair. Possible that this could be related to constipation. Doubt bowel obstruction as exam showed bowel sounds and patient is having stools, although very infrequently. I had tried to treat constipation the past the patient states he stopped. Will reattempt to treat constipation at this time. Will refill her Lasix. 1 capful daily, up to 2 capfuls daily to obtain one soft stool daily.

## 2016-05-19 LAB — MICROALBUMIN, URINE: MICROALB UR: 0.8 mg/dL

## 2016-05-20 LAB — URINE CULTURE
Colony Count: NO GROWTH
Organism ID, Bacteria: NO GROWTH

## 2016-05-21 ENCOUNTER — Encounter: Payer: Self-pay | Admitting: Family Medicine

## 2016-05-23 ENCOUNTER — Ambulatory Visit
Admission: RE | Admit: 2016-05-23 | Discharge: 2016-05-23 | Disposition: A | Discharge status: Home / Self Care | Payer: Medicare Other | Source: Ambulatory Visit | Attending: Orthopedic Surgery | Admitting: Orthopedic Surgery

## 2016-05-23 DIAGNOSIS — M5416 Radiculopathy, lumbar region: Secondary | ICD-10-CM

## 2016-05-23 DIAGNOSIS — M5126 Other intervertebral disc displacement, lumbar region: Secondary | ICD-10-CM | POA: Diagnosis not present

## 2016-05-23 DIAGNOSIS — M4806 Spinal stenosis, lumbar region: Secondary | ICD-10-CM | POA: Diagnosis not present

## 2016-05-25 DIAGNOSIS — M545 Low back pain: Secondary | ICD-10-CM | POA: Diagnosis not present

## 2016-06-02 DIAGNOSIS — M542 Cervicalgia: Secondary | ICD-10-CM | POA: Diagnosis not present

## 2016-06-05 ENCOUNTER — Other Ambulatory Visit: Payer: Self-pay | Admitting: *Deleted

## 2016-06-05 MED ORDER — ESOMEPRAZOLE MAGNESIUM 20 MG PO CPDR: 20.0000 mg | DELAYED_RELEASE_CAPSULE | Freq: Every day | ORAL | Status: DC

## 2016-06-06 ENCOUNTER — Other Ambulatory Visit: Payer: Self-pay | Admitting: *Deleted

## 2016-06-06 MED ORDER — AMLODIPINE BESYLATE 10 MG PO TABS: 5.0000 mg | ORAL_TABLET | Freq: Every day | ORAL | Status: DC

## 2016-06-07 ENCOUNTER — Ambulatory Visit: Payer: Medicare Other | Attending: Family Medicine | Admitting: Physical Therapy

## 2016-06-07 ENCOUNTER — Encounter: Payer: Self-pay | Admitting: Physical Therapy

## 2016-06-07 DIAGNOSIS — M62830 Muscle spasm of back: Secondary | ICD-10-CM | POA: Diagnosis not present

## 2016-06-07 DIAGNOSIS — R262 Difficulty in walking, not elsewhere classified: Secondary | ICD-10-CM

## 2016-06-07 DIAGNOSIS — M6281 Muscle weakness (generalized): Secondary | ICD-10-CM | POA: Diagnosis not present

## 2016-06-07 DIAGNOSIS — M5441 Lumbago with sciatica, right side: Secondary | ICD-10-CM
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
## 2016-06-19 ENCOUNTER — Ambulatory Visit: Payer: Medicare Other | Attending: Family Medicine | Admitting: Physical Therapy

## 2016-06-19 DIAGNOSIS — M6281 Muscle weakness (generalized): Secondary | ICD-10-CM | POA: Diagnosis not present

## 2016-06-19 DIAGNOSIS — R262 Difficulty in walking, not elsewhere classified: Secondary | ICD-10-CM | POA: Insufficient documentation

## 2016-06-19 DIAGNOSIS — M62830 Muscle spasm of back: Secondary | ICD-10-CM

## 2016-06-19 DIAGNOSIS — M5441 Lumbago with sciatica, right side: Secondary | ICD-10-CM | POA: Diagnosis not present
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
## 2016-06-21 ENCOUNTER — Ambulatory Visit: Payer: Medicare Other | Admitting: Physical Therapy

## 2016-06-21 ENCOUNTER — Encounter: Payer: Self-pay | Admitting: Physical Therapy

## 2016-06-21 DIAGNOSIS — R262 Difficulty in walking, not elsewhere classified: Secondary | ICD-10-CM | POA: Diagnosis not present

## 2016-06-21 DIAGNOSIS — M5441 Lumbago with sciatica, right side: Secondary | ICD-10-CM | POA: Diagnosis not present

## 2016-06-21 DIAGNOSIS — M62830 Muscle spasm of back: Secondary | ICD-10-CM

## 2016-06-21 DIAGNOSIS — M6281 Muscle weakness (generalized): Secondary | ICD-10-CM
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
## 2016-06-26 ENCOUNTER — Ambulatory Visit: Payer: Medicare Other | Admitting: Physical Therapy

## 2016-06-26 DIAGNOSIS — M5441 Lumbago with sciatica, right side: Secondary | ICD-10-CM

## 2016-06-26 DIAGNOSIS — R262 Difficulty in walking, not elsewhere classified: Secondary | ICD-10-CM | POA: Diagnosis not present

## 2016-06-26 DIAGNOSIS — M6281 Muscle weakness (generalized): Secondary | ICD-10-CM

## 2016-06-26 DIAGNOSIS — M62830 Muscle spasm of back: Secondary | ICD-10-CM | POA: Diagnosis not present
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
## 2016-06-28 ENCOUNTER — Other Ambulatory Visit: Payer: Self-pay | Admitting: *Deleted

## 2016-06-28 ENCOUNTER — Ambulatory Visit: Payer: Medicare Other | Admitting: Physical Therapy

## 2016-06-28 DIAGNOSIS — R262 Difficulty in walking, not elsewhere classified: Secondary | ICD-10-CM

## 2016-06-28 DIAGNOSIS — M6281 Muscle weakness (generalized): Secondary | ICD-10-CM

## 2016-06-28 DIAGNOSIS — M62830 Muscle spasm of back: Secondary | ICD-10-CM | POA: Diagnosis not present

## 2016-06-28 DIAGNOSIS — M5441 Lumbago with sciatica, right side: Secondary | ICD-10-CM | POA: Diagnosis not present

## 2016-06-28 MED ORDER — GLUCOSE BLOOD VI STRP: ORAL_STRIP | Status: DC
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
## 2016-06-28 NOTE — Patient Instructions (Signed)
Hip Extension (Prone)  Draw in abdominals, HOLD Lift left leg __6__ inches from floor, keeping knee locked. Repeat __10__ times per set. Do _2__ sets per session. Do __2__ sessions per day.   Strengthening: Straight Leg Raise (Phase 1)  DRAW IN ABDOMINALS, HOLD Tighten muscles on front of right thigh, then lift leg _12_ inches from surface, keeping knee locked.  Repeat _10-20___ times per set. Do __2__ sets per session. Do ___2_ sessions per day.      Isometric Hold With Pelvic Floor (Hook-Lying)  Lie with hips and knees bent. Slowly inhale, and then exhale. Pull navel toward spine and tighten pelvic floor. Hold for __10_ seconds. Continue to breathe in and out during hold. Rest for _10__ seconds. Repeat __10_ times. Do __2-3_ times a day.   DRAW IN ABDOMINALS HOLD Lie on back, legs bent, arms by sides. Exhale, lifting knee to chest. Inhale, returning. Keep abdominals flat, navel to spine. Repeat __10__ times, alternating legs. Do __2__ sessions per day.   DRAW IN ABDOMINALS, HOLD. Slide one leg down to straight. Return. Be sure pelvis does not rock forward, tilt, rotate, or tip to side. Do _10__ times. Restabilize pelvis. Repeat with other leg. Do __1-2_ sets, __2_ times per day.     Bridge    Lie back, legs bent. Inhale, pressing hips up. Keeping ribs in, lengthen lower back. Exhale, rolling down along spine from top. Repeat __10-20 __ times. Do __2__ sessions per day.

## 2016-07-02 DIAGNOSIS — M542 Cervicalgia: Secondary | ICD-10-CM | POA: Diagnosis not present

## 2016-07-03 ENCOUNTER — Ambulatory Visit: Payer: Medicare Other | Admitting: Physical Therapy

## 2016-07-03 DIAGNOSIS — R262 Difficulty in walking, not elsewhere classified: Secondary | ICD-10-CM | POA: Diagnosis not present

## 2016-07-03 DIAGNOSIS — M5441 Lumbago with sciatica, right side: Secondary | ICD-10-CM | POA: Diagnosis not present

## 2016-07-03 DIAGNOSIS — M6281 Muscle weakness (generalized): Secondary | ICD-10-CM | POA: Diagnosis not present

## 2016-07-03 DIAGNOSIS — M62830 Muscle spasm of back: Secondary | ICD-10-CM
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
## 2016-07-05 ENCOUNTER — Ambulatory Visit: Payer: Medicare Other | Admitting: Physical Therapy

## 2016-07-05 DIAGNOSIS — M6281 Muscle weakness (generalized): Secondary | ICD-10-CM

## 2016-07-05 DIAGNOSIS — R262 Difficulty in walking, not elsewhere classified: Secondary | ICD-10-CM

## 2016-07-05 DIAGNOSIS — M5441 Lumbago with sciatica, right side: Secondary | ICD-10-CM

## 2016-07-05 DIAGNOSIS — M62830 Muscle spasm of back: Secondary | ICD-10-CM
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
## 2016-07-06 ENCOUNTER — Other Ambulatory Visit: Payer: Self-pay | Admitting: *Deleted

## 2016-07-06 DIAGNOSIS — J309 Allergic rhinitis, unspecified: Secondary | ICD-10-CM

## 2016-07-06 MED ORDER — FLUTICASONE PROPIONATE 50 MCG/ACT NA SUSP: 2.0000 | Freq: Every day | NASAL | 11 refills | Status: DC | PRN

## 2016-07-10 ENCOUNTER — Ambulatory Visit: Payer: Medicare Other | Admitting: Physical Therapy

## 2016-07-10 ENCOUNTER — Ambulatory Visit: Payer: Medicare Other | Attending: Family Medicine | Admitting: Physical Therapy

## 2016-07-10 ENCOUNTER — Telehealth: Payer: Self-pay | Admitting: Family Medicine

## 2016-07-10 DIAGNOSIS — M5441 Lumbago with sciatica, right side: Secondary | ICD-10-CM | POA: Diagnosis not present

## 2016-07-10 DIAGNOSIS — R262 Difficulty in walking, not elsewhere classified: Secondary | ICD-10-CM | POA: Diagnosis not present

## 2016-07-10 DIAGNOSIS — M62830 Muscle spasm of back: Secondary | ICD-10-CM | POA: Diagnosis not present

## 2016-07-10 DIAGNOSIS — M6281 Muscle weakness (generalized): Secondary | ICD-10-CM | POA: Diagnosis not present

## 2016-07-10 DIAGNOSIS — J309 Allergic rhinitis, unspecified: Secondary | ICD-10-CM

## 2016-07-10 MED ORDER — FLUTICASONE PROPIONATE 50 MCG/ACT NA SUSP: 2.0000 | Freq: Every day | NASAL | 11 refills | Status: DC | PRN

## 2016-07-10 NOTE — Telephone Encounter (Signed)
Pt called and was very upset that his flonase has not been called in to [facility] on [address]. Please call pt after it has been called in. Thanks! ep

## 2016-07-10 NOTE — Telephone Encounter (Signed)
Medication refill for Flonase was approved 07/06/16 and sent to [facility] pharmacy.  Patient requested that Rx [facility].  [facility] to [facility] per patient request.  Clovis Pu, RN
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
## 2016-07-12 ENCOUNTER — Telehealth: Payer: Self-pay | Admitting: Family Medicine

## 2016-07-12 ENCOUNTER — Ambulatory Visit: Payer: Medicare Other | Admitting: Physical Therapy

## 2016-07-12 DIAGNOSIS — M6281 Muscle weakness (generalized): Secondary | ICD-10-CM | POA: Diagnosis not present

## 2016-07-12 DIAGNOSIS — M5441 Lumbago with sciatica, right side: Secondary | ICD-10-CM | POA: Diagnosis not present

## 2016-07-12 DIAGNOSIS — R262 Difficulty in walking, not elsewhere classified: Secondary | ICD-10-CM

## 2016-07-12 DIAGNOSIS — M62830 Muscle spasm of back: Secondary | ICD-10-CM
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
## 2016-07-12 NOTE — Telephone Encounter (Signed)
Pt's pharmacy told him to call us and have the code changed so Medicare would pay for his test strips. Please call pt after it has been called in. Thanks! ep

## 2016-07-13 NOTE — Telephone Encounter (Signed)
Standardized diabetic form completed for One touch test strips.  ICD-10 code E11.9.  Signed by Dr. Pollie Meyer.  Form faxed back to [facility].  Clovis Pu, RN

## 2016-07-25 ENCOUNTER — Emergency Department (HOSPITAL_COMMUNITY)
Admission: EM | Admit: 2016-07-25 | Discharge: 2016-07-25 | Disposition: A | Discharge status: Home / Self Care | Payer: Medicare Other | Attending: Emergency Medicine | Admitting: Emergency Medicine

## 2016-07-25 ENCOUNTER — Ambulatory Visit: Payer: Medicare Other | Admitting: Physical Therapy

## 2016-07-25 ENCOUNTER — Encounter (HOSPITAL_COMMUNITY): Payer: Self-pay | Admitting: *Deleted

## 2016-07-25 DIAGNOSIS — M62830 Muscle spasm of back: Secondary | ICD-10-CM | POA: Diagnosis not present

## 2016-07-25 DIAGNOSIS — Z7982 Long term (current) use of aspirin: Secondary | ICD-10-CM | POA: Diagnosis not present

## 2016-07-25 DIAGNOSIS — R262 Difficulty in walking, not elsewhere classified: Secondary | ICD-10-CM | POA: Diagnosis not present

## 2016-07-25 DIAGNOSIS — R1013 Epigastric pain: Secondary | ICD-10-CM

## 2016-07-25 DIAGNOSIS — R109 Unspecified abdominal pain: Secondary | ICD-10-CM | POA: Diagnosis present

## 2016-07-25 DIAGNOSIS — M6281 Muscle weakness (generalized): Secondary | ICD-10-CM | POA: Diagnosis not present

## 2016-07-25 DIAGNOSIS — I10 Essential (primary) hypertension: Secondary | ICD-10-CM | POA: Insufficient documentation

## 2016-07-25 DIAGNOSIS — Z79899 Other long term (current) drug therapy: Secondary | ICD-10-CM | POA: Diagnosis not present

## 2016-07-25 DIAGNOSIS — Z7984 Long term (current) use of oral hypoglycemic drugs: Secondary | ICD-10-CM | POA: Insufficient documentation

## 2016-07-25 DIAGNOSIS — E119 Type 2 diabetes mellitus without complications: Secondary | ICD-10-CM | POA: Insufficient documentation

## 2016-07-25 DIAGNOSIS — M5441 Lumbago with sciatica, right side: Secondary | ICD-10-CM

## 2016-07-25 DIAGNOSIS — F17210 Nicotine dependence, cigarettes, uncomplicated: Secondary | ICD-10-CM | POA: Insufficient documentation

## 2016-07-25 DIAGNOSIS — Z96612 Presence of left artificial shoulder joint: Secondary | ICD-10-CM | POA: Insufficient documentation

## 2016-07-25 HISTORY — DX: Essential (primary) hypertension: I10

## 2016-07-25 LAB — COMPREHENSIVE METABOLIC PANEL
ALT: 19 U/L (ref 17–63)
AST: 22 U/L (ref 15–41)
Albumin: 4.5 g/dL (ref 3.5–5.0)
Alkaline Phosphatase: 68 U/L (ref 38–126)
Anion gap: 7 (ref 5–15)
BUN: 11 mg/dL (ref 6–20)
CO2: 30 mmol/L (ref 22–32)
Calcium: 10 mg/dL (ref 8.9–10.3)
Chloride: 98 mmol/L — ABNORMAL LOW (ref 101–111)
Creatinine, Ser: 1.01 mg/dL (ref 0.61–1.24)
GFR calc Af Amer: >60 mL/min (ref >60)
GFR calc non Af Amer: >60 mL/min (ref >60)
Glucose, Bld: 162 mg/dL — ABNORMAL HIGH (ref 65–99)
Potassium: 3.8 mmol/L (ref 3.5–5.1)
Sodium: 135 mmol/L (ref 135–145)
Total Bilirubin: 0.5 mg/dL (ref 0.3–1.2)
Total Protein: 8.2 g/dL — ABNORMAL HIGH (ref 6.5–8.1)

## 2016-07-25 LAB — URINALYSIS, ROUTINE W REFLEX MICROSCOPIC
Bilirubin Urine: NEGATIVE
Glucose, UA: NEGATIVE mg/dL
Hgb urine dipstick: NEGATIVE
Ketones, ur: NEGATIVE mg/dL
Leukocytes, UA: NEGATIVE
Nitrite: NEGATIVE
Protein, ur: NEGATIVE mg/dL
Specific Gravity, Urine: 1.029 (ref 1.005–1.030)
pH: 6 (ref 5.0–8.0)

## 2016-07-25 LAB — CBC
HCT: 44.9 % (ref 39.0–52.0)
Hemoglobin: 15.4 g/dL (ref 13.0–17.0)
MCH: 28.6 pg (ref 26.0–34.0)
MCHC: 34.3 g/dL (ref 30.0–36.0)
MCV: 83.3 fL (ref 78.0–100.0)
Platelets: 276 10*3/uL (ref 150–400)
RBC: 5.39 MIL/uL (ref 4.22–5.81)
RDW: 13.5 % (ref 11.5–15.5)
WBC: 5.3 10*3/uL (ref 4.0–10.5)

## 2016-07-25 LAB — LIPASE, BLOOD: Lipase: 51 U/L (ref 11–51)

## 2016-07-25 MED ORDER — FAMOTIDINE 20 MG PO TABS: 20.0000 mg | ORAL_TABLET | Freq: Two times a day (BID) | ORAL | 0 refills | Status: DC

## 2016-07-25 MED ORDER — FAMOTIDINE 20 MG PO TABS
20.0000 mg | ORAL_TABLET | Freq: Once | ORAL | Status: AC
  Administered 2016-07-25: 20 mg via ORAL
  Filled 2016-07-25: qty 1

## 2016-07-25 MED ORDER — GI COCKTAIL ~~LOC~~
30.0000 mL | Freq: Once | ORAL | Status: AC
  Administered 2016-07-25: 30 mL via ORAL
  Filled 2016-07-25: qty 30

## 2016-07-25 NOTE — ED Triage Notes (Signed)
Pt reports right side abd pain for one week. Having nausea and pain with urination. Denies fever.
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
## 2016-07-27 ENCOUNTER — Ambulatory Visit: Payer: Medicare Other | Admitting: Physical Therapy

## 2016-07-27 DIAGNOSIS — M62830 Muscle spasm of back: Secondary | ICD-10-CM | POA: Diagnosis not present

## 2016-07-27 DIAGNOSIS — M6281 Muscle weakness (generalized): Secondary | ICD-10-CM | POA: Diagnosis not present

## 2016-07-27 DIAGNOSIS — M5441 Lumbago with sciatica, right side: Secondary | ICD-10-CM

## 2016-07-27 DIAGNOSIS — R262 Difficulty in walking, not elsewhere classified: Secondary | ICD-10-CM | POA: Diagnosis not present
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
## 2016-07-30 ENCOUNTER — Ambulatory Visit: Payer: Medicare Other | Admitting: Physical Therapy

## 2016-07-30 DIAGNOSIS — M62830 Muscle spasm of back: Secondary | ICD-10-CM | POA: Diagnosis not present

## 2016-07-30 DIAGNOSIS — R262 Difficulty in walking, not elsewhere classified: Secondary | ICD-10-CM

## 2016-07-30 DIAGNOSIS — M6281 Muscle weakness (generalized): Secondary | ICD-10-CM

## 2016-07-30 DIAGNOSIS — M5441 Lumbago with sciatica, right side: Secondary | ICD-10-CM | POA: Diagnosis not present
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
## 2016-08-01 ENCOUNTER — Ambulatory Visit: Payer: Medicare Other | Admitting: Physical Therapy

## 2016-08-01 DIAGNOSIS — M62830 Muscle spasm of back: Secondary | ICD-10-CM | POA: Diagnosis not present

## 2016-08-01 DIAGNOSIS — R262 Difficulty in walking, not elsewhere classified: Secondary | ICD-10-CM

## 2016-08-01 DIAGNOSIS — M6281 Muscle weakness (generalized): Secondary | ICD-10-CM

## 2016-08-01 DIAGNOSIS — M5441 Lumbago with sciatica, right side: Secondary | ICD-10-CM
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
## 2016-08-02 DIAGNOSIS — M542 Cervicalgia: Secondary | ICD-10-CM | POA: Diagnosis not present

## 2016-08-03 ENCOUNTER — Other Ambulatory Visit: Payer: Self-pay | Admitting: *Deleted

## 2016-08-06 ENCOUNTER — Encounter: Payer: Self-pay | Admitting: Physical Therapy

## 2016-08-06 DIAGNOSIS — R933 Abnormal findings on diagnostic imaging of other parts of digestive tract: Secondary | ICD-10-CM | POA: Diagnosis not present

## 2016-08-06 DIAGNOSIS — R1011 Right upper quadrant pain: Secondary | ICD-10-CM | POA: Diagnosis not present

## 2016-08-06 DIAGNOSIS — R1031 Right lower quadrant pain: Secondary | ICD-10-CM | POA: Diagnosis not present

## 2016-08-07 ENCOUNTER — Other Ambulatory Visit: Payer: Self-pay | Admitting: *Deleted

## 2016-08-07 ENCOUNTER — Encounter (HOSPITAL_COMMUNITY): Payer: Self-pay | Admitting: *Deleted

## 2016-08-07 ENCOUNTER — Other Ambulatory Visit: Payer: Self-pay | Admitting: Gastroenterology

## 2016-08-07 MED ORDER — CETIRIZINE HCL 10 MG PO TBDP: 1.0000 | ORAL_TABLET | Freq: Every day | ORAL | 2 refills | Status: DC

## 2016-08-07 MED ORDER — ESOMEPRAZOLE MAGNESIUM 20 MG PO CPDR: 20.0000 mg | DELAYED_RELEASE_CAPSULE | Freq: Every day | ORAL | 1 refills | Status: DC

## 2016-08-08 ENCOUNTER — Ambulatory Visit: Payer: Medicare Other | Admitting: Physical Therapy

## 2016-08-09 ENCOUNTER — Ambulatory Visit: Payer: Medicare Other | Admitting: Physical Therapy
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
## 2016-08-10 ENCOUNTER — Ambulatory Visit (HOSPITAL_COMMUNITY): Payer: Medicare Other | Admitting: Anesthesiology

## 2016-08-10 ENCOUNTER — Encounter (HOSPITAL_COMMUNITY)
Admission: RE | Disposition: A | Discharge status: Home / Self Care | Payer: Self-pay | Source: Ambulatory Visit | Attending: Gastroenterology

## 2016-08-10 ENCOUNTER — Ambulatory Visit (HOSPITAL_COMMUNITY)
Admission: RE | Admit: 2016-08-10 | Discharge: 2016-08-10 | Disposition: A | Discharge status: Home / Self Care | Payer: Medicare Other | Source: Ambulatory Visit | Attending: Gastroenterology | Admitting: Gastroenterology

## 2016-08-10 ENCOUNTER — Encounter (HOSPITAL_COMMUNITY): Payer: Self-pay

## 2016-08-10 DIAGNOSIS — F17210 Nicotine dependence, cigarettes, uncomplicated: Secondary | ICD-10-CM | POA: Diagnosis not present

## 2016-08-10 DIAGNOSIS — Z9049 Acquired absence of other specified parts of digestive tract: Secondary | ICD-10-CM | POA: Diagnosis not present

## 2016-08-10 DIAGNOSIS — R1011 Right upper quadrant pain: Secondary | ICD-10-CM | POA: Diagnosis not present

## 2016-08-10 DIAGNOSIS — Z96612 Presence of left artificial shoulder joint: Secondary | ICD-10-CM | POA: Insufficient documentation

## 2016-08-10 DIAGNOSIS — E119 Type 2 diabetes mellitus without complications: Secondary | ICD-10-CM | POA: Diagnosis not present

## 2016-08-10 DIAGNOSIS — R933 Abnormal findings on diagnostic imaging of other parts of digestive tract: Secondary | ICD-10-CM | POA: Diagnosis not present

## 2016-08-10 DIAGNOSIS — M1990 Unspecified osteoarthritis, unspecified site: Secondary | ICD-10-CM | POA: Diagnosis not present

## 2016-08-10 DIAGNOSIS — I10 Essential (primary) hypertension: Secondary | ICD-10-CM | POA: Insufficient documentation

## 2016-08-10 DIAGNOSIS — R109 Unspecified abdominal pain: Secondary | ICD-10-CM | POA: Diagnosis present

## 2016-08-10 HISTORY — PX: EUS: SHX5427

## 2016-08-10 LAB — GLUCOSE, CAPILLARY: GLUCOSE-CAPILLARY: 165 mg/dL — AB (ref 65–99)

## 2016-08-10 SURGERY — UPPER ENDOSCOPIC ULTRASOUND (EUS) LINEAR
Anesthesia: Monitor Anesthesia Care

## 2016-08-10 MED ORDER — KETAMINE HCL 10 MG/ML IJ SOLN
INTRAMUSCULAR | Status: DC | PRN
Start: 2016-08-10 — End: 2016-08-10
  Administered 2016-08-10 (×2): 10 mg via INTRAVENOUS

## 2016-08-10 MED ORDER — PROPOFOL 10 MG/ML IV BOLUS
INTRAVENOUS | Status: DC | PRN
  Administered 2016-08-10: 30 mg via INTRAVENOUS
  Administered 2016-08-10: 10 mg via INTRAVENOUS
  Administered 2016-08-10: 50 mg via INTRAVENOUS
  Administered 2016-08-10: 10 mg via INTRAVENOUS

## 2016-08-10 MED ORDER — SODIUM CHLORIDE 0.9 % IV SOLN: INTRAVENOUS | Status: DC

## 2016-08-10 MED ORDER — PROPOFOL 500 MG/50ML IV EMUL
INTRAVENOUS | Status: DC | PRN
  Administered 2016-08-10: 200 ug/kg/min via INTRAVENOUS

## 2016-08-10 MED ORDER — LIDOCAINE 2% (20 MG/ML) 5 ML SYRINGE
INTRAMUSCULAR | Status: AC
  Filled 2016-08-10: qty 5

## 2016-08-10 MED ORDER — LIDOCAINE 2% (20 MG/ML) 5 ML SYRINGE
INTRAMUSCULAR | Status: DC | PRN
  Administered 2016-08-10: 50 mg via INTRAVENOUS

## 2016-08-10 MED ORDER — PROPOFOL 10 MG/ML IV BOLUS
INTRAVENOUS | Status: AC
  Filled 2016-08-10: qty 40

## 2016-08-10 MED ORDER — KETAMINE HCL 10 MG/ML IJ SOLN
INTRAMUSCULAR | Status: AC
Start: 2016-08-10 — End: 2016-08-10
  Filled 2016-08-10: qty 1

## 2016-08-10 MED ORDER — LACTATED RINGERS IV SOLN
INTRAVENOUS | Status: DC | PRN
  Administered 2016-08-10: 11:00:00 via INTRAVENOUS

## 2016-08-10 NOTE — Anesthesia Preprocedure Evaluation (Signed)
Anesthesia Evaluation  Patient identified by MRN, date of birth, ID band Patient awake    Reviewed: Allergy & Precautions, H&P , Patient's Chart, lab work & pertinent test results, reviewed documented beta blocker date and time   Airway Mallampati: II  TM Distance: >3 FB Neck ROM: full    Dental no notable dental hx.    Pulmonary Current Smoker,    Pulmonary exam normal breath sounds clear to auscultation       Cardiovascular hypertension, On Medications  Rhythm:regular Rate:Normal     Neuro/Psych    GI/Hepatic   Endo/Other  diabetes  Renal/GU      Musculoskeletal   Abdominal   Peds  Hematology   Anesthesia Other Findings   Reproductive/Obstetrics                             Anesthesia Physical Anesthesia Plan  ASA: II  Anesthesia Plan: MAC   Post-op Pain Management:    Induction: Intravenous  Airway Management Planned: Mask and Natural Airway  Additional Equipment:   Intra-op Plan:   Post-operative Plan:   Informed Consent: I have reviewed the patients History and Physical, chart, labs and discussed the procedure including the risks, benefits and alternatives for the proposed anesthesia with the patient or authorized representative who has indicated his/her understanding and acceptance.   Dental Advisory Given  Plan Discussed with: CRNA and Surgeon  Anesthesia Plan Comments: (Discussed sedation and potential to need to place airway or ETT if warranted by clinical changes intra-operatively. We will start procedure as MAC.)        Anesthesia Quick Evaluation

## 2016-08-10 NOTE — Discharge Instructions (Signed)
YOU HAD AN ENDOSCOPIC PROCEDURE TODAY: Refer to the procedure report and other information in the discharge instructions given to you for any specific questions about what was found during the examination. If this information does not answer your questions, please call [facility] at [phone] to clarify.  ° °YOU SHOULD EXPECT: Some feelings of bloating in the abdomen. Passage of more gas than usual. Walking can help get rid of the air that was put into your GI tract during the procedure and reduce the bloating. If you had a lower endoscopy (such as a colonoscopy or flexible sigmoidoscopy) you may notice spotting of blood in your stool or on the toilet paper. Some abdominal soreness may be present for a day or two, also. ° °DIET: Your first meal following the procedure should be a light meal and then it is ok to progress to your normal diet. A half-sandwich or bowl of soup is an example of a good first meal. Heavy or fried foods are harder to digest and may make you feel nauseous or bloated. Drink plenty of fluids but you should avoid alcoholic beverages for 24 hours. If you had an esophageal dilation, please see attached information for diet.  ° °ACTIVITY: Your care partner should take you home directly after the procedure. You should plan to take it easy, moving slowly for the rest of the day. You can resume normal activity the day after the procedure however YOU SHOULD NOT DRIVE, use power tools, machinery or perform tasks that involve climbing or major physical exertion for 24 hours (because of the sedation medicines used during the test).  ° °SYMPTOMS TO REPORT IMMEDIATELY: °A gastroenterologist can be reached at any hour. Please call [phone]  for any of the following symptoms:  °Following lower endoscopy (colonoscopy, flexible sigmoidoscopy) °Excessive amounts of blood in the stool  °Significant tenderness, worsening of abdominal pains  °Swelling of the abdomen that is new, acute  °Fever of  100° or higher  °Following upper endoscopy (EGD, EUS, ERCP, esophageal dilation) °Vomiting of blood or coffee ground material  °New, significant abdominal pain  °New, significant chest pain or pain under the shoulder blades  °Painful or persistently difficult swallowing  °New shortness of breath  °Black, tarry-looking or red, bloody stools ° °FOLLOW UP:  °If any biopsies were taken you will be contacted by phone or by letter within the next 1-3 weeks. Call [phone]  if you have not heard about the biopsies in 3 weeks.  °Please also call with any specific questions about appointments or follow up tests. ° °

## 2016-08-10 NOTE — Op Note (Signed)
[facility] Patient Name: Brian Dominguez Procedure Date: 08/10/2016 MRN: [id number] Attending MD: Jeani Hawking , MD Date of Birth: [birth year] CSN: [id number] Age: 70 Admit Type: Outpatient Procedure:                Upper EUS Indications:              Suspected mass in pancreas on CT scan, Abdominal                            pain in the right upper quadrant Providers:                Jeani Hawking, MD, Omelia Blackwater RN, RN,                            Kandice Robinsons, Technician Referring MD:              Medicines:                Propofol per Anesthesia Complications:            No immediate complications. Estimated Blood Loss:     Estimated blood loss: none. Procedure:                Pre-Anesthesia Assessment:                           - Prior to the procedure, a History and Physical                            was performed, and patient medications and                            allergies were reviewed. The patient's tolerance of                            previous anesthesia was also reviewed. The risks                            and benefits of the procedure and the sedation                            options and risks were discussed with the patient.                            All questions were answered, and informed consent                            was obtained. Prior Anticoagulants: The patient has                            taken no previous anticoagulant or antiplatelet                            agents. ASA Grade Assessment: II - A patient with  mild systemic disease. After reviewing the risks                            and benefits, the patient was deemed in                            satisfactory condition to undergo the procedure.                           - Sedation was administered by an anesthesia                            professional. Deep sedation was attained.                           After obtaining informed consent, the  endoscope was                            passed under direct vision. Throughout the                            procedure, the patient's blood pressure, pulse, and                            oxygen saturations were monitored continuously. The                            Endoscope was introduced through the mouth, and                            advanced to the second part of duodenum. The upper                            EUS was accomplished without difficulty. The                            patient tolerated the procedure well. Scope In: Scope Out: Findings:      Endoscopic Finding :      The examined esophagus was endoscopically normal.      The entire examined stomach was endoscopically normal.      The examined duodenum was endoscopically normal.      Endosonographic Finding :      There was no sign of significant endosonographic abnormality in the       entire pancreas. The pancreatic duct measured up to 3 mm in diameter in       the head of the pancreas. No masses, no cysts, the pancreatic duct was       thin in caliber, the pancreatic duct was regular in contour.      Evidence of a previous cholecystectomy was identified       endosonographically. The CBD measured 4.3 mm. No evidence of any stones,       masses, sludge, or strictures. Impression:               - Normal esophagus.                           -  Normal stomach.                           - Normal examined duodenum.                           - There was no sign of significant pathology in the                            entire pancreas.                           - Evidence of a cholecystectomy.                           - No specimens collected. Moderate Sedation:      N/A- Per Anesthesia Care Recommendation:           - Patient has a contact number available for                            emergencies. The signs and symptoms of potential                            delayed complications were discussed with the                             patient. Return to normal activities tomorrow.                            Written discharge instructions were provided to the                            patient.                           - Resume regular diet.                           - Continue with symptomatic control of pain with                            tramadol. Procedure Code(s):        --- Professional ---                           [phone], Esophagogastroduodenoscopy, flexible,                            transoral; with endoscopic ultrasound examination                            limited to the esophagus, stomach or duodenum, and                            adjacent structures Diagnosis Code(s):        --- Professional ---  R10.11, Right upper quadrant pain                           Z90.49, Acquired absence of other specified parts                            of digestive tract                           R93.3, Abnormal findings on diagnostic imaging of                            other parts of digestive tract CPT copyright 2016 [employer]. All rights reserved. The codes documented in this report are preliminary and upon coder review may  be revised to meet current compliance requirements. Jeani Hawking, MD Jeani Hawking, MD 08/10/2016 11:03:50 AM This report has been signed electronically. Number of Addenda: 0

## 2016-08-10 NOTE — Transfer of Care (Signed)
Immediate Anesthesia Transfer of Care Note  Patient: Brian JakesJohn A Dominguez  Procedure(s) Performed: Procedure(s): UPPER ENDOSCOPIC ULTRASOUND (EUS) LINEAR (N/A)  Patient Location: PACU  Anesthesia Type:MAC  Level of Consciousness: Patient easily awoken, sedated, comfortable, cooperative, following commands, responds to stimulation.   Airway & Oxygen Therapy: Patient spontaneously breathing, ventilating well, oxygen via simple oxygen mask.  Post-op Assessment: Report given to PACU RN, vital signs reviewed and stable, moving all extremities.   Post vital signs: Reviewed and stable.  Complications: No apparent anesthesia complications Last Vitals:  Vitals:   08/10/16 1015 08/10/16 1105  BP: (!) 155/71 (!) 141/74  Pulse: 85 96  Resp: 20 (!) 23  Temp: 36.6 C     Last Pain:  Vitals:   08/10/16 1105  TempSrc: Oral  PainSc:          Complications: No apparent anesthesia complications

## 2016-08-13 NOTE — Anesthesia Postprocedure Evaluation (Signed)
Anesthesia Post Note  Patient: Brian Dominguez  Procedure(s) Performed: Procedure(s) (LRB): UPPER ENDOSCOPIC ULTRASOUND (EUS) LINEAR (N/A)  Patient location during evaluation: PACU Anesthesia Type: MAC Level of consciousness: awake and alert Pain management: pain level controlled Vital Signs Assessment: post-procedure vital signs reviewed and stable Respiratory status: spontaneous breathing, nonlabored ventilation, respiratory function stable and patient connected to nasal cannula oxygen Cardiovascular status: stable and blood pressure returned to baseline Anesthetic complications: no    Last Vitals:  Vitals:   08/10/16 1015 08/10/16 1105  BP: (!) 155/71 (!) 141/74  Pulse: 85 96  Resp: 20 (!) 23  Temp: 36.6 C 36.6 C    Last Pain:  Vitals:   08/10/16 1105  TempSrc: Oral  PainSc:                  Jiles GarterJACKSON,Brian Kauffmann EDWARD

## 2016-08-14 ENCOUNTER — Encounter (HOSPITAL_COMMUNITY): Payer: Self-pay | Admitting: Gastroenterology

## 2016-08-14 ENCOUNTER — Ambulatory Visit: Payer: Medicare Other | Admitting: Physical Therapy

## 2016-08-16 ENCOUNTER — Other Ambulatory Visit: Payer: Self-pay | Admitting: Family Medicine

## 2016-08-16 ENCOUNTER — Ambulatory Visit: Payer: Medicare Other | Attending: Family Medicine | Admitting: Physical Therapy

## 2016-08-16 DIAGNOSIS — M62830 Muscle spasm of back: Secondary | ICD-10-CM | POA: Diagnosis not present

## 2016-08-16 DIAGNOSIS — R262 Difficulty in walking, not elsewhere classified: Secondary | ICD-10-CM

## 2016-08-16 DIAGNOSIS — M6281 Muscle weakness (generalized): Secondary | ICD-10-CM | POA: Diagnosis not present

## 2016-08-16 DIAGNOSIS — M5441 Lumbago with sciatica, right side: Secondary | ICD-10-CM | POA: Diagnosis not present

## 2016-08-16 DIAGNOSIS — R293 Abnormal posture: Secondary | ICD-10-CM | POA: Diagnosis not present

## 2016-08-16 MED ORDER — METFORMIN HCL 1000 MG PO TABS: 1000.0000 mg | ORAL_TABLET | Freq: Two times a day (BID) | ORAL | 2 refills | Status: DC

## 2016-08-16 NOTE — Telephone Encounter (Signed)
Needs refill on his metformin. He hasnt had any since yesterday. [facility] [address]
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
## 2016-08-20 ENCOUNTER — Ambulatory Visit: Payer: Medicare Other | Admitting: Physical Therapy

## 2016-08-20 ENCOUNTER — Encounter: Payer: Self-pay | Admitting: Physical Therapy

## 2016-08-20 DIAGNOSIS — M6281 Muscle weakness (generalized): Secondary | ICD-10-CM

## 2016-08-20 DIAGNOSIS — M62830 Muscle spasm of back: Secondary | ICD-10-CM

## 2016-08-20 DIAGNOSIS — R262 Difficulty in walking, not elsewhere classified: Secondary | ICD-10-CM

## 2016-08-20 DIAGNOSIS — M5441 Lumbago with sciatica, right side: Secondary | ICD-10-CM

## 2016-08-20 DIAGNOSIS — R293 Abnormal posture: Secondary | ICD-10-CM | POA: Diagnosis not present
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
## 2016-08-21 ENCOUNTER — Ambulatory Visit: Payer: Medicare Other | Admitting: Family Medicine

## 2016-08-22 ENCOUNTER — Ambulatory Visit: Payer: Medicare Other | Admitting: Physical Therapy

## 2016-08-22 DIAGNOSIS — R293 Abnormal posture: Secondary | ICD-10-CM | POA: Diagnosis not present

## 2016-08-22 DIAGNOSIS — M5441 Lumbago with sciatica, right side: Secondary | ICD-10-CM | POA: Diagnosis not present

## 2016-08-22 DIAGNOSIS — M62830 Muscle spasm of back: Secondary | ICD-10-CM | POA: Diagnosis not present

## 2016-08-22 DIAGNOSIS — M6281 Muscle weakness (generalized): Secondary | ICD-10-CM

## 2016-08-22 DIAGNOSIS — R262 Difficulty in walking, not elsewhere classified: Secondary | ICD-10-CM | POA: Diagnosis not present
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
## 2016-09-02 DIAGNOSIS — M542 Cervicalgia: Secondary | ICD-10-CM | POA: Diagnosis not present

## 2016-09-03 DIAGNOSIS — R262 Difficulty in walking, not elsewhere classified: Secondary | ICD-10-CM | POA: Diagnosis not present

## 2016-09-03 DIAGNOSIS — M6281 Muscle weakness (generalized): Secondary | ICD-10-CM | POA: Diagnosis not present

## 2016-09-03 DIAGNOSIS — M62830 Muscle spasm of back: Secondary | ICD-10-CM | POA: Diagnosis not present

## 2016-09-03 DIAGNOSIS — M5441 Lumbago with sciatica, right side: Secondary | ICD-10-CM | POA: Diagnosis not present

## 2016-09-03 DIAGNOSIS — R293 Abnormal posture: Secondary | ICD-10-CM | POA: Diagnosis not present

## 2016-09-11 ENCOUNTER — Other Ambulatory Visit: Payer: Self-pay | Admitting: *Deleted

## 2016-09-11 DIAGNOSIS — I10 Essential (primary) hypertension: Secondary | ICD-10-CM

## 2016-09-11 MED ORDER — HYDROCHLOROTHIAZIDE 25 MG PO TABS: 25.0000 mg | ORAL_TABLET | Freq: Every day | ORAL | 3 refills | Status: DC

## 2016-10-02 DIAGNOSIS — M542 Cervicalgia: Secondary | ICD-10-CM | POA: Diagnosis not present

## 2016-10-23 DIAGNOSIS — M545 Low back pain: Secondary | ICD-10-CM | POA: Diagnosis not present

## 2016-11-02 DIAGNOSIS — M542 Cervicalgia: Secondary | ICD-10-CM | POA: Diagnosis not present

## 2016-11-05 DIAGNOSIS — M533 Sacrococcygeal disorders, not elsewhere classified: Secondary | ICD-10-CM | POA: Diagnosis not present

## 2016-11-13 ENCOUNTER — Other Ambulatory Visit: Payer: Self-pay | Admitting: Family Medicine

## 2016-11-13 ENCOUNTER — Other Ambulatory Visit: Payer: Self-pay | Admitting: *Deleted

## 2016-11-13 MED ORDER — METFORMIN HCL 1000 MG PO TABS: 1000.0000 mg | ORAL_TABLET | Freq: Two times a day (BID) | ORAL | 2 refills | Status: DC

## 2016-11-22 DIAGNOSIS — M533 Sacrococcygeal disorders, not elsewhere classified: Secondary | ICD-10-CM | POA: Diagnosis not present

## 2016-12-02 DIAGNOSIS — M542 Cervicalgia: Secondary | ICD-10-CM | POA: Diagnosis not present

## 2016-12-23 ENCOUNTER — Emergency Department (HOSPITAL_COMMUNITY)
Admission: EM | Admit: 2016-12-23 | Discharge: 2016-12-23 | Disposition: A | Discharge status: Home / Self Care | Payer: Medicare Other | Attending: Emergency Medicine | Admitting: Emergency Medicine

## 2016-12-23 ENCOUNTER — Emergency Department (HOSPITAL_COMMUNITY): Payer: Medicare Other

## 2016-12-23 ENCOUNTER — Encounter (HOSPITAL_COMMUNITY): Payer: Self-pay | Admitting: *Deleted

## 2016-12-23 DIAGNOSIS — K5900 Constipation, unspecified: Secondary | ICD-10-CM | POA: Diagnosis not present

## 2016-12-23 DIAGNOSIS — E119 Type 2 diabetes mellitus without complications: Secondary | ICD-10-CM | POA: Insufficient documentation

## 2016-12-23 DIAGNOSIS — I10 Essential (primary) hypertension: Secondary | ICD-10-CM | POA: Diagnosis not present

## 2016-12-23 DIAGNOSIS — F17210 Nicotine dependence, cigarettes, uncomplicated: Secondary | ICD-10-CM | POA: Diagnosis not present

## 2016-12-23 DIAGNOSIS — Z7982 Long term (current) use of aspirin: Secondary | ICD-10-CM | POA: Diagnosis not present

## 2016-12-23 DIAGNOSIS — R109 Unspecified abdominal pain: Secondary | ICD-10-CM | POA: Diagnosis not present

## 2016-12-23 DIAGNOSIS — Z96612 Presence of left artificial shoulder joint: Secondary | ICD-10-CM | POA: Insufficient documentation

## 2016-12-23 DIAGNOSIS — Z7984 Long term (current) use of oral hypoglycemic drugs: Secondary | ICD-10-CM | POA: Diagnosis not present

## 2016-12-23 DIAGNOSIS — R1031 Right lower quadrant pain: Secondary | ICD-10-CM

## 2016-12-23 DIAGNOSIS — R1084 Generalized abdominal pain: Secondary | ICD-10-CM

## 2016-12-23 LAB — URINALYSIS, ROUTINE W REFLEX MICROSCOPIC
BILIRUBIN URINE: NEGATIVE
Bacteria, UA: NONE SEEN
GLUCOSE, UA: 50 mg/dL — AB
Hgb urine dipstick: NEGATIVE
KETONES UR: NEGATIVE mg/dL
NITRITE: NEGATIVE
PH: 5 (ref 5.0–8.0)
Protein, ur: NEGATIVE mg/dL
SPECIFIC GRAVITY, URINE: 1.024 (ref 1.005–1.030)

## 2016-12-23 LAB — CBC
HCT: 41.8 % (ref 39.0–52.0)
Hemoglobin: 14.7 g/dL (ref 13.0–17.0)
MCH: 28.5 pg (ref 26.0–34.0)
MCHC: 35.2 g/dL (ref 30.0–36.0)
MCV: 81 fL (ref 78.0–100.0)
PLATELETS: 294 10*3/uL (ref 150–400)
RBC: 5.16 MIL/uL (ref 4.22–5.81)
RDW: 13.5 % (ref 11.5–15.5)
WBC: 7.3 10*3/uL (ref 4.0–10.5)

## 2016-12-23 LAB — COMPREHENSIVE METABOLIC PANEL
ALK PHOS: 60 U/L (ref 38–126)
ALT: 21 U/L (ref 17–63)
AST: 25 U/L (ref 15–41)
Albumin: 4.2 g/dL (ref 3.5–5.0)
Anion gap: 8 (ref 5–15)
BILIRUBIN TOTAL: 0.6 mg/dL (ref 0.3–1.2)
BUN: 9 mg/dL (ref 6–20)
CALCIUM: 10.3 mg/dL (ref 8.9–10.3)
CO2: 27 mmol/L (ref 22–32)
Chloride: 101 mmol/L (ref 101–111)
Creatinine, Ser: 0.93 mg/dL (ref 0.61–1.24)
Glucose, Bld: 182 mg/dL — ABNORMAL HIGH (ref 65–99)
Potassium: 4 mmol/L (ref 3.5–5.1)
SODIUM: 136 mmol/L (ref 135–145)
TOTAL PROTEIN: 8 g/dL (ref 6.5–8.1)

## 2016-12-23 LAB — I-STAT TROPONIN, ED: Troponin i, poc: 0 ng/mL (ref 0.00–0.08)

## 2016-12-23 LAB — LIPASE, BLOOD: Lipase: 304 U/L — ABNORMAL HIGH (ref 11–51)

## 2016-12-23 MED ORDER — SODIUM CHLORIDE 0.9 % IV BOLUS (SEPSIS)
1000.0000 mL | Freq: Once | INTRAVENOUS | Status: AC
  Administered 2016-12-23: 1000 mL via INTRAVENOUS

## 2016-12-23 MED ORDER — POLYETHYLENE GLYCOL 3350 17 GM/SCOOP PO POWD: 17.0000 g | Freq: Every day | ORAL | 0 refills | Status: AC

## 2016-12-23 NOTE — ED Notes (Signed)
Pt extremely agitated and states he is ready to go.  Pt would like his IV out and states "I will take it out if no one else does"

## 2016-12-23 NOTE — ED Notes (Signed)
Pt verbalized understanding discharge instructions and denies any further needs or questions at this time. VS stable, ambulatory and steady gait.   

## 2016-12-23 NOTE — ED Notes (Signed)
To x-ray
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
## 2016-12-23 NOTE — ED Triage Notes (Signed)
Pt reports right side abd pain, feels like abd is bloating and reports recent palpitations. Denies n/v but does reports loose stools. ekg done at triage and no acute distress noted.

## 2016-12-31 ENCOUNTER — Emergency Department (HOSPITAL_COMMUNITY): Payer: Medicare Other

## 2016-12-31 ENCOUNTER — Emergency Department (HOSPITAL_COMMUNITY)
Admission: EM | Admit: 2016-12-31 | Discharge: 2016-12-31 | Disposition: A | Discharge status: Home / Self Care | Payer: Medicare Other | Attending: Emergency Medicine | Admitting: Emergency Medicine

## 2016-12-31 ENCOUNTER — Encounter (HOSPITAL_COMMUNITY): Payer: Self-pay | Admitting: Emergency Medicine

## 2016-12-31 DIAGNOSIS — R1031 Right lower quadrant pain: Secondary | ICD-10-CM | POA: Insufficient documentation

## 2016-12-31 DIAGNOSIS — E119 Type 2 diabetes mellitus without complications: Secondary | ICD-10-CM | POA: Diagnosis not present

## 2016-12-31 DIAGNOSIS — I10 Essential (primary) hypertension: Secondary | ICD-10-CM | POA: Insufficient documentation

## 2016-12-31 DIAGNOSIS — Z7982 Long term (current) use of aspirin: Secondary | ICD-10-CM | POA: Diagnosis not present

## 2016-12-31 DIAGNOSIS — Z96612 Presence of left artificial shoulder joint: Secondary | ICD-10-CM | POA: Diagnosis not present

## 2016-12-31 DIAGNOSIS — F17210 Nicotine dependence, cigarettes, uncomplicated: Secondary | ICD-10-CM | POA: Diagnosis not present

## 2016-12-31 DIAGNOSIS — R109 Unspecified abdominal pain: Secondary | ICD-10-CM | POA: Diagnosis not present

## 2016-12-31 LAB — URINALYSIS, ROUTINE W REFLEX MICROSCOPIC
Bilirubin Urine: NEGATIVE
Glucose, UA: 50 mg/dL — AB
Hgb urine dipstick: NEGATIVE
Ketones, ur: NEGATIVE mg/dL
Nitrite: NEGATIVE
PROTEIN: 30 mg/dL — AB
Specific Gravity, Urine: 1.031 — ABNORMAL HIGH (ref 1.005–1.030)
pH: 5 (ref 5.0–8.0)

## 2016-12-31 LAB — COMPREHENSIVE METABOLIC PANEL
ALBUMIN: 4.1 g/dL (ref 3.5–5.0)
ALK PHOS: 51 U/L (ref 38–126)
ALT: 23 U/L (ref 17–63)
AST: 26 U/L (ref 15–41)
Anion gap: 11 (ref 5–15)
BILIRUBIN TOTAL: 0.8 mg/dL (ref 0.3–1.2)
BUN: 11 mg/dL (ref 6–20)
CO2: 27 mmol/L (ref 22–32)
Calcium: 9.8 mg/dL (ref 8.9–10.3)
Chloride: 98 mmol/L — ABNORMAL LOW (ref 101–111)
Creatinine, Ser: 1 mg/dL (ref 0.61–1.24)
GFR calc Af Amer: >60 mL/min (ref >60)
GFR calc non Af Amer: >60 mL/min (ref >60)
GLUCOSE: 184 mg/dL — AB (ref 65–99)
Potassium: 4.1 mmol/L (ref 3.5–5.1)
Sodium: 136 mmol/L (ref 135–145)
TOTAL PROTEIN: 7.5 g/dL (ref 6.5–8.1)

## 2016-12-31 LAB — CBC
HEMATOCRIT: 43.3 % (ref 39.0–52.0)
Hemoglobin: 15.1 g/dL (ref 13.0–17.0)
MCH: 28.2 pg (ref 26.0–34.0)
MCHC: 34.9 g/dL (ref 30.0–36.0)
MCV: 80.9 fL (ref 78.0–100.0)
Platelets: 252 10*3/uL (ref 150–400)
RBC: 5.35 MIL/uL (ref 4.22–5.81)
RDW: 13.7 % (ref 11.5–15.5)
WBC: 5.3 10*3/uL (ref 4.0–10.5)

## 2016-12-31 LAB — LIPASE, BLOOD: Lipase: 203 U/L — ABNORMAL HIGH (ref 11–51)

## 2016-12-31 MED ORDER — ESOMEPRAZOLE MAGNESIUM 20 MG PO CPDR: 20.0000 mg | DELAYED_RELEASE_CAPSULE | Freq: Every day | ORAL | 1 refills | Status: DC

## 2016-12-31 MED ORDER — IOPAMIDOL (ISOVUE-300) INJECTION 61%
INTRAVENOUS | Status: AC
  Administered 2016-12-31: 100 mL
  Filled 2016-12-31: qty 100

## 2016-12-31 NOTE — ED Triage Notes (Signed)
Pt sts abd pain and bloating; pt sts seen here for constipation recently and feels has resolved
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
## 2017-01-02 DIAGNOSIS — M542 Cervicalgia: Secondary | ICD-10-CM | POA: Diagnosis not present

## 2017-02-02 DIAGNOSIS — M542 Cervicalgia: Secondary | ICD-10-CM | POA: Diagnosis not present

## 2017-02-06 ENCOUNTER — Encounter (HOSPITAL_COMMUNITY): Payer: Self-pay | Admitting: *Deleted

## 2017-02-06 ENCOUNTER — Emergency Department (HOSPITAL_COMMUNITY)
Admission: EM | Admit: 2017-02-06 | Discharge: 2017-02-06 | Disposition: A | Discharge status: Home / Self Care | Payer: Medicare Other | Attending: Emergency Medicine | Admitting: Emergency Medicine

## 2017-02-06 DIAGNOSIS — R197 Diarrhea, unspecified: Secondary | ICD-10-CM | POA: Diagnosis not present

## 2017-02-06 DIAGNOSIS — I10 Essential (primary) hypertension: Secondary | ICD-10-CM | POA: Insufficient documentation

## 2017-02-06 DIAGNOSIS — Z7984 Long term (current) use of oral hypoglycemic drugs: Secondary | ICD-10-CM | POA: Insufficient documentation

## 2017-02-06 DIAGNOSIS — R1011 Right upper quadrant pain: Secondary | ICD-10-CM | POA: Insufficient documentation

## 2017-02-06 DIAGNOSIS — Z79899 Other long term (current) drug therapy: Secondary | ICD-10-CM | POA: Diagnosis not present

## 2017-02-06 DIAGNOSIS — F17210 Nicotine dependence, cigarettes, uncomplicated: Secondary | ICD-10-CM | POA: Diagnosis not present

## 2017-02-06 DIAGNOSIS — Z7982 Long term (current) use of aspirin: Secondary | ICD-10-CM | POA: Insufficient documentation

## 2017-02-06 DIAGNOSIS — E119 Type 2 diabetes mellitus without complications: Secondary | ICD-10-CM | POA: Diagnosis not present

## 2017-02-06 DIAGNOSIS — R1013 Epigastric pain: Secondary | ICD-10-CM | POA: Diagnosis not present

## 2017-02-06 LAB — COMPREHENSIVE METABOLIC PANEL
ALBUMIN: 4.3 g/dL (ref 3.5–5.0)
ALK PHOS: 61 U/L (ref 38–126)
ALT: 22 U/L (ref 17–63)
ANION GAP: 9 (ref 5–15)
AST: 26 U/L (ref 15–41)
BUN: 8 mg/dL (ref 6–20)
CALCIUM: 10 mg/dL (ref 8.9–10.3)
CO2: 27 mmol/L (ref 22–32)
CREATININE: 0.97 mg/dL (ref 0.61–1.24)
Chloride: 102 mmol/L (ref 101–111)
GFR calc Af Amer: >60 mL/min (ref >60)
GFR calc non Af Amer: >60 mL/min (ref >60)
GLUCOSE: 201 mg/dL — AB (ref 65–99)
Potassium: 4 mmol/L (ref 3.5–5.1)
SODIUM: 138 mmol/L (ref 135–145)
Total Bilirubin: 0.6 mg/dL (ref 0.3–1.2)
Total Protein: 7.6 g/dL (ref 6.5–8.1)

## 2017-02-06 LAB — URINALYSIS, ROUTINE W REFLEX MICROSCOPIC
Bilirubin Urine: NEGATIVE
Glucose, UA: >=500 mg/dL — AB
Hgb urine dipstick: NEGATIVE
Ketones, ur: NEGATIVE mg/dL
Leukocytes, UA: NEGATIVE
Nitrite: NEGATIVE
PH: 5 (ref 5.0–8.0)
PROTEIN: NEGATIVE mg/dL
Specific Gravity, Urine: 1.02 (ref 1.005–1.030)

## 2017-02-06 LAB — LIPASE, BLOOD: Lipase: 43 U/L (ref 11–51)

## 2017-02-06 LAB — CBC
HCT: 42.5 % (ref 39.0–52.0)
HEMOGLOBIN: 14.6 g/dL (ref 13.0–17.0)
MCH: 27.8 pg (ref 26.0–34.0)
MCHC: 34.4 g/dL (ref 30.0–36.0)
MCV: 81 fL (ref 78.0–100.0)
Platelets: 271 10*3/uL (ref 150–400)
RBC: 5.25 MIL/uL (ref 4.22–5.81)
RDW: 13.7 % (ref 11.5–15.5)
WBC: 5 10*3/uL (ref 4.0–10.5)

## 2017-02-06 MED ORDER — SUCRALFATE 1 GM/10ML PO SUSP: 1.0000 g | Freq: Three times a day (TID) | ORAL | 0 refills | Status: DC

## 2017-02-06 MED ORDER — RANITIDINE HCL 150 MG PO TABS: 150.0000 mg | ORAL_TABLET | Freq: Two times a day (BID) | ORAL | 0 refills | Status: DC

## 2017-02-06 MED ORDER — ESOMEPRAZOLE MAGNESIUM 20 MG PO CPDR: 40.0000 mg | DELAYED_RELEASE_CAPSULE | Freq: Every day | ORAL | 1 refills | Status: DC

## 2017-02-06 MED ORDER — LOPERAMIDE HCL 2 MG PO CAPS
2.0000 mg | ORAL_CAPSULE | Freq: Four times a day (QID) | ORAL | 0 refills | Status: DC | PRN
Start: 2017-02-06 — End: 2017-02-06

## 2017-02-06 MED ORDER — DICYCLOMINE HCL 20 MG PO TABS: 20.0000 mg | ORAL_TABLET | Freq: Two times a day (BID) | ORAL | 0 refills | Status: DC

## 2017-02-06 MED ORDER — LOPERAMIDE HCL 2 MG PO CAPS: 2.0000 mg | ORAL_CAPSULE | Freq: Four times a day (QID) | ORAL | 0 refills | Status: DC | PRN

## 2017-02-06 MED ORDER — METOCLOPRAMIDE HCL 5 MG/5ML PO SOLN: 10.0000 mg | Freq: Three times a day (TID) | ORAL | 0 refills | Status: DC

## 2017-02-06 MED ORDER — GI COCKTAIL ~~LOC~~
30.0000 mL | Freq: Once | ORAL | Status: AC
  Administered 2017-02-06: 30 mL via ORAL
  Filled 2017-02-06: qty 30
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
## 2017-02-06 NOTE — Discharge Instructions (Signed)
The dose of your reflux medicine (esomeprazole) has been increased from 20 mg to 40 mg daily.  You have also been prescribed a second medicine for reflux (Ranitidine) and a liquid medication for reflux symptoms associated with food intake (sucralfate).  Please take these medication as prescribed.  Please monitor your diet to determine specific foods that may cause flare of your reflux symptoms.   Your abdominal pain may be due to irritable bowel syndrome or gastroparesis, please read the attached information on these conditions.  Make sure you drink enough water and control your blood sugar levels. Please follow up with GI doctor as he may add or change some of the medicines you are on to better manage your symptoms.   Please take metoclopramide (Reglan) as prescribed for abdominal pain related to gastroparesis.

## 2017-02-06 NOTE — ED Triage Notes (Signed)
Pt reports right side abd pain for several days, reports diarrhea and possible fever. Hx of diverticulitis.

## 2017-02-06 NOTE — ED Notes (Signed)
Gave pt food for PO challenge. Will reassess

## 2017-02-13 DIAGNOSIS — R1011 Right upper quadrant pain: Secondary | ICD-10-CM | POA: Diagnosis not present

## 2017-02-13 DIAGNOSIS — K219 Gastro-esophageal reflux disease without esophagitis: Secondary | ICD-10-CM | POA: Diagnosis not present

## 2017-02-18 ENCOUNTER — Other Ambulatory Visit: Payer: Self-pay | Admitting: Family Medicine

## 2017-03-02 DIAGNOSIS — M542 Cervicalgia: Secondary | ICD-10-CM | POA: Diagnosis not present

## 2017-03-13 DIAGNOSIS — R1011 Right upper quadrant pain: Secondary | ICD-10-CM | POA: Diagnosis not present

## 2017-03-13 DIAGNOSIS — R140 Abdominal distension (gaseous): Secondary | ICD-10-CM | POA: Diagnosis not present

## 2017-03-15 ENCOUNTER — Ambulatory Visit (INDEPENDENT_AMBULATORY_CARE_PROVIDER_SITE_OTHER): Payer: Medicare Other | Admitting: Family Medicine

## 2017-03-15 ENCOUNTER — Encounter: Payer: Self-pay | Admitting: Family Medicine

## 2017-03-15 VITALS — BP 158/90 | HR 100 | Temp 97.3°F | Ht 73.0 in | Wt 237.6 lb

## 2017-03-15 DIAGNOSIS — E119 Type 2 diabetes mellitus without complications: Secondary | ICD-10-CM

## 2017-03-15 DIAGNOSIS — R3915 Urgency of urination: Secondary | ICD-10-CM | POA: Diagnosis not present

## 2017-03-15 DIAGNOSIS — R300 Dysuria: Secondary | ICD-10-CM

## 2017-03-15 LAB — POCT URINALYSIS DIP (MANUAL ENTRY)
Bilirubin, UA: NEGATIVE
Blood, UA: NEGATIVE
Glucose, UA: 100 — AB
Leukocytes, UA: NEGATIVE
Nitrite, UA: NEGATIVE
Protein Ur, POC: NEGATIVE
Urobilinogen, UA: 0.2 (ref ?–2.0)
pH, UA: 5.5 (ref 5.0–8.0)

## 2017-03-15 LAB — POCT GLYCOSYLATED HEMOGLOBIN (HGB A1C): Hemoglobin A1C: 8.1

## 2017-03-15 MED ORDER — TAMSULOSIN HCL 0.4 MG PO CAPS: 0.4000 mg | ORAL_CAPSULE | Freq: Every day | ORAL | 0 refills | Status: DC

## 2017-03-15 NOTE — Progress Notes (Signed)
Subjective: CC:UTI HPI: Patient is a 71 y.o. male with a past medical history of BPH, ED, right sided abdominal pain (followed by Dr. Elnoria Howard) presenting to clinic today for a same day appt due to dysuria.  Patient notes dysuria for the last 2 weeks. Feels it has progressively getting worse. No urinary frequency however he does note nocturia. He states he has a strong urine stream if he drinks a lot of water. Last saw urology several years ago, intermittently taking Flomax (hasn't been prescribed this in a while). No suprapubic pain but notes stable right sided abdominal pain for over 1 year.   Normal appetite. Has baseline nausea, no vomiting. No fevers or chills.  No penile discharge or lesions. No pain of the scrotum. No concerns for STDS (appears to be insulted that I asked).   Social History: current smoker  ROS: All other systems reviewed and are negative.  Past Medical History Patient Active Problem List   Diagnosis Date Noted  . Dysuria 03/15/2017  . Erectile dysfunction 11/16/2015  . Radiculopathy 06/15/2015  . Back pain 05/27/2015  . Allergic rhinitis 05/26/2015  . Abdominal pain 11/25/2014  . Glenohumeral arthritis 07/08/2014  . hyperlipidemia 03/26/2014  . Polyuria 03/25/2014  . Acrochordon 11/26/2013  . Chronic prostatitis 06/09/2013  . Shoulder pain 07/29/2011  . 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 BPH (benign prostatic hyperplasia) 07/21/2010  . DM type 2 (diabetes mellitus, type 2) (HCC) 07/23/2007  . Esophageal reflux 07/23/2007  . OBESITY, NOS 02/06/2007  . DEPRESSION, MAJOR, RECURRENT 02/06/2007  . ANXIETY 02/06/2007  . Tobacco abuse counseling 02/06/2007  . HYPERTENSION, BENIGN SYSTEMIC 02/06/2007  . RHINITIS, ALLERGIC 02/06/2007  . OSTEOARTHRITIS, MULTI SITES 02/06/2007    Medications- reviewed and updated Current Outpatient Prescriptions  Medication Sig Dispense Refill  . amLODipine (NORVASC) 10 MG tablet Take 0.5 tablets (5 mg total) by  mouth daily. (Patient not taking: Reported on 12/31/2016) 30 tablet 2  . aspirin EC 81 MG tablet Take 81 mg by mouth daily.    Marland Kitchen atorvastatin (LIPITOR) 40 MG tablet Take 1 tablet (40 mg total) by mouth daily. 90 tablet 3  . cetirizine (ZYRTEC) 10 MG tablet TAKE 1 TABLET BY MOUTH AT BEDTIME 30 tablet 1  . esomeprazole (NEXIUM) 20 MG capsule Take 2 capsules (40 mg total) by mouth daily. 30 capsule 1  . fluticasone (FLONASE) 50 MCG/ACT nasal spray Place 2 sprays into both nostrils daily as needed for allergies or rhinitis. 16 g 11  . hydrochlorothiazide (HYDRODIURIL) 25 MG tablet Take 1 tablet (25 mg total) by mouth daily. 90 tablet 3  . loperamide (IMODIUM) 2 MG capsule Take 1 capsule (2 mg total) by mouth 4 (four) times daily as needed for diarrhea or loose stools. 12 capsule 0  . magnesium citrate SOLN Take 296 mLs (1 Bottle total) by mouth once. 195 mL 0  . metFORMIN (GLUCOPHAGE) 1000 MG tablet TAKE 1 TABLET (1,000 MG TOTAL) BY MOUTH 2 (TWO) TIMES DAILY WITH A MEAL. 60 tablet 2  . metoCLOPramide (REGLAN) 5 MG/5ML solution Take 10 mLs (10 mg total) by mouth 4 (four) times daily -  before meals and at bedtime. 120 mL 0  . ranitidine (ZANTAC) 150 MG tablet Take 1 tablet (150 mg total) by mouth 2 (two) times daily. 60 tablet 0  . sennosides-docusate sodium (SENOKOT-S) 8.6-50 MG tablet Take 1-2 tablets by mouth daily. 30 tablet 0  . sucralfate (CARAFATE) 1 GM/10ML suspension Take 10 mLs (1 g total) by  mouth 4 (four) times daily -  with meals and at bedtime. 420 mL 0  . tamsulosin (FLOMAX) 0.4 MG CAPS capsule Take 1 capsule (0.4 mg total) by mouth daily. 30 capsule 0   No current facility-administered medications for this visit.     Objective: Office vital signs reviewed. BP (!) 158/90   Pulse 100   Temp 97.3 F (36.3 C) (Oral)   Ht  (1.854 m)   Wt 237 lb 9.6 oz (107.8 kg)   SpO2 98%   BMI 31.35 kg/m    Physical Examination:  General: Awake, alert, well- nourished, NAD Cardio: RRR, no  m/r/g noted.  Pulm: No increased WOB.  CTAB, without wheezes, rhonchi or crackles noted.  GI: Soft, non-distended, reports mild pain with palpation over RLQ but no tenderness elicited on exam. No rebound or guarding.  GU: deffered Prostate exam performed with chaperone: mild diffuse enlargement, not boggy, no prominent nodules, no tenderness/pain elicited.   Urinalysis    Component Value Date/Time   COLORURINE YELLOW 02/06/2017 1028   APPEARANCEUR CLEAR 02/06/2017 1028   LABSPEC 1.020 02/06/2017 1028   PHURINE 5.0 02/06/2017 1028   GLUCOSEU >=500 (A) 02/06/2017 1028   HGBUR NEGATIVE 02/06/2017 1028   HGBUR negative 03/09/2008 0815   BILIRUBINUR negative 03/15/2017 0949   BILIRUBINUR NEG 05/18/2016 0852   KETONESUR trace (5) (A) 03/15/2017 0949   KETONESUR NEGATIVE 02/06/2017 1028   PROTEINUR negative 03/15/2017 0949   PROTEINUR NEGATIVE 02/06/2017 1028   UROBILINOGEN 0.2 03/15/2017 0949   UROBILINOGEN 0.2 10/11/2015 1020   NITRITE Negative 03/15/2017 0949   NITRITE NEGATIVE 02/06/2017 1028   LEUKOCYTESUR Negative 03/15/2017 0949      Assessment/Plan: Dysuria Patient presenting with dysuria. U/A not consistent with infection. No tenderness on prostate exam. Per EMR, he's had dysuria in the past that did not resolve with 6 weeks of abx and there was concern for chronic prostatitis. No red flags on exam or history. May be chronic prostatitis vs interstitial cystitis vs irritant.  - referred to urology for further evaluation, may require a cystoscopy. - pt frustrated that antibiotics were not prescribed today, noted that he doesn't have an infection and it wouldn't be helpful. Discussed plan. He left without an AVS and he states he's "seen doctors in the past that didn't do anything."    Orders Placed This Encounter  Procedures  . Ambulatory referral to Urology    Referral Priority:   Routine    Referral Type:   Consultation    Referral Reason:   Specialty Services Required     Requested Specialty:   Urology    Number of Visits Requested:   1  . POCT urinalysis dipstick  . POCT glycosylated hemoglobin (Hb A1C)    Meds ordered this encounter  Medications  . tamsulosin (FLOMAX) 0.4 MG CAPS capsule    Sig: Take 1 capsule (0.4 mg total) by mouth daily.    Dispense:  30 capsule    Refill:  0    Joanna Puff PGY-3, [facility]

## 2017-03-15 NOTE — Assessment & Plan Note (Addendum)
Patient presenting with dysuria. U/A not consistent with infection. No tenderness on prostate exam. Per EMR, he's had dysuria in the past that did not resolve with 6 weeks of abx and there was concern for chronic prostatitis. No red flags on exam or history. May be chronic prostatitis vs interstitial cystitis vs irritant.  - referred to urology for further evaluation, may require a cystoscopy. - pt frustrated that antibiotics were not prescribed today, noted that he doesn't have an infection and it wouldn't be helpful. Discussed plan. He left without an AVS and he states he's "seen doctors in the past that didn't do anything."

## 2017-03-25 ENCOUNTER — Encounter (HOSPITAL_COMMUNITY): Payer: Self-pay | Admitting: Emergency Medicine

## 2017-03-25 ENCOUNTER — Other Ambulatory Visit: Payer: Self-pay

## 2017-03-25 ENCOUNTER — Emergency Department (HOSPITAL_COMMUNITY): Payer: Medicare Other

## 2017-03-25 ENCOUNTER — Emergency Department (HOSPITAL_COMMUNITY)
Admission: EM | Admit: 2017-03-25 | Discharge: 2017-03-25 | Disposition: A | Discharge status: Home / Self Care | Payer: Medicare Other | Attending: Emergency Medicine | Admitting: Emergency Medicine

## 2017-03-25 DIAGNOSIS — I10 Essential (primary) hypertension: Secondary | ICD-10-CM | POA: Diagnosis not present

## 2017-03-25 DIAGNOSIS — Z96612 Presence of left artificial shoulder joint: Secondary | ICD-10-CM | POA: Insufficient documentation

## 2017-03-25 DIAGNOSIS — Z7984 Long term (current) use of oral hypoglycemic drugs: Secondary | ICD-10-CM | POA: Diagnosis not present

## 2017-03-25 DIAGNOSIS — F17210 Nicotine dependence, cigarettes, uncomplicated: Secondary | ICD-10-CM | POA: Insufficient documentation

## 2017-03-25 DIAGNOSIS — K219 Gastro-esophageal reflux disease without esophagitis: Secondary | ICD-10-CM

## 2017-03-25 DIAGNOSIS — R079 Chest pain, unspecified: Secondary | ICD-10-CM | POA: Diagnosis not present

## 2017-03-25 DIAGNOSIS — R072 Precordial pain: Secondary | ICD-10-CM | POA: Diagnosis present

## 2017-03-25 DIAGNOSIS — E119 Type 2 diabetes mellitus without complications: Secondary | ICD-10-CM | POA: Insufficient documentation

## 2017-03-25 DIAGNOSIS — Z79899 Other long term (current) drug therapy: Secondary | ICD-10-CM | POA: Diagnosis not present

## 2017-03-25 DIAGNOSIS — Z7982 Long term (current) use of aspirin: Secondary | ICD-10-CM | POA: Insufficient documentation

## 2017-03-25 LAB — HEPATIC FUNCTION PANEL
ALBUMIN: 4.1 g/dL (ref 3.5–5.0)
ALT: 20 U/L (ref 17–63)
AST: 24 U/L (ref 15–41)
Alkaline Phosphatase: 51 U/L (ref 38–126)
BILIRUBIN TOTAL: 0.7 mg/dL (ref 0.3–1.2)
Bilirubin, Direct: 0.1 mg/dL (ref 0.1–0.5)
Indirect Bilirubin: 0.6 mg/dL (ref 0.3–0.9)
Total Protein: 7.3 g/dL (ref 6.5–8.1)

## 2017-03-25 LAB — BASIC METABOLIC PANEL
ANION GAP: 13 (ref 5–15)
BUN: 13 mg/dL (ref 6–20)
CALCIUM: 9.6 mg/dL (ref 8.9–10.3)
CHLORIDE: 99 mmol/L — AB (ref 101–111)
CO2: 24 mmol/L (ref 22–32)
Creatinine, Ser: 1.2 mg/dL (ref 0.61–1.24)
GFR calc Af Amer: >60 mL/min (ref >60)
GFR calc non Af Amer: 59 mL/min — ABNORMAL LOW (ref >60)
GLUCOSE: 158 mg/dL — AB (ref 65–99)
Potassium: 3.8 mmol/L (ref 3.5–5.1)
Sodium: 136 mmol/L (ref 135–145)

## 2017-03-25 LAB — LIPASE, BLOOD: LIPASE: 99 U/L — AB (ref 11–51)

## 2017-03-25 LAB — CBC
HCT: 43.4 % (ref 39.0–52.0)
HEMOGLOBIN: 15.2 g/dL (ref 13.0–17.0)
MCH: 27.9 pg (ref 26.0–34.0)
MCHC: 35 g/dL (ref 30.0–36.0)
MCV: 79.8 fL (ref 78.0–100.0)
Platelets: 262 10*3/uL (ref 150–400)
RBC: 5.44 MIL/uL (ref 4.22–5.81)
RDW: 13.5 % (ref 11.5–15.5)
WBC: 5.6 10*3/uL (ref 4.0–10.5)

## 2017-03-25 LAB — CBG MONITORING, ED: Glucose-Capillary: 156 mg/dL — ABNORMAL HIGH (ref 65–99)

## 2017-03-25 LAB — I-STAT TROPONIN, ED
TROPONIN I, POC: 0 ng/mL (ref 0.00–0.08)
Troponin i, poc: 0 ng/mL (ref 0.00–0.08)

## 2017-03-25 MED ORDER — OMEPRAZOLE 40 MG PO CPDR: 40.0000 mg | DELAYED_RELEASE_CAPSULE | Freq: Every day | ORAL | 0 refills | Status: DC

## 2017-03-25 NOTE — ED Triage Notes (Signed)
Pt to ER for dizziness and chest pain with radiation into left arm onset one week ago progressively getting worse. Pt denies hx of same. Pt reports the room spins when he stands up. Denies shortness of breath. A/o x4.
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
## 2017-03-25 NOTE — ED Notes (Signed)
Pt departed in NAD, refused use of wheelchair.  

## 2017-03-27 ENCOUNTER — Other Ambulatory Visit: Payer: Self-pay | Admitting: Family Medicine

## 2017-03-28 ENCOUNTER — Other Ambulatory Visit: Payer: Self-pay | Admitting: *Deleted

## 2017-03-28 NOTE — Telephone Encounter (Signed)
Pharmacy requesting 90 day supply on medications.

## 2017-04-02 DIAGNOSIS — M542 Cervicalgia: Secondary | ICD-10-CM | POA: Diagnosis not present

## 2017-04-08 ENCOUNTER — Ambulatory Visit (INDEPENDENT_AMBULATORY_CARE_PROVIDER_SITE_OTHER): Payer: Medicare Other | Admitting: Family Medicine

## 2017-04-08 ENCOUNTER — Encounter: Payer: Self-pay | Admitting: Family Medicine

## 2017-04-08 VITALS — BP 148/84 | HR 99 | Temp 97.7°F | Ht 74.0 in | Wt 235.0 lb

## 2017-04-08 DIAGNOSIS — S46812A Strain of other muscles, fascia and tendons at shoulder and upper arm level, left arm, initial encounter: Secondary | ICD-10-CM

## 2017-04-08 DIAGNOSIS — R4589 Other symptoms and signs involving emotional state: Secondary | ICD-10-CM

## 2017-04-08 DIAGNOSIS — R5383 Other fatigue: Secondary | ICD-10-CM | POA: Diagnosis not present

## 2017-04-08 DIAGNOSIS — F329 Major depressive disorder, single episode, unspecified: Secondary | ICD-10-CM | POA: Diagnosis not present

## 2017-04-08 NOTE — Patient Instructions (Addendum)
You were seen today to discuss some neck/shoulder discomfort and some changes in mood/feeling of tiredness.   I have checked your thyroid levels and I will let you know what the results are.   For your shoulder discomfort, I think this is a muscle strain.  Try tylenol, muscle relaxer and these exercises:  Lie on your stomach with a pillow or towel under your forehead for comfort, if you want. With your arms at your sides, pull your shoulder blades together and down as far as you can and hold for 10 seconds. Try performing 1 set of 10 repetitions, 3 times a week.   Please schedule an eye exam ASAP.   Very nice seeing you today, Brian Dominguez L. [name], MD [facility] Resident PGY-1 04/08/2017 2:47 PM

## 2017-04-09 DIAGNOSIS — S46812A Strain of other muscles, fascia and tendons at shoulder and upper arm level, left arm, initial encounter: Secondary | ICD-10-CM | POA: Insufficient documentation

## 2017-04-09 DIAGNOSIS — F329 Major depressive disorder, single episode, unspecified: Secondary | ICD-10-CM

## 2017-04-09 DIAGNOSIS — R4589 Other symptoms and signs involving emotional state: Secondary | ICD-10-CM | POA: Insufficient documentation

## 2017-04-09 LAB — T4, FREE: Free T4: 1.22 ng/dL (ref 0.82–1.77)

## 2017-04-09 LAB — TSH: TSH: 2.38 u[IU]/mL (ref 0.450–4.500)

## 2017-04-09 NOTE — Assessment & Plan Note (Addendum)
New onset discomfort on right shoulder and neck worse with moving head to right side and bringing right ear to shoulder. Full passive and active range of motion. No changes in sensation and denies numbness or tingling in extremities. TTP on right trapezius and discomfort improved with shoulder shrugs. -recommended tylenol, ice and provided patient with instructions for home exercises -f/u in 6 weeks

## 2017-04-09 NOTE — Progress Notes (Signed)
Subjective:  Brian Dominguez is a 71 y.o. male who presents to the [facility] today with a chief complaint of depressed mood and R shoulder pain  HPI:  Depressed mood: Patient self reports "depressed mood".  PHQ-9 was 10 today. No previous scores to compare to. Patient reported little interest or pleasure in doing things nearly every day as well as feeling down and depressed or hopeless more than half of today's and trouble falling asleep or staying asleep or sleeping too much more than half of days. Additionally reports feeling tired or having little energy. He did not score for the remainder of questions including, feeling that he was better off dead or any intentions to hurt himself or others. States that much of his current mood has to do with his chronic pain.  He has a history of osteoarthritis in multiple joints as well as multiple surgeries including laminectomy, anterior cervical decompression/discectomy fusion, left shoulder laparoscopically and shoulder arthroplasty and anterior lumbar fusion. After further discussion he states that he does not feel depressed, but more tired than anything. Had been feeling this way for about one month.  Denies any fatigue, constipation or cold intolerance.   L shoulder/neck pain: Denies any trauma to neck or shoulder and has no limitations in his ROM. No numbness or tingling in his extremities. Does not have any appreciable changes in strength.   PMH: Chronic pain Tobacco use: Smokes half pack per day Medication: reviewed and updated ROS: see HPI   Objective:  Physical Exam: BP (!) 148/84 (BP Location: Left Arm, Patient Position: Sitting, Cuff Size: Large)   Pulse 99   Temp 97.7 F (36.5 C) (Oral)   Ht  (1.88 m)   Wt 106.6 kg (235 lb)   SpO2 99%   BMI 30.17 kg/m   Gen: 71 year old male in NAD, resting comfortably CV: RRR with no murmurs appreciated Pulm: NWOB, CTAB with no crackles, wheezes, or rhonchi GI: Normal bowel sounds present. Soft,  Nontender, Nondistended. MSK: no edema, cyanosis, or clubbing noted, tenderness to palpation on left trapezius with pain relief with bilateral shoulder elevation. Does have discomfort bringing right ear to right shoulder and turning head to right.  Skin: warm, dry Neuro: grossly normal, moves all extremities Psych: Normal affect and thought content  Results for orders placed or performed in visit on 04/08/17 (from the past 72 hour(s))  TSH     Status: None   Collection Time: 04/08/17  2:13 PM  Result Value Ref Range   TSH 2.380 0.450 - 4.500 uIU/mL  T4, Free     Status: None   Collection Time: 04/08/17  2:13 PM  Result Value Ref Range   Free T4 1.22 0.82 - 1.77 ng/dL     Assessment/Plan:  Depressed mood Per chart review has history of major depression, last updated 10 years ago. Patient denies ever being on medication or speaking to physician about depression.  PHQ-9 score of 10. This is my first time meeting this patient and he had difficulty confirming his medications with me and I feel uncomfortable starting him on anything at this point, nor do I necessarily feel it is appropriate. Has had high TSH in the past  Checked TSH/FT4 and both were within normal limits.  -recommended f/u for trapezius strain in 6 weeks; can reassess mood at that time  Trapezius strain, left, initial encounter New onset discomfort on right shoulder and neck worse with moving head to right side and bringing right ear to  shoulder. Full passive and active range of motion. No changes in sensation and denies numbness or tingling in extremities. TTP on right trapezius and discomfort improved with shoulder shrugs. -recommended tylenol, ice and provided patient with instructions for home exercises -f/u in 6 weeks   Health maintenance: Attempted to reconcile medications with patient and he had trouble recalling all of his medications even when prompted. I was especially confused with his blood pressure medications  (amlodipine/HCTZ). He is not familiar with amlodipine, but Dr. Dennison Nancy most recent notes states that he is taking both of these. Will need to examine this further and call patient to discuss.

## 2017-04-09 NOTE — Assessment & Plan Note (Signed)
Per chart review has history of major depression, last updated 10 years ago. Patient denies ever being on medication or speaking to physician about depression.  PHQ-9 score of 10. This is my first time meeting this patient and he had difficulty confirming his medications with me and I feel uncomfortable starting him on anything at this point, nor do I necessarily feel it is appropriate. Has had high TSH in the past  Checked TSH/FT4 and both were within normal limits.  -recommended f/u for trapezius strain in 6 weeks; can reassess mood at that time

## 2017-04-10 DIAGNOSIS — R140 Abdominal distension (gaseous): Secondary | ICD-10-CM | POA: Diagnosis not present

## 2017-04-10 DIAGNOSIS — R1011 Right upper quadrant pain: Secondary | ICD-10-CM | POA: Diagnosis not present

## 2017-04-12 ENCOUNTER — Other Ambulatory Visit: Payer: Self-pay | Admitting: *Deleted

## 2017-04-12 MED ORDER — ESOMEPRAZOLE MAGNESIUM 20 MG PO CPDR: 40.0000 mg | DELAYED_RELEASE_CAPSULE | Freq: Every day | ORAL | 1 refills | Status: DC

## 2017-04-19 ENCOUNTER — Other Ambulatory Visit: Payer: Self-pay | Admitting: Family Medicine

## 2017-04-19 ENCOUNTER — Telehealth: Payer: Self-pay | Admitting: Family Medicine

## 2017-04-19 NOTE — Telephone Encounter (Signed)
Called patient to discuss labs and called in prescription to [facility].   Thank you, Dr. Myrtie SomanWarden

## 2017-04-19 NOTE — Telephone Encounter (Signed)
Would like results from 04/08/17 labs. Also thought dr was suppose to call in muscle relaxer but never did.  [facility] uses [facility] [address]

## 2017-05-02 DIAGNOSIS — M542 Cervicalgia: Secondary | ICD-10-CM | POA: Diagnosis not present

## 2017-05-08 ENCOUNTER — Other Ambulatory Visit: Payer: Self-pay | Admitting: *Deleted

## 2017-05-08 MED ORDER — TAMSULOSIN HCL 0.4 MG PO CAPS: 0.4000 mg | ORAL_CAPSULE | Freq: Every day | ORAL | 0 refills | Status: DC

## 2017-05-10 DIAGNOSIS — M47816 Spondylosis without myelopathy or radiculopathy, lumbar region: Secondary | ICD-10-CM | POA: Diagnosis not present

## 2017-05-10 DIAGNOSIS — M791 Myalgia: Secondary | ICD-10-CM | POA: Diagnosis not present

## 2017-05-23 DIAGNOSIS — M47816 Spondylosis without myelopathy or radiculopathy, lumbar region: Secondary | ICD-10-CM | POA: Diagnosis not present

## 2017-05-31 DIAGNOSIS — N401 Enlarged prostate with lower urinary tract symptoms: Secondary | ICD-10-CM | POA: Diagnosis not present

## 2017-05-31 DIAGNOSIS — R300 Dysuria: Secondary | ICD-10-CM | POA: Diagnosis not present

## 2017-06-02 DIAGNOSIS — M542 Cervicalgia: Secondary | ICD-10-CM | POA: Diagnosis not present

## 2017-06-10 DIAGNOSIS — M542 Cervicalgia: Secondary | ICD-10-CM | POA: Diagnosis not present

## 2017-06-10 DIAGNOSIS — M546 Pain in thoracic spine: Secondary | ICD-10-CM | POA: Diagnosis not present

## 2017-06-10 DIAGNOSIS — M47817 Spondylosis without myelopathy or radiculopathy, lumbosacral region: Secondary | ICD-10-CM | POA: Diagnosis not present

## 2017-06-10 DIAGNOSIS — M5415 Radiculopathy, thoracolumbar region: Secondary | ICD-10-CM | POA: Diagnosis not present

## 2017-06-10 DIAGNOSIS — M5416 Radiculopathy, lumbar region: Secondary | ICD-10-CM | POA: Diagnosis not present

## 2017-06-21 ENCOUNTER — Other Ambulatory Visit: Payer: Self-pay | Admitting: Family Medicine

## 2017-06-27 ENCOUNTER — Encounter (HOSPITAL_COMMUNITY): Payer: Self-pay | Admitting: Emergency Medicine

## 2017-06-27 ENCOUNTER — Emergency Department (HOSPITAL_COMMUNITY)
Admission: EM | Admit: 2017-06-27 | Discharge: 2017-06-27 | Disposition: A | Discharge status: Home / Self Care | Payer: Medicare Other | Attending: Emergency Medicine | Admitting: Emergency Medicine

## 2017-06-27 DIAGNOSIS — L299 Pruritus, unspecified: Secondary | ICD-10-CM | POA: Diagnosis not present

## 2017-06-27 DIAGNOSIS — Z79899 Other long term (current) drug therapy: Secondary | ICD-10-CM | POA: Diagnosis not present

## 2017-06-27 DIAGNOSIS — Z7982 Long term (current) use of aspirin: Secondary | ICD-10-CM | POA: Insufficient documentation

## 2017-06-27 DIAGNOSIS — Z7984 Long term (current) use of oral hypoglycemic drugs: Secondary | ICD-10-CM | POA: Insufficient documentation

## 2017-06-27 DIAGNOSIS — R053 Chronic cough: Secondary | ICD-10-CM

## 2017-06-27 DIAGNOSIS — J302 Other seasonal allergic rhinitis: Secondary | ICD-10-CM | POA: Diagnosis not present

## 2017-06-27 DIAGNOSIS — R1011 Right upper quadrant pain: Secondary | ICD-10-CM | POA: Diagnosis not present

## 2017-06-27 DIAGNOSIS — R05 Cough: Secondary | ICD-10-CM | POA: Diagnosis not present

## 2017-06-27 DIAGNOSIS — R110 Nausea: Secondary | ICD-10-CM | POA: Diagnosis present

## 2017-06-27 DIAGNOSIS — E119 Type 2 diabetes mellitus without complications: Secondary | ICD-10-CM | POA: Diagnosis not present

## 2017-06-27 LAB — CBC
HEMATOCRIT: 42 % (ref 39.0–52.0)
HEMOGLOBIN: 14.7 g/dL (ref 13.0–17.0)
MCH: 28.3 pg (ref 26.0–34.0)
MCHC: 35 g/dL (ref 30.0–36.0)
MCV: 80.9 fL (ref 78.0–100.0)
Platelets: 241 10*3/uL (ref 150–400)
RBC: 5.19 MIL/uL (ref 4.22–5.81)
RDW: 14.2 % (ref 11.5–15.5)
WBC: 5.5 10*3/uL (ref 4.0–10.5)

## 2017-06-27 LAB — COMPREHENSIVE METABOLIC PANEL
ALBUMIN: 4.2 g/dL (ref 3.5–5.0)
ALT: 16 U/L — ABNORMAL LOW (ref 17–63)
AST: 18 U/L (ref 15–41)
Alkaline Phosphatase: 44 U/L (ref 38–126)
Anion gap: 11 (ref 5–15)
BILIRUBIN TOTAL: 0.5 mg/dL (ref 0.3–1.2)
BUN: 12 mg/dL (ref 6–20)
CO2: 23 mmol/L (ref 22–32)
Calcium: 9.6 mg/dL (ref 8.9–10.3)
Chloride: 104 mmol/L (ref 101–111)
Creatinine, Ser: 1 mg/dL (ref 0.61–1.24)
GFR calc Af Amer: >60 mL/min (ref >60)
GFR calc non Af Amer: >60 mL/min (ref >60)
GLUCOSE: 160 mg/dL — AB (ref 65–99)
POTASSIUM: 4.2 mmol/L (ref 3.5–5.1)
SODIUM: 138 mmol/L (ref 135–145)
Total Protein: 7.1 g/dL (ref 6.5–8.1)

## 2017-06-27 LAB — I-STAT TROPONIN, ED: Troponin i, poc: 0 ng/mL (ref 0.00–0.08)

## 2017-06-27 MED ORDER — DIPHENHYDRAMINE HCL 25 MG PO CAPS
50.0000 mg | ORAL_CAPSULE | Freq: Once | ORAL | Status: AC
  Administered 2017-06-27: 50 mg via ORAL
  Filled 2017-06-27: qty 2

## 2017-06-27 MED ORDER — BENZONATATE 100 MG PO CAPS
200.0000 mg | ORAL_CAPSULE | Freq: Once | ORAL | Status: AC
  Administered 2017-06-27: 200 mg via ORAL
  Filled 2017-06-27: qty 2

## 2017-06-27 MED ORDER — PROMETHAZINE HCL 25 MG PO TABS: 25.0000 mg | ORAL_TABLET | Freq: Four times a day (QID) | ORAL | 0 refills | Status: DC | PRN

## 2017-06-27 MED ORDER — BENZONATATE 100 MG PO CAPS: 100.0000 mg | ORAL_CAPSULE | Freq: Three times a day (TID) | ORAL | 0 refills | Status: DC

## 2017-06-27 MED ORDER — FEXOFENADINE HCL 60 MG PO TABS: 60.0000 mg | ORAL_TABLET | Freq: Two times a day (BID) | ORAL | 0 refills | Status: DC
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
## 2017-06-27 NOTE — Discharge Instructions (Signed)
Please take the medicine prescribed for your symptoms of nausea, itching and allergies. The labs in the ER are normal and we dont think there is any surgical problems in the abdomen at this time. See your primary doctor in 1 week.   Please return to the ER if your symptoms worsen; you have increased pain, fevers, chills, inability to keep any medications down, confusion. Otherwise see the outpatient doctor as requested.

## 2017-06-27 NOTE — ED Triage Notes (Signed)
Pt sts some abd pain with nausea x 3 days; pt sts itching eyes and skin today

## 2017-06-29 ENCOUNTER — Other Ambulatory Visit: Payer: Self-pay | Admitting: Family Medicine

## 2017-07-02 DIAGNOSIS — M542 Cervicalgia: Secondary | ICD-10-CM | POA: Diagnosis not present

## 2017-07-04 DIAGNOSIS — M542 Cervicalgia: Secondary | ICD-10-CM | POA: Diagnosis not present

## 2017-07-04 DIAGNOSIS — M47817 Spondylosis without myelopathy or radiculopathy, lumbosacral region: Secondary | ICD-10-CM | POA: Diagnosis not present

## 2017-07-04 DIAGNOSIS — M546 Pain in thoracic spine: Secondary | ICD-10-CM | POA: Diagnosis not present

## 2017-07-04 DIAGNOSIS — M5416 Radiculopathy, lumbar region: Secondary | ICD-10-CM | POA: Diagnosis not present

## 2017-07-04 DIAGNOSIS — M5415 Radiculopathy, thoracolumbar region: Secondary | ICD-10-CM | POA: Diagnosis not present

## 2017-07-29 ENCOUNTER — Other Ambulatory Visit: Payer: Self-pay | Admitting: *Deleted

## 2017-07-29 MED ORDER — BACLOFEN 10 MG PO TABS: ORAL_TABLET | ORAL | 0 refills | Status: DC

## 2017-08-01 ENCOUNTER — Other Ambulatory Visit: Payer: Self-pay | Admitting: Family Medicine

## 2017-08-02 DIAGNOSIS — M542 Cervicalgia: Secondary | ICD-10-CM | POA: Diagnosis not present

## 2017-08-05 ENCOUNTER — Ambulatory Visit (INDEPENDENT_AMBULATORY_CARE_PROVIDER_SITE_OTHER): Payer: Medicare Other | Admitting: Family Medicine

## 2017-08-05 VITALS — BP 150/92 | HR 101 | Temp 98.4°F | Ht 74.0 in | Wt 229.8 lb

## 2017-08-05 DIAGNOSIS — M25512 Pain in left shoulder: Secondary | ICD-10-CM | POA: Diagnosis not present

## 2017-08-05 DIAGNOSIS — E119 Type 2 diabetes mellitus without complications: Secondary | ICD-10-CM

## 2017-08-05 DIAGNOSIS — I10 Essential (primary) hypertension: Secondary | ICD-10-CM

## 2017-08-05 DIAGNOSIS — Z23 Encounter for immunization: Secondary | ICD-10-CM

## 2017-08-05 MED ORDER — AMLODIPINE BESYLATE 10 MG PO TABS: 10.0000 mg | ORAL_TABLET | Freq: Every day | ORAL | 3 refills | Status: DC

## 2017-08-05 NOTE — Patient Instructions (Signed)
Brian Dominguez, you were seen today for concern due to increased urination with your BP medication and I have stopped your hydrochlorothiazide and started you on amlodipine 10mg  once a day.   I have placed a referral for physical therapy for your left shoulder.  For your depression I will be seeing you back on Friday and will have our behavioral health specialist come and chat as well and set up an appointment with you.  At that point we can talk about possibly starting medication.  Very nice seeing you today, [name] L. [name], MD [facility] Family Medicine Resident PGY-2 08/05/2017 12:33 PM

## 2017-08-05 NOTE — Progress Notes (Signed)
Subjective:  Brian Dominguez is a 71 y.o. male who presents to the [facility] today with a chief complaint of increased urination, shoulder pain and worsening mood  HPI:  Hypertension BP Readings from Last 3 Encounters:  08/05/17 (!) 150/92  06/27/17 127/68  04/08/17 (!) 148/84   Home BP monitoring-No Compliant with medications-noncompliant, due to increased urination thought to be secondary to hydrochlorothiazide ROS-Denies any CP, HA, SOB, blurry vision, LE edema, transient weakness, orthopnea, PND.  Left shoulder pain: Patient has significant history of degenerative disc disease and cervical spine. Reports history of left shoulder replacement. Has had multiple steroid injections. Denies any weakness, numbness, tingling, loss of bladder or bowel function. No recent trauma. Has been taking NSAIDs and baclofen when necessary.  Requesting referral to physical therapy.   Depressed mood: Reports worsening depressed mood for several years now. Last PHQ9 in April 2018 was 10. Was not interested in medication at that time and denied interest in counseling. Reports having little interest in doing things nearly every day as well as feeling down, depressed and hopeless. Has trouble concentrating and also reports feeling tired or having little energy as well as poor appetite and more than half the days in the week. Denies any thoughts of self-harm or harming others.   Tobacco use:  Interested in quitting smoking.   Tobacco use reviewed Medication: reviewed and updated ROS: see HPI   Objective:  Physical Exam: BP (!) 150/92   Pulse (!) 101   Temp 98.4 F (36.9 C) (Oral)   Ht 6\' 2"  (1.88 m)   Wt 229 lb 12.8 oz (104.2 kg)   SpO2 99%   BMI 29.50 kg/m   Gen: 71 year old man in NAD, resting comfortably CV: RRR with no murmurs appreciated Pulm: NWOB, CTAB with no crackles, wheezes, or rhonchi GI: Normal bowel sounds present. Soft, Nontender, Nondistended. MSK: No gross abnormalities or edema,  shoulder tender to palpation at [facility] joint and biceps tendon, with PROM/AROM and tenderness at L trapezius  Skin: warm, dry Neuro: grossly normal, moves all extremities Psych: Normal affect and thought content  Depression screen [facility] 2/9 08/05/2017 08/05/2017 04/08/2017 03/15/2017 05/18/2016  Decreased Interest 3 3 3  0 1  Down, Depressed, Hopeless 3 3 2  0 1  PHQ - 2 Score 6 6 5  0 2  Altered sleeping - 2 2 - 1  Tired, decreased energy - 2 3 - 1  Change in appetite - 2 0 - 0  Feeling bad or failure about yourself  - 0 0 - 0  Trouble concentrating - 3 0 - 0  Moving slowly or fidgety/restless - 0 0 - 0  Suicidal thoughts - 0 0 - -  PHQ-9 Score - 15 10 - 4  Difficult doing work/chores - Very difficult Not difficult at all - -  Some recent data might be hidden    No results found for this or any previous visit (from the past 72 hour(s)).   Assessment/Plan:  HYPERTENSION, BENIGN SYSTEMIC Previously well controlled with hydrochlorothiazide 25 mg daily. Patient has stopped taking medication for the past couple of weeks due to his concerns of his baseline trouble with urination secondary to his BPH and diuretic effect of medication. No side effects. No red flags. Blood pressure elevated today.  - Stop hydrochlorothiazide - Start amlodipine 10 mg daily - Follow-up on 08/09/17   Shoulder pain Chronic pain controlled with muscle relaxers and NSAIDs. Status post shoulder replacement. No red flags today. Requesting referral to physical  therapy. -Ambulatory physical therapy referral placed  Depressed mood PHQ 9 today to 15 up from 10 4 months ago. No thoughts of self-harm or harming others. States that he is interested in medication and counseling. Asked him to come back on 08/09/2017 and will have Behavioral Health meet him in the office and we'll discuss treatment options. - Follow-up 08/09/17  Tobacco use Long-term use of tobacco interested in stopping -will address on 08/09/2017  Alexea Blase L. [name],  MD [facility] Family Medicine Resident PGY-2 08/08/2017 10:37 AM

## 2017-08-06 DIAGNOSIS — M5416 Radiculopathy, lumbar region: Secondary | ICD-10-CM | POA: Diagnosis not present

## 2017-08-06 DIAGNOSIS — M5415 Radiculopathy, thoracolumbar region: Secondary | ICD-10-CM | POA: Diagnosis not present

## 2017-08-06 DIAGNOSIS — M542 Cervicalgia: Secondary | ICD-10-CM | POA: Diagnosis not present

## 2017-08-06 DIAGNOSIS — M47817 Spondylosis without myelopathy or radiculopathy, lumbosacral region: Secondary | ICD-10-CM | POA: Diagnosis not present

## 2017-08-06 DIAGNOSIS — M546 Pain in thoracic spine: Secondary | ICD-10-CM | POA: Diagnosis not present

## 2017-08-07 ENCOUNTER — Other Ambulatory Visit: Payer: Self-pay | Admitting: *Deleted

## 2017-08-07 MED ORDER — GLUCOSE BLOOD VI STRP: ORAL_STRIP | 4 refills | Status: DC

## 2017-08-08 ENCOUNTER — Encounter: Payer: Self-pay | Admitting: Family Medicine

## 2017-08-08 NOTE — Assessment & Plan Note (Signed)
Previously well controlled with hydrochlorothiazide 25 mg daily. Patient has stopped taking medication for the past couple of weeks due to his concerns of his baseline trouble with urination secondary to his BPH and diuretic effect of medication. No side effects. No red flags. Blood pressure elevated today.  - Stop hydrochlorothiazide - Start amlodipine 10 mg daily - Follow-up on 08/09/17

## 2017-08-09 ENCOUNTER — Ambulatory Visit (INDEPENDENT_AMBULATORY_CARE_PROVIDER_SITE_OTHER): Payer: Medicare Other | Admitting: Family Medicine

## 2017-08-09 ENCOUNTER — Encounter: Payer: Self-pay | Admitting: Family Medicine

## 2017-08-09 ENCOUNTER — Encounter: Payer: Self-pay | Admitting: Psychology

## 2017-08-09 VITALS — BP 120/68 | HR 91 | Temp 97.8°F | Ht 73.0 in | Wt 239.6 lb

## 2017-08-09 DIAGNOSIS — F329 Major depressive disorder, single episode, unspecified: Secondary | ICD-10-CM | POA: Diagnosis not present

## 2017-08-09 DIAGNOSIS — F339 Major depressive disorder, recurrent, unspecified: Secondary | ICD-10-CM

## 2017-08-09 DIAGNOSIS — I10 Essential (primary) hypertension: Secondary | ICD-10-CM | POA: Diagnosis not present

## 2017-08-09 DIAGNOSIS — F32A Depression, unspecified: Secondary | ICD-10-CM

## 2017-08-09 DIAGNOSIS — F17200 Nicotine dependence, unspecified, uncomplicated: Secondary | ICD-10-CM

## 2017-08-09 DIAGNOSIS — R4589 Other symptoms and signs involving emotional state: Secondary | ICD-10-CM

## 2017-08-09 MED ORDER — CETIRIZINE HCL 10 MG PO TABS: 10.0000 mg | ORAL_TABLET | Freq: Every day | ORAL | 1 refills | Status: DC

## 2017-08-09 NOTE — Assessment & Plan Note (Signed)
Blood pressure within normal limits after change from hydrochlorothiazide 25 mg to amlodipine 10 mg daily. No side effects and no symptoms during today's visit.  - Continue current regimen

## 2017-08-09 NOTE — Progress Notes (Signed)
[facility]arden [facility]   Presenting Issue: Patient reported worsening of depression and some anxiety symptoms.   Report of symptoms: Patient reported anhedonia. NED, feelings bad about self, troubles concentrating, restlessness NED, and feelin down  More than half the days. He also reported some uncontrollable worry, trouble relaxing and fear of going out although he pushes himself to go out.   Duration of CURRENT symptoms: Symptoms have been exacerbated in last 4 to 5 years after back and knee pain began  Age of onset of first mood disturbance: In his 6020s   Impact on function: Symptoms have contributed to social withdrawal.   Psychiatric History - Diagnoses: Depression and anxiety - Hospitalizations:  None - Pharmacotherapy: None  - Outpatient therapy: None  Family history of psychiatric issues: Mother had depression and was hospitalized for mental illness when patient was 71 yo but he had no further information on dx.   Current and history of substance use: Smokes 1/2 pack cigarettes/ day. Denied any alcohol or there substance use  Other:Chronic back and knee pain PHQ-9:  19 GAD7: 7  Warm handoff complete.

## 2017-08-09 NOTE — Patient Instructions (Signed)
Brian Dominguez, we spoke about your tobacco use and you want to wait until next week when you meet with Brian Dominguez to start with the patch.  I will have her find me and we can chat about it in person.   You also met with Brian Dominguez, out Psychologist and I am glad to hear that you will be meeting with her next week.  Your blood pressure medication is working well without side effects. We will not make any changes.   Very nice to see you today, Brian Dominguez L. Rosalyn Gess, [place] [facility] Resident PGY-2 08/09/2017 9:31 AM

## 2017-08-09 NOTE — Assessment & Plan Note (Signed)
PHQ 9 to 19 today and GAD 7 to 7. Patient met with psychologist, Verdis Frederickson today in the office for evaluation. They will also be following up next Friday, 08/16/2017. He states that he is looking forward to working with her in the future and feels optimistic. Denies any thoughts of self-harm or thoughts of harming others.  - follow up with Verdis Frederickson on 08/16/17 - follow up with me on 08/16/17

## 2017-08-09 NOTE — Progress Notes (Signed)
    Subjective:  Brian Dominguez is a 71 y.o. male who presents to the [facility] today to discuss smoking cessation and to meet behavioral health psychologist  HPI:  Depression: Reports long history of depression first came on in his 9s but been getting worse over the past 4-5 years. Primary symptoms or anhedonia, feeling bad about himself, trouble concentrating, restlessness and feeling down more than half of today's. Denies any thoughts of self-harm or harming others. Feels the symptoms might be related to his chronic pain.   Psychiatric History - Diagnoses: Depression and anxiety - Hospitalizations:  None - Pharmacotherapy: None  - Outpatient therapy: None  Family history of psychiatric issues: mother had depression and was hospitalized when he was a child.   Current and history of substance use: Smokes 1/2 ppd, no drug or etoh use  Other:Chronic back and knee pain PHQ-9:  19 GAD7: 7  Hypertension: Seen on 08/05/2017 for follow-up of hypertension and concern for increased urination secondary to hydrochlorothiazide and requested for medication change. Switch patient to amlodipine 10 mg daily. Denies any side effects. Blood pressure within normal limits today.  BP Readings from Last 3 Encounters:  08/09/17 120/68  08/05/17 (!) 150/92  06/27/17 127/68   Home BP monitoring-None Compliant with medications-yes without side effects ROS-Denies any CP, HA, SOB, blurry vision, LE edema, transient weakness, orthopnea, PND.   Tobacco abuse: Patient wishes to hear about options for tobacco cessation. States that he wants to try the patch.   Tobacco use reviewed Medication: reviewed and updated ROS: see HPI   Objective:  Physical Exam: BP 120/68   Pulse 91   Temp 97.8 F (36.6 C) (Oral)   Ht _0  (1.854 m)   Wt 239 lb 9.6 oz (108.7 kg)   SpO2 99%   BMI 31.61 kg/m   Gen: 71 year old male and NAD, resting comfortably CV: RRR with no murmurs appreciated Pulm: NWOB, CTAB with no  crackles, wheezes, or rhonchi GI: Normal bowel sounds present. Soft, Nontender, Nondistended. MSK: no edema, cyanosis, or clubbing noted Skin: warm, dry Neuro: grossly normal, moves all extremities Psych: Normal affect and thought content  No results found for this or any previous visit (from the past 72 hour(s)).   Assessment/Plan:  HYPERTENSION, BENIGN SYSTEMIC Blood pressure within normal limits after change from hydrochlorothiazide 25 mg to amlodipine 10 mg daily. No side effects and no symptoms during today's visit.  - Continue current regimen  Major depressive disorder, recurrent episode ([place]) PHQ 9 to 19 today and GAD 7 to 7. Patient met with psychologist, Verdis Frederickson today in the office for evaluation. They will also be following up next Friday, 08/16/2017. He states that he is looking forward to working with her in the future and feels optimistic. Denies any thoughts of self-harm or thoughts of harming others.  - follow up with Verdis Frederickson on 08/16/17 - follow up with me on 08/16/17  Tobacco use Upon further discussion, patient states that he would like to wait until next Friday to start the patch.   - scheduled appointment with for him at 8:30AM just before his appt with Verdis Frederickson.  - will likely require 80m patch

## 2017-08-13 ENCOUNTER — Telehealth: Payer: Self-pay | Admitting: Physical Therapy

## 2017-08-13 NOTE — Telephone Encounter (Signed)
08/06/17 left message to call to schedule PT eval, 08/13/17 left message to call to schedule PT eval

## 2017-08-14 NOTE — Assessment & Plan Note (Signed)
Patient presented with flat affect throughout the session. His PHQ-9 score (19) indicates moderately sever depression but he denied suicidal ideation. His GAD-7 score (7) indicatest mild anxiety sxs. He reported he is not socializing much and prefers not to talk to family about his depressive and anxiety sxs but was forthcoming with [facility]. He also expressed fear about leaving his house, which  contributes to some of his lack of socialization but was unable to identify what he fears. [facility] provided [facility] about depression and anxiety and introduced th concept of behavioral activation to reduce depressive sxs and deep breathing to manage anxiety. Additionally, [facility] discussed with patient how he can try and identify his fear cognitions   Patient will try deep breathing exercise [facility] demonstrated and gave handout) when he experiences fear. He will also think about his fear cognitions in the moment. He is [facility] next Friday at 9 AM.

## 2017-08-16 ENCOUNTER — Ambulatory Visit: Payer: Medicare Other

## 2017-08-16 ENCOUNTER — Ambulatory Visit: Payer: Medicare Other | Admitting: Family Medicine

## 2017-08-22 ENCOUNTER — Encounter: Payer: Self-pay | Admitting: Physical Therapy

## 2017-08-22 ENCOUNTER — Ambulatory Visit: Payer: Medicare Other | Attending: Family Medicine | Admitting: Physical Therapy

## 2017-08-22 DIAGNOSIS — R293 Abnormal posture: Secondary | ICD-10-CM | POA: Diagnosis not present

## 2017-08-22 DIAGNOSIS — M545 Low back pain: Secondary | ICD-10-CM | POA: Diagnosis not present

## 2017-08-22 DIAGNOSIS — G8929 Other chronic pain: Secondary | ICD-10-CM | POA: Diagnosis not present

## 2017-08-22 DIAGNOSIS — M62830 Muscle spasm of back: Secondary | ICD-10-CM | POA: Diagnosis not present

## 2017-08-22 DIAGNOSIS — M25612 Stiffness of left shoulder, not elsewhere classified: Secondary | ICD-10-CM

## 2017-08-22 DIAGNOSIS — R29898 Other symptoms and signs involving the musculoskeletal system: Secondary | ICD-10-CM | POA: Diagnosis not present

## 2017-08-22 DIAGNOSIS — R262 Difficulty in walking, not elsewhere classified: Secondary | ICD-10-CM | POA: Diagnosis not present

## 2017-08-22 DIAGNOSIS — M6281 Muscle weakness (generalized): Secondary | ICD-10-CM | POA: Diagnosis not present
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
## 2017-08-23 ENCOUNTER — Ambulatory Visit: Payer: Medicare Other

## 2017-08-26 ENCOUNTER — Ambulatory Visit: Payer: Medicare Other | Admitting: Physical Therapy

## 2017-08-26 ENCOUNTER — Encounter: Payer: Self-pay | Admitting: Physical Therapy

## 2017-08-26 DIAGNOSIS — R262 Difficulty in walking, not elsewhere classified: Secondary | ICD-10-CM | POA: Diagnosis not present

## 2017-08-26 DIAGNOSIS — R29898 Other symptoms and signs involving the musculoskeletal system: Secondary | ICD-10-CM | POA: Diagnosis not present

## 2017-08-26 DIAGNOSIS — M6281 Muscle weakness (generalized): Secondary | ICD-10-CM

## 2017-08-26 DIAGNOSIS — G8929 Other chronic pain: Secondary | ICD-10-CM | POA: Diagnosis not present

## 2017-08-26 DIAGNOSIS — M25612 Stiffness of left shoulder, not elsewhere classified: Secondary | ICD-10-CM | POA: Diagnosis not present

## 2017-08-26 DIAGNOSIS — R293 Abnormal posture: Secondary | ICD-10-CM | POA: Diagnosis not present

## 2017-08-26 DIAGNOSIS — M545 Low back pain: Secondary | ICD-10-CM | POA: Diagnosis not present

## 2017-08-26 DIAGNOSIS — M62830 Muscle spasm of back: Secondary | ICD-10-CM | POA: Diagnosis not present
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
## 2017-08-27 ENCOUNTER — Ambulatory Visit: Payer: Medicare Other

## 2017-08-28 ENCOUNTER — Ambulatory Visit: Payer: Medicare Other | Admitting: Physical Therapy

## 2017-08-28 DIAGNOSIS — M6281 Muscle weakness (generalized): Secondary | ICD-10-CM

## 2017-08-28 DIAGNOSIS — R262 Difficulty in walking, not elsewhere classified: Secondary | ICD-10-CM

## 2017-08-28 DIAGNOSIS — R293 Abnormal posture: Secondary | ICD-10-CM

## 2017-08-28 DIAGNOSIS — M25612 Stiffness of left shoulder, not elsewhere classified: Secondary | ICD-10-CM | POA: Diagnosis not present

## 2017-08-28 DIAGNOSIS — M545 Low back pain: Secondary | ICD-10-CM

## 2017-08-28 DIAGNOSIS — R29898 Other symptoms and signs involving the musculoskeletal system: Secondary | ICD-10-CM | POA: Diagnosis not present

## 2017-08-28 DIAGNOSIS — G8929 Other chronic pain: Secondary | ICD-10-CM

## 2017-08-28 DIAGNOSIS — M62830 Muscle spasm of back: Secondary | ICD-10-CM | POA: Diagnosis not present
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
## 2017-08-28 NOTE — Patient Instructions (Signed)
  Resistive Band Rowing   With resistive band anchored in door, grasp both ends. Keeping elbows bent, pull back, squeezing shoulder blades together. Hold __5__ seconds. Repeat _20___ times. Do _2___ sessions per day.  http://gt2.exer.us/97   Copyright  VHI. All rights reserved.    Strengthening: Resisted Internal Rotation   Hold tubing in left hand, elbow at side and forearm out. Rotate forearm in across body. Repeat __10__ times per set. Do _2___ sets per session. Do __2__ sessions per day.  http://orth.exer.us/830   Copyright  VHI. All rights reserved.  Strengthening: Resisted External Rotation   Hold tubing in right hand, elbow at side and forearm across body. Rotate forearm out. Repeat _10___ times per set. Do __2__ sets per session. Do __2__ sessions per day.

## 2017-08-30 ENCOUNTER — Ambulatory Visit (INDEPENDENT_AMBULATORY_CARE_PROVIDER_SITE_OTHER): Payer: Medicare Other | Admitting: Psychology

## 2017-08-30 DIAGNOSIS — F339 Major depressive disorder, recurrent, unspecified: Secondary | ICD-10-CM

## 2017-08-30 NOTE — Progress Notes (Signed)
Reason for follow-up:  Brian Dominguez followed-up with [facility] for continued management of depressive symptoms.    Issues discussed:  Patient reported some depressive symptoms over the past week, such as feeling down and unmotivated to do things, trouble sleeping and feeling tired. He reported financial limitations contribute to some of his lack of interest in doing things. He also reported some past suicidal ideation and [facility] conducted a suicide risk assessment, see current assessment and plan note. Patient reported he still engages in some social activities with friends, such as fishing. He also discussed some some troubles with waking up in the middle of the night and watching TV to help self fall asleep.   Identified goals:  Patient will continue to engage in social activities and attempt to increase these over the next two weeks. Additionally, patient will look into going to get some free meals provided in [place] with some friends. Finally, patient will try to cut out watching TV when he wakes up during the night.

## 2017-08-30 NOTE — Assessment & Plan Note (Signed)
Patient presented with slightly blunted affect during the visit. He reported experiencing several depressive symptoms over the past week, and his PHQ 9 score, 13, indicates moderate depression. He denied item 9, SI on the form; however, reported experiencing passive suicidal ideation a couple of months ago. During that time he had thoughts such as "what is the point of living?" He denied intent or a plan for suicide during that time and reported that he just didn't want to do it when asked about barriers. He has several risk factors for suicide including being an older male and struggling with chronic pain; however he denied other risk factors such as drinking or having weapons in the home.  [facility] discussed with him how he can keep himself safe if these thoughts come up again, including contacting [facility], speaking with [facility] about such thoughts and going to the emergency department if he does not think he can keep himself safe. Additionally, she gave patent print out of suicide hotlines and tips for how to feel better in the moment, including reaching out and talking to friends and family.  [facility] consulted with Dr. Pascal Lux and it was determined that he has a low risk for committing suicide and hospital admission was not indicated.   Patient also inquired about medication for treating his depression and [facility] recommended he speak to his doctor.   Intervention included discussing the impact of social withdrawal and inactivity on depression and discussing ways he can increase his social engagement. Specifically, given his limited financial resources, [facility] provided patient with booklet of free meals served in [place] he can get with friends as a social activity. She also provided psychoeducation about sleep hygiene and patient will try to limit watching TV in the middle of the night when he wakes up and will instead attempt to read. He will return to see [facility] in 2 weeks on October 5th at 8:30 AM.

## 2017-08-30 NOTE — Patient Instructions (Signed)
Meet with [facility] in 2 weeks on October 5th Continue to engage socially. Consider going to some of the free meals in [place] as free options. Stop watching TV when you wake up in the middle of the night and read something instead.

## 2017-09-02 ENCOUNTER — Ambulatory Visit: Payer: Medicare Other | Admitting: Physical Therapy

## 2017-09-02 DIAGNOSIS — M25612 Stiffness of left shoulder, not elsewhere classified: Secondary | ICD-10-CM | POA: Diagnosis not present

## 2017-09-02 DIAGNOSIS — M6281 Muscle weakness (generalized): Secondary | ICD-10-CM | POA: Diagnosis not present

## 2017-09-02 DIAGNOSIS — R262 Difficulty in walking, not elsewhere classified: Secondary | ICD-10-CM

## 2017-09-02 DIAGNOSIS — M542 Cervicalgia: Secondary | ICD-10-CM | POA: Diagnosis not present

## 2017-09-02 DIAGNOSIS — M545 Low back pain, unspecified: Secondary | ICD-10-CM

## 2017-09-02 DIAGNOSIS — R29898 Other symptoms and signs involving the musculoskeletal system: Secondary | ICD-10-CM | POA: Diagnosis not present

## 2017-09-02 DIAGNOSIS — G8929 Other chronic pain: Secondary | ICD-10-CM

## 2017-09-02 DIAGNOSIS — R293 Abnormal posture: Secondary | ICD-10-CM | POA: Diagnosis not present

## 2017-09-02 DIAGNOSIS — M62830 Muscle spasm of back: Secondary | ICD-10-CM
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
## 2017-09-04 ENCOUNTER — Ambulatory Visit: Payer: Medicare Other | Admitting: Physical Therapy

## 2017-09-06 ENCOUNTER — Telehealth: Payer: Self-pay | Admitting: Physical Therapy

## 2017-09-06 NOTE — Telephone Encounter (Signed)
Attempted to call patient on 9/26 regarding a missed appointment on 9/25. Phone was busy.

## 2017-09-09 ENCOUNTER — Ambulatory Visit: Payer: Medicare Other | Admitting: Physical Therapy

## 2017-09-09 ENCOUNTER — Encounter: Payer: Self-pay | Admitting: Physical Therapy

## 2017-09-09 ENCOUNTER — Ambulatory Visit: Payer: Medicare Other | Attending: Family Medicine | Admitting: Physical Therapy

## 2017-09-09 DIAGNOSIS — R29898 Other symptoms and signs involving the musculoskeletal system: Secondary | ICD-10-CM | POA: Diagnosis not present

## 2017-09-09 DIAGNOSIS — R293 Abnormal posture: Secondary | ICD-10-CM | POA: Diagnosis not present

## 2017-09-09 DIAGNOSIS — G8929 Other chronic pain: Secondary | ICD-10-CM | POA: Insufficient documentation

## 2017-09-09 DIAGNOSIS — M25612 Stiffness of left shoulder, not elsewhere classified: Secondary | ICD-10-CM

## 2017-09-09 DIAGNOSIS — M25512 Pain in left shoulder: Secondary | ICD-10-CM | POA: Diagnosis not present

## 2017-09-09 DIAGNOSIS — M62830 Muscle spasm of back: Secondary | ICD-10-CM | POA: Diagnosis not present

## 2017-09-09 DIAGNOSIS — M5386 Other specified dorsopathies, lumbar region: Secondary | ICD-10-CM | POA: Insufficient documentation

## 2017-09-09 DIAGNOSIS — R262 Difficulty in walking, not elsewhere classified: Secondary | ICD-10-CM | POA: Diagnosis not present

## 2017-09-09 DIAGNOSIS — M545 Low back pain: Secondary | ICD-10-CM | POA: Insufficient documentation

## 2017-09-09 DIAGNOSIS — M6281 Muscle weakness (generalized): Secondary | ICD-10-CM | POA: Insufficient documentation

## 2017-09-09 NOTE — Patient Instructions (Signed)
Over Head Pull: Narrow Grip       On back, knees bent, feet flat, band across thighs, elbows straight but relaxed. Pull hands apart (start). Keeping elbows straight, bring arms up and over head, hands toward floor. Keep pull steady on band. Hold momentarily. Return slowly, keeping pull steady, back to start. Repeat __10-20_ times. Band color ___RED___   Side Pull: Double Arm   On back, knees bent, feet flat. Arms perpendicular to body, shoulder level, elbows straight but relaxed. Pull arms out to sides, elbows straight. Resistance band comes across collarbones, hands toward floor. Hold momentarily. Slowly return to starting position. Repeat _10-20__ times. Band color ___RED__   Sash   On back, knees bent, feet flat, left hand on left hip, right hand above left. Pull right arm DIAGONALLY (hip to shoulder) across chest. Bring right arm along head toward floor. Hold momentarily. Slowly return to starting position. Repeat _10-20__ times. Do with left arm. Band color ___RED___   Shoulder Rotation: Double Arm   On back, knees bent, feet flat, elbows tucked at sides, bent 90, hands palms up. Pull hands apart and down toward floor, keeping elbows near sides. Hold momentarily. Slowly return to starting position. Repeat _10-20__ times. Band color ___RED___      
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
## 2017-09-10 DIAGNOSIS — M546 Pain in thoracic spine: Secondary | ICD-10-CM | POA: Diagnosis not present

## 2017-09-10 DIAGNOSIS — M5415 Radiculopathy, thoracolumbar region: Secondary | ICD-10-CM | POA: Diagnosis not present

## 2017-09-10 DIAGNOSIS — M47817 Spondylosis without myelopathy or radiculopathy, lumbosacral region: Secondary | ICD-10-CM | POA: Diagnosis not present

## 2017-09-10 DIAGNOSIS — M5416 Radiculopathy, lumbar region: Secondary | ICD-10-CM | POA: Diagnosis not present

## 2017-09-10 DIAGNOSIS — M542 Cervicalgia: Secondary | ICD-10-CM | POA: Diagnosis not present

## 2017-09-11 ENCOUNTER — Encounter: Payer: Medicare Other | Admitting: Physical Therapy

## 2017-09-11 DIAGNOSIS — M5415 Radiculopathy, thoracolumbar region: Secondary | ICD-10-CM | POA: Diagnosis not present

## 2017-09-11 DIAGNOSIS — M5416 Radiculopathy, lumbar region: Secondary | ICD-10-CM | POA: Diagnosis not present

## 2017-09-11 DIAGNOSIS — M546 Pain in thoracic spine: Secondary | ICD-10-CM | POA: Diagnosis not present

## 2017-09-11 DIAGNOSIS — M542 Cervicalgia: Secondary | ICD-10-CM | POA: Diagnosis not present

## 2017-09-11 DIAGNOSIS — M47817 Spondylosis without myelopathy or radiculopathy, lumbosacral region: Secondary | ICD-10-CM | POA: Diagnosis not present

## 2017-09-13 ENCOUNTER — Ambulatory Visit: Payer: Medicare Other

## 2017-09-16 ENCOUNTER — Encounter: Payer: Self-pay | Admitting: Physical Therapy

## 2017-09-16 ENCOUNTER — Ambulatory Visit: Payer: Medicare Other | Admitting: Physical Therapy

## 2017-09-16 DIAGNOSIS — M6281 Muscle weakness (generalized): Secondary | ICD-10-CM | POA: Diagnosis not present

## 2017-09-16 DIAGNOSIS — R293 Abnormal posture: Secondary | ICD-10-CM | POA: Diagnosis not present

## 2017-09-16 DIAGNOSIS — M25612 Stiffness of left shoulder, not elsewhere classified: Secondary | ICD-10-CM

## 2017-09-16 DIAGNOSIS — R29898 Other symptoms and signs involving the musculoskeletal system: Secondary | ICD-10-CM | POA: Diagnosis not present

## 2017-09-16 DIAGNOSIS — G8929 Other chronic pain: Secondary | ICD-10-CM

## 2017-09-16 DIAGNOSIS — M25512 Pain in left shoulder: Secondary | ICD-10-CM | POA: Diagnosis not present

## 2017-09-16 DIAGNOSIS — M545 Low back pain: Secondary | ICD-10-CM

## 2017-09-16 DIAGNOSIS — M62830 Muscle spasm of back: Secondary | ICD-10-CM | POA: Diagnosis not present

## 2017-09-16 DIAGNOSIS — M5386 Other specified dorsopathies, lumbar region: Secondary | ICD-10-CM | POA: Diagnosis not present

## 2017-09-16 DIAGNOSIS — R262 Difficulty in walking, not elsewhere classified: Secondary | ICD-10-CM | POA: Diagnosis not present
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
## 2017-09-18 ENCOUNTER — Ambulatory Visit: Payer: Medicare Other | Admitting: Physical Therapy

## 2017-09-18 VITALS — BP 161/87 | HR 90

## 2017-09-18 DIAGNOSIS — M25512 Pain in left shoulder: Secondary | ICD-10-CM | POA: Diagnosis not present

## 2017-09-18 DIAGNOSIS — R262 Difficulty in walking, not elsewhere classified: Secondary | ICD-10-CM | POA: Diagnosis not present

## 2017-09-18 DIAGNOSIS — R293 Abnormal posture: Secondary | ICD-10-CM

## 2017-09-18 DIAGNOSIS — R29898 Other symptoms and signs involving the musculoskeletal system: Secondary | ICD-10-CM | POA: Diagnosis not present

## 2017-09-18 DIAGNOSIS — M6281 Muscle weakness (generalized): Secondary | ICD-10-CM | POA: Diagnosis not present

## 2017-09-18 DIAGNOSIS — G8929 Other chronic pain: Secondary | ICD-10-CM

## 2017-09-18 DIAGNOSIS — M62830 Muscle spasm of back: Secondary | ICD-10-CM | POA: Diagnosis not present

## 2017-09-18 DIAGNOSIS — M545 Low back pain: Secondary | ICD-10-CM

## 2017-09-18 DIAGNOSIS — M25612 Stiffness of left shoulder, not elsewhere classified: Secondary | ICD-10-CM | POA: Diagnosis not present

## 2017-09-18 DIAGNOSIS — M5386 Other specified dorsopathies, lumbar region: Secondary | ICD-10-CM | POA: Diagnosis not present

## 2017-09-18 NOTE — Patient Instructions (Signed)
Levator Stretch   Grasp seat or sit on hand on side to be stretched. Turn head toward other side and look down. Use hand on head to gently stretch neck in that position. Hold ___30_ seconds. Repeat on other side. Repeat _3___ times. Do _2___ sessions per day.  http://gt2.exer.us/30   Copyright  VHI. All rights reserved.  Side-Bending   One hand on opposite side of head, pull head to side as far as is comfortable. Stop if there is pain. Hold _30___ seconds. Repeat with other hand to other side. Repeat __3__ times. Do ___2_ sessions per day.     
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
## 2017-09-23 ENCOUNTER — Ambulatory Visit: Payer: Medicare Other | Admitting: Physical Therapy

## 2017-09-23 ENCOUNTER — Encounter: Payer: Self-pay | Admitting: Physical Therapy

## 2017-09-23 DIAGNOSIS — M5386 Other specified dorsopathies, lumbar region: Secondary | ICD-10-CM | POA: Diagnosis not present

## 2017-09-23 DIAGNOSIS — G8929 Other chronic pain: Secondary | ICD-10-CM

## 2017-09-23 DIAGNOSIS — M25512 Pain in left shoulder: Secondary | ICD-10-CM | POA: Diagnosis not present

## 2017-09-23 DIAGNOSIS — M6281 Muscle weakness (generalized): Secondary | ICD-10-CM | POA: Diagnosis not present

## 2017-09-23 DIAGNOSIS — R293 Abnormal posture: Secondary | ICD-10-CM | POA: Diagnosis not present

## 2017-09-23 DIAGNOSIS — M545 Low back pain, unspecified: Secondary | ICD-10-CM

## 2017-09-23 DIAGNOSIS — M62830 Muscle spasm of back: Secondary | ICD-10-CM | POA: Diagnosis not present

## 2017-09-23 DIAGNOSIS — R29898 Other symptoms and signs involving the musculoskeletal system: Secondary | ICD-10-CM | POA: Diagnosis not present

## 2017-09-23 DIAGNOSIS — R262 Difficulty in walking, not elsewhere classified: Secondary | ICD-10-CM | POA: Diagnosis not present

## 2017-09-23 DIAGNOSIS — M25612 Stiffness of left shoulder, not elsewhere classified: Secondary | ICD-10-CM
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
## 2017-09-25 ENCOUNTER — Ambulatory Visit: Payer: Medicare Other | Admitting: Physical Therapy

## 2017-09-25 DIAGNOSIS — G8929 Other chronic pain: Secondary | ICD-10-CM | POA: Diagnosis not present

## 2017-09-25 DIAGNOSIS — M25612 Stiffness of left shoulder, not elsewhere classified: Secondary | ICD-10-CM | POA: Diagnosis not present

## 2017-09-25 DIAGNOSIS — M545 Low back pain, unspecified: Secondary | ICD-10-CM

## 2017-09-25 DIAGNOSIS — M25512 Pain in left shoulder: Secondary | ICD-10-CM | POA: Diagnosis not present

## 2017-09-25 DIAGNOSIS — M6281 Muscle weakness (generalized): Secondary | ICD-10-CM | POA: Diagnosis not present

## 2017-09-25 DIAGNOSIS — R29898 Other symptoms and signs involving the musculoskeletal system: Secondary | ICD-10-CM

## 2017-09-25 DIAGNOSIS — M62830 Muscle spasm of back: Secondary | ICD-10-CM | POA: Diagnosis not present

## 2017-09-25 DIAGNOSIS — R293 Abnormal posture: Secondary | ICD-10-CM | POA: Diagnosis not present

## 2017-09-25 DIAGNOSIS — R262 Difficulty in walking, not elsewhere classified: Secondary | ICD-10-CM

## 2017-09-25 DIAGNOSIS — M5386 Other specified dorsopathies, lumbar region: Secondary | ICD-10-CM | POA: Diagnosis not present
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
## 2017-09-30 ENCOUNTER — Encounter (HOSPITAL_COMMUNITY): Payer: Self-pay | Admitting: *Deleted

## 2017-09-30 ENCOUNTER — Emergency Department (HOSPITAL_COMMUNITY): Payer: Medicare Other

## 2017-09-30 ENCOUNTER — Emergency Department (HOSPITAL_COMMUNITY)
Admission: EM | Admit: 2017-09-30 | Discharge: 2017-09-30 | Disposition: A | Discharge status: Home / Self Care | Payer: Medicare Other | Attending: Emergency Medicine | Admitting: Emergency Medicine

## 2017-09-30 ENCOUNTER — Ambulatory Visit: Payer: Medicare Other | Admitting: Physical Therapy

## 2017-09-30 DIAGNOSIS — I10 Essential (primary) hypertension: Secondary | ICD-10-CM | POA: Diagnosis not present

## 2017-09-30 DIAGNOSIS — Z7982 Long term (current) use of aspirin: Secondary | ICD-10-CM | POA: Insufficient documentation

## 2017-09-30 DIAGNOSIS — S46012A Strain of muscle(s) and tendon(s) of the rotator cuff of left shoulder, initial encounter: Secondary | ICD-10-CM | POA: Diagnosis not present

## 2017-09-30 DIAGNOSIS — M25512 Pain in left shoulder: Secondary | ICD-10-CM | POA: Diagnosis not present

## 2017-09-30 DIAGNOSIS — Y92410 Unspecified street and highway as the place of occurrence of the external cause: Secondary | ICD-10-CM | POA: Insufficient documentation

## 2017-09-30 DIAGNOSIS — Y939 Activity, unspecified: Secondary | ICD-10-CM | POA: Insufficient documentation

## 2017-09-30 DIAGNOSIS — S46812A Strain of other muscles, fascia and tendons at shoulder and upper arm level, left arm, initial encounter: Secondary | ICD-10-CM | POA: Diagnosis not present

## 2017-09-30 DIAGNOSIS — Z79899 Other long term (current) drug therapy: Secondary | ICD-10-CM | POA: Diagnosis not present

## 2017-09-30 DIAGNOSIS — F17210 Nicotine dependence, cigarettes, uncomplicated: Secondary | ICD-10-CM | POA: Diagnosis not present

## 2017-09-30 DIAGNOSIS — S199XXA Unspecified injury of neck, initial encounter: Secondary | ICD-10-CM | POA: Diagnosis not present

## 2017-09-30 DIAGNOSIS — Y999 Unspecified external cause status: Secondary | ICD-10-CM | POA: Insufficient documentation

## 2017-09-30 DIAGNOSIS — E119 Type 2 diabetes mellitus without complications: Secondary | ICD-10-CM | POA: Diagnosis not present

## 2017-09-30 DIAGNOSIS — Z7984 Long term (current) use of oral hypoglycemic drugs: Secondary | ICD-10-CM | POA: Diagnosis not present

## 2017-09-30 DIAGNOSIS — S299XXA Unspecified injury of thorax, initial encounter: Secondary | ICD-10-CM | POA: Diagnosis not present

## 2017-09-30 DIAGNOSIS — M7918 Myalgia, other site: Secondary | ICD-10-CM

## 2017-09-30 DIAGNOSIS — M542 Cervicalgia: Secondary | ICD-10-CM | POA: Diagnosis not present

## 2017-09-30 MED ORDER — NAPHAZOLINE-PHENIRAMINE 0.025-0.3 % OP SOLN: 1.0000 [drp] | OPHTHALMIC | 0 refills | Status: DC | PRN

## 2017-09-30 MED ORDER — METHOCARBAMOL 500 MG PO TABS
750.0000 mg | ORAL_TABLET | Freq: Once | ORAL | Status: AC
  Administered 2017-09-30: 750 mg via ORAL
  Filled 2017-09-30: qty 2

## 2017-09-30 MED ORDER — METHOCARBAMOL 500 MG PO TABS: 500.0000 mg | ORAL_TABLET | Freq: Two times a day (BID) | ORAL | 0 refills | Status: DC

## 2017-09-30 NOTE — ED Notes (Signed)
Patient transported to X-ray 
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
## 2017-09-30 NOTE — ED Notes (Signed)
Pt verbalized understanding discharge instructions and denies any further needs or questions at this time. VS stable, ambulatory and steady gait.   
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
## 2017-10-02 ENCOUNTER — Ambulatory Visit: Payer: Medicare Other | Admitting: Physical Therapy

## 2017-10-02 DIAGNOSIS — M542 Cervicalgia: Secondary | ICD-10-CM | POA: Diagnosis not present

## 2017-10-07 ENCOUNTER — Ambulatory Visit: Payer: Medicare Other | Admitting: Physical Therapy

## 2017-10-07 ENCOUNTER — Telehealth: Payer: Self-pay | Admitting: Physical Therapy

## 2017-10-07 NOTE — Telephone Encounter (Signed)
Patient contacted regarding 2nd missed visit. He requested therapy cancel reaming visits. He may call to re-schedule when able.

## 2017-10-09 ENCOUNTER — Ambulatory Visit: Payer: Medicare Other | Admitting: Physical Therapy

## 2017-10-10 DIAGNOSIS — M47817 Spondylosis without myelopathy or radiculopathy, lumbosacral region: Secondary | ICD-10-CM | POA: Diagnosis not present

## 2017-10-10 DIAGNOSIS — M546 Pain in thoracic spine: Secondary | ICD-10-CM | POA: Diagnosis not present

## 2017-10-10 DIAGNOSIS — M5415 Radiculopathy, thoracolumbar region: Secondary | ICD-10-CM | POA: Diagnosis not present

## 2017-10-10 DIAGNOSIS — M542 Cervicalgia: Secondary | ICD-10-CM | POA: Diagnosis not present

## 2017-10-10 DIAGNOSIS — M5416 Radiculopathy, lumbar region: Secondary | ICD-10-CM | POA: Diagnosis not present

## 2017-10-14 ENCOUNTER — Encounter: Payer: Medicare Other | Admitting: Physical Therapy

## 2017-10-14 DIAGNOSIS — R1011 Right upper quadrant pain: Secondary | ICD-10-CM | POA: Diagnosis not present

## 2017-10-14 DIAGNOSIS — R140 Abdominal distension (gaseous): Secondary | ICD-10-CM | POA: Diagnosis not present

## 2017-10-16 ENCOUNTER — Encounter: Payer: Medicare Other | Admitting: Physical Therapy

## 2017-10-21 ENCOUNTER — Encounter: Payer: Medicare Other | Admitting: Physical Therapy

## 2017-10-23 ENCOUNTER — Encounter: Payer: Medicare Other | Admitting: Physical Therapy

## 2017-11-02 DIAGNOSIS — M542 Cervicalgia: Secondary | ICD-10-CM | POA: Diagnosis not present

## 2017-11-06 ENCOUNTER — Other Ambulatory Visit: Payer: Self-pay | Admitting: *Deleted

## 2017-11-06 MED ORDER — TAMSULOSIN HCL 0.4 MG PO CAPS: 0.4000 mg | ORAL_CAPSULE | Freq: Every day | ORAL | 0 refills | Status: DC

## 2017-11-14 DIAGNOSIS — M1711 Unilateral primary osteoarthritis, right knee: Secondary | ICD-10-CM | POA: Diagnosis not present

## 2017-11-14 DIAGNOSIS — M1712 Unilateral primary osteoarthritis, left knee: Secondary | ICD-10-CM | POA: Diagnosis not present

## 2017-11-22 ENCOUNTER — Other Ambulatory Visit: Payer: Self-pay | Admitting: Family Medicine

## 2017-11-25 ENCOUNTER — Ambulatory Visit: Payer: Medicare Other | Admitting: Family Medicine

## 2017-11-29 ENCOUNTER — Other Ambulatory Visit: Payer: Self-pay | Admitting: Family Medicine

## 2017-11-29 DIAGNOSIS — E785 Hyperlipidemia, unspecified: Secondary | ICD-10-CM

## 2017-12-02 DIAGNOSIS — M542 Cervicalgia: Secondary | ICD-10-CM | POA: Diagnosis not present

## 2017-12-13 ENCOUNTER — Telehealth: Payer: Self-pay | Admitting: *Deleted

## 2017-12-13 NOTE — Telephone Encounter (Signed)
-----   Message from Renne Muscaaniel L Warden, MD sent at 11/29/2017 11:24 AM EST ----- Please call patient to come in for cholesterol check to see if he needs a statin.   Thank you, Rande Bruntaniel L. Myrtie SomanWarden, MD [facility] Resident PGY-2 11/29/2017 11:25 AM

## 2017-12-13 NOTE — Telephone Encounter (Signed)
Tried to contact pt to inform him of below.  If he calls back please assist him in scheduling this visit. Lab order is in already. Lamonte SakaiZimmerman Rumple, Brian Dominguez, [place]

## 2017-12-18 DIAGNOSIS — M546 Pain in thoracic spine: Secondary | ICD-10-CM | POA: Diagnosis not present

## 2017-12-18 DIAGNOSIS — M47817 Spondylosis without myelopathy or radiculopathy, lumbosacral region: Secondary | ICD-10-CM | POA: Diagnosis not present

## 2017-12-18 DIAGNOSIS — M542 Cervicalgia: Secondary | ICD-10-CM | POA: Diagnosis not present

## 2017-12-18 DIAGNOSIS — M5415 Radiculopathy, thoracolumbar region: Secondary | ICD-10-CM | POA: Diagnosis not present

## 2017-12-18 DIAGNOSIS — M5416 Radiculopathy, lumbar region: Secondary | ICD-10-CM | POA: Diagnosis not present

## 2017-12-20 DIAGNOSIS — M542 Cervicalgia: Secondary | ICD-10-CM | POA: Diagnosis not present

## 2017-12-20 DIAGNOSIS — M546 Pain in thoracic spine: Secondary | ICD-10-CM | POA: Diagnosis not present

## 2017-12-20 DIAGNOSIS — M5416 Radiculopathy, lumbar region: Secondary | ICD-10-CM | POA: Diagnosis not present

## 2017-12-20 DIAGNOSIS — M5415 Radiculopathy, thoracolumbar region: Secondary | ICD-10-CM | POA: Diagnosis not present

## 2017-12-20 DIAGNOSIS — M47817 Spondylosis without myelopathy or radiculopathy, lumbosacral region: Secondary | ICD-10-CM | POA: Diagnosis not present

## 2017-12-24 NOTE — Telephone Encounter (Signed)
Tried to contact pt and phone only rang and then stopped with no option to leave VM.  If pt calls back please assist him in getting an appointment set up with Dr. Myrtie SomanWarden so that we can get his lab collected. Lamonte SakaiZimmerman Rumple, April D, [place]

## 2017-12-31 DIAGNOSIS — M1712 Unilateral primary osteoarthritis, left knee: Secondary | ICD-10-CM | POA: Diagnosis not present

## 2017-12-31 DIAGNOSIS — M1711 Unilateral primary osteoarthritis, right knee: Secondary | ICD-10-CM | POA: Diagnosis not present

## 2018-01-02 DIAGNOSIS — M542 Cervicalgia: Secondary | ICD-10-CM | POA: Diagnosis not present

## 2018-01-04 ENCOUNTER — Other Ambulatory Visit: Payer: Self-pay | Admitting: Family Medicine

## 2018-01-09 NOTE — Telephone Encounter (Signed)
attempted to call, lm again.    Dr. Myrtie SomanWarden, Would you like to mail a letter? Tia Gelb, Maryjo RochesterJessica Dawn, CMA

## 2018-01-10 NOTE — Telephone Encounter (Signed)
Sure that'd be great.   Thanks, Rande Bruntaniel L. Myrtie SomanWarden, MD [facility] Resident PGY-2 01/10/2018 11:31 AM

## 2018-01-14 ENCOUNTER — Encounter (HOSPITAL_COMMUNITY): Payer: Self-pay

## 2018-01-14 ENCOUNTER — Emergency Department (HOSPITAL_COMMUNITY)
Admission: EM | Admit: 2018-01-14 | Discharge: 2018-01-14 | Disposition: A | Discharge status: Home / Self Care | Payer: Medicare Other | Attending: Physician Assistant | Admitting: Physician Assistant

## 2018-01-14 ENCOUNTER — Emergency Department (HOSPITAL_COMMUNITY): Payer: Medicare Other

## 2018-01-14 ENCOUNTER — Other Ambulatory Visit: Payer: Self-pay

## 2018-01-14 DIAGNOSIS — E119 Type 2 diabetes mellitus without complications: Secondary | ICD-10-CM | POA: Diagnosis not present

## 2018-01-14 DIAGNOSIS — R109 Unspecified abdominal pain: Secondary | ICD-10-CM | POA: Diagnosis present

## 2018-01-14 DIAGNOSIS — I10 Essential (primary) hypertension: Secondary | ICD-10-CM | POA: Diagnosis not present

## 2018-01-14 DIAGNOSIS — Z7982 Long term (current) use of aspirin: Secondary | ICD-10-CM | POA: Insufficient documentation

## 2018-01-14 DIAGNOSIS — F17210 Nicotine dependence, cigarettes, uncomplicated: Secondary | ICD-10-CM | POA: Diagnosis not present

## 2018-01-14 DIAGNOSIS — R1013 Epigastric pain: Secondary | ICD-10-CM | POA: Diagnosis not present

## 2018-01-14 DIAGNOSIS — R1084 Generalized abdominal pain: Secondary | ICD-10-CM

## 2018-01-14 DIAGNOSIS — Z79899 Other long term (current) drug therapy: Secondary | ICD-10-CM | POA: Diagnosis not present

## 2018-01-14 DIAGNOSIS — Z7984 Long term (current) use of oral hypoglycemic drugs: Secondary | ICD-10-CM | POA: Insufficient documentation

## 2018-01-14 LAB — CBC WITH DIFFERENTIAL/PLATELET
BASOS ABS: 0 10*3/uL (ref 0.0–0.1)
Basophils Relative: 0 %
Eosinophils Absolute: 0.2 10*3/uL (ref 0.0–0.7)
Eosinophils Relative: 5 %
HEMATOCRIT: 42.5 % (ref 39.0–52.0)
HEMOGLOBIN: 14.7 g/dL (ref 13.0–17.0)
LYMPHS PCT: 36 %
Lymphs Abs: 1.7 10*3/uL (ref 0.7–4.0)
MCH: 28.1 pg (ref 26.0–34.0)
MCHC: 34.6 g/dL (ref 30.0–36.0)
MCV: 81.3 fL (ref 78.0–100.0)
MONO ABS: 0.4 10*3/uL (ref 0.1–1.0)
Monocytes Relative: 9 %
NEUTROS ABS: 2.3 10*3/uL (ref 1.7–7.7)
NEUTROS PCT: 50 %
Platelets: 253 10*3/uL (ref 150–400)
RBC: 5.23 MIL/uL (ref 4.22–5.81)
RDW: 13.6 % (ref 11.5–15.5)
WBC: 4.6 10*3/uL (ref 4.0–10.5)

## 2018-01-14 LAB — HEPATIC FUNCTION PANEL
ALBUMIN: 4.2 g/dL (ref 3.5–5.0)
ALK PHOS: 56 U/L (ref 38–126)
ALT: 17 U/L (ref 17–63)
AST: 22 U/L (ref 15–41)
BILIRUBIN TOTAL: 0.7 mg/dL (ref 0.3–1.2)
Bilirubin, Direct: 0.2 mg/dL (ref 0.1–0.5)
Indirect Bilirubin: 0.5 mg/dL (ref 0.3–0.9)
TOTAL PROTEIN: 7.6 g/dL (ref 6.5–8.1)

## 2018-01-14 LAB — BASIC METABOLIC PANEL
ANION GAP: 12 (ref 5–15)
BUN: 11 mg/dL (ref 6–20)
CHLORIDE: 100 mmol/L — AB (ref 101–111)
CO2: 26 mmol/L (ref 22–32)
Calcium: 9.7 mg/dL (ref 8.9–10.3)
Creatinine, Ser: 0.93 mg/dL (ref 0.61–1.24)
GFR calc Af Amer: >60 mL/min (ref >60)
GFR calc non Af Amer: >60 mL/min (ref >60)
GLUCOSE: 166 mg/dL — AB (ref 65–99)
POTASSIUM: 3.8 mmol/L (ref 3.5–5.1)
Sodium: 138 mmol/L (ref 135–145)

## 2018-01-14 LAB — LIPASE, BLOOD: Lipase: 66 U/L — ABNORMAL HIGH (ref 11–51)

## 2018-01-14 MED ORDER — DICYCLOMINE HCL 10 MG PO CAPS
10.0000 mg | ORAL_CAPSULE | Freq: Once | ORAL | Status: AC
  Administered 2018-01-14: 10 mg via ORAL
  Filled 2018-01-14: qty 1

## 2018-01-14 MED ORDER — DICYCLOMINE HCL 20 MG PO TABS: 20.0000 mg | ORAL_TABLET | Freq: Two times a day (BID) | ORAL | 0 refills | Status: DC

## 2018-01-14 MED ORDER — IOPAMIDOL (ISOVUE-300) INJECTION 61%
INTRAVENOUS | Status: AC
  Administered 2018-01-14: 100 mL
  Filled 2018-01-14: qty 100

## 2018-01-14 NOTE — ED Notes (Signed)
Patient given discharge instructions and verbalized understanding.  Patient stable to discharge at this time.  Patient is alert and oriented to baseline.  No distressed noted at this time.  All belongings taken with the patient at discharge.   

## 2018-01-14 NOTE — ED Notes (Signed)
Labs reviewed. Patient upset about wait time. Encouraged to wait since he has already been here so long.

## 2018-01-14 NOTE — Discharge Instructions (Signed)
Your CAT scan was reassuring.  Your labs are also reassuring.  We talked about how your pancreas enzyme was a little bit elevated.  Please follow-up with your gastroenterologist as planned.  We have given you Bentyl, which will help with abdominal cramping.
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
## 2018-01-14 NOTE — ED Triage Notes (Signed)
Pt presents with 2 week h/o R sided abdominal pain that has been constant and is now generalized.  Pt describes as a burning, reports similar to GERD symptoms but worse.

## 2018-01-15 DIAGNOSIS — M5415 Radiculopathy, thoracolumbar region: Secondary | ICD-10-CM | POA: Diagnosis not present

## 2018-01-15 DIAGNOSIS — M542 Cervicalgia: Secondary | ICD-10-CM | POA: Diagnosis not present

## 2018-01-15 DIAGNOSIS — M5416 Radiculopathy, lumbar region: Secondary | ICD-10-CM | POA: Diagnosis not present

## 2018-01-15 DIAGNOSIS — M47817 Spondylosis without myelopathy or radiculopathy, lumbosacral region: Secondary | ICD-10-CM | POA: Diagnosis not present

## 2018-01-15 DIAGNOSIS — M546 Pain in thoracic spine: Secondary | ICD-10-CM | POA: Diagnosis not present

## 2018-01-16 DIAGNOSIS — M5415 Radiculopathy, thoracolumbar region: Secondary | ICD-10-CM | POA: Diagnosis not present

## 2018-01-16 DIAGNOSIS — M546 Pain in thoracic spine: Secondary | ICD-10-CM | POA: Diagnosis not present

## 2018-01-16 DIAGNOSIS — M5416 Radiculopathy, lumbar region: Secondary | ICD-10-CM | POA: Diagnosis not present

## 2018-01-16 DIAGNOSIS — M47817 Spondylosis without myelopathy or radiculopathy, lumbosacral region: Secondary | ICD-10-CM | POA: Diagnosis not present

## 2018-01-16 DIAGNOSIS — M542 Cervicalgia: Secondary | ICD-10-CM | POA: Diagnosis not present

## 2018-01-28 DIAGNOSIS — M546 Pain in thoracic spine: Secondary | ICD-10-CM | POA: Diagnosis not present

## 2018-01-28 DIAGNOSIS — M9903 Segmental and somatic dysfunction of lumbar region: Secondary | ICD-10-CM | POA: Diagnosis not present

## 2018-01-28 DIAGNOSIS — M542 Cervicalgia: Secondary | ICD-10-CM | POA: Diagnosis not present

## 2018-01-28 DIAGNOSIS — M47817 Spondylosis without myelopathy or radiculopathy, lumbosacral region: Secondary | ICD-10-CM | POA: Diagnosis not present

## 2018-01-28 DIAGNOSIS — M5415 Radiculopathy, thoracolumbar region: Secondary | ICD-10-CM | POA: Diagnosis not present

## 2018-01-28 DIAGNOSIS — M5416 Radiculopathy, lumbar region: Secondary | ICD-10-CM | POA: Diagnosis not present

## 2018-02-02 DIAGNOSIS — M542 Cervicalgia: Secondary | ICD-10-CM | POA: Diagnosis not present

## 2018-02-03 ENCOUNTER — Emergency Department (HOSPITAL_COMMUNITY)
Admission: EM | Admit: 2018-02-03 | Discharge: 2018-02-03 | Disposition: A | Discharge status: Home / Self Care | Payer: Medicare Other | Attending: Emergency Medicine | Admitting: Emergency Medicine

## 2018-02-03 ENCOUNTER — Other Ambulatory Visit: Payer: Self-pay

## 2018-02-03 ENCOUNTER — Encounter (HOSPITAL_COMMUNITY): Payer: Self-pay

## 2018-02-03 ENCOUNTER — Emergency Department (HOSPITAL_COMMUNITY): Payer: Medicare Other

## 2018-02-03 DIAGNOSIS — Z79899 Other long term (current) drug therapy: Secondary | ICD-10-CM | POA: Diagnosis not present

## 2018-02-03 DIAGNOSIS — R109 Unspecified abdominal pain: Secondary | ICD-10-CM

## 2018-02-03 DIAGNOSIS — Z96612 Presence of left artificial shoulder joint: Secondary | ICD-10-CM | POA: Insufficient documentation

## 2018-02-03 DIAGNOSIS — F17210 Nicotine dependence, cigarettes, uncomplicated: Secondary | ICD-10-CM | POA: Diagnosis not present

## 2018-02-03 DIAGNOSIS — E119 Type 2 diabetes mellitus without complications: Secondary | ICD-10-CM | POA: Insufficient documentation

## 2018-02-03 DIAGNOSIS — Z7982 Long term (current) use of aspirin: Secondary | ICD-10-CM | POA: Insufficient documentation

## 2018-02-03 DIAGNOSIS — I10 Essential (primary) hypertension: Secondary | ICD-10-CM | POA: Insufficient documentation

## 2018-02-03 DIAGNOSIS — R079 Chest pain, unspecified: Secondary | ICD-10-CM | POA: Diagnosis not present

## 2018-02-03 DIAGNOSIS — R1013 Epigastric pain: Secondary | ICD-10-CM | POA: Insufficient documentation

## 2018-02-03 DIAGNOSIS — K219 Gastro-esophageal reflux disease without esophagitis: Secondary | ICD-10-CM | POA: Diagnosis not present

## 2018-02-03 DIAGNOSIS — Z7984 Long term (current) use of oral hypoglycemic drugs: Secondary | ICD-10-CM | POA: Diagnosis not present

## 2018-02-03 LAB — CBC
HCT: 41.5 % (ref 39.0–52.0)
Hemoglobin: 14.5 g/dL (ref 13.0–17.0)
MCH: 28.5 pg (ref 26.0–34.0)
MCHC: 34.9 g/dL (ref 30.0–36.0)
MCV: 81.7 fL (ref 78.0–100.0)
PLATELETS: 276 10*3/uL (ref 150–400)
RBC: 5.08 MIL/uL (ref 4.22–5.81)
RDW: 13.5 % (ref 11.5–15.5)
WBC: 5.6 10*3/uL (ref 4.0–10.5)

## 2018-02-03 LAB — COMPREHENSIVE METABOLIC PANEL
ALBUMIN: 4.1 g/dL (ref 3.5–5.0)
ALK PHOS: 58 U/L (ref 38–126)
ALT: 15 U/L — AB (ref 17–63)
AST: 18 U/L (ref 15–41)
Anion gap: 12 (ref 5–15)
BUN: 12 mg/dL (ref 6–20)
CALCIUM: 9.5 mg/dL (ref 8.9–10.3)
CO2: 25 mmol/L (ref 22–32)
CREATININE: 0.87 mg/dL (ref 0.61–1.24)
Chloride: 101 mmol/L (ref 101–111)
GFR calc Af Amer: >60 mL/min (ref >60)
GFR calc non Af Amer: >60 mL/min (ref >60)
GLUCOSE: 175 mg/dL — AB (ref 65–99)
Potassium: 3.6 mmol/L (ref 3.5–5.1)
SODIUM: 138 mmol/L (ref 135–145)
TOTAL PROTEIN: 7.2 g/dL (ref 6.5–8.1)
Total Bilirubin: 0.5 mg/dL (ref 0.3–1.2)

## 2018-02-03 LAB — LIPASE, BLOOD: Lipase: 45 U/L (ref 11–51)

## 2018-02-03 LAB — I-STAT TROPONIN, ED: TROPONIN I, POC: 0 ng/mL (ref 0.00–0.08)

## 2018-02-03 MED ORDER — IPRATROPIUM BROMIDE 0.02 % IN SOLN
0.5000 mg | Freq: Once | RESPIRATORY_TRACT | Status: AC
  Administered 2018-02-03: 0.5 mg via RESPIRATORY_TRACT
  Filled 2018-02-03: qty 2.5

## 2018-02-03 MED ORDER — ALBUTEROL SULFATE (2.5 MG/3ML) 0.083% IN NEBU
5.0000 mg | INHALATION_SOLUTION | Freq: Once | RESPIRATORY_TRACT | Status: AC
  Administered 2018-02-03: 5 mg via RESPIRATORY_TRACT
  Filled 2018-02-03: qty 6

## 2018-02-03 MED ORDER — SODIUM CHLORIDE 0.9 % IV BOLUS (SEPSIS)
1000.0000 mL | Freq: Once | INTRAVENOUS | Status: AC
  Administered 2018-02-03: 1000 mL via INTRAVENOUS

## 2018-02-03 MED ORDER — MORPHINE SULFATE (PF) 4 MG/ML IV SOLN
4.0000 mg | Freq: Once | INTRAVENOUS | Status: AC
  Administered 2018-02-03: 4 mg via INTRAVENOUS
  Filled 2018-02-03: qty 1

## 2018-02-03 MED ORDER — DICYCLOMINE HCL 20 MG PO TABS: 20.0000 mg | ORAL_TABLET | Freq: Two times a day (BID) | ORAL | 0 refills | Status: DC | PRN

## 2018-02-03 MED ORDER — FAMOTIDINE 20 MG IN NS 100 ML IVPB
20.0000 mg | Freq: Once | INTRAVENOUS | Status: AC
  Administered 2018-02-03: 20 mg via INTRAVENOUS
  Filled 2018-02-03: qty 100

## 2018-02-03 MED ORDER — GI COCKTAIL ~~LOC~~
30.0000 mL | Freq: Once | ORAL | Status: AC
  Administered 2018-02-03: 30 mL via ORAL
  Filled 2018-02-03: qty 30

## 2018-02-03 MED ORDER — DEXLANSOPRAZOLE 30 MG PO CPDR: 30.0000 mg | DELAYED_RELEASE_CAPSULE | Freq: Every day | ORAL | 0 refills | Status: DC

## 2018-02-03 NOTE — ED Triage Notes (Signed)
Pt presents to the ed with complaints of abdominal pain, chest pain and palpitations x 3 days. Reports nausea, denies vomiting or back pain. Hx: of acid reflux reports this does not feel the same.
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
## 2018-02-03 NOTE — Discharge Instructions (Signed)
Stop prilosec, take dexilant instead.   Take bentyl as needed for cramps  Call Dr. Elnoria HowardHung today to get appointment in a week. He will evaluate you and can perform testing as needed   Return to ER if you have worse abdominal pain, vomiting, fever, trouble urinating.

## 2018-02-03 NOTE — ED Notes (Signed)
Pepcid stopped. 100 ml administered

## 2018-02-05 DIAGNOSIS — M546 Pain in thoracic spine: Secondary | ICD-10-CM | POA: Diagnosis not present

## 2018-02-05 DIAGNOSIS — M47814 Spondylosis without myelopathy or radiculopathy, thoracic region: Secondary | ICD-10-CM | POA: Diagnosis not present

## 2018-02-05 DIAGNOSIS — M9902 Segmental and somatic dysfunction of thoracic region: Secondary | ICD-10-CM | POA: Diagnosis not present

## 2018-02-18 DIAGNOSIS — M1711 Unilateral primary osteoarthritis, right knee: Secondary | ICD-10-CM | POA: Diagnosis not present

## 2018-02-18 DIAGNOSIS — M47816 Spondylosis without myelopathy or radiculopathy, lumbar region: Secondary | ICD-10-CM | POA: Diagnosis not present

## 2018-02-18 DIAGNOSIS — M1712 Unilateral primary osteoarthritis, left knee: Secondary | ICD-10-CM | POA: Diagnosis not present

## 2018-02-25 DIAGNOSIS — M1711 Unilateral primary osteoarthritis, right knee: Secondary | ICD-10-CM | POA: Diagnosis not present

## 2018-02-25 DIAGNOSIS — M1712 Unilateral primary osteoarthritis, left knee: Secondary | ICD-10-CM | POA: Diagnosis not present

## 2018-03-02 DIAGNOSIS — M542 Cervicalgia: Secondary | ICD-10-CM | POA: Diagnosis not present

## 2018-03-04 DIAGNOSIS — M1712 Unilateral primary osteoarthritis, left knee: Secondary | ICD-10-CM | POA: Diagnosis not present

## 2018-03-04 DIAGNOSIS — M1711 Unilateral primary osteoarthritis, right knee: Secondary | ICD-10-CM | POA: Diagnosis not present

## 2018-03-12 ENCOUNTER — Other Ambulatory Visit: Payer: Self-pay | Admitting: *Deleted

## 2018-03-12 DIAGNOSIS — J309 Allergic rhinitis, unspecified: Secondary | ICD-10-CM

## 2018-03-13 ENCOUNTER — Other Ambulatory Visit: Payer: Self-pay

## 2018-03-13 DIAGNOSIS — M47816 Spondylosis without myelopathy or radiculopathy, lumbar region: Secondary | ICD-10-CM | POA: Diagnosis not present

## 2018-03-13 DIAGNOSIS — J309 Allergic rhinitis, unspecified: Secondary | ICD-10-CM

## 2018-03-13 MED ORDER — FLUTICASONE PROPIONATE 50 MCG/ACT NA SUSP: 2.0000 | Freq: Every day | NASAL | 11 refills | Status: DC | PRN

## 2018-03-27 ENCOUNTER — Other Ambulatory Visit: Payer: Self-pay

## 2018-03-27 ENCOUNTER — Encounter: Payer: Self-pay | Admitting: Internal Medicine

## 2018-03-27 ENCOUNTER — Ambulatory Visit (INDEPENDENT_AMBULATORY_CARE_PROVIDER_SITE_OTHER): Payer: Medicare Other | Admitting: Internal Medicine

## 2018-03-27 VITALS — BP 152/72 | HR 105 | Temp 98.3°F | Ht 73.0 in | Wt 239.0 lb

## 2018-03-27 DIAGNOSIS — R5383 Other fatigue: Secondary | ICD-10-CM | POA: Diagnosis not present

## 2018-03-27 DIAGNOSIS — I10 Essential (primary) hypertension: Secondary | ICD-10-CM | POA: Diagnosis not present

## 2018-03-27 DIAGNOSIS — L84 Corns and callosities: Secondary | ICD-10-CM

## 2018-03-27 DIAGNOSIS — E118 Type 2 diabetes mellitus with unspecified complications: Secondary | ICD-10-CM | POA: Diagnosis not present

## 2018-03-27 DIAGNOSIS — Z794 Long term (current) use of insulin: Secondary | ICD-10-CM

## 2018-03-27 LAB — POCT UA - MICROALBUMIN
Albumin/Creatinine Ratio, Urine, POC: <30
CREATININE, POC: 200 mg/dL
MICROALBUMIN (UR) POC: 80 mg/L

## 2018-03-27 LAB — POCT GLYCOSYLATED HEMOGLOBIN (HGB A1C): HEMOGLOBIN A1C: 8.1

## 2018-03-27 MED ORDER — GLIPIZIDE ER 2.5 MG PO TB24: 2.5000 mg | ORAL_TABLET | Freq: Every day | ORAL | 1 refills | Status: DC

## 2018-03-27 NOTE — Patient Instructions (Signed)
Mr. Brian Dominguez,  Continue metformin 1000 mg twice daily. I have added glipizide 2.5 mg to take before breakfast. Please track morning fasting blood sugars. Check if you feel dizzy or lightheaded.  Please return in a couple weeks to review blood sugars and check blood pressure.  I will refer you to podiatry for your foot calluses.  I will call with vitamin D result.  Continue to cut back on smoking.   Best, Dr. Sampson GoonFitzgerald

## 2018-03-28 LAB — VITAMIN D 25 HYDROXY (VIT D DEFICIENCY, FRACTURES): Vit D, 25-Hydroxy: 19.9 ng/mL — ABNORMAL LOW (ref 30.0–100.0)

## 2018-03-31 ENCOUNTER — Encounter: Payer: Self-pay | Admitting: Internal Medicine

## 2018-03-31 ENCOUNTER — Other Ambulatory Visit: Payer: Self-pay

## 2018-03-31 ENCOUNTER — Other Ambulatory Visit: Payer: Self-pay | Admitting: Internal Medicine

## 2018-03-31 MED ORDER — VITAMIN D 1000 UNITS PO TABS: 1000.0000 [IU] | ORAL_TABLET | Freq: Every day | ORAL | 0 refills | Status: DC

## 2018-03-31 NOTE — Assessment & Plan Note (Addendum)
-   Uncontrolled today, barely above goal of 150/90 (higher for age) - Continue to monitor and recommend follow-up within the month - Recommended smoking cessation

## 2018-03-31 NOTE — Assessment & Plan Note (Addendum)
-   A1c 8.1 compared to 7.7 in 2017. Has ongoing complaint of erectile dysfunction. - Continue metformin 1000 mg BID and will add glipizide 2.5 mg daily with counseling on hypoglycemia symptoms given age. Did not think SGLT2 would be good option despite HLD and HTN because of BPH.  - Will refer to podiatry for foot calluses. - Due for eye exam.  - Albumin/creatinine ration < 30 (also allergic to ACEi)

## 2018-03-31 NOTE — Progress Notes (Signed)
[facility] Progress Note  Subjective:  Brian JakesJohn A Dominguez is a 72 y.o. male with history of T2DM, HTN, tobacco abuse, BPH and chronic back pain who presents to follow-up diabetes.  He is taking metformin 1000 mg BID and denies trouble taking medication. He reports CBGs mostly around 200 (usually fasting). He wonders if he needs a new glucometer because it is a couple years old but says it seems to be working but does not always give the same reading when taken sequentially. He does not drink soda or sweet tea but does like eating fruit. He takes metformin 1000 mg BID. He denies abdominal pain. He reports ongoing trouble with erectile dysfunction. He is not regularly exercising. Due for eye exam.  ROS: Denies abdominal pain, denies vision changes   Allergies  Allergen Reactions  . Enalapril Swelling    Angioedema of the lips with enalapril    Social History   Tobacco Use  . Smoking status: Current Every Day Smoker    Packs/day: 0.25    Years: 35.00    Pack years: 8.75    Types: Cigarettes  . Smokeless tobacco: Never Used  . Tobacco comment: trying to quitt quit for a while 10 yrs smoking now  Substance Use Topics  . Alcohol use: No    Alcohol/week: 0.0 oz    Objective: Blood pressure (!) 152/72, pulse (!) 105, temperature 98.3 F (36.8 C), temperature source Oral, height 6\' 1"  (1.854 m), weight 239 lb (108.4 kg), SpO2 99 %. Body mass index is 31.53 kg/m. Had not yet taken amlodipine today.  Constitutional: Older male in NAD Abdominal: Soft. +BS, NT Musculoskeletal: No LE edema Neurological: AOx3, no focal deficits. Skin: Monofilament testing normal. Moderate heel calluses present and thickened toenails.  Psychiatric: Normal mood and affect.  Vitals reviewed  Assessment/Plan: DM type 2 (diabetes mellitus, type 2) - A1c 8.1 compared to 7.7 in 2017. Has ongoing complaint of erectile dysfunction. - Continue metformin 1000 mg BID and will add glipizide 2.5 mg daily  with counseling on hypoglycemia symptoms given age. Did not think SGLT2 would be good option despite HLD and HTN because of BPH.  - Will refer to podiatry for foot calluses. - Due for eye exam.  - Albumin/creatinine ration < 30 (also allergic to ACEi)  HYPERTENSION, BENIGN SYSTEMIC - Uncontrolled today, barely above goal of 150/90 (higher for age) - Continue to monitor and recommend follow-up within the month - Recommended smoking cessation  Will check vitamin D level per patient request due to fatigue, as well.  Follow-up in about 1 month for BP and CBG monitoring.   Dani GobbleHillary Tyrica Afzal, MD [facility], PGY-3

## 2018-04-02 DIAGNOSIS — M542 Cervicalgia: Secondary | ICD-10-CM | POA: Diagnosis not present

## 2018-04-09 ENCOUNTER — Other Ambulatory Visit: Payer: Self-pay

## 2018-04-10 MED ORDER — TAMSULOSIN HCL 0.4 MG PO CAPS: 0.4000 mg | ORAL_CAPSULE | Freq: Every day | ORAL | 0 refills | Status: DC

## 2018-04-11 DIAGNOSIS — M546 Pain in thoracic spine: Secondary | ICD-10-CM | POA: Diagnosis not present

## 2018-04-11 DIAGNOSIS — M9902 Segmental and somatic dysfunction of thoracic region: Secondary | ICD-10-CM | POA: Diagnosis not present

## 2018-04-11 DIAGNOSIS — M47814 Spondylosis without myelopathy or radiculopathy, thoracic region: Secondary | ICD-10-CM | POA: Diagnosis not present

## 2018-04-14 DIAGNOSIS — M9902 Segmental and somatic dysfunction of thoracic region: Secondary | ICD-10-CM | POA: Diagnosis not present

## 2018-04-14 DIAGNOSIS — M47814 Spondylosis without myelopathy or radiculopathy, thoracic region: Secondary | ICD-10-CM | POA: Diagnosis not present

## 2018-04-14 DIAGNOSIS — M546 Pain in thoracic spine: Secondary | ICD-10-CM | POA: Diagnosis not present

## 2018-04-16 ENCOUNTER — Other Ambulatory Visit: Payer: Self-pay | Admitting: Student in an Organized Health Care Education/Training Program

## 2018-04-18 ENCOUNTER — Other Ambulatory Visit: Payer: Self-pay | Admitting: Internal Medicine

## 2018-04-18 ENCOUNTER — Ambulatory Visit: Payer: Self-pay | Admitting: Podiatry

## 2018-04-20 ENCOUNTER — Other Ambulatory Visit: Payer: Self-pay | Admitting: Student in an Organized Health Care Education/Training Program

## 2018-04-20 MED ORDER — TAMSULOSIN HCL 0.4 MG PO CAPS: 0.4000 mg | ORAL_CAPSULE | Freq: Every day | ORAL | 1 refills | Status: DC

## 2018-04-20 NOTE — Telephone Encounter (Signed)
Fax refill sent

## 2018-05-02 DIAGNOSIS — M542 Cervicalgia: Secondary | ICD-10-CM | POA: Diagnosis not present

## 2018-05-07 DIAGNOSIS — M67911 Unspecified disorder of synovium and tendon, right shoulder: Secondary | ICD-10-CM | POA: Diagnosis not present

## 2018-05-13 DIAGNOSIS — M47814 Spondylosis without myelopathy or radiculopathy, thoracic region: Secondary | ICD-10-CM | POA: Diagnosis not present

## 2018-05-13 DIAGNOSIS — M9902 Segmental and somatic dysfunction of thoracic region: Secondary | ICD-10-CM | POA: Diagnosis not present

## 2018-05-13 DIAGNOSIS — M546 Pain in thoracic spine: Secondary | ICD-10-CM | POA: Diagnosis not present

## 2018-05-19 ENCOUNTER — Ambulatory Visit: Payer: Medicare Other | Attending: Orthopedic Surgery | Admitting: Physical Therapy

## 2018-05-20 ENCOUNTER — Other Ambulatory Visit: Payer: Self-pay

## 2018-05-21 MED ORDER — METFORMIN HCL 1000 MG PO TABS: 1000.0000 mg | ORAL_TABLET | Freq: Two times a day (BID) | ORAL | 2 refills | Status: DC

## 2018-06-02 DIAGNOSIS — M542 Cervicalgia: Secondary | ICD-10-CM | POA: Diagnosis not present

## 2018-06-11 DIAGNOSIS — M9902 Segmental and somatic dysfunction of thoracic region: Secondary | ICD-10-CM | POA: Diagnosis not present

## 2018-06-11 DIAGNOSIS — M546 Pain in thoracic spine: Secondary | ICD-10-CM | POA: Diagnosis not present

## 2018-06-11 DIAGNOSIS — M47814 Spondylosis without myelopathy or radiculopathy, thoracic region: Secondary | ICD-10-CM | POA: Diagnosis not present

## 2018-06-14 ENCOUNTER — Emergency Department (HOSPITAL_COMMUNITY)
Admission: EM | Admit: 2018-06-14 | Discharge: 2018-06-14 | Disposition: A | Discharge status: Home / Self Care | Payer: Medicare Other | Attending: Emergency Medicine | Admitting: Emergency Medicine

## 2018-06-14 ENCOUNTER — Emergency Department (HOSPITAL_COMMUNITY): Payer: Medicare Other

## 2018-06-14 ENCOUNTER — Encounter (HOSPITAL_COMMUNITY): Payer: Self-pay | Admitting: Emergency Medicine

## 2018-06-14 DIAGNOSIS — R1031 Right lower quadrant pain: Secondary | ICD-10-CM | POA: Diagnosis not present

## 2018-06-14 DIAGNOSIS — F17210 Nicotine dependence, cigarettes, uncomplicated: Secondary | ICD-10-CM | POA: Insufficient documentation

## 2018-06-14 DIAGNOSIS — Z96612 Presence of left artificial shoulder joint: Secondary | ICD-10-CM | POA: Insufficient documentation

## 2018-06-14 DIAGNOSIS — Z7984 Long term (current) use of oral hypoglycemic drugs: Secondary | ICD-10-CM | POA: Diagnosis not present

## 2018-06-14 DIAGNOSIS — K439 Ventral hernia without obstruction or gangrene: Secondary | ICD-10-CM | POA: Diagnosis not present

## 2018-06-14 DIAGNOSIS — I10 Essential (primary) hypertension: Secondary | ICD-10-CM | POA: Diagnosis not present

## 2018-06-14 DIAGNOSIS — E119 Type 2 diabetes mellitus without complications: Secondary | ICD-10-CM | POA: Diagnosis not present

## 2018-06-14 DIAGNOSIS — Z7982 Long term (current) use of aspirin: Secondary | ICD-10-CM | POA: Diagnosis not present

## 2018-06-14 DIAGNOSIS — R110 Nausea: Secondary | ICD-10-CM | POA: Diagnosis not present

## 2018-06-14 DIAGNOSIS — R05 Cough: Secondary | ICD-10-CM | POA: Diagnosis not present

## 2018-06-14 DIAGNOSIS — R197 Diarrhea, unspecified: Secondary | ICD-10-CM

## 2018-06-14 DIAGNOSIS — Z79899 Other long term (current) drug therapy: Secondary | ICD-10-CM | POA: Insufficient documentation

## 2018-06-14 LAB — COMPREHENSIVE METABOLIC PANEL
ALT: 16 U/L (ref 0–44)
AST: 20 U/L (ref 15–41)
Albumin: 4.4 g/dL (ref 3.5–5.0)
Alkaline Phosphatase: 52 U/L (ref 38–126)
Anion gap: 11 (ref 5–15)
BUN: 9 mg/dL (ref 8–23)
CHLORIDE: 99 mmol/L (ref 98–111)
CO2: 28 mmol/L (ref 22–32)
CREATININE: 1.04 mg/dL (ref 0.61–1.24)
Calcium: 9.8 mg/dL (ref 8.9–10.3)
GFR calc Af Amer: >60 mL/min (ref >60)
GFR calc non Af Amer: >60 mL/min (ref >60)
Glucose, Bld: 194 mg/dL — ABNORMAL HIGH (ref 70–99)
POTASSIUM: 3.6 mmol/L (ref 3.5–5.1)
Sodium: 138 mmol/L (ref 135–145)
Total Bilirubin: 0.8 mg/dL (ref 0.3–1.2)
Total Protein: 7.6 g/dL (ref 6.5–8.1)

## 2018-06-14 LAB — CBC WITH DIFFERENTIAL/PLATELET
ABS IMMATURE GRANULOCYTES: 0 10*3/uL (ref 0.0–0.1)
BASOS ABS: 0 10*3/uL (ref 0.0–0.1)
BASOS PCT: 0 %
Eosinophils Absolute: 0.3 10*3/uL (ref 0.0–0.7)
Eosinophils Relative: 6 %
HCT: 42.9 % (ref 39.0–52.0)
Hemoglobin: 14.2 g/dL (ref 13.0–17.0)
IMMATURE GRANULOCYTES: 0 %
Lymphocytes Relative: 29 %
Lymphs Abs: 1.5 10*3/uL (ref 0.7–4.0)
MCH: 27.1 pg (ref 26.0–34.0)
MCHC: 33.1 g/dL (ref 30.0–36.0)
MCV: 81.9 fL (ref 78.0–100.0)
Monocytes Absolute: 0.4 10*3/uL (ref 0.1–1.0)
Monocytes Relative: 8 %
NEUTROS ABS: 2.9 10*3/uL (ref 1.7–7.7)
NEUTROS PCT: 57 %
PLATELETS: 277 10*3/uL (ref 150–400)
RBC: 5.24 MIL/uL (ref 4.22–5.81)
RDW: 13.3 % (ref 11.5–15.5)
WBC: 5.1 10*3/uL (ref 4.0–10.5)

## 2018-06-14 LAB — URINALYSIS, ROUTINE W REFLEX MICROSCOPIC
Bacteria, UA: NONE SEEN
Bilirubin Urine: NEGATIVE
Glucose, UA: 150 mg/dL — AB
Hgb urine dipstick: NEGATIVE
KETONES UR: NEGATIVE mg/dL
Nitrite: NEGATIVE
PH: 5 (ref 5.0–8.0)
PROTEIN: NEGATIVE mg/dL
Specific Gravity, Urine: 1.026 (ref 1.005–1.030)

## 2018-06-14 LAB — LIPASE, BLOOD: Lipase: 33 U/L (ref 11–51)

## 2018-06-14 MED ORDER — METOCLOPRAMIDE HCL 10 MG PO TABS: 10.0000 mg | ORAL_TABLET | Freq: Four times a day (QID) | ORAL | 0 refills | Status: DC

## 2018-06-14 MED ORDER — METOCLOPRAMIDE HCL 5 MG/ML IJ SOLN
10.0000 mg | Freq: Once | INTRAMUSCULAR | Status: AC
  Administered 2018-06-14: 10 mg via INTRAVENOUS
  Filled 2018-06-14: qty 2

## 2018-06-14 MED ORDER — IOHEXOL 300 MG/ML  SOLN
100.0000 mL | Freq: Once | INTRAMUSCULAR | Status: AC | PRN
  Administered 2018-06-14: 100 mL via INTRAVENOUS

## 2018-06-14 MED ORDER — CYCLOBENZAPRINE HCL 10 MG PO TABS
5.0000 mg | ORAL_TABLET | Freq: Once | ORAL | Status: AC
  Administered 2018-06-14: 5 mg via ORAL
  Filled 2018-06-14: qty 1

## 2018-06-14 MED ORDER — ONDANSETRON HCL 4 MG/2ML IJ SOLN
4.0000 mg | Freq: Once | INTRAMUSCULAR | Status: AC
  Administered 2018-06-14: 4 mg via INTRAVENOUS
  Filled 2018-06-14: qty 2
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
## 2018-06-14 NOTE — Discharge Instructions (Addendum)
Please eat small, frequent meals to help with bloating Follow up with your doctor Take Reglan as needed for nausea

## 2018-06-14 NOTE — ED Triage Notes (Signed)
Pt reports urinary frequency and burning for the past few days as well as some RLQ abd and right flank pain, denies fevers or chills, endorses some nausea. Denies any hematuria.

## 2018-06-15 LAB — URINE CULTURE: Culture: NO GROWTH

## 2018-06-16 ENCOUNTER — Ambulatory Visit: Payer: Medicare Other | Admitting: Student in an Organized Health Care Education/Training Program

## 2018-06-16 DIAGNOSIS — M47814 Spondylosis without myelopathy or radiculopathy, thoracic region: Secondary | ICD-10-CM | POA: Diagnosis not present

## 2018-06-16 DIAGNOSIS — M546 Pain in thoracic spine: Secondary | ICD-10-CM | POA: Diagnosis not present

## 2018-06-16 DIAGNOSIS — M9902 Segmental and somatic dysfunction of thoracic region: Secondary | ICD-10-CM | POA: Diagnosis not present

## 2018-06-17 ENCOUNTER — Ambulatory Visit: Payer: Medicare Other | Attending: Orthopedic Surgery

## 2018-06-17 ENCOUNTER — Other Ambulatory Visit: Payer: Self-pay

## 2018-06-17 DIAGNOSIS — M5386 Other specified dorsopathies, lumbar region: Secondary | ICD-10-CM | POA: Diagnosis not present

## 2018-06-17 DIAGNOSIS — R29898 Other symptoms and signs involving the musculoskeletal system: Secondary | ICD-10-CM | POA: Diagnosis not present

## 2018-06-17 DIAGNOSIS — M6281 Muscle weakness (generalized): Secondary | ICD-10-CM | POA: Diagnosis not present

## 2018-06-17 DIAGNOSIS — M5441 Lumbago with sciatica, right side: Secondary | ICD-10-CM | POA: Diagnosis not present

## 2018-06-17 DIAGNOSIS — M62830 Muscle spasm of back: Secondary | ICD-10-CM | POA: Diagnosis not present

## 2018-06-17 DIAGNOSIS — M545 Low back pain, unspecified: Secondary | ICD-10-CM

## 2018-06-17 DIAGNOSIS — R262 Difficulty in walking, not elsewhere classified: Secondary | ICD-10-CM | POA: Diagnosis not present

## 2018-06-17 DIAGNOSIS — G8929 Other chronic pain: Secondary | ICD-10-CM | POA: Insufficient documentation

## 2018-06-17 DIAGNOSIS — M25612 Stiffness of left shoulder, not elsewhere classified: Secondary | ICD-10-CM | POA: Diagnosis not present

## 2018-06-17 DIAGNOSIS — R293 Abnormal posture: Secondary | ICD-10-CM
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
## 2018-06-19 ENCOUNTER — Ambulatory Visit: Payer: Medicare Other | Admitting: Physical Therapy

## 2018-06-19 ENCOUNTER — Encounter: Payer: Self-pay | Admitting: Physical Therapy

## 2018-06-19 DIAGNOSIS — M545 Low back pain, unspecified: Secondary | ICD-10-CM

## 2018-06-19 DIAGNOSIS — M62830 Muscle spasm of back: Secondary | ICD-10-CM

## 2018-06-19 DIAGNOSIS — M25612 Stiffness of left shoulder, not elsewhere classified: Secondary | ICD-10-CM

## 2018-06-19 DIAGNOSIS — G8929 Other chronic pain: Secondary | ICD-10-CM | POA: Diagnosis not present

## 2018-06-19 DIAGNOSIS — M6281 Muscle weakness (generalized): Secondary | ICD-10-CM | POA: Diagnosis not present

## 2018-06-19 DIAGNOSIS — M5386 Other specified dorsopathies, lumbar region: Secondary | ICD-10-CM | POA: Diagnosis not present

## 2018-06-19 DIAGNOSIS — R293 Abnormal posture: Secondary | ICD-10-CM

## 2018-06-19 DIAGNOSIS — R29898 Other symptoms and signs involving the musculoskeletal system: Secondary | ICD-10-CM

## 2018-06-19 DIAGNOSIS — R262 Difficulty in walking, not elsewhere classified: Secondary | ICD-10-CM | POA: Diagnosis not present

## 2018-06-19 DIAGNOSIS — M5441 Lumbago with sciatica, right side: Secondary | ICD-10-CM | POA: Diagnosis not present
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
## 2018-06-19 NOTE — Patient Instructions (Addendum)
Hip Flexor Stretch    Lying on back near edge of bed, bend one leg, foot flat. Hang other leg over edge, relaxed, thigh resting entirely on bed for _1___ minutes. Repeat ____ times. Do __1__ sessions per day.3 Advanced Exercise: Bend knee back keeping thigh in contact with bed.  http://gt2.exer.us/346   Copyright  VHI. All rights reserved.  Shoulder Retraction / External Rotation With Band    With band held in front, keep elbows at side while rotating hands apart and pulling shoulder blades back. Hold __1__ seconds. Repeat 10___ times. Do _1-2___ sessions per day.  Copyright  VHI. All rights reserved.

## 2018-06-23 DIAGNOSIS — M9902 Segmental and somatic dysfunction of thoracic region: Secondary | ICD-10-CM | POA: Diagnosis not present

## 2018-06-23 DIAGNOSIS — M546 Pain in thoracic spine: Secondary | ICD-10-CM | POA: Diagnosis not present

## 2018-06-23 DIAGNOSIS — M47814 Spondylosis without myelopathy or radiculopathy, thoracic region: Secondary | ICD-10-CM | POA: Diagnosis not present

## 2018-06-25 ENCOUNTER — Encounter: Payer: Self-pay | Admitting: Physical Therapy

## 2018-06-25 ENCOUNTER — Ambulatory Visit: Payer: Medicare Other | Admitting: Physical Therapy

## 2018-06-25 DIAGNOSIS — M62830 Muscle spasm of back: Secondary | ICD-10-CM

## 2018-06-25 DIAGNOSIS — R262 Difficulty in walking, not elsewhere classified: Secondary | ICD-10-CM

## 2018-06-25 DIAGNOSIS — G8929 Other chronic pain: Secondary | ICD-10-CM | POA: Diagnosis not present

## 2018-06-25 DIAGNOSIS — M6281 Muscle weakness (generalized): Secondary | ICD-10-CM

## 2018-06-25 DIAGNOSIS — R293 Abnormal posture: Secondary | ICD-10-CM

## 2018-06-25 DIAGNOSIS — M5441 Lumbago with sciatica, right side: Secondary | ICD-10-CM

## 2018-06-25 DIAGNOSIS — M545 Low back pain, unspecified: Secondary | ICD-10-CM

## 2018-06-25 DIAGNOSIS — R29898 Other symptoms and signs involving the musculoskeletal system: Secondary | ICD-10-CM | POA: Diagnosis not present

## 2018-06-25 DIAGNOSIS — M5386 Other specified dorsopathies, lumbar region: Secondary | ICD-10-CM

## 2018-06-25 DIAGNOSIS — M25612 Stiffness of left shoulder, not elsewhere classified: Secondary | ICD-10-CM
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
## 2018-07-02 ENCOUNTER — Ambulatory Visit: Payer: Medicare Other | Admitting: Physical Therapy

## 2018-07-02 ENCOUNTER — Ambulatory Visit: Payer: Medicare Other

## 2018-07-02 DIAGNOSIS — M545 Low back pain, unspecified: Secondary | ICD-10-CM

## 2018-07-02 DIAGNOSIS — R29898 Other symptoms and signs involving the musculoskeletal system: Secondary | ICD-10-CM | POA: Diagnosis not present

## 2018-07-02 DIAGNOSIS — M5441 Lumbago with sciatica, right side: Secondary | ICD-10-CM | POA: Diagnosis not present

## 2018-07-02 DIAGNOSIS — M542 Cervicalgia: Secondary | ICD-10-CM | POA: Diagnosis not present

## 2018-07-02 DIAGNOSIS — G8929 Other chronic pain: Secondary | ICD-10-CM

## 2018-07-02 DIAGNOSIS — M6281 Muscle weakness (generalized): Secondary | ICD-10-CM | POA: Diagnosis not present

## 2018-07-02 DIAGNOSIS — M62830 Muscle spasm of back: Secondary | ICD-10-CM | POA: Diagnosis not present

## 2018-07-02 DIAGNOSIS — R293 Abnormal posture: Secondary | ICD-10-CM | POA: Diagnosis not present

## 2018-07-02 DIAGNOSIS — R262 Difficulty in walking, not elsewhere classified: Secondary | ICD-10-CM | POA: Diagnosis not present

## 2018-07-02 DIAGNOSIS — M25612 Stiffness of left shoulder, not elsewhere classified: Secondary | ICD-10-CM | POA: Diagnosis not present

## 2018-07-02 DIAGNOSIS — M5386 Other specified dorsopathies, lumbar region: Secondary | ICD-10-CM | POA: Diagnosis not present

## 2018-07-03 ENCOUNTER — Ambulatory Visit: Payer: Medicare Other | Admitting: Physical Therapy

## 2018-07-03 ENCOUNTER — Encounter: Payer: Self-pay | Admitting: Physical Therapy
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
## 2018-07-07 ENCOUNTER — Ambulatory Visit: Payer: Medicare Other | Admitting: Physical Therapy

## 2018-07-07 ENCOUNTER — Encounter: Payer: Self-pay | Admitting: Physical Therapy

## 2018-07-07 DIAGNOSIS — M25612 Stiffness of left shoulder, not elsewhere classified: Secondary | ICD-10-CM

## 2018-07-07 DIAGNOSIS — M545 Low back pain: Secondary | ICD-10-CM

## 2018-07-07 DIAGNOSIS — G8929 Other chronic pain: Secondary | ICD-10-CM | POA: Diagnosis not present

## 2018-07-07 DIAGNOSIS — M6281 Muscle weakness (generalized): Secondary | ICD-10-CM | POA: Diagnosis not present

## 2018-07-07 DIAGNOSIS — M62830 Muscle spasm of back: Secondary | ICD-10-CM | POA: Diagnosis not present

## 2018-07-07 DIAGNOSIS — R293 Abnormal posture: Secondary | ICD-10-CM

## 2018-07-07 DIAGNOSIS — R29898 Other symptoms and signs involving the musculoskeletal system: Secondary | ICD-10-CM | POA: Diagnosis not present

## 2018-07-07 DIAGNOSIS — M5441 Lumbago with sciatica, right side: Secondary | ICD-10-CM | POA: Diagnosis not present

## 2018-07-07 DIAGNOSIS — M5386 Other specified dorsopathies, lumbar region: Secondary | ICD-10-CM | POA: Diagnosis not present

## 2018-07-07 DIAGNOSIS — R262 Difficulty in walking, not elsewhere classified: Secondary | ICD-10-CM | POA: Diagnosis not present
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
## 2018-07-07 NOTE — Patient Instructions (Signed)

## 2018-07-08 DIAGNOSIS — M546 Pain in thoracic spine: Secondary | ICD-10-CM | POA: Diagnosis not present

## 2018-07-08 DIAGNOSIS — M47814 Spondylosis without myelopathy or radiculopathy, thoracic region: Secondary | ICD-10-CM | POA: Diagnosis not present

## 2018-07-08 DIAGNOSIS — M9902 Segmental and somatic dysfunction of thoracic region: Secondary | ICD-10-CM | POA: Diagnosis not present

## 2018-07-09 ENCOUNTER — Encounter: Payer: Self-pay | Admitting: Physical Therapy

## 2018-07-09 ENCOUNTER — Encounter: Payer: Medicare Other | Admitting: Physical Therapy

## 2018-07-09 ENCOUNTER — Ambulatory Visit: Payer: Medicare Other | Admitting: Physical Therapy

## 2018-07-09 DIAGNOSIS — M5386 Other specified dorsopathies, lumbar region: Secondary | ICD-10-CM | POA: Diagnosis not present

## 2018-07-09 DIAGNOSIS — M62830 Muscle spasm of back: Secondary | ICD-10-CM | POA: Diagnosis not present

## 2018-07-09 DIAGNOSIS — M25612 Stiffness of left shoulder, not elsewhere classified: Secondary | ICD-10-CM

## 2018-07-09 DIAGNOSIS — R29898 Other symptoms and signs involving the musculoskeletal system: Secondary | ICD-10-CM | POA: Diagnosis not present

## 2018-07-09 DIAGNOSIS — M545 Low back pain: Secondary | ICD-10-CM | POA: Diagnosis not present

## 2018-07-09 DIAGNOSIS — M6281 Muscle weakness (generalized): Secondary | ICD-10-CM | POA: Diagnosis not present

## 2018-07-09 DIAGNOSIS — M5441 Lumbago with sciatica, right side: Secondary | ICD-10-CM | POA: Diagnosis not present

## 2018-07-09 DIAGNOSIS — R293 Abnormal posture: Secondary | ICD-10-CM | POA: Diagnosis not present

## 2018-07-09 DIAGNOSIS — G8929 Other chronic pain: Secondary | ICD-10-CM | POA: Diagnosis not present

## 2018-07-09 DIAGNOSIS — R262 Difficulty in walking, not elsewhere classified: Secondary | ICD-10-CM | POA: Diagnosis not present
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
## 2018-07-12 ENCOUNTER — Other Ambulatory Visit: Payer: Self-pay | Admitting: Internal Medicine

## 2018-07-14 ENCOUNTER — Telehealth: Payer: Self-pay | Admitting: Physical Therapy

## 2018-07-14 ENCOUNTER — Ambulatory Visit: Payer: Medicare Other | Attending: Orthopedic Surgery | Admitting: Physical Therapy

## 2018-07-14 ENCOUNTER — Encounter: Payer: Medicare Other | Admitting: Physical Therapy

## 2018-07-14 DIAGNOSIS — R29898 Other symptoms and signs involving the musculoskeletal system: Secondary | ICD-10-CM | POA: Insufficient documentation

## 2018-07-14 DIAGNOSIS — M62830 Muscle spasm of back: Secondary | ICD-10-CM | POA: Insufficient documentation

## 2018-07-14 DIAGNOSIS — R293 Abnormal posture: Secondary | ICD-10-CM | POA: Insufficient documentation

## 2018-07-14 DIAGNOSIS — M5441 Lumbago with sciatica, right side: Secondary | ICD-10-CM | POA: Insufficient documentation

## 2018-07-14 DIAGNOSIS — M25612 Stiffness of left shoulder, not elsewhere classified: Secondary | ICD-10-CM | POA: Insufficient documentation

## 2018-07-14 DIAGNOSIS — M545 Low back pain: Secondary | ICD-10-CM | POA: Insufficient documentation

## 2018-07-14 DIAGNOSIS — G8929 Other chronic pain: Secondary | ICD-10-CM | POA: Insufficient documentation

## 2018-07-14 NOTE — Telephone Encounter (Signed)
LVM regarding missed appointment today and that he has another visit scheduled on Wednesday the 7th at 8am. If he is unable to make this appointment that he should call and cancel or reschedule. I understand there can be issues with transportation and that he has been coming to his appointments but today does make his third missed appointment which is grounds for discharge from PT. However, we are trying to work with him, but if he misses one more visit we will be forced to discharge him from physical therapy.

## 2018-07-16 ENCOUNTER — Encounter: Payer: Self-pay | Admitting: Physical Therapy

## 2018-07-16 ENCOUNTER — Ambulatory Visit: Payer: Medicare Other | Admitting: Physical Therapy

## 2018-07-16 DIAGNOSIS — M62830 Muscle spasm of back: Secondary | ICD-10-CM

## 2018-07-16 DIAGNOSIS — M545 Low back pain, unspecified: Secondary | ICD-10-CM

## 2018-07-16 DIAGNOSIS — M25612 Stiffness of left shoulder, not elsewhere classified: Secondary | ICD-10-CM

## 2018-07-16 DIAGNOSIS — G8929 Other chronic pain: Secondary | ICD-10-CM

## 2018-07-16 DIAGNOSIS — M5441 Lumbago with sciatica, right side: Secondary | ICD-10-CM | POA: Diagnosis not present

## 2018-07-16 DIAGNOSIS — R29898 Other symptoms and signs involving the musculoskeletal system: Secondary | ICD-10-CM | POA: Diagnosis not present

## 2018-07-16 DIAGNOSIS — R293 Abnormal posture: Secondary | ICD-10-CM
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
## 2018-07-21 ENCOUNTER — Ambulatory Visit: Payer: Medicare Other | Admitting: Physical Therapy

## 2018-07-21 ENCOUNTER — Encounter: Payer: Self-pay | Admitting: Physical Therapy

## 2018-07-21 ENCOUNTER — Other Ambulatory Visit: Payer: Self-pay | Admitting: *Deleted

## 2018-07-21 DIAGNOSIS — R29898 Other symptoms and signs involving the musculoskeletal system: Secondary | ICD-10-CM | POA: Diagnosis not present

## 2018-07-21 DIAGNOSIS — G8929 Other chronic pain: Secondary | ICD-10-CM | POA: Diagnosis not present

## 2018-07-21 DIAGNOSIS — M62830 Muscle spasm of back: Secondary | ICD-10-CM | POA: Diagnosis not present

## 2018-07-21 DIAGNOSIS — M5441 Lumbago with sciatica, right side: Secondary | ICD-10-CM | POA: Diagnosis not present

## 2018-07-21 DIAGNOSIS — M545 Low back pain, unspecified: Secondary | ICD-10-CM

## 2018-07-21 DIAGNOSIS — M25612 Stiffness of left shoulder, not elsewhere classified: Secondary | ICD-10-CM

## 2018-07-21 DIAGNOSIS — R293 Abnormal posture: Secondary | ICD-10-CM | POA: Diagnosis not present

## 2018-07-21 MED ORDER — AMLODIPINE BESYLATE 10 MG PO TABS: 10.0000 mg | ORAL_TABLET | Freq: Every day | ORAL | 3 refills | Status: DC
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
## 2018-07-21 NOTE — Patient Instructions (Signed)

## 2018-07-21 NOTE — Telephone Encounter (Signed)
Pt has been out of meds x 2 days. Fleeger, Brian Dominguez, CMA

## 2018-07-24 DIAGNOSIS — M1711 Unilateral primary osteoarthritis, right knee: Secondary | ICD-10-CM | POA: Diagnosis not present

## 2018-07-28 ENCOUNTER — Encounter: Payer: Self-pay | Admitting: Physical Therapy

## 2018-07-28 ENCOUNTER — Ambulatory Visit: Payer: Medicare Other | Admitting: Physical Therapy

## 2018-07-28 DIAGNOSIS — R29898 Other symptoms and signs involving the musculoskeletal system: Secondary | ICD-10-CM

## 2018-07-28 DIAGNOSIS — M62830 Muscle spasm of back: Secondary | ICD-10-CM | POA: Diagnosis not present

## 2018-07-28 DIAGNOSIS — M545 Low back pain, unspecified: Secondary | ICD-10-CM

## 2018-07-28 DIAGNOSIS — R293 Abnormal posture: Secondary | ICD-10-CM | POA: Diagnosis not present

## 2018-07-28 DIAGNOSIS — G8929 Other chronic pain: Secondary | ICD-10-CM | POA: Diagnosis not present

## 2018-07-28 DIAGNOSIS — M5441 Lumbago with sciatica, right side: Secondary | ICD-10-CM | POA: Diagnosis not present

## 2018-07-28 DIAGNOSIS — M25612 Stiffness of left shoulder, not elsewhere classified: Secondary | ICD-10-CM | POA: Diagnosis not present
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
## 2018-07-30 ENCOUNTER — Encounter: Payer: Medicare Other | Admitting: Physical Therapy

## 2018-07-31 ENCOUNTER — Encounter: Payer: Self-pay | Admitting: Physical Therapy

## 2018-07-31 ENCOUNTER — Ambulatory Visit: Payer: Medicare Other | Admitting: Physical Therapy

## 2018-07-31 DIAGNOSIS — R29898 Other symptoms and signs involving the musculoskeletal system: Secondary | ICD-10-CM | POA: Diagnosis not present

## 2018-07-31 DIAGNOSIS — M62830 Muscle spasm of back: Secondary | ICD-10-CM

## 2018-07-31 DIAGNOSIS — M5441 Lumbago with sciatica, right side: Secondary | ICD-10-CM | POA: Diagnosis not present

## 2018-07-31 DIAGNOSIS — G8929 Other chronic pain: Secondary | ICD-10-CM

## 2018-07-31 DIAGNOSIS — R293 Abnormal posture: Secondary | ICD-10-CM | POA: Diagnosis not present

## 2018-07-31 DIAGNOSIS — M25612 Stiffness of left shoulder, not elsewhere classified: Secondary | ICD-10-CM

## 2018-07-31 DIAGNOSIS — M545 Low back pain, unspecified: Secondary | ICD-10-CM

## 2018-07-31 NOTE — Patient Instructions (Signed)
Posture Awareness    Stand and check posture: Jut chin, pull back to comfortable position. Tilt pelvis forward, back; be sure back is not swayed. Roll from heels to balls of feet, then distribute your weight evenly. Picture a line through spine pulling you erect. Focus on breathing. Good Posture = Better Breathing. Check __several_ times per day.  http://gt2.exer.us/873   Copyright  VHI. All rights reserved.
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
## 2018-08-02 DIAGNOSIS — M542 Cervicalgia: Secondary | ICD-10-CM | POA: Diagnosis not present

## 2018-08-04 ENCOUNTER — Other Ambulatory Visit: Payer: Self-pay | Admitting: Student in an Organized Health Care Education/Training Program

## 2018-08-04 ENCOUNTER — Telehealth: Payer: Self-pay | Admitting: *Deleted

## 2018-08-04 NOTE — Telephone Encounter (Signed)
-----   Message from Howard PouchLauren Feng, MD sent at 08/04/2018  8:33 AM EDT ----- Regarding: Surgical clearance  Received surgical clearance request from [facility] for right total knee replacement. At last office visit in 03/2018 he had uncontrolled HTN and T2DM, was asked to follow up in one month but we have not seen him since then. Please have patient come in for a visit so we can assess surgical risk.  Howard PouchLauren Feng, MD PGY-3 [facility]

## 2018-08-04 NOTE — Telephone Encounter (Signed)
Attempted to call patient, no answer and VM was full.  Will await callback.  Dr Mosetta PuttFeng, do you want to create and mail a letter?  Fleeger, Maryjo RochesterJessica Dawn, CMA

## 2018-08-07 ENCOUNTER — Encounter: Payer: Self-pay | Admitting: Student in an Organized Health Care Education/Training Program

## 2018-08-07 NOTE — Telephone Encounter (Signed)
Sent a letter. Thank you

## 2018-08-08 ENCOUNTER — Ambulatory Visit: Payer: Medicare Other | Admitting: Physical Therapy

## 2018-08-08 NOTE — Telephone Encounter (Signed)
Letter created and routed to Admin to send. Jannis Atkins, Maryjo RochesterJessica Dawn, CMA

## 2018-08-12 ENCOUNTER — Encounter (HOSPITAL_COMMUNITY): Payer: Self-pay | Admitting: *Deleted

## 2018-08-12 ENCOUNTER — Emergency Department (HOSPITAL_COMMUNITY)
Admission: EM | Admit: 2018-08-12 | Discharge: 2018-08-12 | Disposition: A | Discharge status: Home / Self Care | Payer: Medicare Other | Attending: Emergency Medicine | Admitting: Emergency Medicine

## 2018-08-12 DIAGNOSIS — R112 Nausea with vomiting, unspecified: Secondary | ICD-10-CM | POA: Diagnosis not present

## 2018-08-12 DIAGNOSIS — R42 Dizziness and giddiness: Secondary | ICD-10-CM | POA: Diagnosis not present

## 2018-08-12 DIAGNOSIS — Z7982 Long term (current) use of aspirin: Secondary | ICD-10-CM | POA: Insufficient documentation

## 2018-08-12 DIAGNOSIS — Z7984 Long term (current) use of oral hypoglycemic drugs: Secondary | ICD-10-CM | POA: Diagnosis not present

## 2018-08-12 DIAGNOSIS — I10 Essential (primary) hypertension: Secondary | ICD-10-CM | POA: Diagnosis not present

## 2018-08-12 DIAGNOSIS — E119 Type 2 diabetes mellitus without complications: Secondary | ICD-10-CM | POA: Diagnosis not present

## 2018-08-12 DIAGNOSIS — Z79899 Other long term (current) drug therapy: Secondary | ICD-10-CM | POA: Diagnosis not present

## 2018-08-12 DIAGNOSIS — R531 Weakness: Secondary | ICD-10-CM | POA: Diagnosis not present

## 2018-08-12 LAB — CBC
HEMATOCRIT: 43.6 % (ref 39.0–52.0)
Hemoglobin: 14.6 g/dL (ref 13.0–17.0)
MCH: 27.5 pg (ref 26.0–34.0)
MCHC: 33.5 g/dL (ref 30.0–36.0)
MCV: 82.1 fL (ref 78.0–100.0)
Platelets: 273 10*3/uL (ref 150–400)
RBC: 5.31 MIL/uL (ref 4.22–5.81)
RDW: 13 % (ref 11.5–15.5)
WBC: 6.6 10*3/uL (ref 4.0–10.5)

## 2018-08-12 LAB — COMPREHENSIVE METABOLIC PANEL
ALBUMIN: 4 g/dL (ref 3.5–5.0)
ALK PHOS: 69 U/L (ref 38–126)
ALT: 81 U/L — AB (ref 0–44)
AST: 113 U/L — AB (ref 15–41)
Anion gap: 10 (ref 5–15)
BILIRUBIN TOTAL: 1 mg/dL (ref 0.3–1.2)
BUN: 9 mg/dL (ref 8–23)
CALCIUM: 9.3 mg/dL (ref 8.9–10.3)
CO2: 26 mmol/L (ref 22–32)
Chloride: 102 mmol/L (ref 98–111)
Creatinine, Ser: 0.98 mg/dL (ref 0.61–1.24)
GFR calc Af Amer: >60 mL/min (ref >60)
GLUCOSE: 192 mg/dL — AB (ref 70–99)
Potassium: 3.6 mmol/L (ref 3.5–5.1)
Sodium: 138 mmol/L (ref 135–145)
TOTAL PROTEIN: 7.3 g/dL (ref 6.5–8.1)

## 2018-08-12 LAB — URINALYSIS, ROUTINE W REFLEX MICROSCOPIC
Bilirubin Urine: NEGATIVE
Glucose, UA: 150 mg/dL — AB
Hgb urine dipstick: NEGATIVE
Ketones, ur: NEGATIVE mg/dL
LEUKOCYTES UA: NEGATIVE
NITRITE: NEGATIVE
PH: 6 (ref 5.0–8.0)
Protein, ur: NEGATIVE mg/dL
SPECIFIC GRAVITY, URINE: 1.026 (ref 1.005–1.030)

## 2018-08-12 LAB — LIPASE, BLOOD: Lipase: 73 U/L — ABNORMAL HIGH (ref 11–51)

## 2018-08-12 MED ORDER — SODIUM CHLORIDE 0.9 % IV BOLUS
500.0000 mL | Freq: Once | INTRAVENOUS | Status: AC
  Administered 2018-08-12: 500 mL via INTRAVENOUS

## 2018-08-12 MED ORDER — ONDANSETRON HCL 4 MG/2ML IJ SOLN
4.0000 mg | Freq: Once | INTRAMUSCULAR | Status: AC
  Administered 2018-08-12: 4 mg via INTRAVENOUS
  Filled 2018-08-12: qty 2

## 2018-08-12 MED ORDER — MECLIZINE HCL 12.5 MG PO TABS: 12.5000 mg | ORAL_TABLET | Freq: Three times a day (TID) | ORAL | 0 refills | Status: DC | PRN

## 2018-08-12 MED ORDER — ONDANSETRON HCL 4 MG PO TABS: 4.0000 mg | ORAL_TABLET | Freq: Four times a day (QID) | ORAL | 0 refills | Status: DC

## 2018-08-12 MED ORDER — MECLIZINE HCL 25 MG PO TABS
25.0000 mg | ORAL_TABLET | Freq: Once | ORAL | Status: AC
  Administered 2018-08-12: 25 mg via ORAL
  Filled 2018-08-12: qty 1
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
## 2018-08-12 NOTE — Discharge Instructions (Signed)
Return for any problem.  Follow-up with your regular doctors as instructed. °

## 2018-08-12 NOTE — ED Triage Notes (Signed)
Pt in c/o vomiting for the last week, constant nausea, c/o increased weakness and dizziness at times, also right ear congestion, denies pain at this time, no distress noted

## 2018-09-02 DIAGNOSIS — M542 Cervicalgia: Secondary | ICD-10-CM | POA: Diagnosis not present

## 2018-09-04 NOTE — Telephone Encounter (Signed)
Brian Dominguez with [facility] called nurse line requesting an update on surgery clearance form.   LVM for Brian Dominguez informing her he needs to be seen for an apt. We have reached out several times and sent him a letter.

## 2018-09-10 ENCOUNTER — Other Ambulatory Visit: Payer: Self-pay

## 2018-09-10 ENCOUNTER — Encounter: Payer: Self-pay | Admitting: Student in an Organized Health Care Education/Training Program

## 2018-09-10 ENCOUNTER — Ambulatory Visit (INDEPENDENT_AMBULATORY_CARE_PROVIDER_SITE_OTHER): Payer: Medicare Other | Admitting: Student in an Organized Health Care Education/Training Program

## 2018-09-10 VITALS — BP 132/68 | HR 107 | Temp 98.1°F | Ht 73.0 in | Wt 236.2 lb

## 2018-09-10 DIAGNOSIS — Z716 Tobacco abuse counseling: Secondary | ICD-10-CM

## 2018-09-10 DIAGNOSIS — E119 Type 2 diabetes mellitus without complications: Secondary | ICD-10-CM

## 2018-09-10 DIAGNOSIS — Z01810 Encounter for preprocedural cardiovascular examination: Secondary | ICD-10-CM | POA: Diagnosis not present

## 2018-09-10 DIAGNOSIS — Z01818 Encounter for other preprocedural examination: Secondary | ICD-10-CM | POA: Insufficient documentation

## 2018-09-10 DIAGNOSIS — J302 Other seasonal allergic rhinitis: Secondary | ICD-10-CM

## 2018-09-10 LAB — POCT GLYCOSYLATED HEMOGLOBIN (HGB A1C): HBA1C, POC (CONTROLLED DIABETIC RANGE): 7.4 % — AB (ref 0.0–7.0)

## 2018-09-10 MED ORDER — CETIRIZINE HCL 10 MG PO TABS: 10.0000 mg | ORAL_TABLET | Freq: Every day | ORAL | 11 refills | Status: DC

## 2018-09-10 NOTE — Progress Notes (Signed)
CC: preoperative clearance  HPI: Brian Dominguez is a 72 y.o. male .  Preoperative clearance Patient presents for preoperative clearance.  He is undergoing orthopedic surgery for knee replacement on 10/18.  Recently seen in the emergency department for vomiting and at that time labs and EKG were completed.  Additionally he is a diabetic and takes 1000 mg of metformin twice daily.  He is due for an A1c.  Otherwise, he is denying chest pain, dyspnea, or dyspnea on exertion.  He reports that he completes all of his ADLs on his own.  He manages his own medications.  He is able to walk without assistance and is generally very functional.  And is a smoker, he smokes 1 pack of cigarettes every 2 to 3 days.  He reports that he plans to quit 2 weeks prior to surgery as instructed by his surgeon.  Seasonal allergies -patient endorses 1 month of itchy eyes, dry eyes, scratchy throat, and feeling that his ears are clogged.  He uses Flonase once daily.  He does not feel that the Flonase is helping.  He has not had any fevers or ear pain.  He has no sick contacts that he knows of, and reports that he lives alone.  He is not taking anything by mouth for allergies.   T2DM Patient endorses 100% compliance in taking his metformin 1000 mg 2 times daily.  He is not endorsing polyuria, polyphagia or polydipsia.  His last A1c was in 03/2018 and was elevated at 8.1.   Note that patient expressed concern about sore muscles in his back as he is about to leave his visit.  I let him know that we had already addressed multiple complaints in his office visit and he would have to return to discuss back pain.  He was dissatisfied with this as he left the office.  Review of Symptoms:  See HPI for ROS.   CC, SH/smoking status, and VS noted.  Objective: BP 132/68   Pulse (!) 107   Temp 98.1 F (36.7 C) (Oral)   Ht 6\' 1"  (1.854 m)   Wt 236 lb 3.2 oz (107.1 kg)   SpO2 98%   BMI 31.16 kg/m  GEN: NAD, alert,  cooperative EYE: no conjunctival injection, pupils equally round and reactive to light ENMT: normal tympanic light reflex, no nasal polyps,no rhinorrhea, no pharyngeal erythema or exudates NECK: full ROM, no thyromegaly RESPIRATORY: clear to auscultation bilaterally with no wheezes, rhonchi or rales, good effort CV: RRR, no m/r/g, no peripheral edema GI: soft, non-tender, non-distended, no hepatosplenomegaly SKIN: warm and dry, no rashes or lesions NEURO: II-XII grossly intact, normal gait PSYCH: AAOx3, appropriate affect  Assessment and plan:  DM type 2 (diabetes mellitus, type 2) Improvement in A1c.  No red flag symptoms.  Continue current regimen.  Goal A1c is less than 7.  Seasonal allergies No concern for infection at this time. Symptoms most consistent with seasonal allergies Continue flonase Add daily zyrtec   Tobacco abuse counseling Patient plans on quitting smoking this week in anticipation of his upcoming orthopedic procedure.  Preoperative clearance Patient recently underwent EKG and blood chemistry when seen in the emergency department.  These were reviewed.  RCRI score of zero indicating a 3.9% 30 day risk of death, MI, or cardiac arrest.  Class I risk/considered low risk for knee replacement surgery.    Orders Placed This Encounter  Procedures  . HgB A1c    Meds ordered this encounter  Medications  .  cetirizine (ZYRTEC) 10 MG tablet    Sig: Take 1 tablet (10 mg total) by mouth daily.    Dispense:  30 tablet    Refill:  11     Howard Pouch, MD,MS,  PGY3 09/10/2018 3:06 PM

## 2018-09-10 NOTE — Assessment & Plan Note (Addendum)
Improvement in A1c.  No red flag symptoms.  Continue current regimen.  Goal A1c is less than 7.

## 2018-09-10 NOTE — Assessment & Plan Note (Signed)
Patient recently underwent EKG and blood chemistry when seen in the emergency department.  These were reviewed.  RCRI score of zero indicating a 3.9% 30 day risk of death, MI, or cardiac arrest.  Class I risk/considered low risk for knee replacement surgery.

## 2018-09-10 NOTE — Assessment & Plan Note (Signed)
Patient plans on quitting smoking this week in anticipation of his upcoming orthopedic procedure.

## 2018-09-10 NOTE — Assessment & Plan Note (Addendum)
No concern for infection at this time. Symptoms most consistent with seasonal allergies Continue flonase Add daily zyrtec

## 2018-09-10 NOTE — Patient Instructions (Signed)
It was a pleasure seeing you today in our clinic.   I will send a letter to your surgeon stating that you are considered low risk for surgery.  Please continue to use flonase for your allergies. Additionally we will add zyrtec to take daily.  Our clinic's number is [phone]. Please call with questions or concerns about what we discussed today.  Be well, Dr. Mosetta Putt

## 2018-09-15 ENCOUNTER — Other Ambulatory Visit: Payer: Self-pay | Admitting: Orthopedic Surgery

## 2018-09-22 NOTE — Progress Notes (Signed)
LOV Dr Mosetta Putt 09-10-18 epic  Hgba1c 09-10-18 epic    EKG 08-12-18 epic   CXR 06-14-18 epic

## 2018-09-22 NOTE — Patient Instructions (Signed)
Brian Dominguez  09/22/2018   Your procedure is scheduled on: 09-29-18   Report to [facility] Main  Entrance    Report to admitting at 10:45AM    Call this number if you have problems the morning of surgery [phone]    Remember: Do not eat food After Midnight. YOU MAY HAVE CLEAR LIQUIDS FROM MIDNIGHT UNTIL 7:15AM. NOTHING BY MOUTH AFTER 7:15AM! BRUSH YOUR TEETH MORNING OF SURGERY AND RINSE YOUR MOUTH OUT, NO CHEWING GUM CANDY OR MINTS.     CLEAR LIQUID DIET   Foods Allowed                                                                     Foods Excluded  Coffee and tea, regular and decaf                             liquids that you cannot  Plain Jell-O in any flavor                                             see through such as: Fruit ices (not with fruit pulp)                                     milk, soups, orange juice  Iced Popsicles                                    All solid food Carbonated beverages, regular and diet                                    Cranberry, grape and apple juices Sports drinks like Gatorade Lightly seasoned clear broth or consume(fat free) Sugar, honey syrup  Sample Menu Breakfast                                Lunch                                     Supper Cranberry juice                    Beef broth                            Chicken broth Jell-O                                     Grape juice  Apple juice Coffee or tea                        Jell-O                                      Popsicle                                                Coffee or tea                        Coffee or tea  _____________________________________________________________________       Take these medicines the morning of surgery with A SIP OF WATER: AMLODIPINE, ZYRTEC, FLONASE OMEPRAZOLE   DO NOT TAKE ANY DIABETIC MEDICATIONS DAY OF YOUR SURGERY                               You may not have any  metal on your body including hair pins and              piercings  Do not wear jewelry, make-up, lotions, powders or perfumes, deodorant              Men may shave face and neck.   Do not bring valuables to the hospital. [facility] IS NOT             RESPONSIBLE   FOR VALUABLES.  Contacts, dentures or bridgework may not be worn into surgery.  Leave suitcase in the car. After surgery it may be brought to your room.                 Please read over the following fact sheets you were given: _____________________________________________________________________             [facility] - Preparing for Surgery Before surgery, you can play an important role.  Because skin is not sterile, your skin needs to be as free of germs as possible.  You can reduce the number of germs on your skin by washing with CHG (chlorahexidine gluconate) soap before surgery.  CHG is an antiseptic cleaner which kills germs and bonds with the skin to continue killing germs even after washing. Please DO NOT use if you have an allergy to CHG or antibacterial soaps.  If your skin becomes reddened/irritated stop using the CHG and inform your nurse when you arrive at Short Stay. Do not shave (including legs and underarms) for at least 48 hours prior to the first CHG shower.  You may shave your face/neck. Please follow these instructions carefully:  1.  Shower with CHG Soap the night before surgery and the  morning of Surgery.  2.  If you choose to wash your hair, wash your hair first as usual with your  normal  shampoo.  3.  After you shampoo, rinse your hair and body thoroughly to remove the  shampoo.                           4.  Use CHG as you would any other liquid soap.  You can apply chg directly  to the skin  and wash                       Gently with a scrungie or clean washcloth.  5.  Apply the CHG Soap to your body ONLY FROM THE NECK DOWN.   Do not use on face/ open                           Wound or open sores.  Avoid contact with eyes, ears mouth and genitals (private parts).                       Wash face,  Genitals (private parts) with your normal soap.             6.  Wash thoroughly, paying special attention to the area where your surgery  will be performed.  7.  Thoroughly rinse your body with warm water from the neck down.  8.  DO NOT shower/wash with your normal soap after using and rinsing off  the CHG Soap.                9.  Pat yourself dry with a clean towel.            10.  Wear clean pajamas.            11.  Place clean sheets on your bed the night of your first shower and do not  sleep with pets. Day of Surgery : Do not apply any lotions/deodorants the morning of surgery.  Please wear clean clothes to the hospital/surgery center.  FAILURE TO FOLLOW THESE INSTRUCTIONS MAY RESULT IN THE CANCELLATION OF YOUR SURGERY PATIENT SIGNATURE_________________________________  NURSE SIGNATURE__________________________________  ________________________________________________________________________   Rogelia Mire  An incentive spirometer is a tool that can help keep your lungs clear and active. This tool measures how well you are filling your lungs with each breath. Taking long deep breaths may help reverse or decrease the chance of developing breathing (pulmonary) problems (especially infection) following:  A long period of time when you are unable to move or be active. BEFORE THE PROCEDURE   If the spirometer includes an indicator to show your best effort, your nurse or respiratory therapist will set it to a desired goal.  If possible, sit up straight or lean slightly forward. Try not to slouch.  Hold the incentive spirometer in an upright position. INSTRUCTIONS FOR USE  1. Sit on the edge of your bed if possible, or sit up as far as you can in bed or on a chair. 2. Hold the incentive spirometer in an upright position. 3. Breathe out normally. 4. Place the mouthpiece in your  mouth and seal your lips tightly around it. 5. Breathe in slowly and as deeply as possible, raising the piston or the ball toward the top of the column. 6. Hold your breath for 3-5 seconds or for as long as possible. Allow the piston or ball to fall to the bottom of the column. 7. Remove the mouthpiece from your mouth and breathe out normally. 8. Rest for a few seconds and repeat Steps 1 through 7 at least 10 times every 1-2 hours when you are awake. Take your time and take a few normal breaths between deep breaths. 9. The spirometer may include an indicator to show your best effort. Use the indicator as a goal to work toward during each repetition. 10. After each set of  10 deep breaths, practice coughing to be sure your lungs are clear. If you have an incision (the cut made at the time of surgery), support your incision when coughing by placing a pillow or rolled up towels firmly against it. Once you are able to get out of bed, walk around indoors and cough well. You may stop using the incentive spirometer when instructed by your caregiver.  RISKS AND COMPLICATIONS  Take your time so you do not get dizzy or light-headed.  If you are in pain, you may need to take or ask for pain medication before doing incentive spirometry. It is harder to take a deep breath if you are having pain. AFTER USE  Rest and breathe slowly and easily.  It can be helpful to keep track of a log of your progress. Your caregiver can provide you with a simple table to help with this. If you are using the spirometer at home, follow these instructions: SEEK MEDICAL CARE IF:   You are having difficultly using the spirometer.  You have trouble using the spirometer as often as instructed.  Your pain medication is not giving enough relief while using the spirometer.  You develop fever of 100.5 F (38.1 C) or higher. SEEK IMMEDIATE MEDICAL CARE IF:   You cough up bloody sputum that had not been present before.  You  develop fever of 102 F (38.9 C) or greater.  You develop worsening pain at or near the incision site. MAKE SURE YOU:   Understand these instructions.  Will watch your condition.  Will get help right away if you are not doing well or get worse. Document Released: 04/08/2007 Document Revised: 02/18/2012 Document Reviewed: 06/09/2007 [facility] Patient Information 2014 [place], [place].   ________________________________________________________________________

## 2018-09-23 ENCOUNTER — Other Ambulatory Visit: Payer: Self-pay

## 2018-09-23 ENCOUNTER — Encounter (HOSPITAL_COMMUNITY)
Admission: RE | Admit: 2018-09-23 | Discharge: 2018-09-23 | Disposition: A | Discharge status: Home / Self Care | Payer: Medicare Other | Source: Ambulatory Visit | Attending: Orthopedic Surgery | Admitting: Orthopedic Surgery

## 2018-09-23 ENCOUNTER — Other Ambulatory Visit: Payer: Self-pay | Admitting: Orthopedic Surgery

## 2018-09-23 ENCOUNTER — Encounter (HOSPITAL_COMMUNITY): Payer: Self-pay

## 2018-09-23 DIAGNOSIS — Z79899 Other long term (current) drug therapy: Secondary | ICD-10-CM | POA: Diagnosis not present

## 2018-09-23 DIAGNOSIS — Z7982 Long term (current) use of aspirin: Secondary | ICD-10-CM | POA: Insufficient documentation

## 2018-09-23 DIAGNOSIS — I10 Essential (primary) hypertension: Secondary | ICD-10-CM | POA: Diagnosis not present

## 2018-09-23 DIAGNOSIS — Z01812 Encounter for preprocedural laboratory examination: Secondary | ICD-10-CM | POA: Insufficient documentation

## 2018-09-23 DIAGNOSIS — F17210 Nicotine dependence, cigarettes, uncomplicated: Secondary | ICD-10-CM | POA: Diagnosis not present

## 2018-09-23 DIAGNOSIS — E669 Obesity, unspecified: Secondary | ICD-10-CM | POA: Diagnosis not present

## 2018-09-23 DIAGNOSIS — E119 Type 2 diabetes mellitus without complications: Secondary | ICD-10-CM | POA: Insufficient documentation

## 2018-09-23 DIAGNOSIS — Z6830 Body mass index (BMI) 30.0-30.9, adult: Secondary | ICD-10-CM | POA: Diagnosis not present

## 2018-09-23 DIAGNOSIS — M1711 Unilateral primary osteoarthritis, right knee: Secondary | ICD-10-CM | POA: Diagnosis not present

## 2018-09-23 DIAGNOSIS — E785 Hyperlipidemia, unspecified: Secondary | ICD-10-CM | POA: Insufficient documentation

## 2018-09-23 LAB — CBC WITH DIFFERENTIAL/PLATELET
ABS IMMATURE GRANULOCYTES: 0.02 10*3/uL (ref 0.00–0.07)
Basophils Absolute: 0 10*3/uL (ref 0.0–0.1)
Basophils Relative: 1 %
EOS PCT: 5 %
Eosinophils Absolute: 0.3 10*3/uL (ref 0.0–0.5)
HEMATOCRIT: 44.3 % (ref 39.0–52.0)
HEMOGLOBIN: 14.7 g/dL (ref 13.0–17.0)
Immature Granulocytes: 0 %
LYMPHS ABS: 1.9 10*3/uL (ref 0.7–4.0)
LYMPHS PCT: 36 %
MCH: 27.3 pg (ref 26.0–34.0)
MCHC: 33.2 g/dL (ref 30.0–36.0)
MCV: 82.3 fL (ref 80.0–100.0)
MONO ABS: 0.5 10*3/uL (ref 0.1–1.0)
MONOS PCT: 9 %
NEUTROS ABS: 2.6 10*3/uL (ref 1.7–7.7)
NRBC: 0 % (ref 0.0–0.2)
Neutrophils Relative %: 49 %
Platelets: 301 10*3/uL (ref 150–400)
RBC: 5.38 MIL/uL (ref 4.22–5.81)
RDW: 14.5 % (ref 11.5–15.5)
WBC: 5.4 10*3/uL (ref 4.0–10.5)

## 2018-09-23 LAB — COOXEMETRY PANEL
Carboxyhemoglobin: 2.5 % — ABNORMAL HIGH (ref 0.5–1.5)
METHEMOGLOBIN: 0.8 % (ref 0.0–1.5)
O2 Saturation: 50.7 %
Total hemoglobin: 15.6 g/dL (ref 12.0–16.0)

## 2018-09-23 LAB — BASIC METABOLIC PANEL
Anion gap: 10 (ref 5–15)
BUN: 9 mg/dL (ref 8–23)
CALCIUM: 10.1 mg/dL (ref 8.9–10.3)
CHLORIDE: 99 mmol/L (ref 98–111)
CO2: 30 mmol/L (ref 22–32)
CREATININE: 0.88 mg/dL (ref 0.61–1.24)
Glucose, Bld: 175 mg/dL — ABNORMAL HIGH (ref 70–99)
Potassium: 4.5 mmol/L (ref 3.5–5.1)
SODIUM: 139 mmol/L (ref 135–145)

## 2018-09-23 LAB — URINALYSIS, ROUTINE W REFLEX MICROSCOPIC
BILIRUBIN URINE: NEGATIVE
GLUCOSE, UA: 150 mg/dL — AB
Hgb urine dipstick: NEGATIVE
KETONES UR: NEGATIVE mg/dL
LEUKOCYTES UA: NEGATIVE
NITRITE: NEGATIVE
PH: 8 (ref 5.0–8.0)
PROTEIN: NEGATIVE mg/dL
Specific Gravity, Urine: 1.013 (ref 1.005–1.030)

## 2018-09-23 LAB — GLUCOSE, CAPILLARY: Glucose-Capillary: 163 mg/dL — ABNORMAL HIGH (ref 70–99)

## 2018-09-23 LAB — PROTIME-INR
INR: 0.91
PROTHROMBIN TIME: 12.2 s (ref 11.4–15.2)

## 2018-09-23 LAB — APTT: APTT: 27 s (ref 24–36)

## 2018-09-23 LAB — ABO/RH: ABO/RH(D): B POS

## 2018-09-23 LAB — SURGICAL PCR SCREEN
MRSA, PCR: NEGATIVE
STAPHYLOCOCCUS AUREUS: NEGATIVE

## 2018-09-23 NOTE — Progress Notes (Signed)
COOXEYMETRY PANEL ROUTED VIA EPIC TO DR Turner Daniels

## 2018-09-25 DIAGNOSIS — M1711 Unilateral primary osteoarthritis, right knee: Secondary | ICD-10-CM | POA: Diagnosis present
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
## 2018-09-28 MED ORDER — TRANEXAMIC ACID 1000 MG/10ML IV SOLN
2000.0000 mg | INTRAVENOUS | Status: DC
  Filled 2018-09-28: qty 20

## 2018-09-28 MED ORDER — BUPIVACAINE LIPOSOME 1.3 % IJ SUSP
20.0000 mL | INTRAMUSCULAR | Status: DC
  Filled 2018-09-28: qty 20

## 2018-09-29 ENCOUNTER — Encounter (HOSPITAL_COMMUNITY)
Admission: RE | Disposition: A | Discharge status: Home / Self Care | Payer: Self-pay | Source: Ambulatory Visit | Attending: Orthopedic Surgery

## 2018-09-29 ENCOUNTER — Encounter (HOSPITAL_COMMUNITY): Payer: Self-pay | Admitting: *Deleted

## 2018-09-29 ENCOUNTER — Ambulatory Visit (HOSPITAL_COMMUNITY)
Admission: RE | Admit: 2018-09-29 | Discharge: 2018-10-01 | Disposition: A | Discharge status: Home / Self Care | Payer: Medicare Other | Source: Ambulatory Visit | Attending: Orthopedic Surgery | Admitting: Orthopedic Surgery

## 2018-09-29 ENCOUNTER — Ambulatory Visit (HOSPITAL_COMMUNITY): Payer: Medicare Other | Admitting: Certified Registered"

## 2018-09-29 ENCOUNTER — Ambulatory Visit (HOSPITAL_COMMUNITY): Payer: Medicare Other

## 2018-09-29 ENCOUNTER — Other Ambulatory Visit: Payer: Self-pay

## 2018-09-29 DIAGNOSIS — F17210 Nicotine dependence, cigarettes, uncomplicated: Secondary | ICD-10-CM | POA: Diagnosis not present

## 2018-09-29 DIAGNOSIS — E119 Type 2 diabetes mellitus without complications: Secondary | ICD-10-CM | POA: Diagnosis not present

## 2018-09-29 DIAGNOSIS — K219 Gastro-esophageal reflux disease without esophagitis: Secondary | ICD-10-CM | POA: Diagnosis not present

## 2018-09-29 DIAGNOSIS — M1711 Unilateral primary osteoarthritis, right knee: Secondary | ICD-10-CM | POA: Diagnosis not present

## 2018-09-29 DIAGNOSIS — Z01818 Encounter for other preprocedural examination: Secondary | ICD-10-CM

## 2018-09-29 DIAGNOSIS — Z79899 Other long term (current) drug therapy: Secondary | ICD-10-CM | POA: Insufficient documentation

## 2018-09-29 DIAGNOSIS — Z7984 Long term (current) use of oral hypoglycemic drugs: Secondary | ICD-10-CM | POA: Diagnosis not present

## 2018-09-29 DIAGNOSIS — Z7982 Long term (current) use of aspirin: Secondary | ICD-10-CM | POA: Diagnosis not present

## 2018-09-29 DIAGNOSIS — I10 Essential (primary) hypertension: Secondary | ICD-10-CM | POA: Insufficient documentation

## 2018-09-29 DIAGNOSIS — N400 Enlarged prostate without lower urinary tract symptoms: Secondary | ICD-10-CM | POA: Diagnosis not present

## 2018-09-29 DIAGNOSIS — S83141A Lateral subluxation of proximal end of tibia, right knee, initial encounter: Secondary | ICD-10-CM | POA: Diagnosis not present

## 2018-09-29 DIAGNOSIS — G8918 Other acute postprocedural pain: Secondary | ICD-10-CM | POA: Diagnosis not present

## 2018-09-29 HISTORY — PX: TOTAL KNEE ARTHROPLASTY: SHX125

## 2018-09-29 LAB — GLUCOSE, CAPILLARY
GLUCOSE-CAPILLARY: 178 mg/dL — AB (ref 70–99)
GLUCOSE-CAPILLARY: 267 mg/dL — AB (ref 70–99)
GLUCOSE-CAPILLARY: 211 mg/dL — AB (ref 70–99)
Glucose-Capillary: 168 mg/dL — ABNORMAL HIGH (ref 70–99)

## 2018-09-29 LAB — TYPE AND SCREEN
ABO/RH(D): B POS
ANTIBODY SCREEN: NEGATIVE

## 2018-09-29 SURGERY — ARTHROPLASTY, KNEE, TOTAL
Anesthesia: Spinal | Site: Knee | Laterality: Right

## 2018-09-29 MED ORDER — PANTOPRAZOLE SODIUM 40 MG PO TBEC
80.0000 mg | DELAYED_RELEASE_TABLET | Freq: Every day | ORAL | Status: DC
  Administered 2018-09-30 – 2018-10-01 (×2): 80 mg via ORAL
  Filled 2018-09-29 (×3): qty 2

## 2018-09-29 MED ORDER — TRANEXAMIC ACID-NACL 1000-0.7 MG/100ML-% IV SOLN
1000.0000 mg | Freq: Once | INTRAVENOUS | Status: AC
  Administered 2018-09-29: 1000 mg via INTRAVENOUS
  Filled 2018-09-29: qty 100

## 2018-09-29 MED ORDER — ONDANSETRON HCL 4 MG/2ML IJ SOLN
INTRAMUSCULAR | Status: DC | PRN
  Administered 2018-09-29: 4 mg via INTRAVENOUS

## 2018-09-29 MED ORDER — PROPOFOL 10 MG/ML IV BOLUS
INTRAVENOUS | Status: DC | PRN
  Administered 2018-09-29 (×8): 20 mg via INTRAVENOUS

## 2018-09-29 MED ORDER — LORATADINE 10 MG PO TABS
10.0000 mg | ORAL_TABLET | Freq: Every day | ORAL | Status: DC
  Administered 2018-09-30 – 2018-10-01 (×2): 10 mg via ORAL
  Filled 2018-09-29 (×2): qty 1

## 2018-09-29 MED ORDER — MIDAZOLAM HCL 2 MG/2ML IJ SOLN
INTRAMUSCULAR | Status: DC | PRN
  Administered 2018-09-29 (×2): 1 mg via INTRAVENOUS

## 2018-09-29 MED ORDER — LIDOCAINE 2% (20 MG/ML) 5 ML SYRINGE
INTRAMUSCULAR | Status: DC | PRN
  Administered 2018-09-29: 60 mg via INTRAVENOUS

## 2018-09-29 MED ORDER — PROMETHAZINE HCL 25 MG/ML IJ SOLN: 6.2500 mg | INTRAMUSCULAR | Status: DC | PRN

## 2018-09-29 MED ORDER — FENTANYL CITRATE (PF) 100 MCG/2ML IJ SOLN
INTRAMUSCULAR | Status: DC | PRN
  Administered 2018-09-29: 25 ug via INTRAVENOUS
  Administered 2018-09-29: 50 ug via INTRAVENOUS
  Administered 2018-09-29: 25 ug via INTRAVENOUS

## 2018-09-29 MED ORDER — KCL IN DEXTROSE-NACL 20-5-0.45 MEQ/L-%-% IV SOLN
INTRAVENOUS | Status: DC
  Administered 2018-09-29 – 2018-09-30 (×2): via INTRAVENOUS
  Filled 2018-09-29 (×2): qty 1000

## 2018-09-29 MED ORDER — PROPOFOL 10 MG/ML IV BOLUS
INTRAVENOUS | Status: AC
  Filled 2018-09-29: qty 60

## 2018-09-29 MED ORDER — BUPIVACAINE HCL (PF) 0.25 % IJ SOLN
INTRAMUSCULAR | Status: AC
  Filled 2018-09-29: qty 30

## 2018-09-29 MED ORDER — HYDROMORPHONE HCL 1 MG/ML IJ SOLN: 0.5000 mg | INTRAMUSCULAR | Status: DC | PRN

## 2018-09-29 MED ORDER — ASPIRIN EC 81 MG PO TBEC: 81.0000 mg | DELAYED_RELEASE_TABLET | Freq: Two times a day (BID) | ORAL | 0 refills | Status: DC

## 2018-09-29 MED ORDER — PHENOL 1.4 % MT LIQD
1.0000 | OROMUCOSAL | Status: DC | PRN
  Filled 2018-09-29: qty 177

## 2018-09-29 MED ORDER — FLUTICASONE PROPIONATE 50 MCG/ACT NA SUSP
2.0000 | Freq: Every day | NASAL | Status: DC
  Administered 2018-10-01: 2 via NASAL
  Filled 2018-09-29: qty 16

## 2018-09-29 MED ORDER — METOCLOPRAMIDE HCL 5 MG PO TABS: 5.0000 mg | ORAL_TABLET | Freq: Three times a day (TID) | ORAL | Status: DC | PRN

## 2018-09-29 MED ORDER — GABAPENTIN 300 MG PO CAPS
300.0000 mg | ORAL_CAPSULE | Freq: Three times a day (TID) | ORAL | Status: DC
  Administered 2018-09-29 – 2018-10-01 (×6): 300 mg via ORAL
  Filled 2018-09-29 (×6): qty 1

## 2018-09-29 MED ORDER — BUPIVACAINE IN DEXTROSE 0.75-8.25 % IT SOLN
INTRATHECAL | Status: DC | PRN
  Administered 2018-09-29: 1.8 mL via INTRATHECAL

## 2018-09-29 MED ORDER — CELECOXIB 200 MG PO CAPS
200.0000 mg | ORAL_CAPSULE | Freq: Two times a day (BID) | ORAL | Status: DC
  Administered 2018-09-29 – 2018-10-01 (×4): 200 mg via ORAL
  Filled 2018-09-29 (×4): qty 1

## 2018-09-29 MED ORDER — ONDANSETRON HCL 4 MG PO TABS: 4.0000 mg | ORAL_TABLET | Freq: Four times a day (QID) | ORAL | Status: DC | PRN

## 2018-09-29 MED ORDER — INSULIN ASPART 100 UNIT/ML ~~LOC~~ SOLN
0.0000 [IU] | Freq: Three times a day (TID) | SUBCUTANEOUS | Status: DC
  Administered 2018-09-29: 8 [IU] via SUBCUTANEOUS
  Administered 2018-09-30: 5 [IU] via SUBCUTANEOUS
  Administered 2018-09-30: 8 [IU] via SUBCUTANEOUS
  Administered 2018-09-30: 5 [IU] via SUBCUTANEOUS
  Administered 2018-10-01: 3 [IU] via SUBCUTANEOUS

## 2018-09-29 MED ORDER — SODIUM CHLORIDE 0.9 % IV SOLN
INTRAVENOUS | Status: DC | PRN
  Administered 2018-09-29: 25 ug/min via INTRAVENOUS

## 2018-09-29 MED ORDER — FLEET ENEMA 7-19 GM/118ML RE ENEM: 1.0000 | ENEMA | Freq: Once | RECTAL | Status: DC | PRN

## 2018-09-29 MED ORDER — METFORMIN HCL 500 MG PO TABS
1000.0000 mg | ORAL_TABLET | Freq: Two times a day (BID) | ORAL | Status: DC
  Administered 2018-09-29 – 2018-10-01 (×4): 1000 mg via ORAL
  Filled 2018-09-29 (×4): qty 2

## 2018-09-29 MED ORDER — OXYCODONE-ACETAMINOPHEN 5-325 MG PO TABS: 1.0000 | ORAL_TABLET | ORAL | 0 refills | Status: DC | PRN

## 2018-09-29 MED ORDER — DOCUSATE SODIUM 100 MG PO CAPS
100.0000 mg | ORAL_CAPSULE | Freq: Two times a day (BID) | ORAL | Status: DC
  Administered 2018-09-29 – 2018-10-01 (×4): 100 mg via ORAL
  Filled 2018-09-29 (×4): qty 1

## 2018-09-29 MED ORDER — BISACODYL 5 MG PO TBEC: 5.0000 mg | DELAYED_RELEASE_TABLET | Freq: Every day | ORAL | Status: DC | PRN

## 2018-09-29 MED ORDER — WATER FOR IRRIGATION, STERILE IR SOLN
Status: DC | PRN
  Administered 2018-09-29: 2000 mL

## 2018-09-29 MED ORDER — ONDANSETRON HCL 4 MG/2ML IJ SOLN: 4.0000 mg | Freq: Four times a day (QID) | INTRAMUSCULAR | Status: DC | PRN

## 2018-09-29 MED ORDER — METOCLOPRAMIDE HCL 5 MG/ML IJ SOLN: 5.0000 mg | Freq: Three times a day (TID) | INTRAMUSCULAR | Status: DC | PRN

## 2018-09-29 MED ORDER — CEFAZOLIN SODIUM-DEXTROSE 2-4 GM/100ML-% IV SOLN
2.0000 g | INTRAVENOUS | Status: AC
  Administered 2018-09-29: 2 g via INTRAVENOUS
  Filled 2018-09-29: qty 100

## 2018-09-29 MED ORDER — MEPERIDINE HCL 50 MG/ML IJ SOLN: 6.2500 mg | INTRAMUSCULAR | Status: DC | PRN

## 2018-09-29 MED ORDER — OXYCODONE HCL 5 MG PO TABS
5.0000 mg | ORAL_TABLET | ORAL | Status: DC | PRN
  Administered 2018-09-29 – 2018-10-01 (×7): 5 mg via ORAL
  Filled 2018-09-29 (×7): qty 1

## 2018-09-29 MED ORDER — CHLORHEXIDINE GLUCONATE 4 % EX LIQD: 60.0000 mL | Freq: Once | CUTANEOUS | Status: DC

## 2018-09-29 MED ORDER — MIDAZOLAM HCL 2 MG/2ML IJ SOLN
1.0000 mg | Freq: Once | INTRAMUSCULAR | Status: DC
  Filled 2018-09-29: qty 2

## 2018-09-29 MED ORDER — METHOCARBAMOL 1000 MG/10ML IJ SOLN
500.0000 mg | Freq: Four times a day (QID) | INTRAVENOUS | Status: DC | PRN
  Filled 2018-09-29: qty 5

## 2018-09-29 MED ORDER — AMLODIPINE BESYLATE 10 MG PO TABS
10.0000 mg | ORAL_TABLET | Freq: Every day | ORAL | Status: DC
  Administered 2018-09-30 – 2018-10-01 (×2): 10 mg via ORAL
  Filled 2018-09-29 (×2): qty 1

## 2018-09-29 MED ORDER — ROPIVACAINE HCL 5 MG/ML IJ SOLN
INTRAMUSCULAR | Status: DC | PRN
  Administered 2018-09-29: 30 mL via PERINEURAL

## 2018-09-29 MED ORDER — FENTANYL CITRATE (PF) 100 MCG/2ML IJ SOLN
50.0000 ug | Freq: Once | INTRAMUSCULAR | Status: AC
  Administered 2018-09-29 (×2): 50 ug via INTRAVENOUS
  Filled 2018-09-29: qty 2

## 2018-09-29 MED ORDER — TRANEXAMIC ACID-NACL 1000-0.7 MG/100ML-% IV SOLN
1000.0000 mg | INTRAVENOUS | Status: AC
  Administered 2018-09-29: 1000 mg via INTRAVENOUS
  Filled 2018-09-29: qty 100

## 2018-09-29 MED ORDER — ACETAMINOPHEN 325 MG PO TABS: 325.0000 mg | ORAL_TABLET | Freq: Four times a day (QID) | ORAL | Status: DC | PRN

## 2018-09-29 MED ORDER — PANTOPRAZOLE SODIUM 40 MG PO TBEC
40.0000 mg | DELAYED_RELEASE_TABLET | Freq: Every day | ORAL | Status: DC
  Administered 2018-09-29: 40 mg via ORAL
  Filled 2018-09-29: qty 1

## 2018-09-29 MED ORDER — LACTATED RINGERS IV SOLN
INTRAVENOUS | Status: DC
  Administered 2018-09-29 (×3): via INTRAVENOUS

## 2018-09-29 MED ORDER — POLYETHYLENE GLYCOL 3350 17 G PO PACK
17.0000 g | PACK | Freq: Every day | ORAL | Status: DC | PRN
  Administered 2018-10-01: 17 g via ORAL
  Filled 2018-09-29: qty 1

## 2018-09-29 MED ORDER — DEXAMETHASONE SODIUM PHOSPHATE 10 MG/ML IJ SOLN
INTRAMUSCULAR | Status: DC | PRN
  Administered 2018-09-29: 4 mg via INTRAVENOUS

## 2018-09-29 MED ORDER — SODIUM CHLORIDE 0.9 % IJ SOLN
INTRAMUSCULAR | Status: DC | PRN
  Administered 2018-09-29: 50 mL

## 2018-09-29 MED ORDER — DIPHENHYDRAMINE HCL 12.5 MG/5ML PO ELIX: 12.5000 mg | ORAL_SOLUTION | ORAL | Status: DC | PRN

## 2018-09-29 MED ORDER — BUPIVACAINE HCL (PF) 0.25 % IJ SOLN
INTRAMUSCULAR | Status: DC | PRN
  Administered 2018-09-29: 30 mL

## 2018-09-29 MED ORDER — ALUM & MAG HYDROXIDE-SIMETH 200-200-20 MG/5ML PO SUSP
30.0000 mL | ORAL | Status: DC | PRN
  Administered 2018-10-01: 30 mL via ORAL
  Filled 2018-09-29: qty 30

## 2018-09-29 MED ORDER — HYDROMORPHONE HCL 1 MG/ML IJ SOLN: 0.2500 mg | INTRAMUSCULAR | Status: DC | PRN

## 2018-09-29 MED ORDER — TRANEXAMIC ACID 1000 MG/10ML IV SOLN
INTRAVENOUS | Status: DC | PRN
  Administered 2018-09-29: 2000 mg via TOPICAL

## 2018-09-29 MED ORDER — METHOCARBAMOL 500 MG PO TABS: 500.0000 mg | ORAL_TABLET | Freq: Four times a day (QID) | ORAL | Status: DC | PRN

## 2018-09-29 MED ORDER — SODIUM CHLORIDE 0.9 % IJ SOLN
INTRAMUSCULAR | Status: AC
  Filled 2018-09-29: qty 50

## 2018-09-29 MED ORDER — HYDROCODONE-ACETAMINOPHEN 7.5-325 MG PO TABS: 1.0000 | ORAL_TABLET | Freq: Once | ORAL | Status: DC | PRN

## 2018-09-29 MED ORDER — MIDAZOLAM HCL 2 MG/2ML IJ SOLN
INTRAMUSCULAR | Status: AC
  Filled 2018-09-29: qty 2

## 2018-09-29 MED ORDER — FENTANYL CITRATE (PF) 100 MCG/2ML IJ SOLN
INTRAMUSCULAR | Status: AC
  Filled 2018-09-29: qty 2

## 2018-09-29 MED ORDER — BUPIVACAINE LIPOSOME 1.3 % IJ SUSP: 20.0000 mL | Freq: Once | INTRAMUSCULAR | Status: DC

## 2018-09-29 MED ORDER — MENTHOL 3 MG MT LOZG: 1.0000 | LOZENGE | OROMUCOSAL | Status: DC | PRN

## 2018-09-29 MED ORDER — ACETAMINOPHEN 10 MG/ML IV SOLN: 1000.0000 mg | Freq: Once | INTRAVENOUS | Status: DC | PRN

## 2018-09-29 MED ORDER — BUPIVACAINE LIPOSOME 1.3 % IJ SUSP
INTRAMUSCULAR | Status: DC | PRN
  Administered 2018-09-29: 20 mL

## 2018-09-29 MED ORDER — SODIUM CHLORIDE 0.9 % IR SOLN
Status: DC | PRN
  Administered 2018-09-29: 1000 mL

## 2018-09-29 MED ORDER — ASPIRIN 81 MG PO CHEW
81.0000 mg | CHEWABLE_TABLET | Freq: Two times a day (BID) | ORAL | Status: DC
  Administered 2018-09-29 – 2018-10-01 (×4): 81 mg via ORAL
  Filled 2018-09-29 (×4): qty 1

## 2018-09-29 MED ORDER — TIZANIDINE HCL 2 MG PO TABS: 2.0000 mg | ORAL_TABLET | Freq: Four times a day (QID) | ORAL | 0 refills | Status: DC | PRN

## 2018-09-29 MED ORDER — PROPOFOL 500 MG/50ML IV EMUL
INTRAVENOUS | Status: DC | PRN
  Administered 2018-09-29: 75 ug/kg/min via INTRAVENOUS

## 2018-09-29 MED ORDER — TRANEXAMIC ACID-NACL 1000-0.7 MG/100ML-% IV SOLN: 1000.0000 mg | INTRAVENOUS | Status: DC

## 2018-09-29 SURGICAL SUPPLY — 56 items
ATTUNE MED DOME PAT 41 KNEE (Knees) ×1 IMPLANT
ATTUNE PS FEM RT SZ 7 CEM KNEE (Femur) ×1 IMPLANT
ATTUNE PSRP INSR SZ7 6 KNEE (Insert) ×1 IMPLANT
BAG DECANTER FOR FLEXI CONT (MISCELLANEOUS) ×2 IMPLANT
BAG SPEC THK2 15X12 ZIP CLS (MISCELLANEOUS) ×1
BAG ZIPLOCK 12X15 (MISCELLANEOUS) ×2 IMPLANT
BANDAGE ELASTIC 6 VELCRO ST LF (GAUZE/BANDAGES/DRESSINGS) ×1 IMPLANT
BASE TIBIAL ATTUNE KNEE SZ9 (Knees) IMPLANT
BLADE SAG 18X100X1.27 (BLADE) ×2 IMPLANT
BLADE SAW SGTL 11.0X1.19X90.0M (BLADE) IMPLANT
BNDG CMPR MED 10X6 ELC LF (GAUZE/BANDAGES/DRESSINGS) ×1
BNDG ELASTIC 6X10 VLCR STRL LF (GAUZE/BANDAGES/DRESSINGS) ×2 IMPLANT
BOWL SMART MIX CTS (DISPOSABLE) ×2 IMPLANT
BSPLAT TIB 9 CMNT ROT PLAT STR (Knees) ×1 IMPLANT
CEMENT HV SMART SET (Cement) ×3 IMPLANT
COVER SURGICAL LIGHT HANDLE (MISCELLANEOUS) ×2 IMPLANT
COVER WAND RF STERILE (DRAPES) IMPLANT
CUFF TOURN SGL QUICK 34 (TOURNIQUET CUFF) ×2
CUFF TRNQT CYL 34X4X40X1 (TOURNIQUET CUFF) ×1 IMPLANT
DECANTER SPIKE VIAL GLASS SM (MISCELLANEOUS) ×6 IMPLANT
DRAPE U-SHAPE 47X51 STRL (DRAPES) ×2 IMPLANT
DRESSING AQUACEL AG SP 3.5X10 (GAUZE/BANDAGES/DRESSINGS) IMPLANT
DRSG AQUACEL AG ADV 3.5X10 (GAUZE/BANDAGES/DRESSINGS) ×2 IMPLANT
DRSG AQUACEL AG SP 3.5X10 (GAUZE/BANDAGES/DRESSINGS) ×2
DURAPREP 26ML APPLICATOR (WOUND CARE) ×2 IMPLANT
ELECT REM PT RETURN 15FT ADLT (MISCELLANEOUS) ×2 IMPLANT
GLOVE BIO SURGEON STRL SZ7.5 (GLOVE) ×2 IMPLANT
GLOVE BIO SURGEON STRL SZ8.5 (GLOVE) ×2 IMPLANT
GLOVE BIOGEL PI IND STRL 8 (GLOVE) ×1 IMPLANT
GLOVE BIOGEL PI IND STRL 9 (GLOVE) ×1 IMPLANT
GLOVE BIOGEL PI INDICATOR 8 (GLOVE) ×1
GLOVE BIOGEL PI INDICATOR 9 (GLOVE) ×1
GOWN STRL REUS W/TWL XL LVL3 (GOWN DISPOSABLE) ×4 IMPLANT
HANDPIECE INTERPULSE COAX TIP (DISPOSABLE) ×2
HOOD PEEL AWAY FLYTE STAYCOOL (MISCELLANEOUS) ×6 IMPLANT
IV KIT MINILOC 20X1 SAFETY (NEEDLE) ×2 IMPLANT
NS IRRIG 1000ML POUR BTL (IV SOLUTION) ×2 IMPLANT
PACK ICE MAXI GEL EZY WRAP (MISCELLANEOUS) ×2 IMPLANT
PACK TOTAL KNEE CUSTOM (KITS) ×2 IMPLANT
PIN STEINMAN FIXATION KNEE (PIN) ×1 IMPLANT
PIN THREADED HEADED SIGMA (PIN) ×1 IMPLANT
POSITIONER SURGICAL ARM (MISCELLANEOUS) ×2 IMPLANT
SET HNDPC FAN SPRY TIP SCT (DISPOSABLE) ×1 IMPLANT
STAPLER VISISTAT 35W (STAPLE) IMPLANT
SUT VIC AB 1 CTX 36 (SUTURE) ×2
SUT VIC AB 1 CTX36XBRD ANBCTR (SUTURE) ×1 IMPLANT
SUT VIC AB 2-0 CT1 27 (SUTURE) ×2
SUT VIC AB 2-0 CT1 TAPERPNT 27 (SUTURE) ×1 IMPLANT
SUT VIC AB 3-0 CT1 27 (SUTURE) ×2
SUT VIC AB 3-0 CT1 TAPERPNT 27 (SUTURE) ×1 IMPLANT
SYR CONTROL 10ML LL (SYRINGE) ×4 IMPLANT
TIBIAL BASE ATTUNE KNEE SZ9 (Knees) ×2 IMPLANT
TIBIAL BASE ROT PLAT SZ 9 KNEE (Miscellaneous) ×1 IMPLANT
TRAY FOLEY MTR SLVR 16FR STAT (SET/KITS/TRAYS/PACK) ×2 IMPLANT
WATER STERILE IRR 1000ML POUR (IV SOLUTION) ×4 IMPLANT
YANKAUER SUCT BULB TIP 10FT TU (MISCELLANEOUS) ×2 IMPLANT

## 2018-09-29 NOTE — Op Note (Signed)
PATIENT ID:      Brian Dominguez  MRN:     [id number] DOB/AGE:    04-25-1946 / 72 y.o.       OPERATIVE REPORT    DATE OF PROCEDURE:  09/29/2018       PREOPERATIVE DIAGNOSIS:   RIGHT KNEE OSTEOARTHRITIS      Estimated body mass index is 30.33 kg/m as calculated from the following:   Height as of this encounter: 6\' 2"  (1.88 m).   Weight as of this encounter: 107.1 kg.                                                        POSTOPERATIVE DIAGNOSIS:   right knee oseoarthritis                                                                      PROCEDURE:  Procedure(s): RIGHT TOTAL KNEE ARTHROPLASTY Using DepuyAttune RP implants #7R Femur, #9Tibia, 6 mm Attune RP bearing, 41 Patella     SURGEON: [name]    ASSISTANT:   Tomi Likens. [employer]   (Present and scrubbed throughout the case, critical for assistance with exposure, retraction, instrumentation, and closure.)         ANESTHESIA: Spinal, 20cc Exparel, 50cc 0.25% Marcaine  EBL: 300cc  FLUID REPLACEMENT: 1500cc  Tourniquet Time: None  Drains: None  Tranexamic Acid: 1gm IV, 2gm topical  Exparel: 266mg    COMPLICATIONS:  None         INDICATIONS FOR PROCEDURE: The patient has  RIGHT KNEE OSTEOARTHRITIS, Var deformities, XR shows bone on bone arthritis, lateral subluxation of tibia. Patient has failed all conservative measures including anti-inflammatory medicines, narcotics, attempts at  exercise and weight loss, cortisone injections and viscosupplementation.  Risks and benefits of surgery have been discussed, questions answered.   DESCRIPTION OF PROCEDURE: The patient identified by armband, received  IV antibiotics, in the holding area at [facility]. Patient taken to the operating room, appropriate anesthetic  monitors were attached, and Spinal anesthesia was  induced. Tourniquet  applied high to the operative thigh. Lateral post and foot positioner  applied to the table, the lower extremity was then prepped and draped   in usual sterile fashion from the toes to the tourniquet. Time-out procedure was performed. The skin and subcutaneous tissue along the incision was injected with 20 cc of a mixture of Exparel and Marcaine solution, using a 20-gauge by 1-1/2 inch needle. We began the operation, with the knee flexed 130 degrees, by making the anterior midline incision starting at handbreadth above the patella going over the patella 1 cm medial to and 4 cm distal to the tibial tubercle. Small bleeders in the skin and the  subcutaneous tissue identified and cauterized. Transverse retinaculum was incised and reflected medially and a medial parapatellar arthrotomy was accomplished. the patella was everted and theprepatellar fat pad resected. The superficial medial collateral  ligament was then elevated from anterior to posterior along the proximal  flare of the tibia and anterior half of the menisci resected. The knee was hyperflexed  exposing bone on bone arthritis. Peripheral and notch osteophytes as well as the cruciate ligaments were then resected. We continued to  work our way around posteriorly along the proximal tibia, and externally  rotated the tibia subluxing it out from underneath the femur. A McHale  retractor was placed through the notch and a lateral Hohmann retractor  placed, and we then drilled through the proximal tibia in line with the  axis of the tibia followed by an intramedullary guide rod and 2-degree  posterior slope cutting guide. The tibial cutting guide, 3 degree posterior sloped, was pinned into place allowing resection of 2 mm of bone medially and 9 mm of bone laterally. Satisfied with the tibial resection, we then  entered the distal femur 2 mm anterior to the PCL origin with the  intramedullary guide rod and applied the distal femoral cutting guide  set at 9 mm, with 5 degrees of valgus. This was pinned along the  epicondylar axis. At this point, the distal femoral cut was accomplished without  difficulty. We then sized for a #7R femoral component and pinned the guide in 3 degrees of external rotation. The chamfer cutting guide was pinned into place. The anterior, posterior, and chamfer cuts were accomplished without difficulty followed by  the Attune RP box cutting guide and the box cut. We also removed posterior osteophytes from the posterior femoral condyles. At this  time, the knee was brought into full extension. We checked our  extension and flexion gaps and found them symmetric for a 6 mm bearing. Distracting in extension with a lamina spreader, the posterior horns of the menisci were removed, and Exparel, diluted to 60 cc, with 20cc NS, and 20cc 0.5% Marcaine,was injected into the capsule and synovium of the knee. The posterior patella cut was accomplished with the 9.5 mm Attune cutting guide, sized for a 41mm dome, and the fixation pegs drilled.The knee  was then once again hyperflexed exposing the proximal tibia. We sized for a # 9 tibial base plate, applied the smokestack and the conical reamer followed by the the Delta fin keel punch. We then hammered into place the Attune RP trial femoral component, drilled the lugs, inserted a  41 mm trial bearing, trial patellar button, and took the knee through range of motion from 0-130 degrees. No thumb pressure was required for patellar Tracking. At this point, the limb was wrapped with an Esmarch bandage and the tourniquet inflated to 350 mmHg. All trial components were removed, mating surfaces irrigated with pulse lavage, and dried with suction and sponges. 10 cc of the Exparel solution was applied to the cancellus bone of the patella distal femur and proximal tibia.  After waiting 1 minute, the bony surfaces were again, dried with sponges. A double batch of DePuy HV cement with 1500 mg of Zinacef was mixed and applied to all bony metallic mating surfaces except for the posterior condyles of the femur itself. In order, we hammered into place the  tibial tray and removed excess cement, the femoral component and removed excess cement. The final Attune RP bearing  was inserted, and the knee brought to full extension with compression.  The patellar button was clamped into place, and excess cement  removed. While the cement cured the wound was irrigated out with normal saline solution pulse lavage. Ligament stability and patellar tracking were checked and found to be excellent. The parapatellar arthrotomy was closed with  running #1 Vicryl suture. The subcutaneous tissue with 0 and 2-0 undyed  Vicryl suture, and the skin with running 3-0 SQ vicryl. A dressing of Xeroform,  4 x 4, dressing sponges, Webril, and Ace wrap applied. The patient  awakened, and taken to recovery room without difficulty.   [name] 09/29/2018, 2:35 PM

## 2018-09-29 NOTE — Evaluation (Signed)
Physical Therapy Evaluation Patient Details Name: Brian Dominguez MRN: [id number] DOB: 1946/10/06 Today's Date: 09/29/2018   History of Present Illness  72 YO male s/p R TKR on 09/29/18. PMH includes radiculopathy, HLD, DMII, obesity, depression, HTN, hiatial hernia. Surgical history includes cervical fusion 2013, lumbar fusion 2016, L total shoulder.   Clinical Impression  Pt presents with R knee pain, difficulty performing mobility tasks, decreased R knee ROM, decreased tolerance for ambulation, and pt-corrected R knee buckling with ambulation. Pt to benefit from acute PT to address deficits. Pt ambulated room distance, and ambulation was stopped when pt exhibited R knee buckling. PT to progress mobility as tolerated, and will continue to follow acutely.      Follow Up Recommendations Follow surgeon's recommendation for DC plan and follow-up therapies(HHPT vs SNF)    Equipment Recommendations  Rolling walker with 5" wheels    Recommendations for Other Services       Precautions / Restrictions Precautions Precautions: Fall Restrictions Weight Bearing Restrictions: No Other Position/Activity Restrictions: WBAT       Mobility  Bed Mobility Overal bed mobility: Needs Assistance Bed Mobility: Supine to Sit     Supine to sit: Min assist;HOB elevated     General bed mobility comments: Min assist for RLE management. Upon sitting EOB, PT discovered pt had a BM prior to mobilization.   Transfers Overall transfer level: Needs assistance Equipment used: Rolling walker (2 wheeled) Transfers: Sit to/from Stand Sit to Stand: Min assist;From elevated surface         General transfer comment: Min assist for power up and steadying. PT performed pericare in standing while pt held onto RW for balance. Verbal cuing for hand placement.   Ambulation/Gait Ambulation/Gait assistance: Min assist;Min guard Gait Distance (Feet): 10 Feet Assistive device: Rolling walker (2 wheeled) Gait  Pattern/deviations: Step-to pattern;Decreased stride length;Decreased weight shift to right;Antalgic Gait velocity: decr    General Gait Details: Pt with min guard initially for safety, but after 4 ft ambulation, pt with R knee buckling. PT encouraged pt to go back to chair due to this, increasing assist to min assist for steadying.   Stairs            Wheelchair Mobility    Modified Rankin (Stroke Patients Only)       Balance Overall balance assessment: Mild deficits observed, not formally tested                                           Pertinent Vitals/Pain Pain Assessment: 0-10 Pain Score: 5  Pain Location: R knee  Pain Descriptors / Indicators: Sore Pain Intervention(s): Limited activity within patient's tolerance;Repositioned;Monitored during session;Premedicated before session    Home Living Family/patient expects to be discharged to:: Private residence Living Arrangements: Non-relatives/Friends Available Help at Discharge: (unsure of familial or friend support, pt not forthcoming with this information) Type of Home: House Home Access: Level entry     Home Layout: One level Home Equipment: None      Prior Function Level of Independence: Independent               Hand Dominance   Dominant Hand: Right    Extremity/Trunk Assessment   Upper Extremity Assessment Upper Extremity Assessment: Overall WFL for tasks assessed    Lower Extremity Assessment Lower Extremity Assessment: Overall WFL for tasks assessed;RLE deficits/detail RLE Deficits / Details: Suspected post-surgical weakness;  able to perform quad set, heel slide, ankle pumps  RLE Sensation: WNL    Cervical / Trunk Assessment Cervical / Trunk Assessment: Normal  Communication   Communication: No difficulties  Cognition Arousal/Alertness: Awake/alert Behavior During Therapy: Flat affect Overall Cognitive Status: Within Functional Limits for tasks assessed                                         General Comments      Exercises     Assessment/Plan    PT Assessment Patient needs continued PT services  PT Problem List Decreased strength;Pain;Decreased range of motion;Decreased activity tolerance;Decreased knowledge of use of DME;Decreased safety awareness;Decreased balance;Decreased mobility       PT Treatment Interventions DME instruction;Therapeutic activities;Gait training;Therapeutic exercise;Patient/family education;Balance training;Functional mobility training    PT Goals (Current goals can be found in the Care Plan section)  Acute Rehab PT Goals Patient Stated Goal: none stated  PT Goal Formulation: With patient Time For Goal Achievement: 10/06/18 Potential to Achieve Goals: Good    Frequency 7X/week   Barriers to discharge        Co-evaluation               AM-PAC PT "6 Clicks" Daily Activity  Outcome Measure Difficulty turning over in bed (including adjusting bedclothes, sheets and blankets)?: Unable Difficulty moving from lying on back to sitting on the side of the bed? : Unable Difficulty sitting down on and standing up from a chair with arms (e.g., wheelchair, bedside commode, etc,.)?: Unable Help needed moving to and from a bed to chair (including a wheelchair)?: A Little Help needed walking in hospital room?: A Little Help needed climbing 3-5 steps with a railing? : A Little 6 Click Score: 12    End of Session Equipment Utilized During Treatment: Gait belt Activity Tolerance: Other (comment);Patient tolerated treatment well(Pt limited by knee buckling ) Patient left: in chair;with chair alarm set;with call bell/phone within reach;with SCD's reapplied Nurse Communication: Mobility status PT Visit Diagnosis: Other abnormalities of gait and mobility (R26.89);Difficulty in walking, not elsewhere classified (R26.2)    Time: 9147-8295 PT Time Calculation (min) (ACUTE ONLY): 24 min   Charges:   PT  Evaluation $PT Eval Low Complexity: 1 Low PT Treatments $Therapeutic Activity: 8-22 mins       [name], PT Acute Rehabilitation Services Pager [phone]  Office [phone]  [name] D [name] 09/29/2018, 8:47 PM

## 2018-09-29 NOTE — Anesthesia Preprocedure Evaluation (Addendum)
Anesthesia Evaluation  Patient identified by MRN, date of birth, ID band Patient awake    Reviewed: Allergy & Precautions, NPO status , Patient's Chart, lab work & pertinent test results  Airway Mallampati: I  TM Distance: >3 FB Neck ROM: Full    Dental no notable dental hx. (+) Edentulous Upper, Edentulous Lower   Pulmonary Current Smoker,    Pulmonary exam normal breath sounds clear to auscultation       Cardiovascular hypertension, Pt. on medications negative cardio ROS Normal cardiovascular exam Rhythm:Regular Rate:Normal     Neuro/Psych Anxiety    GI/Hepatic Neg liver ROS, hiatal hernia, GERD  ,  Endo/Other  diabetes  Renal/GU      Musculoskeletal   Abdominal (+) + obese,   Peds  Hematology negative hematology ROS (+)   Anesthesia Other Findings   Reproductive/Obstetrics                             Lab Results  Component Value Date   WBC 5.4 09/23/2018   HGB 14.7 09/23/2018   HCT 44.3 09/23/2018   MCV 82.3 09/23/2018   PLT 301 09/23/2018   Lab Results  Component Value Date   CREATININE 0.88 09/23/2018   BUN 9 09/23/2018   NA 139 09/23/2018   K 4.5 09/23/2018   CL 99 09/23/2018   CO2 30 09/23/2018     Anesthesia Physical Anesthesia Plan  ASA: III  Anesthesia Plan: Spinal   Post-op Pain Management:  Regional for Post-op pain   Induction: Intravenous  PONV Risk Score and Plan: Treatment may vary due to age or medical condition, Ondansetron and Dexamethasone  Airway Management Planned: Natural Airway and Nasal Cannula  Additional Equipment:   Intra-op Plan:   Post-operative Plan:   Informed Consent: I have reviewed the patients History and Physical, chart, labs and discussed the procedure including the risks, benefits and alternatives for the proposed anesthesia with the patient or authorized representative who has indicated his/her understanding and  acceptance.   Dental advisory given  Plan Discussed with: CRNA  Anesthesia Plan Comments: (Plus adductor canal )        Anesthesia Quick Evaluation

## 2018-09-29 NOTE — Discharge Instructions (Signed)

## 2018-09-29 NOTE — Anesthesia Postprocedure Evaluation (Signed)
Anesthesia Post Note  Patient: Brian Dominguez  Procedure(s) Performed: RIGHT TOTAL KNEE ARTHROPLASTY (Right Knee)     Patient location during evaluation: PACU Anesthesia Type: Spinal Level of consciousness: oriented and awake and alert Pain management: pain level controlled Vital Signs Assessment: post-procedure vital signs reviewed and stable Respiratory status: spontaneous breathing, respiratory function stable and patient connected to nasal cannula oxygen Cardiovascular status: blood pressure returned to baseline and stable Postop Assessment: no headache, no backache and no apparent nausea or vomiting Anesthetic complications: no    Last Vitals:  Vitals:   09/29/18 1803 09/29/18 1854  BP: 132/66 135/62  Pulse: 81 82  Resp: 14 14  Temp: 36.6 C 36.7 C  SpO2: 100% 100%    Last Pain:  Vitals:   09/29/18 1854  TempSrc: Oral  PainSc:                  Stephen A Houser     

## 2018-09-29 NOTE — Anesthesia Postprocedure Evaluation (Signed)
Anesthesia Post Note  Patient: Brian Dominguez  Procedure(s) Performed: RIGHT TOTAL KNEE ARTHROPLASTY (Right Knee)     Patient location during evaluation: PACU Anesthesia Type: Spinal Level of consciousness: oriented and awake and alert Pain management: pain level controlled Vital Signs Assessment: post-procedure vital signs reviewed and stable Respiratory status: spontaneous breathing, respiratory function stable and patient connected to nasal cannula oxygen Cardiovascular status: blood pressure returned to baseline and stable Postop Assessment: no headache, no backache and no apparent nausea or vomiting Anesthetic complications: no    Last Vitals:  Vitals:   09/29/18 1803 09/29/18 1854  BP: 132/66 135/62  Pulse: 81 82  Resp: 14 14  Temp: 36.6 C 36.7 C  SpO2: 100% 100%    Last Pain:  Vitals:   09/29/18 1854  TempSrc: Oral  PainSc:                  Trevor Iha

## 2018-09-29 NOTE — Interval H&P Note (Signed)
History and Physical Interval Note:  09/29/2018 12:01 PM  Brian Dominguez  has presented today for surgery, with the diagnosis of RIGHT KNEE OSTEOARTHRITIS  The various methods of treatment have been discussed with the patient and family. After consideration of risks, benefits and other options for treatment, the patient has consented to  Procedure(s): RIGHT TOTAL KNEE ARTHROPLASTY (Right) as a surgical intervention .  The patient's history has been reviewed, patient examined, no change in status, stable for surgery.  I have reviewed the patient's chart and labs.  Questions were answered to the patient's satisfaction.     [name]

## 2018-09-29 NOTE — Addendum Note (Signed)
Addendum  created 09/29/18 1933 by Trevor Iha, MD   Sign clinical note

## 2018-09-29 NOTE — Transfer of Care (Signed)
Immediate Anesthesia Transfer of Care Note  Patient: Brian Dominguez  Procedure(s) Performed: RIGHT TOTAL KNEE ARTHROPLASTY (Right Knee)  Patient Location: PACU  Anesthesia Type:Spinal  Level of Consciousness: sedated  Airway & Oxygen Therapy: Patient Spontanous Breathing and Patient connected to face mask oxygen  Post-op Assessment: Report given to RN and Post -op Vital signs reviewed and stable  Post vital signs: Reviewed and stable  Last Vitals:  Vitals Value Taken Time  BP 110/76 09/29/2018  2:55 PM  Temp    Pulse 81 09/29/2018  2:56 PM  Resp 13 09/29/2018  2:56 PM  SpO2 100 % 09/29/2018  2:56 PM  Vitals shown include unvalidated device data.  Last Pain:  Vitals:   09/29/18 1229  TempSrc:   PainSc: 0-No pain         Complications: No apparent anesthesia complications

## 2018-09-29 NOTE — Anesthesia Procedure Notes (Signed)
Procedure Name: MAC Date/Time: 09/29/2018 12:47 PM Performed by: Cynda Familia, CRNA Pre-anesthesia Checklist: Patient identified, Emergency Drugs available, Suction available, Patient being monitored and Timeout performed Patient Re-evaluated:Patient Re-evaluated prior to induction Oxygen Delivery Method: Simple face mask Placement Confirmation: breath sounds checked- equal and bilateral and positive ETCO2 Dental Injury: Teeth and Oropharynx as per pre-operative assessment  Comments: Sedation for spinal

## 2018-09-29 NOTE — Progress Notes (Signed)
AssistedDr. Houser with right, ultrasound guided, adductor canal block. Side rails up, monitors on throughout procedure. See vital signs in flow sheet. Tolerated Procedure well.  

## 2018-09-29 NOTE — Anesthesia Procedure Notes (Signed)
Anesthesia Regional Block: Adductor canal block   Pre-Anesthetic Checklist: ,, timeout performed, Correct Patient, Correct Site, Correct Laterality, Correct Procedure, Correct Position, site marked, Risks and benefits discussed,  Surgical consent,  Pre-op evaluation,  At surgeon's request and post-op pain management  Laterality: Right  Prep: Maximum Sterile Barrier Precautions used, chloraprep       Needles:  Injection technique: Single-shot  Needle Type: Echogenic Needle     Needle Length: 9cm  Needle Gauge: 21     Additional Needles:   Procedures:,,,, ultrasound used (permanent image in chart),,,,  Narrative:  Start time: 09/29/2018 12:14 PM End time: 09/29/2018 12:22 PM Injection made incrementally with aspirations every 5 mL.  Performed by: Personally  Anesthesiologist: Trevor Iha, MD  Additional Notes: Pt tolerated procedure well

## 2018-09-29 NOTE — Anesthesia Procedure Notes (Signed)
Spinal  Patient location during procedure: OR Start time: 09/29/2018 12:56 PM End time: 09/29/2018 1:03 PM Staffing Anesthesiologist: Trevor Iha, MD Performed: anesthesiologist  Preanesthetic Checklist Completed: patient identified, surgical consent, pre-op evaluation, timeout performed, IV checked, risks and benefits discussed and monitors and equipment checked Spinal Block Patient position: sitting Prep: site prepped and draped and DuraPrep Patient monitoring: heart rate, cardiac monitor, continuous pulse ox and blood pressure Approach: midline Location: L2-3 Injection technique: single-shot Needle Needle type: Pencan  Needle gauge: 24 G Needle length: 10 cm Needle insertion depth: 7 cm Assessment Sensory level: T4 Additional Notes 1 attempt at L2-3 uncsucessful and 1 attempt at L1-L2 . Patient tolerated procedure well

## 2018-09-30 ENCOUNTER — Encounter (HOSPITAL_COMMUNITY): Payer: Self-pay | Admitting: Orthopedic Surgery

## 2018-09-30 DIAGNOSIS — K219 Gastro-esophageal reflux disease without esophagitis: Secondary | ICD-10-CM | POA: Diagnosis not present

## 2018-09-30 DIAGNOSIS — Z79899 Other long term (current) drug therapy: Secondary | ICD-10-CM | POA: Diagnosis not present

## 2018-09-30 DIAGNOSIS — Z7982 Long term (current) use of aspirin: Secondary | ICD-10-CM | POA: Diagnosis not present

## 2018-09-30 DIAGNOSIS — I10 Essential (primary) hypertension: Secondary | ICD-10-CM | POA: Diagnosis not present

## 2018-09-30 DIAGNOSIS — M1711 Unilateral primary osteoarthritis, right knee: Secondary | ICD-10-CM | POA: Diagnosis not present

## 2018-09-30 DIAGNOSIS — Z7984 Long term (current) use of oral hypoglycemic drugs: Secondary | ICD-10-CM | POA: Diagnosis not present

## 2018-09-30 DIAGNOSIS — E119 Type 2 diabetes mellitus without complications: Secondary | ICD-10-CM | POA: Diagnosis not present

## 2018-09-30 LAB — BASIC METABOLIC PANEL
ANION GAP: 10 (ref 5–15)
BUN: 16 mg/dL (ref 8–23)
CALCIUM: 8.8 mg/dL — AB (ref 8.9–10.3)
CO2: 24 mmol/L (ref 22–32)
Chloride: 100 mmol/L (ref 98–111)
Creatinine, Ser: 0.91 mg/dL (ref 0.61–1.24)
Glucose, Bld: 251 mg/dL — ABNORMAL HIGH (ref 70–99)
Potassium: 5 mmol/L (ref 3.5–5.1)
Sodium: 134 mmol/L — ABNORMAL LOW (ref 135–145)

## 2018-09-30 LAB — CBC
HEMATOCRIT: 37.6 % — AB (ref 39.0–52.0)
Hemoglobin: 12.5 g/dL — ABNORMAL LOW (ref 13.0–17.0)
MCH: 27.5 pg (ref 26.0–34.0)
MCHC: 33.2 g/dL (ref 30.0–36.0)
MCV: 82.6 fL (ref 80.0–100.0)
PLATELETS: 260 10*3/uL (ref 150–400)
RBC: 4.55 MIL/uL (ref 4.22–5.81)
RDW: 14.2 % (ref 11.5–15.5)
WBC: 9.3 10*3/uL (ref 4.0–10.5)
nRBC: 0 % (ref 0.0–0.2)

## 2018-09-30 LAB — GLUCOSE, CAPILLARY
GLUCOSE-CAPILLARY: 202 mg/dL — AB (ref 70–99)
GLUCOSE-CAPILLARY: 228 mg/dL — AB (ref 70–99)
Glucose-Capillary: 255 mg/dL — ABNORMAL HIGH (ref 70–99)
Glucose-Capillary: 195 mg/dL — ABNORMAL HIGH (ref 70–99)

## 2018-09-30 LAB — HEMOGLOBIN A1C
HEMOGLOBIN A1C: 7.9 % — AB (ref 4.8–5.6)
Mean Plasma Glucose: 180 mg/dL

## 2018-09-30 NOTE — Progress Notes (Signed)
CSW aware of social work consult for possible SNF placement. CSW reviewed chart and notes that patient is recommended for discharge home with home health PT. The chart also notes that the patient wishes to discharge home.  CSW signing off as no social work needs are noted at this time. Please reconsult should social work needs arise.  Enid Cutter, LCSW-A Clinical Social Worker [phone]

## 2018-09-30 NOTE — Progress Notes (Signed)
PATIENT ID: Brian Dominguez  MRN: [id number]  DOB/AGE:  1945-12-14 / 72 y.o.  1 Day Post-Op Procedure(s) (LRB): RIGHT TOTAL KNEE ARTHROPLASTY (Right)    PROGRESS NOTE Subjective: Patient is alert, oriented, no Nausea, no Vomiting, yes passing gas. Taking PO well. Denies SOB, Chest or Calf Pain. Using Incentive Spirometer, PAS in place. Ambulate 4', Patient reports pain as 3/10 .    Objective: Vital signs in last 24 hours: Vitals:   09/29/18 1854 09/29/18 2300 09/30/18 0040 09/30/18 0534  BP: 135/62 134/69 133/72 137/69  Pulse: 82 87 84 83  Resp: 14 15 15    Temp: 98 F (36.7 C) 98.1 F (36.7 C) 98.2 F (36.8 C) 98.4 F (36.9 C)  TempSrc: Oral Oral Oral Oral  SpO2: 100% 98% 97% 97%  Weight:      Height:          Intake/Output from previous day: I/O last 3 completed shifts: In: 4729 [P.O.:960; I.V.:3569; IV Piggyback:200] Out: 3325 [Urine:2975; Blood:350]   Intake/Output this shift: No intake/output data recorded.   LABORATORY DATA: Recent Labs    09/29/18 1706 09/29/18 2302 09/30/18 0443 09/30/18 0709  WBC  --   --  9.3  --   HGB  --   --  12.5*  --   HCT  --   --  37.6*  --   PLT  --   --  260  --   NA  --   --  134*  --   K  --   --  5.0  --   CL  --   --  100  --   CO2  --   --  24  --   BUN  --   --  16  --   CREATININE  --   --  0.91  --   GLUCOSE  --   --  251*  --   GLUCAP 267* 211*  --  255*  CALCIUM  --   --  8.8*  --     Examination: Neurologically intact ABD soft Neurovascular intact Sensation intact distally Intact pulses distally Dorsiflexion/Plantar flexion intact Incision: dressing C/D/I No cellulitis present Compartment soft} Can do straight leg raise without difficulty  Assessment:   1 Day Post-Op Procedure(s) (LRB): RIGHT TOTAL KNEE ARTHROPLASTY (Right) ADDITIONAL DIAGNOSIS: Expected Acute Blood Loss Anemia, Hypertension Anticipated LOS equal to or greater than 2 midnights due to - Age 76 and older with one or more of the  following:  - Obesity  - Expected need for hospital services (PT, OT, Nursing) required for safe  discharge  - Anticipated need for postoperative skilled nursing care or inpatient rehab  - Active co-morbidities: Diabetes OR   - Patient is a high risk of re-admission due to: Barriers to post-acute care (logistical, no family support in home) patient has no family support and no help in town    Plan: PT/OT WBAT, AROM and PROM  DVT Prophylaxis:  SCDx72hrs, ASA 81 mg BID x 2 weeks DISCHARGE PLAN: Patient would like to go home but will need to find support to do this.  He will be here at least 1 more day for further physical therapy DISCHARGE NEEDS: HHPT, Walker and 3-in-1 comode seat     [name] 09/30/2018, 7:26 AM Patient ID: Brian Dominguez, male   DOB: 06-23-1946, 72 y.o.   MRN: [id number]

## 2018-09-30 NOTE — Progress Notes (Signed)
Physical Therapy Treatment Patient Details Name: Brian Dominguez MRN: [id number] DOB: 02-01-1946 Today's Date: 09/30/2018    History of Present Illness  R TKR    PT Comments    POD # 1 am session Assisted with amb in hallway a greater distance and performed a few TE's followed bu ICE.   Follow Up Recommendations  Follow surgeon's recommendation for DC plan and follow-up therapies(HH vs SNF)     Equipment Recommendations  Rolling walker with 5" wheels    Recommendations for Other Services       Precautions / Restrictions Precautions Precautions: Fall Precaution Comments: reviewed no pillow under knee Restrictions Weight Bearing Restrictions: No Other Position/Activity Restrictions: WBAT     Mobility  Bed Mobility               General bed mobility comments: OOB in recliner  Transfers Overall transfer level: Needs assistance Equipment used: Rolling walker (2 wheeled) Transfers: Sit to/from Stand Sit to Stand: Min guard;Supervision         General transfer comment: 25% VC's on proper hand placement and safety with turns  Ambulation/Gait Ambulation/Gait assistance: Min guard;Supervision Gait Distance (Feet): 85 Feet Assistive device: Rolling walker (2 wheeled) Gait Pattern/deviations: Step-to pattern;Decreased stride length;Decreased weight shift to right;Antalgic Gait velocity: decr    General Gait Details: 255 VC's on safety with turns and proper walker to self distance   Stairs             Wheelchair Mobility    Modified Rankin (Stroke Patients Only)       Balance                                            Cognition Arousal/Alertness: Awake/alert Behavior During Therapy: Flat affect Overall Cognitive Status: Within Functional Limits for tasks assessed                                 General Comments: few words but friendly      Exercises  10 reps AP  10 reps knee presses  10 reps towel  squeeze     General Comments        Pertinent Vitals/Pain Pain Assessment: 0-10 Pain Score: 3  Pain Location: R knee  Pain Descriptors / Indicators: Sore Pain Intervention(s): Monitored during session;Premedicated before session;Repositioned;Ice applied    Home Living                      Prior Function            PT Goals (current goals can now be found in the care plan section) Progress towards PT goals: Progressing toward goals    Frequency    7X/week      PT Plan Current plan remains appropriate    Co-evaluation              AM-PAC PT "6 Clicks" Daily Activity  Outcome Measure  Difficulty turning over in bed (including adjusting bedclothes, sheets and blankets)?: A Lot Difficulty moving from lying on back to sitting on the side of the bed? : A Lot Difficulty sitting down on and standing up from a chair with arms (e.g., wheelchair, bedside commode, etc,.)?: A Lot Help needed moving to and from a bed to chair (including a wheelchair)?: A Lot Help  needed walking in hospital room?: A Lot Help needed climbing 3-5 steps with a railing? : A Lot 6 Click Score: 12    End of Session Equipment Utilized During Treatment: Gait belt Activity Tolerance: Patient tolerated treatment well Patient left: in chair;with chair alarm set;with call bell/phone within reach;with SCD's reapplied Nurse Communication: Mobility status PT Visit Diagnosis: Other abnormalities of gait and mobility (R26.89);Difficulty in walking, not elsewhere classified (R26.2)     Time: 1610-9604 PT Time Calculation (min) (ACUTE ONLY): 17 min  Charges:  $Gait Training: 8-22 mins                     Felecia Shelling  PTA Acute  Rehabilitation Services Pager      [phone] Office      [phone]

## 2018-09-30 NOTE — Progress Notes (Signed)
CSW consulted to speak with patient regarding discharge plans and share information related to SNF. CSW and Case management met with patient.  Patient wishes to discharge home with home health services, rather than pursing a SNF placement. Patient reports that he lives with his brother, who will be able to offer assistance.   Case management aware of discharge plan. CSW signing off as no social work needs are apparent at this time.   Stephanie Acre, [place] Social Worker [phone]

## 2018-09-30 NOTE — Care Management Note (Signed)
Case Management Note  Patient Details  Name: Brian Dominguez MRN: [id number] Date of Birth: 21-Mar-1946  Subjective/Objective:      After speaking with CSW, patient now says he will d/c home with his family. Agrees to HHPT.               Action/Plan: Contacted [facility] for [facility] referral, they have accepted. Needs a RW and 3n1, contacted [facility] to deliver to the room.   Expected Discharge Date:                  Expected Discharge Plan:  Home w Home Health Services  In-House Referral:  Clinical Social Work  Discharge planning Services  CM Consult  Post Acute Care Choice:  NA Choice offered to:  Patient  DME Arranged:  N/A DME Agency:  NA  HH Arranged:  PT HH Agency:  [facility]  Status of Service:  Completed, signed off  If discussed at [employer], dates discussed:    Additional Comments:  Alexis Goodell, RN 09/30/2018, 3:12 PM

## 2018-09-30 NOTE — Care Management Note (Signed)
Case Management Note  Patient Details  Name: Brian Dominguez MRN: [id number] Date of Birth: 1946-07-17  Subjective/Objective:  Spoke with patient at bedside. Discussed HH services and who would be helping him at home. He asked about SNF, was hesitant but requested information on going to SNF                  Action/Plan: Contacted CSW to f/u with patient  Expected Discharge Date:                  Expected Discharge Plan:  Skilled Nursing Facility  In-House Referral:  Clinical Social Work  Discharge planning Services  CM Consult  Post Acute Care Choice:  NA Choice offered to:  Patient  DME Arranged:  N/A DME Agency:  NA  HH Arranged:  NA HH Agency:  NA  Status of Service:  Completed, signed off  If discussed at [employer], dates discussed:    Additional Comments:  Alexis Goodell, RN 09/30/2018, 1:18 PM

## 2018-09-30 NOTE — Progress Notes (Signed)
Physical Therapy Treatment Patient Details Name: Brian Dominguez MRN: [id number] DOB: [birth year] Today's Date: 09/30/2018    History of Present Illness 72 YO male s/p R TKR on 09/29/18. PMH includes radiculopathy, HLD, DMII, obesity, depression, HTN, hiatial hernia. Surgical history includes cervical fusion 2013, lumbar fusion 2016, L total shoulder.     PT Comments    POD # 1 pm session Assisted with amb a greater distance then returned to bed to perform some TKR TE's followed by ICE. Pt plans to D/C tomorrow  Follow Up Recommendations  Follow surgeon's recommendation for DC plan and follow-up therapies(HH vs SNF)     Equipment Recommendations  Rolling walker with 5" wheels    Recommendations for Other Services       Precautions / Restrictions Precautions Precautions: Fall Precaution Comments: reviewed no pillow under knee Restrictions Weight Bearing Restrictions: No Other Position/Activity Restrictions: WBAT     Mobility  Bed Mobility Overal bed mobility: Needs Assistance Bed Mobility: Sit to Supine       Sit to supine: Supervision;Min guard   General bed mobility comments: assisted back to bed  Transfers Overall transfer level: Needs assistance Equipment used: Rolling walker (2 wheeled) Transfers: Sit to/from Stand Sit to Stand: Min guard;Supervision         General transfer comment: 25% VC's on proper hand placement and safety with turns  Ambulation/Gait Ambulation/Gait assistance: Min guard;Supervision Gait Distance (Feet): 125 Feet Assistive device: Rolling walker (2 wheeled) Gait Pattern/deviations: Step-to pattern;Decreased stride length;Decreased weight shift to right;Antalgic Gait velocity: decr    General Gait Details: 255 VC's on safety with turns and proper walker to self distance     tolerated an increased distance   Stairs             Wheelchair Mobility    Modified Rankin (Stroke Patients Only)       Balance                                             Cognition Arousal/Alertness: Awake/alert Behavior During Therapy: Flat affect Overall Cognitive Status: Within Functional Limits for tasks assessed                                 General Comments: few words but friendly      Exercises   Total Knee Replacement TE's 10 reps B LE ankle pumps 10 reps towel squeezes 10 reps knee presses 10 reps heel slides  10 reps SAQ's 10 reps SLR's 10 reps ABD Followed by ICE    General Comments        Pertinent Vitals/Pain Pain Assessment: 0-10 Pain Score: 3  Pain Location: R knee  Pain Descriptors / Indicators: Sore Pain Intervention(s): Monitored during session;Premedicated before session;Repositioned;Ice applied    Home Living                      Prior Function            PT Goals (current goals can now be found in the care plan section) Progress towards PT goals: Progressing toward goals    Frequency    7X/week      PT Plan Current plan remains appropriate    Co-evaluation              AM-PAC  PT "6 Clicks" Daily Activity  Outcome Measure  Difficulty turning over in bed (including adjusting bedclothes, sheets and blankets)?: A Lot Difficulty moving from lying on back to sitting on the side of the bed? : A Lot Difficulty sitting down on and standing up from a chair with arms (e.g., wheelchair, bedside commode, etc,.)?: A Lot Help needed moving to and from a bed to chair (including a wheelchair)?: A Lot Help needed walking in hospital room?: A Lot Help needed climbing 3-5 steps with a railing? : A Lot 6 Click Score: 12    End of Session Equipment Utilized During Treatment: Gait belt Activity Tolerance: Patient tolerated treatment well Patient left: in chair;with chair alarm set;with call bell/phone within reach;with SCD's reapplied Nurse Communication: Mobility status PT Visit Diagnosis: Other abnormalities of gait and mobility  (R26.89);Difficulty in walking, not elsewhere classified (R26.2)     Time: 1430-1455 PT Time Calculation (min) (ACUTE ONLY): 25 min  Charges:  $Gait Training: 8-22 mins $Therapeutic Exercise: 8-22 mins                     Felecia Shelling  PTA Acute  Rehabilitation Services Pager      [phone] Office      [phone]

## 2018-09-30 NOTE — Progress Notes (Signed)
Pt belongings transported from PACU  locker to 1342.

## 2018-10-01 DIAGNOSIS — E119 Type 2 diabetes mellitus without complications: Secondary | ICD-10-CM | POA: Diagnosis not present

## 2018-10-01 DIAGNOSIS — Z7984 Long term (current) use of oral hypoglycemic drugs: Secondary | ICD-10-CM | POA: Diagnosis not present

## 2018-10-01 DIAGNOSIS — Z7982 Long term (current) use of aspirin: Secondary | ICD-10-CM | POA: Diagnosis not present

## 2018-10-01 DIAGNOSIS — M1711 Unilateral primary osteoarthritis, right knee: Secondary | ICD-10-CM | POA: Diagnosis not present

## 2018-10-01 DIAGNOSIS — I10 Essential (primary) hypertension: Secondary | ICD-10-CM | POA: Diagnosis not present

## 2018-10-01 DIAGNOSIS — Z79899 Other long term (current) drug therapy: Secondary | ICD-10-CM | POA: Diagnosis not present

## 2018-10-01 DIAGNOSIS — K219 Gastro-esophageal reflux disease without esophagitis: Secondary | ICD-10-CM | POA: Diagnosis not present

## 2018-10-01 LAB — CBC
HEMATOCRIT: 37.3 % — AB (ref 39.0–52.0)
HEMOGLOBIN: 11.9 g/dL — AB (ref 13.0–17.0)
MCH: 26.7 pg (ref 26.0–34.0)
MCHC: 31.9 g/dL (ref 30.0–36.0)
MCV: 83.8 fL (ref 80.0–100.0)
Platelets: 231 10*3/uL (ref 150–400)
RBC: 4.45 MIL/uL (ref 4.22–5.81)
RDW: 14.5 % (ref 11.5–15.5)
WBC: 8.5 10*3/uL (ref 4.0–10.5)
nRBC: 0 % (ref 0.0–0.2)

## 2018-10-01 LAB — GLUCOSE, CAPILLARY
Glucose-Capillary: 163 mg/dL — ABNORMAL HIGH (ref 70–99)
Glucose-Capillary: 187 mg/dL — ABNORMAL HIGH (ref 70–99)

## 2018-10-01 NOTE — Progress Notes (Signed)
PATIENT ID: Brian Dominguez  MRN: [id number]  DOB/AGE:  1946-08-15 / 72 y.o.  2 Days Post-Op Procedure(s) (LRB): RIGHT TOTAL KNEE ARTHROPLASTY (Right)    PROGRESS NOTE Subjective: Patient is alert, oriented, no Nausea, no Vomiting, yes passing gas. Taking PO well. Denies SOB, Chest or Calf Pain. Using Incentive Spirometer, PAS in place. Ambulate WBAT with pt walking 125 ft with therapy, Patient reports pain as 8/10 .    Objective: Vital signs in last 24 hours: Vitals:   09/30/18 0913 09/30/18 1329 09/30/18 2200 10/01/18 0500  BP: (!) 151/72 (!) 156/73 123/69 (!) 122/59  Pulse: 80 84 81 87  Resp: 18 18 16 16   Temp: 98.1 F (36.7 C) 97.8 F (36.6 C) 98.1 F (36.7 C) 98.9 F (37.2 C)  TempSrc: Oral Oral Oral Oral  SpO2: 99% 100% 100% 98%  Weight:      Height:          Intake/Output from previous day: I/O last 3 completed shifts: In: 3149.3 [P.O.:1440; I.V.:1709.3] Out: 3975 [Urine:3975]   Intake/Output this shift: No intake/output data recorded.   LABORATORY DATA: Recent Labs    09/30/18 0443  09/30/18 1721 09/30/18 2026 10/01/18 0516 10/01/18 0710  WBC 9.3  --   --   --  8.5  --   HGB 12.5*  --   --   --  11.9*  --   HCT 37.6*  --   --   --  37.3*  --   PLT 260  --   --   --  231  --   NA 134*  --   --   --   --   --   K 5.0  --   --   --   --   --   CL 100  --   --   --   --   --   CO2 24  --   --   --   --   --   BUN 16  --   --   --   --   --   CREATININE 0.91  --   --   --   --   --   GLUCOSE 251*  --   --   --   --   --   GLUCAP  --    < > 228* 195*  --  163*  CALCIUM 8.8*  --   --   --   --   --    < > = values in this interval not displayed.    Examination: Neurologically intact Neurovascular intact Sensation intact distally Intact pulses distally Dorsiflexion/Plantar flexion intact Incision: dressing C/D/I No cellulitis present Compartment soft}  Assessment:   2 Days Post-Op Procedure(s) (LRB): RIGHT TOTAL KNEE ARTHROPLASTY (Right) ADDITIONAL  DIAGNOSIS: Expected Acute Blood Loss Anemia, Hypertension Anticipated LOS equal to or greater than 2 midnights due to - Age 71 and older with one or more of the following:  - Obesity  - Expected need for hospital services (PT, OT, Nursing) required for safe  discharge  - Anticipated need for postoperative skilled nursing care or inpatient rehab OR  - Patient is a high risk of re-admission due to: Barriers to post-acute care (logistical, no family support in home)    Plan: PT/OT WBAT, AROM and PROM  DVT Prophylaxis:  SCDx72hrs, ASA 81 mg BID x 2 weeks DISCHARGE PLAN: Home DISCHARGE NEEDS: HHPT, Walker and 3-in-1 comode seat  Brian Dominguez 10/01/2018, 7:44 AM
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
## 2018-10-01 NOTE — Progress Notes (Signed)
Physical Therapy Treatment Patient Details Name: Brian Dominguez MRN: [id number] DOB: [birth year] Today's Date: 10/01/2018    History of Present Illness 72 YO male s/p R TKR on 09/29/18. PMH includes radiculopathy, HLD, DMII, obesity, depression, HTN, hiatial hernia. Surgical history includes cervical fusion 2013, lumbar fusion 2016, L total shoulder.     PT Comments    Pt has met PT goals and is ready to DC home from PT standpoint. He ambulated 200' with RW, performed TKA HEP with supervision. Stair training deferred as pt reports he has no steps at his home.    Follow Up Recommendations  Follow surgeon's recommendation for DC plan and follow-up therapies(HH vs SNF)     Equipment Recommendations  Rolling walker with 5" wheels    Recommendations for Other Services       Precautions / Restrictions Precautions Precautions: Fall Precaution Comments: reviewed no pillow under knee Restrictions Weight Bearing Restrictions: No Other Position/Activity Restrictions: WBAT     Mobility  Bed Mobility Overal bed mobility: Needs Assistance Bed Mobility: Supine to Sit     Supine to sit: Modified independent (Device/Increase time);HOB elevated        Transfers Overall transfer level: Needs assistance Equipment used: Rolling walker (2 wheeled) Transfers: Sit to/from Stand Sit to Stand: Supervision         General transfer comment: VCs hand placement  Ambulation/Gait Ambulation/Gait assistance: Supervision Gait Distance (Feet): 200 Feet Assistive device: Rolling walker (2 wheeled) Gait Pattern/deviations: Step-through pattern Gait velocity: decr    General Gait Details: Steady with RW, good sequencing with RW   Stairs Stairs: (pt reports he has no stairs at home)           Wheelchair Mobility    Modified Rankin (Stroke Patients Only)       Balance Overall balance assessment: Modified Independent                                           Cognition Arousal/Alertness: Awake/alert Behavior During Therapy: Flat affect Overall Cognitive Status: Within Functional Limits for tasks assessed                                 General Comments: few words but friendly      Exercises Total Joint Exercises Ankle Circles/Pumps: AROM;Both;10 reps;Supine Quad Sets: AROM;Right;10 reps;Supine Short Arc Quad: AROM;Right;10 reps;Supine Heel Slides: AAROM;Right;10 reps;Supine Hip ABduction/ADduction: AAROM;Right;10 reps;Supine Straight Leg Raises: AROM;Right;10 reps;Supine Long Arc Quad: AROM;Right;10 reps;Seated Knee Flexion: AAROM;AROM;Right;10 reps;Seated Goniometric ROM: 5-80* AAROM R knee    General Comments        Pertinent Vitals/Pain Pain Score: 2  Pain Location: R knee  Pain Descriptors / Indicators: Sore Pain Intervention(s): Limited activity within patient's tolerance;Monitored during session;Premedicated before session;Ice applied    Home Living                      Prior Function            PT Goals (current goals can now be found in the care plan section) Acute Rehab PT Goals Patient Stated Goal: to go fishing PT Goal Formulation: All assessment and education complete, DC therapy Time For Goal Achievement: 10/06/18 Potential to Achieve Goals: Good Progress towards PT goals: Goals met/education completed, patient discharged from PT    Frequency  7X/week      PT Plan Current plan remains appropriate    Co-evaluation              AM-PAC PT "6 Clicks" Daily Activity  Outcome Measure  Difficulty turning over in bed (including adjusting bedclothes, sheets and blankets)?: A Little Difficulty moving from lying on back to sitting on the side of the bed? : A Little Difficulty sitting down on and standing up from a chair with arms (e.g., wheelchair, bedside commode, etc,.)?: A Little Help needed moving to and from a bed to chair (including a wheelchair)?: A Little Help needed  walking in hospital room?: A Little Help needed climbing 3-5 steps with a railing? : A Little 6 Click Score: 18    End of Session Equipment Utilized During Treatment: Gait belt Activity Tolerance: Patient tolerated treatment well Patient left: in chair;with chair alarm set;with call bell/phone within reach Nurse Communication: Mobility status PT Visit Diagnosis: Other abnormalities of gait and mobility (R26.89);Difficulty in walking, not elsewhere classified (R26.2)     Time: 0272-5366 PT Time Calculation (min) (ACUTE ONLY): 27 min  Charges:  $Gait Training: 8-22 mins $Therapeutic Exercise: 8-22 mins                     Blondell Reveal Kistler PT 10/01/2018  Acute Rehabilitation Services Pager [phone] Office [phone]

## 2018-10-02 DIAGNOSIS — Z7984 Long term (current) use of oral hypoglycemic drugs: Secondary | ICD-10-CM | POA: Diagnosis not present

## 2018-10-02 DIAGNOSIS — E119 Type 2 diabetes mellitus without complications: Secondary | ICD-10-CM | POA: Diagnosis not present

## 2018-10-02 DIAGNOSIS — M5410 Radiculopathy, site unspecified: Secondary | ICD-10-CM | POA: Diagnosis not present

## 2018-10-02 DIAGNOSIS — K219 Gastro-esophageal reflux disease without esophagitis: Secondary | ICD-10-CM | POA: Diagnosis not present

## 2018-10-02 DIAGNOSIS — Z471 Aftercare following joint replacement surgery: Secondary | ICD-10-CM | POA: Diagnosis not present

## 2018-10-02 DIAGNOSIS — M542 Cervicalgia: Secondary | ICD-10-CM | POA: Diagnosis not present

## 2018-10-02 DIAGNOSIS — I10 Essential (primary) hypertension: Secondary | ICD-10-CM | POA: Diagnosis not present

## 2018-10-02 DIAGNOSIS — M150 Primary generalized (osteo)arthritis: Secondary | ICD-10-CM | POA: Diagnosis not present

## 2018-10-02 DIAGNOSIS — Z981 Arthrodesis status: Secondary | ICD-10-CM | POA: Diagnosis not present

## 2018-10-02 DIAGNOSIS — Z96651 Presence of right artificial knee joint: Secondary | ICD-10-CM | POA: Diagnosis not present

## 2018-10-02 DIAGNOSIS — Z87891 Personal history of nicotine dependence: Secondary | ICD-10-CM | POA: Diagnosis not present

## 2018-10-03 ENCOUNTER — Other Ambulatory Visit: Payer: Self-pay | Admitting: Family Medicine

## 2018-10-06 DIAGNOSIS — E119 Type 2 diabetes mellitus without complications: Secondary | ICD-10-CM | POA: Diagnosis not present

## 2018-10-06 DIAGNOSIS — Z471 Aftercare following joint replacement surgery: Secondary | ICD-10-CM | POA: Diagnosis not present

## 2018-10-06 DIAGNOSIS — K219 Gastro-esophageal reflux disease without esophagitis: Secondary | ICD-10-CM | POA: Diagnosis not present

## 2018-10-06 DIAGNOSIS — Z7984 Long term (current) use of oral hypoglycemic drugs: Secondary | ICD-10-CM | POA: Diagnosis not present

## 2018-10-06 DIAGNOSIS — I10 Essential (primary) hypertension: Secondary | ICD-10-CM | POA: Diagnosis not present

## 2018-10-06 DIAGNOSIS — M150 Primary generalized (osteo)arthritis: Secondary | ICD-10-CM | POA: Diagnosis not present

## 2018-10-06 DIAGNOSIS — Z96651 Presence of right artificial knee joint: Secondary | ICD-10-CM | POA: Diagnosis not present

## 2018-10-06 DIAGNOSIS — Z981 Arthrodesis status: Secondary | ICD-10-CM | POA: Diagnosis not present

## 2018-10-06 DIAGNOSIS — M5410 Radiculopathy, site unspecified: Secondary | ICD-10-CM | POA: Diagnosis not present

## 2018-10-06 DIAGNOSIS — Z87891 Personal history of nicotine dependence: Secondary | ICD-10-CM | POA: Diagnosis not present

## 2018-10-08 DIAGNOSIS — Z87891 Personal history of nicotine dependence: Secondary | ICD-10-CM | POA: Diagnosis not present

## 2018-10-08 DIAGNOSIS — Z96651 Presence of right artificial knee joint: Secondary | ICD-10-CM | POA: Diagnosis not present

## 2018-10-08 DIAGNOSIS — I10 Essential (primary) hypertension: Secondary | ICD-10-CM | POA: Diagnosis not present

## 2018-10-08 DIAGNOSIS — M150 Primary generalized (osteo)arthritis: Secondary | ICD-10-CM | POA: Diagnosis not present

## 2018-10-08 DIAGNOSIS — Z471 Aftercare following joint replacement surgery: Secondary | ICD-10-CM | POA: Diagnosis not present

## 2018-10-08 DIAGNOSIS — K219 Gastro-esophageal reflux disease without esophagitis: Secondary | ICD-10-CM | POA: Diagnosis not present

## 2018-10-08 DIAGNOSIS — E119 Type 2 diabetes mellitus without complications: Secondary | ICD-10-CM | POA: Diagnosis not present

## 2018-10-08 DIAGNOSIS — Z7984 Long term (current) use of oral hypoglycemic drugs: Secondary | ICD-10-CM | POA: Diagnosis not present

## 2018-10-08 DIAGNOSIS — M5410 Radiculopathy, site unspecified: Secondary | ICD-10-CM | POA: Diagnosis not present

## 2018-10-08 DIAGNOSIS — Z981 Arthrodesis status: Secondary | ICD-10-CM | POA: Diagnosis not present

## 2018-10-09 DIAGNOSIS — E119 Type 2 diabetes mellitus without complications: Secondary | ICD-10-CM | POA: Diagnosis not present

## 2018-10-09 DIAGNOSIS — Z981 Arthrodesis status: Secondary | ICD-10-CM | POA: Diagnosis not present

## 2018-10-09 DIAGNOSIS — M150 Primary generalized (osteo)arthritis: Secondary | ICD-10-CM | POA: Diagnosis not present

## 2018-10-09 DIAGNOSIS — Z96651 Presence of right artificial knee joint: Secondary | ICD-10-CM | POA: Diagnosis not present

## 2018-10-09 DIAGNOSIS — K219 Gastro-esophageal reflux disease without esophagitis: Secondary | ICD-10-CM | POA: Diagnosis not present

## 2018-10-09 DIAGNOSIS — Z471 Aftercare following joint replacement surgery: Secondary | ICD-10-CM | POA: Diagnosis not present

## 2018-10-09 DIAGNOSIS — I10 Essential (primary) hypertension: Secondary | ICD-10-CM | POA: Diagnosis not present

## 2018-10-09 DIAGNOSIS — M5410 Radiculopathy, site unspecified: Secondary | ICD-10-CM | POA: Diagnosis not present

## 2018-10-09 DIAGNOSIS — Z87891 Personal history of nicotine dependence: Secondary | ICD-10-CM | POA: Diagnosis not present

## 2018-10-09 DIAGNOSIS — Z7984 Long term (current) use of oral hypoglycemic drugs: Secondary | ICD-10-CM | POA: Diagnosis not present

## 2018-10-14 DIAGNOSIS — M1711 Unilateral primary osteoarthritis, right knee: Secondary | ICD-10-CM | POA: Diagnosis not present

## 2018-10-14 DIAGNOSIS — K219 Gastro-esophageal reflux disease without esophagitis: Secondary | ICD-10-CM | POA: Diagnosis not present

## 2018-10-14 DIAGNOSIS — M150 Primary generalized (osteo)arthritis: Secondary | ICD-10-CM | POA: Diagnosis not present

## 2018-10-14 DIAGNOSIS — Z87891 Personal history of nicotine dependence: Secondary | ICD-10-CM | POA: Diagnosis not present

## 2018-10-14 DIAGNOSIS — Z981 Arthrodesis status: Secondary | ICD-10-CM | POA: Diagnosis not present

## 2018-10-14 DIAGNOSIS — Z471 Aftercare following joint replacement surgery: Secondary | ICD-10-CM | POA: Diagnosis not present

## 2018-10-14 DIAGNOSIS — M5410 Radiculopathy, site unspecified: Secondary | ICD-10-CM | POA: Diagnosis not present

## 2018-10-14 DIAGNOSIS — E119 Type 2 diabetes mellitus without complications: Secondary | ICD-10-CM | POA: Diagnosis not present

## 2018-10-14 DIAGNOSIS — Z96651 Presence of right artificial knee joint: Secondary | ICD-10-CM | POA: Diagnosis not present

## 2018-10-14 DIAGNOSIS — Z7984 Long term (current) use of oral hypoglycemic drugs: Secondary | ICD-10-CM | POA: Diagnosis not present

## 2018-10-14 DIAGNOSIS — I10 Essential (primary) hypertension: Secondary | ICD-10-CM | POA: Diagnosis not present

## 2018-10-16 DIAGNOSIS — Z981 Arthrodesis status: Secondary | ICD-10-CM | POA: Diagnosis not present

## 2018-10-16 DIAGNOSIS — Z471 Aftercare following joint replacement surgery: Secondary | ICD-10-CM | POA: Diagnosis not present

## 2018-10-16 DIAGNOSIS — M150 Primary generalized (osteo)arthritis: Secondary | ICD-10-CM | POA: Diagnosis not present

## 2018-10-16 DIAGNOSIS — E119 Type 2 diabetes mellitus without complications: Secondary | ICD-10-CM | POA: Diagnosis not present

## 2018-10-16 DIAGNOSIS — M5410 Radiculopathy, site unspecified: Secondary | ICD-10-CM | POA: Diagnosis not present

## 2018-10-16 DIAGNOSIS — I10 Essential (primary) hypertension: Secondary | ICD-10-CM | POA: Diagnosis not present

## 2018-10-16 DIAGNOSIS — Z7984 Long term (current) use of oral hypoglycemic drugs: Secondary | ICD-10-CM | POA: Diagnosis not present

## 2018-10-16 DIAGNOSIS — Z87891 Personal history of nicotine dependence: Secondary | ICD-10-CM | POA: Diagnosis not present

## 2018-10-16 DIAGNOSIS — Z96651 Presence of right artificial knee joint: Secondary | ICD-10-CM | POA: Diagnosis not present

## 2018-10-16 DIAGNOSIS — K219 Gastro-esophageal reflux disease without esophagitis: Secondary | ICD-10-CM | POA: Diagnosis not present

## 2018-10-22 ENCOUNTER — Ambulatory Visit: Payer: Medicare Other | Attending: Orthopedic Surgery

## 2018-10-22 DIAGNOSIS — R262 Difficulty in walking, not elsewhere classified: Secondary | ICD-10-CM | POA: Diagnosis not present

## 2018-10-22 DIAGNOSIS — M545 Low back pain: Secondary | ICD-10-CM | POA: Diagnosis not present

## 2018-10-22 DIAGNOSIS — Z96651 Presence of right artificial knee joint: Secondary | ICD-10-CM | POA: Insufficient documentation

## 2018-10-22 DIAGNOSIS — R600 Localized edema: Secondary | ICD-10-CM

## 2018-10-22 DIAGNOSIS — M25561 Pain in right knee: Secondary | ICD-10-CM | POA: Diagnosis not present

## 2018-10-22 DIAGNOSIS — G8929 Other chronic pain: Secondary | ICD-10-CM | POA: Diagnosis not present

## 2018-10-22 DIAGNOSIS — M25661 Stiffness of right knee, not elsewhere classified: Secondary | ICD-10-CM | POA: Diagnosis not present
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
## 2018-10-24 DIAGNOSIS — M47814 Spondylosis without myelopathy or radiculopathy, thoracic region: Secondary | ICD-10-CM | POA: Diagnosis not present

## 2018-10-24 DIAGNOSIS — M546 Pain in thoracic spine: Secondary | ICD-10-CM | POA: Diagnosis not present

## 2018-10-24 DIAGNOSIS — M9902 Segmental and somatic dysfunction of thoracic region: Secondary | ICD-10-CM | POA: Diagnosis not present

## 2018-10-27 ENCOUNTER — Ambulatory Visit: Payer: Medicare Other

## 2018-10-27 DIAGNOSIS — R600 Localized edema: Secondary | ICD-10-CM

## 2018-10-27 DIAGNOSIS — M545 Low back pain, unspecified: Secondary | ICD-10-CM

## 2018-10-27 DIAGNOSIS — M25661 Stiffness of right knee, not elsewhere classified: Secondary | ICD-10-CM | POA: Diagnosis not present

## 2018-10-27 DIAGNOSIS — Z96651 Presence of right artificial knee joint: Secondary | ICD-10-CM

## 2018-10-27 DIAGNOSIS — M25561 Pain in right knee: Secondary | ICD-10-CM

## 2018-10-27 DIAGNOSIS — R262 Difficulty in walking, not elsewhere classified: Secondary | ICD-10-CM | POA: Diagnosis not present

## 2018-10-27 DIAGNOSIS — G8929 Other chronic pain: Secondary | ICD-10-CM
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
## 2018-10-28 ENCOUNTER — Ambulatory Visit: Payer: Medicare Other

## 2018-10-28 DIAGNOSIS — M25561 Pain in right knee: Secondary | ICD-10-CM

## 2018-10-28 DIAGNOSIS — R262 Difficulty in walking, not elsewhere classified: Secondary | ICD-10-CM | POA: Diagnosis not present

## 2018-10-28 DIAGNOSIS — R600 Localized edema: Secondary | ICD-10-CM | POA: Diagnosis not present

## 2018-10-28 DIAGNOSIS — G8929 Other chronic pain: Secondary | ICD-10-CM | POA: Diagnosis not present

## 2018-10-28 DIAGNOSIS — Z96651 Presence of right artificial knee joint: Secondary | ICD-10-CM | POA: Diagnosis not present

## 2018-10-28 DIAGNOSIS — M25661 Stiffness of right knee, not elsewhere classified: Secondary | ICD-10-CM

## 2018-10-28 DIAGNOSIS — M545 Low back pain: Secondary | ICD-10-CM | POA: Diagnosis not present

## 2018-10-28 NOTE — Patient Instructions (Signed)
HIP: Abduction - Side-Lying    Lie on side, legs straight and in line with trunk. Squeeze glutes. Raise top leg up and slightly back. Point toes forward. _10-15__ reps per set, _1__ sets per day, __2_ days per week                Bend bottom leg to stabilize pelvis.  Copyright  VHI. All rights reserved.  Knee Flexion (Standing)    Stand with support. Squeeze pelvic floor and hold. Bend right knee without moving hip. Hold for  1-3__ seconds. Relax for __1-2_ seconds. Repeat __10-15_ times. Do _2__ times a day. Repeat with other leg. Add ___ lb weight.  Copyright  VHI. All rights reserved.  Hip Flexion (Standing)    Stand with support. Squeeze pelvic floor and hold. Lift right knee upward. Hold for   2-5___ seconds. Relax for ___ seconds. Repeat _10 15__ times. Do __2_ times a day. Repeat with other leg. Add ___ lb weight.    Copyright  VHI. All rights reserved.  Flexion and Extension - Supine    Raise straight uninvolved leg toward ceiling _10-15__ times. Repeat with other leg and hip. Do _2__ times per day.  Copyright  VHI. All rights reserved.  Heel Slides    Squeeze pelvic floor and hold. Slide left heel along bed towards bottom. Hold for _30__ seconds. Slide back to flat knee position. Repeat __5_ times. Do _2__ times a day. Repeat with other leg.    Copyright  VHI. All rights reserved.  Quad Sets    Slowly tighten thigh muscles of straight, left leg while counting out loud to _10-___. Relax. Repeat _10___ times. Do __6-8__ sessions per day.  http://gt2.exer.us/292   Copyright  VHI. All rights reserved.
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
## 2018-11-02 DIAGNOSIS — M542 Cervicalgia: Secondary | ICD-10-CM | POA: Diagnosis not present

## 2018-11-03 ENCOUNTER — Ambulatory Visit: Payer: Medicare Other | Admitting: Physical Therapy

## 2018-11-03 DIAGNOSIS — M25561 Pain in right knee: Secondary | ICD-10-CM | POA: Diagnosis not present

## 2018-11-03 DIAGNOSIS — G8929 Other chronic pain: Secondary | ICD-10-CM | POA: Diagnosis not present

## 2018-11-03 DIAGNOSIS — R262 Difficulty in walking, not elsewhere classified: Secondary | ICD-10-CM

## 2018-11-03 DIAGNOSIS — M25661 Stiffness of right knee, not elsewhere classified: Secondary | ICD-10-CM | POA: Diagnosis not present

## 2018-11-03 DIAGNOSIS — M545 Low back pain: Secondary | ICD-10-CM | POA: Diagnosis not present

## 2018-11-03 DIAGNOSIS — Z96651 Presence of right artificial knee joint: Secondary | ICD-10-CM | POA: Diagnosis not present

## 2018-11-03 DIAGNOSIS — R600 Localized edema: Secondary | ICD-10-CM | POA: Diagnosis not present
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
## 2018-11-05 ENCOUNTER — Ambulatory Visit: Payer: Medicare Other | Admitting: Physical Therapy

## 2018-11-05 ENCOUNTER — Encounter: Payer: Self-pay | Admitting: Physical Therapy

## 2018-11-05 DIAGNOSIS — K838 Other specified diseases of biliary tract: Secondary | ICD-10-CM | POA: Diagnosis not present

## 2018-11-05 DIAGNOSIS — M25561 Pain in right knee: Secondary | ICD-10-CM

## 2018-11-05 DIAGNOSIS — M25661 Stiffness of right knee, not elsewhere classified: Secondary | ICD-10-CM

## 2018-11-05 DIAGNOSIS — R1011 Right upper quadrant pain: Secondary | ICD-10-CM | POA: Diagnosis not present

## 2018-11-05 DIAGNOSIS — Z96651 Presence of right artificial knee joint: Secondary | ICD-10-CM

## 2018-11-05 DIAGNOSIS — R600 Localized edema: Secondary | ICD-10-CM

## 2018-11-05 DIAGNOSIS — R262 Difficulty in walking, not elsewhere classified: Secondary | ICD-10-CM
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
## 2018-11-07 ENCOUNTER — Emergency Department (HOSPITAL_COMMUNITY): Payer: Medicare Other

## 2018-11-07 ENCOUNTER — Observation Stay (HOSPITAL_COMMUNITY): Payer: Medicare Other

## 2018-11-07 ENCOUNTER — Other Ambulatory Visit: Payer: Self-pay

## 2018-11-07 ENCOUNTER — Encounter (HOSPITAL_COMMUNITY): Payer: Self-pay

## 2018-11-07 ENCOUNTER — Inpatient Hospital Stay (HOSPITAL_COMMUNITY)
Admission: EM | Admit: 2018-11-07 | Discharge: 2018-11-10 | DRG: 446 | Disposition: A | Discharge status: Home / Self Care | Payer: Medicare Other | Attending: Family Medicine | Admitting: Family Medicine

## 2018-11-07 DIAGNOSIS — E119 Type 2 diabetes mellitus without complications: Secondary | ICD-10-CM | POA: Diagnosis present

## 2018-11-07 DIAGNOSIS — Z981 Arthrodesis status: Secondary | ICD-10-CM

## 2018-11-07 DIAGNOSIS — N401 Enlarged prostate with lower urinary tract symptoms: Secondary | ICD-10-CM | POA: Diagnosis present

## 2018-11-07 DIAGNOSIS — M1990 Unspecified osteoarthritis, unspecified site: Secondary | ICD-10-CM | POA: Diagnosis present

## 2018-11-07 DIAGNOSIS — K5730 Diverticulosis of large intestine without perforation or abscess without bleeding: Secondary | ICD-10-CM | POA: Diagnosis present

## 2018-11-07 DIAGNOSIS — K838 Other specified diseases of biliary tract: Secondary | ICD-10-CM | POA: Diagnosis not present

## 2018-11-07 DIAGNOSIS — K219 Gastro-esophageal reflux disease without esophagitis: Secondary | ICD-10-CM | POA: Diagnosis not present

## 2018-11-07 DIAGNOSIS — D649 Anemia, unspecified: Secondary | ICD-10-CM | POA: Diagnosis present

## 2018-11-07 DIAGNOSIS — Z888 Allergy status to other drugs, medicaments and biological substances status: Secondary | ICD-10-CM

## 2018-11-07 DIAGNOSIS — R1011 Right upper quadrant pain: Secondary | ICD-10-CM

## 2018-11-07 DIAGNOSIS — E785 Hyperlipidemia, unspecified: Secondary | ICD-10-CM | POA: Diagnosis present

## 2018-11-07 DIAGNOSIS — K2950 Unspecified chronic gastritis without bleeding: Secondary | ICD-10-CM | POA: Diagnosis not present

## 2018-11-07 DIAGNOSIS — K2990 Gastroduodenitis, unspecified, without bleeding: Secondary | ICD-10-CM

## 2018-11-07 DIAGNOSIS — Z96651 Presence of right artificial knee joint: Secondary | ICD-10-CM | POA: Diagnosis present

## 2018-11-07 DIAGNOSIS — M549 Dorsalgia, unspecified: Secondary | ICD-10-CM | POA: Diagnosis not present

## 2018-11-07 DIAGNOSIS — G8929 Other chronic pain: Secondary | ICD-10-CM | POA: Diagnosis present

## 2018-11-07 DIAGNOSIS — Z79899 Other long term (current) drug therapy: Secondary | ICD-10-CM

## 2018-11-07 DIAGNOSIS — Z8249 Family history of ischemic heart disease and other diseases of the circulatory system: Secondary | ICD-10-CM

## 2018-11-07 DIAGNOSIS — Z791 Long term (current) use of non-steroidal anti-inflammatories (NSAID): Secondary | ICD-10-CM | POA: Diagnosis not present

## 2018-11-07 DIAGNOSIS — K2970 Gastritis, unspecified, without bleeding: Secondary | ICD-10-CM

## 2018-11-07 DIAGNOSIS — Z9049 Acquired absence of other specified parts of digestive tract: Secondary | ICD-10-CM

## 2018-11-07 DIAGNOSIS — E669 Obesity, unspecified: Secondary | ICD-10-CM | POA: Diagnosis present

## 2018-11-07 DIAGNOSIS — Z7951 Long term (current) use of inhaled steroids: Secondary | ICD-10-CM

## 2018-11-07 DIAGNOSIS — I10 Essential (primary) hypertension: Secondary | ICD-10-CM | POA: Diagnosis present

## 2018-11-07 DIAGNOSIS — K5900 Constipation, unspecified: Secondary | ICD-10-CM

## 2018-11-07 DIAGNOSIS — R350 Frequency of micturition: Secondary | ICD-10-CM | POA: Diagnosis present

## 2018-11-07 DIAGNOSIS — Z7984 Long term (current) use of oral hypoglycemic drugs: Secondary | ICD-10-CM | POA: Diagnosis not present

## 2018-11-07 DIAGNOSIS — R748 Abnormal levels of other serum enzymes: Secondary | ICD-10-CM | POA: Diagnosis not present

## 2018-11-07 DIAGNOSIS — Z7982 Long term (current) use of aspirin: Secondary | ICD-10-CM | POA: Diagnosis not present

## 2018-11-07 DIAGNOSIS — F329 Major depressive disorder, single episode, unspecified: Secondary | ICD-10-CM | POA: Diagnosis present

## 2018-11-07 DIAGNOSIS — K7689 Other specified diseases of liver: Secondary | ICD-10-CM | POA: Diagnosis not present

## 2018-11-07 DIAGNOSIS — Z6829 Body mass index (BMI) 29.0-29.9, adult: Secondary | ICD-10-CM

## 2018-11-07 DIAGNOSIS — Z96612 Presence of left artificial shoulder joint: Secondary | ICD-10-CM | POA: Diagnosis present

## 2018-11-07 DIAGNOSIS — R932 Abnormal findings on diagnostic imaging of liver and biliary tract: Secondary | ICD-10-CM | POA: Diagnosis not present

## 2018-11-07 DIAGNOSIS — F17210 Nicotine dependence, cigarettes, uncomplicated: Secondary | ICD-10-CM | POA: Diagnosis present

## 2018-11-07 LAB — CBC WITH DIFFERENTIAL/PLATELET
ABS IMMATURE GRANULOCYTES: 0.01 10*3/uL (ref 0.00–0.07)
Basophils Absolute: 0 10*3/uL (ref 0.0–0.1)
Basophils Relative: 1 %
Eosinophils Absolute: 0.3 10*3/uL (ref 0.0–0.5)
Eosinophils Relative: 6 %
HCT: 40.3 % (ref 39.0–52.0)
HEMOGLOBIN: 12.5 g/dL — AB (ref 13.0–17.0)
IMMATURE GRANULOCYTES: 0 %
LYMPHS PCT: 33 %
Lymphs Abs: 1.9 10*3/uL (ref 0.7–4.0)
MCH: 25.3 pg — AB (ref 26.0–34.0)
MCHC: 31 g/dL (ref 30.0–36.0)
MCV: 81.6 fL (ref 80.0–100.0)
MONOS PCT: 9 %
Monocytes Absolute: 0.5 10*3/uL (ref 0.1–1.0)
NEUTROS ABS: 3 10*3/uL (ref 1.7–7.7)
NEUTROS PCT: 51 %
Platelets: 303 10*3/uL (ref 150–400)
RBC: 4.94 MIL/uL (ref 4.22–5.81)
RDW: 14.4 % (ref 11.5–15.5)
WBC: 5.7 10*3/uL (ref 4.0–10.5)
nRBC: 0 % (ref 0.0–0.2)

## 2018-11-07 LAB — URINALYSIS, ROUTINE W REFLEX MICROSCOPIC
Bilirubin Urine: NEGATIVE
Glucose, UA: NEGATIVE mg/dL
HGB URINE DIPSTICK: NEGATIVE
Ketones, ur: NEGATIVE mg/dL
LEUKOCYTES UA: NEGATIVE
Nitrite: NEGATIVE
PROTEIN: NEGATIVE mg/dL
SPECIFIC GRAVITY, URINE: 1.018 (ref 1.005–1.030)
pH: 7 (ref 5.0–8.0)

## 2018-11-07 LAB — COMPREHENSIVE METABOLIC PANEL
ALBUMIN: 4.1 g/dL (ref 3.5–5.0)
ALT: 15 U/L (ref 0–44)
AST: 16 U/L (ref 15–41)
Alkaline Phosphatase: 57 U/L (ref 38–126)
Anion gap: 6 (ref 5–15)
BUN: 6 mg/dL — AB (ref 8–23)
CHLORIDE: 104 mmol/L (ref 98–111)
CO2: 27 mmol/L (ref 22–32)
Calcium: 9.6 mg/dL (ref 8.9–10.3)
Creatinine, Ser: 0.97 mg/dL (ref 0.61–1.24)
GFR calc Af Amer: >60 mL/min (ref >60)
GFR calc non Af Amer: >60 mL/min (ref >60)
Glucose, Bld: 157 mg/dL — ABNORMAL HIGH (ref 70–99)
Potassium: 3.8 mmol/L (ref 3.5–5.1)
SODIUM: 137 mmol/L (ref 135–145)
Total Bilirubin: 0.6 mg/dL (ref 0.3–1.2)
Total Protein: 7.4 g/dL (ref 6.5–8.1)

## 2018-11-07 LAB — LIPASE, BLOOD: Lipase: 56 U/L — ABNORMAL HIGH (ref 11–51)

## 2018-11-07 LAB — GLUCOSE, CAPILLARY: Glucose-Capillary: 165 mg/dL — ABNORMAL HIGH (ref 70–99)

## 2018-11-07 MED ORDER — LORATADINE 10 MG PO TABS
10.0000 mg | ORAL_TABLET | Freq: Every day | ORAL | Status: DC
  Administered 2018-11-08 – 2018-11-10 (×3): 10 mg via ORAL
  Filled 2018-11-07 (×3): qty 1

## 2018-11-07 MED ORDER — VITAMIN D 25 MCG (1000 UNIT) PO TABS
1000.0000 [IU] | ORAL_TABLET | Freq: Every day | ORAL | Status: DC
  Administered 2018-11-08 – 2018-11-10 (×3): 1000 [IU] via ORAL
  Filled 2018-11-07 (×3): qty 1

## 2018-11-07 MED ORDER — HYDROMORPHONE HCL 1 MG/ML IJ SOLN
0.5000 mg | Freq: Once | INTRAMUSCULAR | Status: AC
  Administered 2018-11-07: 0.5 mg via INTRAVENOUS
  Filled 2018-11-07: qty 1

## 2018-11-07 MED ORDER — PANTOPRAZOLE SODIUM 40 MG IV SOLR
40.0000 mg | Freq: Once | INTRAVENOUS | Status: AC
  Administered 2018-11-07: 40 mg via INTRAVENOUS
  Filled 2018-11-07: qty 40

## 2018-11-07 MED ORDER — FAMOTIDINE IN NACL 20-0.9 MG/50ML-% IV SOLN
20.0000 mg | Freq: Once | INTRAVENOUS | Status: AC
  Administered 2018-11-07: 20 mg via INTRAVENOUS
  Filled 2018-11-07: qty 50

## 2018-11-07 MED ORDER — LORAZEPAM 2 MG/ML IJ SOLN
1.0000 mg | Freq: Once | INTRAMUSCULAR | Status: AC
  Administered 2018-11-07: 1 mg via INTRAVENOUS
  Filled 2018-11-07: qty 1

## 2018-11-07 MED ORDER — MORPHINE SULFATE (PF) 4 MG/ML IV SOLN
4.0000 mg | Freq: Once | INTRAVENOUS | Status: AC
  Administered 2018-11-07: 4 mg via INTRAVENOUS
  Filled 2018-11-07: qty 1

## 2018-11-07 MED ORDER — INSULIN ASPART 100 UNIT/ML ~~LOC~~ SOLN
0.0000 [IU] | SUBCUTANEOUS | Status: DC
  Administered 2018-11-07 – 2018-11-08 (×3): 2 [IU] via SUBCUTANEOUS
  Administered 2018-11-08: 1 [IU] via SUBCUTANEOUS
  Administered 2018-11-09: 2 [IU] via SUBCUTANEOUS
  Administered 2018-11-09: 1 [IU] via SUBCUTANEOUS
  Administered 2018-11-09: 2 [IU] via SUBCUTANEOUS
  Administered 2018-11-09 – 2018-11-10 (×4): 1 [IU] via SUBCUTANEOUS
  Administered 2018-11-10: 2 [IU] via SUBCUTANEOUS
  Administered 2018-11-10: 1 [IU] via SUBCUTANEOUS

## 2018-11-07 MED ORDER — ACETAMINOPHEN 325 MG PO TABS
650.0000 mg | ORAL_TABLET | Freq: Four times a day (QID) | ORAL | Status: DC | PRN
  Administered 2018-11-10: 650 mg via ORAL
  Filled 2018-11-07: qty 2

## 2018-11-07 MED ORDER — ONDANSETRON HCL 4 MG/2ML IJ SOLN
4.0000 mg | Freq: Once | INTRAMUSCULAR | Status: AC
  Administered 2018-11-07: 4 mg via INTRAVENOUS
  Filled 2018-11-07: qty 2

## 2018-11-07 MED ORDER — PANTOPRAZOLE SODIUM 40 MG PO TBEC
40.0000 mg | DELAYED_RELEASE_TABLET | Freq: Every day | ORAL | Status: DC
  Administered 2018-11-08: 40 mg via ORAL
  Filled 2018-11-07: qty 1

## 2018-11-07 MED ORDER — GADOBUTROL 1 MMOL/ML IV SOLN
10.0000 mL | Freq: Once | INTRAVENOUS | Status: AC | PRN
  Administered 2018-11-07: 10 mL via INTRAVENOUS

## 2018-11-07 MED ORDER — SODIUM CHLORIDE 0.9 % IV SOLN
INTRAVENOUS | Status: DC
  Administered 2018-11-07 – 2018-11-08 (×3): via INTRAVENOUS

## 2018-11-07 MED ORDER — AMLODIPINE BESYLATE 10 MG PO TABS
10.0000 mg | ORAL_TABLET | Freq: Every day | ORAL | Status: DC
  Administered 2018-11-08 – 2018-11-10 (×3): 10 mg via ORAL
  Filled 2018-11-07 (×3): qty 1

## 2018-11-07 MED ORDER — MORPHINE SULFATE (PF) 4 MG/ML IV SOLN
4.0000 mg | INTRAVENOUS | Status: DC | PRN
  Administered 2018-11-08 (×2): 4 mg via INTRAVENOUS
  Filled 2018-11-07 (×2): qty 1

## 2018-11-07 MED ORDER — FLUTICASONE PROPIONATE 50 MCG/ACT NA SUSP
2.0000 | Freq: Every day | NASAL | Status: DC
  Administered 2018-11-08 – 2018-11-10 (×4): 2 via NASAL
  Filled 2018-11-07: qty 16

## 2018-11-07 MED ORDER — HEPARIN SODIUM (PORCINE) 5000 UNIT/ML IJ SOLN
5000.0000 [IU] | Freq: Three times a day (TID) | INTRAMUSCULAR | Status: AC
  Administered 2018-11-07 – 2018-11-08 (×4): 5000 [IU] via SUBCUTANEOUS
  Filled 2018-11-07 (×4): qty 1

## 2018-11-07 MED ORDER — SODIUM CHLORIDE 0.9 % IV BOLUS
1000.0000 mL | Freq: Once | INTRAVENOUS | Status: AC
  Administered 2018-11-07: 1000 mL via INTRAVENOUS

## 2018-11-07 MED ORDER — IOHEXOL 300 MG/ML  SOLN
100.0000 mL | Freq: Once | INTRAMUSCULAR | Status: AC | PRN
  Administered 2018-11-07: 100 mL via INTRAVENOUS
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
## 2018-11-07 NOTE — ED Provider Notes (Signed)
Care assumed from previous provider PA Joy. Please see note for further details. Case discussed, plan agreed upon. Will consult medicine for admission.   Briefly, patient is a 72 y.o. male who presented to ED for burning RUQ pain. Initially was just after he ate, but now constant pain. Lipase 56. LFT's reassuring. CT obtained:   IMPRESSION: 1. Mild intra and extrahepatic biliary ductal dilation without a visible obstructing stone or mass. This is a new finding since July, 2019. ERCP or MRCP may be helpful in further evaluation.  Spoke with GI who recommended MRCP, admit to medicine for obs / pain control.  Consulted Family Practice Teaching Service who will admit.    Ward, Chase PicketJaime Pilcher, PA-C 11/07/18 1718    Geoffery Lyonselo, Douglas, MD 11/07/18 1806

## 2018-11-07 NOTE — ED Notes (Signed)
REPAGED GMA/BUCCINI ON FOR HUNG x 3

## 2018-11-07 NOTE — ED Notes (Signed)
ED Provider at bedside. 

## 2018-11-07 NOTE — ED Notes (Signed)
Pt still unable to provide urine specimen 

## 2018-11-07 NOTE — ED Notes (Signed)
repaged HUNG @ [phone]

## 2018-11-07 NOTE — ED Notes (Signed)
Admitting providers at bedside

## 2018-11-07 NOTE — ED Notes (Signed)
PAGED ME/KEITH HARRIS TO DR. Rosalia HammersAY

## 2018-11-07 NOTE — ED Notes (Signed)
Bladder scan performed, pt has 56ml in bladder. Still does not have to urinate

## 2018-11-07 NOTE — ED Notes (Signed)
Paged GMA/HUNG

## 2018-11-07 NOTE — ED Notes (Signed)
MRI called and stated that pt was complaining of claustrophobia, PA notified and ordering 1mg  ativan.

## 2018-11-07 NOTE — ED Notes (Signed)
Pt to go to MRI and then upstairs.

## 2018-11-07 NOTE — ED Notes (Signed)
MRI called to ask for order clarification, pt is not claustrophobic, MRI to see pt as soon as they get an open scanner.
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
## 2018-11-07 NOTE — ED Notes (Signed)
Pt states he is still unable to provide urine.

## 2018-11-07 NOTE — ED Notes (Addendum)
Pt unable to give urine specimen at this time. This RN allowed pt to stand up to use urinal and ran water, pt still unable to go.

## 2018-11-07 NOTE — ED Triage Notes (Signed)
Pt endorses abd bloating x 3 days with burning that is associated after eating meals. Pt has hx of GERD. Generalized in nature, no n/v/d.

## 2018-11-07 NOTE — Discharge Summary (Signed)
[facility] Discharge Summary  Patient name: Brian Dominguez Medical record number: [id number] Date of birth: 16-Oct-1946 Age: 72 y.o. Gender: male Date of Admission: 11/07/2018  Date of Discharge: 12/2 Admitting Physician: Kathrene Alu, MD  Primary Care Provider: Everrett Coombe, MD Consultants: GI  Indication for Hospitalization: Abdominal pain  Discharge Diagnoses/Problem List:  Non-bleeding erosive gastropathy, CBD dilation of unknown origin  Right knee osteoarthritis S/P total right knee repair Type 2 diabetes Lipidemia Hypertension Obesity MDD Hiatal hernia S/P repair Cholecystectomy Tobacco use disorder GERD Diverticulosis BPH Chronic prostatitis  Disposition: Discharge home  Discharge Condition: Stable   Discharge Exam:  General: Alert, NAD HEENT: NCAT, MMM, oropharynx nonerythematous  Cardiac: RRR no m/g/r Lungs: Clear bilaterally, no increased WOB  Abdomen: soft, non-tender, non-distended, normoactive BS Msk: Moves all extremities spontaneously  Ext: Warm, dry, 2+ distal pulses, no edema    Brief Hospital Course:  Brian Dominguez is a 72 year old male with history significant for GERD and previous cholecystectomy that presented for worsening RUQ pain that was ultimately found to have a 10 mm common bile duct dilation and mild intrahepatic dilation seen in CT abdomen, which was a new finding since his recent CT abdomen in 06/2018. Reassured he remained afebrile, without leukocytosis, or N/V/D for the duration of his stay. GI was consulted and performed a MRCP on 11/29 and EUS on 12/1 showing non-bleeding erosive gastropathy and isoechoic material resembling sludge near the cystic duct remnant, but no stones or masses identified. During his time, his LFT's trended up to 70's, but decreased to 37/51 by discharge. Etiology remains uncertain, but suspect gastropathy mainly contributing to symptoms given his history of burning epigastric pain that  moved to his RUQ especially after meals and EGD findings. However, continue to consider intermittent obstructing sludge causing elevations in his LFTs and pain that just wasn't caught on imaging for that moment of time. By discharge, he was hemodynamically stable and tolerating PO without concern. He was discharged with a PPI BID and Carafate to help with abdominal pain.   Issues for Follow Up:  1. Continue to monitor abdominal pain, needs to follow up with GI (Has seen Dr. Benson Norway in the past). Please f/u with a CMP or hepatic panel to monitor AST/ALT, bili, and alk phos. AST 37, ALT 56 at discharge.  2. We stopped his meloxicam for arthritis due to gastropathy on EGD evaluation and will continue to recommend avoidance of NSAID's. Recommended topical therapies at discharge. However, did give a few lidocaine patches for chronic back pain that appeared to help patient.  3. Gastric surgical pathology returned after patient's discharge, please ensure he is aware that it showed mild chronic gastritis, was negative for H. Pylori, and no signs of any metaplasia/dysplasia/malignancy.    Significant Procedures:  MRCP : 11/07/2018 EUS/EGD on 12/1 and findings per GI:   Impressions:  EGD Impression: - No gross lesions in esophagus. Z-line irregular, 42 cm from the incisors. - Non-bleeding erosive gastropathy. Otherwise, no gross lesions in the stomach. Biopsied for HP. - No gross lesions in the duodenal bulb, in the first portion of the duodenum, in the second portion of the duodenum and in the major papilla.  EUS Impression: - Isoechoic material consistent with possible sludge was visualized endosonographically in the cystic duct remnant. There was no evidence of sludge/debris/stone material within the entire CBD & CHD region. - The pancreatic duct had a normal endosonographic appearance in the pancreatic head, genu of the pancreas, body of  the pancreas and tail of the pancreas. No  parenchymal abnormalities noted.  "His cystic duct take off shows evidence of isoechoic material that could be sludge/debris. However, the cystic duct itself is high and not dilated significantly on EUS or MRI imaging. In theory, patient could potentially be having sludge/debris drop into his CBD and cause intermittent obstructive-like pictures and cause LFT abnormalities, however, going against this, is his transaminases are elevated currently and there is no debris within the entire CBD/CHD region. It is not clear if a sphincterotomy to allow things to flow should this occur is worthwhile to pursue but I do think that further discussion and monitoring of LFTs should be performed. Functional disorder of the sphincter could be considered if he continues to have recurrent LFT abnormalities with pain/discomfort in the region and a persistently dilated duct, however the risk of Post-ERCP pancreatitis in those patients can be as high as 20-25% with sphincterotomy. A liver biopsy could also be considered in the future."   Significant Labs and Imaging:  Recent Labs  Lab 11/08/18 0349 11/09/18 0510 11/10/18 0211  WBC 3.9* 4.0 4.2  HGB 12.3* 12.0* 12.0*  HCT 38.0* 37.0* 37.6*  PLT 268 269 287   Recent Labs  Lab 11/07/18 0947 11/08/18 0349 11/09/18 0510 11/10/18 0211  NA 137 137 138 138  K 3.8 3.8 3.9 4.0  CL 104 103 101 104  CO2 _0 GLUCOSE 157* 124* 124* 165*  BUN 6* 5* 9 8  CREATININE 0.97 0.92 1.06 0.93  CALCIUM 9.6 9.0 9.0 9.2  ALKPHOS 57 79 87 75  AST 16 70* 75* 37  ALT 15 55* 84* 56*  ALBUMIN 4.1 3.5 3.6 3.7     Results/Tests Pending at Time of Discharge: Surgical pathology (has now resulted)  Discharge Medications:  Allergies as of 11/10/2018      Reactions   Enalapril Swelling, Other (See Comments)   Angioedema of the lips with enalapril      Medication List    STOP taking these medications   meloxicam 15 MG tablet Commonly known as:  MOBIC    oxyCODONE-acetaminophen 5-325 MG tablet Commonly known as:  PERCOCET/ROXICET   tiZANidine 2 MG tablet Commonly known as:  ZANAFLEX     TAKE these medications   amLODipine 10 MG tablet Commonly known as:  NORVASC Take 1 tablet (10 mg total) by mouth daily.   aspirin EC 81 MG tablet Take 1 tablet (81 mg total) by mouth 2 (two) times daily.   cetirizine 10 MG tablet Commonly known as:  ZYRTEC Take 1 tablet (10 mg total) by mouth daily.   cholecalciferol 25 MCG (1000 UT) tablet Commonly known as:  VITAMIN D Take 1,000 Units by mouth daily.   fluticasone 50 MCG/ACT nasal spray Commonly known as:  FLONASE Place 2 sprays into both nostrils daily as needed for allergies or rhinitis. What changed:  when to take this   lidocaine 5 % Commonly known as:  LIDODERM Place 1 patch onto the skin daily. Remove & Discard patch within 12 hours or as directed by MD   metFORMIN 1000 MG tablet Commonly known as:  GLUCOPHAGE TAKE 1 TABLET (1,000 MG TOTAL) BY MOUTH 2 (TWO) TIMES DAILY WITH A MEAL.   omeprazole 40 MG capsule Commonly known as:  PRILOSEC Take 1 capsule (40 mg total) by mouth 2 (two) times daily. What changed:  when to take this   sucralfate 1 GM/10ML suspension Commonly known as:  CARAFATE Take  10 mLs (1 g total) by mouth 4 (four) times daily -  with meals and at bedtime.       Discharge Instructions: Please refer to Patient Instructions section of EMR for full details.  Patient was counseled important signs and symptoms that should prompt return to medical care, changes in medications, dietary instructions, activity restrictions, and follow up appointments.   Follow-Up Appointments: Follow-up Information    [place] Go on 11/13/2018.   Why:  At 8:55am, for hospital follow up.  Contact information: [address] [place]       Carol Ada, MD. Schedule an appointment as soon as possible for a visit.    Specialty:  Gastroenterology Why:  Please make an appointment to see your GI doctor, Dr. Benson Norway, in the next 1-2 weeks for abdominal pain follow up.  Contact information: [place], [place] [id number] [place], [place], DO 11/12/2018, 2:52 PM PGY-1, [place]

## 2018-11-08 ENCOUNTER — Other Ambulatory Visit: Payer: Self-pay

## 2018-11-08 DIAGNOSIS — K219 Gastro-esophageal reflux disease without esophagitis: Secondary | ICD-10-CM | POA: Diagnosis present

## 2018-11-08 DIAGNOSIS — M549 Dorsalgia, unspecified: Secondary | ICD-10-CM | POA: Diagnosis present

## 2018-11-08 DIAGNOSIS — K838 Other specified diseases of biliary tract: Principal | ICD-10-CM

## 2018-11-08 DIAGNOSIS — K3189 Other diseases of stomach and duodenum: Secondary | ICD-10-CM | POA: Diagnosis not present

## 2018-11-08 DIAGNOSIS — Z7982 Long term (current) use of aspirin: Secondary | ICD-10-CM | POA: Diagnosis not present

## 2018-11-08 DIAGNOSIS — K2950 Unspecified chronic gastritis without bleeding: Secondary | ICD-10-CM | POA: Diagnosis not present

## 2018-11-08 DIAGNOSIS — Z96651 Presence of right artificial knee joint: Secondary | ICD-10-CM | POA: Diagnosis present

## 2018-11-08 DIAGNOSIS — R748 Abnormal levels of other serum enzymes: Secondary | ICD-10-CM | POA: Diagnosis not present

## 2018-11-08 DIAGNOSIS — Z791 Long term (current) use of non-steroidal anti-inflammatories (NSAID): Secondary | ICD-10-CM | POA: Diagnosis not present

## 2018-11-08 DIAGNOSIS — K2990 Gastroduodenitis, unspecified, without bleeding: Secondary | ICD-10-CM | POA: Diagnosis not present

## 2018-11-08 DIAGNOSIS — K5900 Constipation, unspecified: Secondary | ICD-10-CM | POA: Diagnosis not present

## 2018-11-08 DIAGNOSIS — R932 Abnormal findings on diagnostic imaging of liver and biliary tract: Secondary | ICD-10-CM | POA: Diagnosis not present

## 2018-11-08 DIAGNOSIS — E669 Obesity, unspecified: Secondary | ICD-10-CM | POA: Diagnosis present

## 2018-11-08 DIAGNOSIS — M1990 Unspecified osteoarthritis, unspecified site: Secondary | ICD-10-CM | POA: Diagnosis present

## 2018-11-08 DIAGNOSIS — Z6829 Body mass index (BMI) 29.0-29.9, adult: Secondary | ICD-10-CM | POA: Diagnosis not present

## 2018-11-08 DIAGNOSIS — R1011 Right upper quadrant pain: Secondary | ICD-10-CM | POA: Diagnosis not present

## 2018-11-08 DIAGNOSIS — K5730 Diverticulosis of large intestine without perforation or abscess without bleeding: Secondary | ICD-10-CM | POA: Diagnosis not present

## 2018-11-08 DIAGNOSIS — Z7951 Long term (current) use of inhaled steroids: Secondary | ICD-10-CM | POA: Diagnosis not present

## 2018-11-08 DIAGNOSIS — R350 Frequency of micturition: Secondary | ICD-10-CM | POA: Diagnosis present

## 2018-11-08 DIAGNOSIS — G8929 Other chronic pain: Secondary | ICD-10-CM | POA: Diagnosis present

## 2018-11-08 DIAGNOSIS — Z79899 Other long term (current) drug therapy: Secondary | ICD-10-CM | POA: Diagnosis not present

## 2018-11-08 DIAGNOSIS — N401 Enlarged prostate with lower urinary tract symptoms: Secondary | ICD-10-CM | POA: Diagnosis present

## 2018-11-08 DIAGNOSIS — K2970 Gastritis, unspecified, without bleeding: Secondary | ICD-10-CM | POA: Diagnosis not present

## 2018-11-08 DIAGNOSIS — Z981 Arthrodesis status: Secondary | ICD-10-CM | POA: Diagnosis not present

## 2018-11-08 DIAGNOSIS — E785 Hyperlipidemia, unspecified: Secondary | ICD-10-CM | POA: Diagnosis present

## 2018-11-08 DIAGNOSIS — I10 Essential (primary) hypertension: Secondary | ICD-10-CM | POA: Diagnosis not present

## 2018-11-08 DIAGNOSIS — D649 Anemia, unspecified: Secondary | ICD-10-CM | POA: Diagnosis present

## 2018-11-08 DIAGNOSIS — F329 Major depressive disorder, single episode, unspecified: Secondary | ICD-10-CM | POA: Diagnosis present

## 2018-11-08 DIAGNOSIS — E119 Type 2 diabetes mellitus without complications: Secondary | ICD-10-CM | POA: Diagnosis not present

## 2018-11-08 DIAGNOSIS — F17210 Nicotine dependence, cigarettes, uncomplicated: Secondary | ICD-10-CM | POA: Diagnosis present

## 2018-11-08 DIAGNOSIS — K228 Other specified diseases of esophagus: Secondary | ICD-10-CM | POA: Diagnosis not present

## 2018-11-08 DIAGNOSIS — Z7984 Long term (current) use of oral hypoglycemic drugs: Secondary | ICD-10-CM | POA: Diagnosis not present

## 2018-11-08 LAB — COMPREHENSIVE METABOLIC PANEL
ALT: 55 U/L — ABNORMAL HIGH (ref 0–44)
ANION GAP: 8 (ref 5–15)
AST: 70 U/L — ABNORMAL HIGH (ref 15–41)
Albumin: 3.5 g/dL (ref 3.5–5.0)
Alkaline Phosphatase: 79 U/L (ref 38–126)
BUN: 5 mg/dL — ABNORMAL LOW (ref 8–23)
CO2: 26 mmol/L (ref 22–32)
Calcium: 9 mg/dL (ref 8.9–10.3)
Chloride: 103 mmol/L (ref 98–111)
Creatinine, Ser: 0.92 mg/dL (ref 0.61–1.24)
GFR calc Af Amer: >60 mL/min (ref >60)
GFR calc non Af Amer: >60 mL/min (ref >60)
Glucose, Bld: 124 mg/dL — ABNORMAL HIGH (ref 70–99)
Potassium: 3.8 mmol/L (ref 3.5–5.1)
Sodium: 137 mmol/L (ref 135–145)
Total Bilirubin: 0.7 mg/dL (ref 0.3–1.2)
Total Protein: 6.3 g/dL — ABNORMAL LOW (ref 6.5–8.1)

## 2018-11-08 LAB — GLUCOSE, CAPILLARY
Glucose-Capillary: 104 mg/dL — ABNORMAL HIGH (ref 70–99)
Glucose-Capillary: 109 mg/dL — ABNORMAL HIGH (ref 70–99)
Glucose-Capillary: 128 mg/dL — ABNORMAL HIGH (ref 70–99)
Glucose-Capillary: 133 mg/dL — ABNORMAL HIGH (ref 70–99)
Glucose-Capillary: 186 mg/dL — ABNORMAL HIGH (ref 70–99)
Glucose-Capillary: 456 mg/dL — ABNORMAL HIGH (ref 70–99)

## 2018-11-08 LAB — CBC
HEMATOCRIT: 38 % — AB (ref 39.0–52.0)
Hemoglobin: 12.3 g/dL — ABNORMAL LOW (ref 13.0–17.0)
MCH: 26.1 pg (ref 26.0–34.0)
MCHC: 32.4 g/dL (ref 30.0–36.0)
MCV: 80.7 fL (ref 80.0–100.0)
Platelets: 268 10*3/uL (ref 150–400)
RBC: 4.71 MIL/uL (ref 4.22–5.81)
RDW: 14.4 % (ref 11.5–15.5)
WBC: 3.9 10*3/uL — AB (ref 4.0–10.5)
nRBC: 0 % (ref 0.0–0.2)

## 2018-11-08 MED ORDER — INSULIN ASPART 100 UNIT/ML ~~LOC~~ SOLN
10.0000 [IU] | Freq: Once | SUBCUTANEOUS | Status: AC
  Administered 2018-11-08: 10 [IU] via SUBCUTANEOUS

## 2018-11-08 MED ORDER — MORPHINE SULFATE (PF) 2 MG/ML IV SOLN
2.0000 mg | INTRAVENOUS | Status: DC | PRN
  Administered 2018-11-08 – 2018-11-09 (×4): 2 mg via INTRAVENOUS
  Filled 2018-11-08 (×4): qty 1

## 2018-11-08 MED ORDER — HEPARIN SODIUM (PORCINE) 5000 UNIT/ML IJ SOLN
5000.0000 [IU] | Freq: Three times a day (TID) | INTRAMUSCULAR | Status: DC
  Administered 2018-11-09 – 2018-11-10 (×3): 5000 [IU] via SUBCUTANEOUS
  Filled 2018-11-08 (×3): qty 1

## 2018-11-08 NOTE — Progress Notes (Signed)
Pt arrived from ED without distress. A&Ox4. IV right AC SL. NPO upon arrival.
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
## 2018-11-09 ENCOUNTER — Encounter (HOSPITAL_COMMUNITY): Payer: Self-pay | Admitting: *Deleted

## 2018-11-09 ENCOUNTER — Inpatient Hospital Stay (HOSPITAL_COMMUNITY): Payer: Medicare Other | Admitting: Certified Registered"

## 2018-11-09 ENCOUNTER — Encounter (HOSPITAL_COMMUNITY)
Admission: EM | Disposition: A | Discharge status: Home / Self Care | Payer: Self-pay | Source: Home / Self Care | Attending: Family Medicine

## 2018-11-09 DIAGNOSIS — K5900 Constipation, unspecified: Secondary | ICD-10-CM

## 2018-11-09 DIAGNOSIS — K838 Other specified diseases of biliary tract: Secondary | ICD-10-CM

## 2018-11-09 HISTORY — PX: BIOPSY: SHX5522

## 2018-11-09 HISTORY — PX: UPPER ESOPHAGEAL ENDOSCOPIC ULTRASOUND (EUS): SHX6562

## 2018-11-09 HISTORY — PX: ESOPHAGOGASTRODUODENOSCOPY (EGD) WITH PROPOFOL: SHX5813

## 2018-11-09 LAB — CBC WITH DIFFERENTIAL/PLATELET
Abs Immature Granulocytes: 0.01 10*3/uL (ref 0.00–0.07)
Basophils Absolute: 0 10*3/uL (ref 0.0–0.1)
Basophils Relative: 1 %
Eosinophils Absolute: 0.2 10*3/uL (ref 0.0–0.5)
Eosinophils Relative: 5 %
HCT: 37 % — ABNORMAL LOW (ref 39.0–52.0)
HEMOGLOBIN: 12 g/dL — AB (ref 13.0–17.0)
Immature Granulocytes: 0 %
Lymphocytes Relative: 38 %
Lymphs Abs: 1.6 10*3/uL (ref 0.7–4.0)
MCH: 26.2 pg (ref 26.0–34.0)
MCHC: 32.4 g/dL (ref 30.0–36.0)
MCV: 80.8 fL (ref 80.0–100.0)
Monocytes Absolute: 0.4 10*3/uL (ref 0.1–1.0)
Monocytes Relative: 9 %
Neutro Abs: 1.9 10*3/uL (ref 1.7–7.7)
Neutrophils Relative %: 47 %
Platelets: 269 10*3/uL (ref 150–400)
RBC: 4.58 MIL/uL (ref 4.22–5.81)
RDW: 14.2 % (ref 11.5–15.5)
WBC: 4 10*3/uL (ref 4.0–10.5)
nRBC: 0 % (ref 0.0–0.2)

## 2018-11-09 LAB — BASIC METABOLIC PANEL
Anion gap: 10 (ref 5–15)
BUN: 9 mg/dL (ref 8–23)
CO2: 27 mmol/L (ref 22–32)
Calcium: 9 mg/dL (ref 8.9–10.3)
Chloride: 101 mmol/L (ref 98–111)
Creatinine, Ser: 1.06 mg/dL (ref 0.61–1.24)
GFR calc Af Amer: >60 mL/min (ref >60)
GFR calc non Af Amer: >60 mL/min (ref >60)
Glucose, Bld: 124 mg/dL — ABNORMAL HIGH (ref 70–99)
Potassium: 3.9 mmol/L (ref 3.5–5.1)
Sodium: 138 mmol/L (ref 135–145)

## 2018-11-09 LAB — HEPATIC FUNCTION PANEL
ALT: 84 U/L — AB (ref 0–44)
AST: 75 U/L — ABNORMAL HIGH (ref 15–41)
Albumin: 3.6 g/dL (ref 3.5–5.0)
Alkaline Phosphatase: 87 U/L (ref 38–126)
Bilirubin, Direct: <0.1 mg/dL (ref 0.0–0.2)
Total Bilirubin: 0.6 mg/dL (ref 0.3–1.2)
Total Protein: 6.9 g/dL (ref 6.5–8.1)

## 2018-11-09 LAB — HEPATITIS PANEL, ACUTE
HCV Ab: <0.1 s/co ratio (ref 0.0–0.9)
Hep A IgM: NEGATIVE
Hep B C IgM: NEGATIVE
Hepatitis B Surface Ag: NEGATIVE

## 2018-11-09 LAB — GLUCOSE, CAPILLARY
GLUCOSE-CAPILLARY: 104 mg/dL — AB (ref 70–99)
GLUCOSE-CAPILLARY: 95 mg/dL (ref 70–99)
Glucose-Capillary: 125 mg/dL — ABNORMAL HIGH (ref 70–99)
Glucose-Capillary: 106 mg/dL — ABNORMAL HIGH (ref 70–99)
Glucose-Capillary: 182 mg/dL — ABNORMAL HIGH (ref 70–99)
Glucose-Capillary: 131 mg/dL — ABNORMAL HIGH (ref 70–99)
Glucose-Capillary: 139 mg/dL — ABNORMAL HIGH (ref 70–99)
Glucose-Capillary: 178 mg/dL — ABNORMAL HIGH (ref 70–99)

## 2018-11-09 LAB — LIPASE, BLOOD: Lipase: 42 U/L (ref 11–51)

## 2018-11-09 SURGERY — UPPER ESOPHAGEAL ENDOSCOPIC ULTRASOUND (EUS)
Anesthesia: Monitor Anesthesia Care

## 2018-11-09 MED ORDER — MORPHINE SULFATE (PF) 2 MG/ML IV SOLN
1.0000 mg | INTRAVENOUS | Status: DC | PRN
  Administered 2018-11-09 (×2): 1 mg via INTRAVENOUS
  Filled 2018-11-09 (×2): qty 1

## 2018-11-09 MED ORDER — ONDANSETRON HCL 4 MG/2ML IJ SOLN: 4.0000 mg | Freq: Four times a day (QID) | INTRAMUSCULAR | Status: DC | PRN

## 2018-11-09 MED ORDER — OXYCODONE HCL 5 MG PO TABS: 5.0000 mg | ORAL_TABLET | Freq: Once | ORAL | Status: DC | PRN

## 2018-11-09 MED ORDER — LACTATED RINGERS IV SOLN
INTRAVENOUS | Status: DC
  Administered 2018-11-09: 1000 mL via INTRAVENOUS

## 2018-11-09 MED ORDER — INDOMETHACIN 50 MG RE SUPP
RECTAL | Status: AC
  Filled 2018-11-09: qty 2

## 2018-11-09 MED ORDER — IOPAMIDOL (ISOVUE-300) INJECTION 61%
INTRAVENOUS | Status: AC
  Filled 2018-11-09: qty 50

## 2018-11-09 MED ORDER — CIPROFLOXACIN IN D5W 400 MG/200ML IV SOLN
INTRAVENOUS | Status: AC
  Filled 2018-11-09: qty 200

## 2018-11-09 MED ORDER — PROPOFOL 500 MG/50ML IV EMUL
INTRAVENOUS | Status: DC | PRN
  Administered 2018-11-09: 50 ug/kg/min via INTRAVENOUS

## 2018-11-09 MED ORDER — LIDOCAINE 5 % EX PTCH
1.0000 | MEDICATED_PATCH | CUTANEOUS | Status: DC
  Administered 2018-11-09 – 2018-11-10 (×2): 1 via TRANSDERMAL
  Filled 2018-11-09 (×3): qty 1

## 2018-11-09 MED ORDER — POLYETHYLENE GLYCOL 3350 17 G PO PACK
17.0000 g | PACK | Freq: Every day | ORAL | Status: DC
  Administered 2018-11-09 – 2018-11-10 (×2): 17 g via ORAL
  Filled 2018-11-09 (×2): qty 1

## 2018-11-09 MED ORDER — SUCRALFATE 1 GM/10ML PO SUSP
1.0000 g | Freq: Three times a day (TID) | ORAL | Status: DC
  Administered 2018-11-09 – 2018-11-10 (×5): 1 g via ORAL
  Filled 2018-11-09 (×5): qty 10

## 2018-11-09 MED ORDER — GLUCAGON HCL RDNA (DIAGNOSTIC) 1 MG IJ SOLR
INTRAMUSCULAR | Status: AC
  Filled 2018-11-09: qty 1

## 2018-11-09 MED ORDER — GLYCOPYRROLATE PF 0.2 MG/ML IJ SOSY
PREFILLED_SYRINGE | INTRAMUSCULAR | Status: DC | PRN
  Administered 2018-11-09: .2 mg via INTRAVENOUS

## 2018-11-09 MED ORDER — FENTANYL CITRATE (PF) 100 MCG/2ML IJ SOLN: 25.0000 ug | INTRAMUSCULAR | Status: DC | PRN

## 2018-11-09 MED ORDER — OXYCODONE HCL 5 MG/5ML PO SOLN: 5.0000 mg | Freq: Once | ORAL | Status: DC | PRN

## 2018-11-09 MED ORDER — PANTOPRAZOLE SODIUM 40 MG PO TBEC
40.0000 mg | DELAYED_RELEASE_TABLET | Freq: Two times a day (BID) | ORAL | Status: DC
  Administered 2018-11-09 – 2018-11-10 (×3): 40 mg via ORAL
  Filled 2018-11-09 (×3): qty 1

## 2018-11-09 NOTE — Transfer of Care (Signed)
Immediate Anesthesia Transfer of Care Note  Patient: Brian Dominguez  Procedure(s) Performed: UPPER ESOPHAGEAL ENDOSCOPIC ULTRASOUND (EUS) (N/A ) ESOPHAGOGASTRODUODENOSCOPY (EGD) WITH PROPOFOL (N/A ) BIOPSY  Patient Location: PACU  Anesthesia Type:MAC  Level of Consciousness: awake, alert , oriented and patient cooperative  Airway & Oxygen Therapy: Patient Spontanous Breathing  Post-op Assessment: Report given to RN and Post -op Vital signs reviewed and stable  Post vital signs: Reviewed and stable  Last Vitals:  Vitals Value Taken Time  BP 140/71 11/09/2018  8:18 AM  Temp    Pulse 93 11/09/2018  8:19 AM  Resp 22 11/09/2018  8:19 AM  SpO2 100 % 11/09/2018  8:19 AM  Vitals shown include unvalidated device data.  Last Pain:  Vitals:   11/09/18 0703  TempSrc: Oral  PainSc: 3       Patients Stated Pain Goal: 0 (32/99/24 2683)  Complications: No apparent anesthesia complications

## 2018-11-09 NOTE — Progress Notes (Signed)
Refer to 04:23

## 2018-11-09 NOTE — Anesthesia Preprocedure Evaluation (Signed)
Anesthesia Evaluation  Patient identified by MRN, date of birth, ID band Patient awake    Reviewed: Allergy & Precautions, H&P , NPO status , Patient's Chart, lab work & pertinent test results  Airway Mallampati: II   Neck ROM: full    Dental   Pulmonary Current Smoker,    breath sounds clear to auscultation       Cardiovascular hypertension,  Rhythm:regular Rate:Normal     Neuro/Psych PSYCHIATRIC DISORDERS Anxiety Depression    GI/Hepatic GERD  ,  Endo/Other  diabetes, Type 2  Renal/GU      Musculoskeletal  (+) Arthritis ,   Abdominal   Peds  Hematology   Anesthesia Other Findings   Reproductive/Obstetrics                             Anesthesia Physical Anesthesia Plan  ASA: III  Anesthesia Plan: MAC   Post-op Pain Management:    Induction: Intravenous  PONV Risk Score and Plan: 0 and Propofol infusion, Treatment may vary due to age or medical condition and Ondansetron  Airway Management Planned: Nasal Cannula  Additional Equipment:   Intra-op Plan:   Post-operative Plan:   Informed Consent: I have reviewed the patients History and Physical, chart, labs and discussed the procedure including the risks, benefits and alternatives for the proposed anesthesia with the patient or authorized representative who has indicated his/her understanding and acceptance.     Plan Discussed with: CRNA, Anesthesiologist and Surgeon  Anesthesia Plan Comments:         Anesthesia Quick Evaluation

## 2018-11-09 NOTE — Progress Notes (Signed)
Patient to EUS.

## 2018-11-09 NOTE — Op Note (Signed)
[facility] Patient Name: Brian Dominguez Procedure Date : 11/09/2018 MRN: [id number] Attending MD: Justice Britain , MD Date of Birth: 12-13-1945 CSN: [id number] Age: 72 Admit Type: Inpatient Procedure:                Upper EUS Indications:              Common bile duct dilation (etiology unknown) seen                            on CT scan, Abnormal abdominal MRI, Elevated                            aspartate transaminase (AST), Elevated alanine                            transaminase (ALT) Providers:                Justice Britain, MD, Elmer Ramp. Tilden Dome, RN, Charolette Child, Technician Referring MD:             [facility] Teaching Service, Carol Ada Medicines:                Monitored Anesthesia Care Complications:            No immediate complications. Estimated Blood Loss:     Estimated blood loss was minimal. Procedure:                Pre-Anesthesia Assessment:                           - Prior to the procedure, a History and Physical                            was performed, and patient medications and                            allergies were reviewed. The patient's tolerance of                            previous anesthesia was also reviewed. The risks                            and benefits of the procedure and the sedation                            options and risks were discussed with the patient.                            All questions were answered, and informed consent                            was obtained. Prior Anticoagulants: The patient has  taken no previous anticoagulant or antiplatelet                            agents. ASA Grade Assessment: III - A patient with                            severe systemic disease. After reviewing the risks                            and benefits, the patient was deemed in                            satisfactory condition to undergo the procedure.         After obtaining informed consent, the endoscope was                            passed under direct vision. Throughout the                            procedure, the patient's blood pressure, pulse, and                            oxygen saturations were monitored continuously. The                            [id number]-H190 ([id number]) Olympus Adult EGD was introduced                            through the mouth, and advanced to the second part                            of duodenum. The [id number]-[id number] ([id number]) [place] was introduced through the mouth, and                            advanced to the second part of duodenum. The                            GF-[id number] ([id number]) Olympus Linear EUS was                            introduced through the mouth, and advanced to the                            duodenum for ultrasound examination from the                            stomach and duodenum. The upper EUS was                            accomplished without difficulty. The patient  tolerated the procedure. Scope In: Scope Out: Findings:      ENDOSCOPIC FINDING: :      No gross lesions were noted in the entire esophagus.      The Z-line was irregular and was found 42 cm from the incisors.      A few dispersed, small non-bleeding erosions were found in the gastric       antrum. There were no stigmata of recent bleeding.      No other gross lesions were noted in the entire examined stomach.       Biopsies were taken with a cold forceps for histology and Helicobacter       pylori testing from the antrum/incisura/greater curve/lesser curve.      No gross lesions were noted in the duodenal bulb, in the first portion       of the duodenum, in the second portion of the duodenum and in the major       papilla.      ENDOSONOGRAPHIC FINDING: :      Endosonographic imaging in the common bile duct (3.0 mm -> 5.1 mm -> 5.6       mm) and in the  common hepatic duct (7.0 mm) showed no stones or sludge.      A small amount of isoechoic material consistent with possible sludge was       visualized endosonographically in the cystic duct remnant. It is not       shadowing or hyperechoic as would normally be the case of       choledocholithiasis or sludge.      Endosonographic imaging in the entire pancreas showed no parenchymal       abnormalities.      The pancreatic duct had a normal endosonographic appearance in the       pancreatic head (1.5 mm), genu of the pancreas (1.3 mm), body of the       pancreas (1.5 mm) and tail of the pancreas (0.6 mm).      Endosonographic imaging of the ampulla showed no intramural       (subepithelial) lesion.      Endosonographic imaging in the visualized portion of the liver showed no       mass.      The celiac region was visualized. Impression:               EGD Impression:                           - No gross lesions in esophagus. Z-line irregular,                            42 cm from the incisors.                           - Non-bleeding erosive gastropathy. Otherwise, no                            gross lesions in the stomach. Biopsied for HP.                           - No gross lesions in the duodenal bulb, in the  first portion of the duodenum, in the second                            portion of the duodenum and in the major papilla.                           EUS Impression:                           - Isoechoic material consistent with possible                            sludge was visualized endosonographically in the                            cystic duct remnant. There was no evidence of                            sludge/debris/stone material within the entire CBD                            & CHD region.                           - The pancreatic duct had a normal endosonographic                            appearance in the pancreatic head, genu of the                             pancreas, body of the pancreas and tail of the                            pancreas. No parenchymal abnormalities noted.                           - No ampullary lesion noted. Recommendation:           - The patient will be observed post-procedure,                            until all discharge criteria are met.                           - Return patient to hospital ward for ongoing care.                           - Check liver enzymes (AST, ALT, alkaline                            phosphatase, bilirubin) in the morning.                           - Observe patient's clinical course.                           -  Resume previous diet.                           - Increase PPI to BID dosing.                           - Await path results.                           - I will discuss his case and EUS findings with Dr.                            Benson Norway (primary gastroenterologist). His cystic duct                            take off shows evidence of isoechoic material that                            could be sludge/debris. However, the cystic duct                            itself is high and not dilated significantly on EUS                            or MRI imaging. In theory, patient could                            potentially be having sludge/debris drop into his                            CBD and cause intermittent obstructive-like                            pictures and cause LFT abnormalities, however,                            going against this, is his transaminases are                            elevated currently and there is no debris within                            the entire CBD/CHD region. It is not clear if a                            sphincterotomy to allow things to flow should this                            occur is worthwhile to pursue but I do think that                            further discussion and monitoring of LFTs should be  performed. Functional disorder of the sphincter                            could be considered if he continues to have                            recurrent LFT abnormalities with pain/discomfort in                            the region and a persistently dilated duct, however                            the risk of Post-ERCP pancreatitis in those                            patients can be as high as 20-25% with                            sphincterotomy. A liver biopsy could also be                            considered in the future (but not as an inpatient                            currently).                           - The findings and recommendations were discussed                            with the patient.                           - The findings and recommendations were discussed                            with the referring physician. Procedure Code(s):        --- Professional ---                           [phone], Esophagogastroduodenoscopy, flexible,                            transoral; with endoscopic ultrasound examination                            limited to the esophagus, stomach or duodenum, and                            adjacent structures Diagnosis Code(s):        --- Professional ---                           K22.8, Other specified diseases of esophagus  K31.89, Other diseases of stomach and duodenum                           R74.0, Nonspecific elevation of levels of                            transaminase and lactic acid dehydrogenase [LDH]                           K83.8, Other specified diseases of biliary tract                           R93.5, Abnormal findings on diagnostic imaging of                            other abdominal regions, including retroperitoneum                           R93.2, Abnormal findings on diagnostic imaging of                            liver and biliary tract CPT copyright 2018 [employer].  All rights reserved. The codes documented in this report are preliminary and upon coder review may  be revised to meet current compliance requirements. Justice Britain, MD 11/09/2018 9:19:27 AM Number of Addenda: 0
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
## 2018-11-09 NOTE — Anesthesia Postprocedure Evaluation (Signed)
Anesthesia Post Note  Patient: Brian Dominguez  Procedure(s) Performed: UPPER ESOPHAGEAL ENDOSCOPIC ULTRASOUND (EUS) (N/A ) ESOPHAGOGASTRODUODENOSCOPY (EGD) WITH PROPOFOL (N/A ) BIOPSY     Patient location during evaluation: Endoscopy Anesthesia Type: MAC Level of consciousness: awake and alert Pain management: pain level controlled Vital Signs Assessment: post-procedure vital signs reviewed and stable Respiratory status: spontaneous breathing, nonlabored ventilation, respiratory function stable and patient connected to nasal cannula oxygen Cardiovascular status: blood pressure returned to baseline and stable Postop Assessment: no apparent nausea or vomiting Anesthetic complications: no    Last Vitals:  Vitals:   11/09/18 0902 11/09/18 1208  BP: 131/70 (!) 148/73  Pulse: 75 88  Resp: 18 16  Temp: 36.9 C   SpO2: 100% 100%    Last Pain:  Vitals:   11/09/18 0902  TempSrc: Oral  PainSc:                  [name]

## 2018-11-09 NOTE — Progress Notes (Signed)
Family Medicine Teaching Service Daily Progress Note Intern Pager: [phone]  Patient name: Brian Dominguez Medical record number: [id number] Date of birth: [birth year] Age: 72 y.o. Gender: male  Primary Care Provider: Everrett Coombe, MD Consultants: GI  Code Status: Full   Assessment and Plan: Brian Dominguez is a 72 y.o. male presenting with RUQ abdominal pain. PMH is significant for HTN, T2DM, H. Pylori infection, Obesity, MDD, Osteoarthritis, HLD, and GERD.  RUQ Abdominal Pain: Acute, improving.   AST/ALT continuing trending up to 75/84 from 16/15 on admit 11/29, however alk phos and bilirubin wnl. Acute hepatitis panel negative. EUS performed by GI today, 12/1, showing no choledocholithiasis or sludge within the CBD, however did note some potential sludge near the cystic duct remnant and small erosions in the gastric antrum. Recommending to increase PPI to BID.  -GI on board, Dr. Benson Norway to see tomorrow  -Vitals per routine -Increase PPI to BID -Tylenol PRN mild pain, d/c IV morphine  -Start Carafate TID  -CBC, CMP in am  -F/u h pylori biopsy  -d/c IVF, encourage frequent small sips of fluids   T2DM: Chronic, stable.  A1c 7.9%. CBG average 140, outlier 456 overnight.  -sSSI -CBGs -Holding home metformin 1000 BID   Normocytic Anemia: Stable.  At baseline.  -CBC   HTN: Chronic, stable.  SBP ranging 120-140's.  -Continue home amlodipine 10 mg daily -Monitor BP's   Allergies: Stable.  -Continue Flonase and Loratadine daily   Recent L Knee Replacement: Stable  Surgery on 09/29/2018 -Monitor pain -Stop meloxicam and Tizanidine while inpatient  Chronic back pain: Stable.  Stable, endorsing continued back pain.  -Start lidocaine patch   BPH: Chronic, stable.  Output appropriate.  -Continue home Flomax as needed  FEN/GI: carb modified   Prophylaxis: Heparin  Disposition: Continued medical care, likely d/c tomorrow if improvement of labs, GI eval   Subjective:   Doing well this morning post procedure, but continuing to have abdominal pain. Patient is wanting to stay through tomorrow to further watch his labs and monitor symptoms.   Objective: Temp:  [98.1 F (36.7 C)-99.1 F (37.3 C)] 98.4 F (36.9 C) (12/01 0703) Pulse Rate:  [80-84] 80 (12/01 0703) Resp:  [17-20] 20 (12/01 0703) BP: (122-145)/(70-107) 145/74 (12/01 0703) SpO2:  [99 %-100 %] 100 % (12/01 0703) Weight:  [108.3 kg] 108.3 kg (12/01 0703) Physical Exam: General: Alert, NAD HEENT: NCAT, MMM Cardiac: RRR no m/g/r Lungs: Clear bilaterally, no increased WOB  Abdomen: soft, non-tender, non-distended, normoactive BS, several healed abdomen scars Msk: Moves all extremities spontaneously, back without swelling, erythema, or pain with palpation   Ext: Warm, dry, 2+ distal pulses, no edema    Laboratory: Recent Labs  Lab 11/07/18 0947 11/08/18 0349 11/09/18 0510  WBC 5.7 3.9* 4.0  HGB 12.5* 12.3* 12.0*  HCT 40.3 38.0* 37.0*  PLT 303 268 269   Recent Labs  Lab 11/07/18 0947 11/08/18 0349 11/09/18 0510  NA 137 137 138  K 3.8 3.8 3.9  CL 104 103 101  CO2 27 26 27   BUN 6* 5* 9  CREATININE 0.97 0.92 1.06  CALCIUM 9.6 9.0 9.0  PROT 7.4 6.3* 6.9  BILITOT 0.6 0.7 0.6  ALKPHOS 57 79 87  ALT 15 55* 84*  AST 16 70* 75*  GLUCOSE 157* 124* 124*    Imaging/Diagnostic Tests: No results found.  Brian Clan, DO 11/09/2018, 7:51 AM PGY-1, [place] Intern pager: [phone], text pages welcome

## 2018-11-10 ENCOUNTER — Ambulatory Visit: Payer: Medicare Other

## 2018-11-10 DIAGNOSIS — K2990 Gastroduodenitis, unspecified, without bleeding: Secondary | ICD-10-CM

## 2018-11-10 DIAGNOSIS — K2970 Gastritis, unspecified, without bleeding: Secondary | ICD-10-CM

## 2018-11-10 LAB — CBC WITH DIFFERENTIAL/PLATELET
Abs Immature Granulocytes: 0.01 10*3/uL (ref 0.00–0.07)
Basophils Absolute: 0 10*3/uL (ref 0.0–0.1)
Basophils Relative: 1 %
EOS ABS: 0.2 10*3/uL (ref 0.0–0.5)
Eosinophils Relative: 6 %
HCT: 37.6 % — ABNORMAL LOW (ref 39.0–52.0)
Hemoglobin: 12 g/dL — ABNORMAL LOW (ref 13.0–17.0)
IMMATURE GRANULOCYTES: 0 %
Lymphocytes Relative: 34 %
Lymphs Abs: 1.4 10*3/uL (ref 0.7–4.0)
MCH: 25.5 pg — ABNORMAL LOW (ref 26.0–34.0)
MCHC: 31.9 g/dL (ref 30.0–36.0)
MCV: 80 fL (ref 80.0–100.0)
Monocytes Absolute: 0.4 10*3/uL (ref 0.1–1.0)
Monocytes Relative: 11 %
Neutro Abs: 2 10*3/uL (ref 1.7–7.7)
Neutrophils Relative %: 48 %
Platelets: 287 10*3/uL (ref 150–400)
RBC: 4.7 MIL/uL (ref 4.22–5.81)
RDW: 13.9 % (ref 11.5–15.5)
WBC: 4.2 10*3/uL (ref 4.0–10.5)
nRBC: 0 % (ref 0.0–0.2)

## 2018-11-10 LAB — COMPREHENSIVE METABOLIC PANEL
ALBUMIN: 3.7 g/dL (ref 3.5–5.0)
ALT: 56 U/L — ABNORMAL HIGH (ref 0–44)
AST: 37 U/L (ref 15–41)
Alkaline Phosphatase: 75 U/L (ref 38–126)
Anion gap: 4 — ABNORMAL LOW (ref 5–15)
BUN: 8 mg/dL (ref 8–23)
CO2: 30 mmol/L (ref 22–32)
Calcium: 9.2 mg/dL (ref 8.9–10.3)
Chloride: 104 mmol/L (ref 98–111)
Creatinine, Ser: 0.93 mg/dL (ref 0.61–1.24)
GFR calc Af Amer: >60 mL/min (ref >60)
GFR calc non Af Amer: >60 mL/min (ref >60)
Glucose, Bld: 165 mg/dL — ABNORMAL HIGH (ref 70–99)
Potassium: 4 mmol/L (ref 3.5–5.1)
Sodium: 138 mmol/L (ref 135–145)
Total Bilirubin: 0.4 mg/dL (ref 0.3–1.2)
Total Protein: 6.4 g/dL — ABNORMAL LOW (ref 6.5–8.1)

## 2018-11-10 LAB — GLUCOSE, CAPILLARY
Glucose-Capillary: 134 mg/dL — ABNORMAL HIGH (ref 70–99)
Glucose-Capillary: 137 mg/dL — ABNORMAL HIGH (ref 70–99)
Glucose-Capillary: 139 mg/dL — ABNORMAL HIGH (ref 70–99)
Glucose-Capillary: 146 mg/dL — ABNORMAL HIGH (ref 70–99)
Glucose-Capillary: 165 mg/dL — ABNORMAL HIGH (ref 70–99)

## 2018-11-10 MED ORDER — SUCRALFATE 1 GM/10ML PO SUSP: 1.0000 g | Freq: Three times a day (TID) | ORAL | 0 refills | Status: DC

## 2018-11-10 MED ORDER — OMEPRAZOLE 40 MG PO CPDR: 40.0000 mg | DELAYED_RELEASE_CAPSULE | Freq: Two times a day (BID) | ORAL | 0 refills | Status: DC

## 2018-11-10 MED ORDER — LIDOCAINE 5 % EX PTCH: 1.0000 | MEDICATED_PATCH | CUTANEOUS | 0 refills | Status: DC

## 2018-11-10 NOTE — Progress Notes (Signed)
Family Medicine Teaching Service Daily Progress Note Intern Pager: [phone]  Patient name: Brian Dominguez Medical record number: [id number] Date of birth: 04/15/1946 Age: 72 y.o. Gender: male  Primary Care Provider: Everrett Coombe, MD Consultants: GI  Code Status: Full   Assessment and Plan: Brian Dominguez is a 72 y.o. male presenting with RUQ abdominal pain. PMH is significant for HTN, T2DM, H. Pylori infection, Obesity, MDD, Osteoarthritis, HLD, and GERD.  RUQ Abdominal Pain: Acute, improving.  Continues to endorse some RUQ pain. AST/ALT trended down to 37/56 today from 75/84, alk phos and bilirubin wnl. Likely secondary to non-bleeding gastropathy present on EUS and especially as patient described clinical picture consistent with and has been using meloxicam for arthritis vs intermittment sludge appearing from hepatic/remaining biliary system after eating causing a temporary blockage with elevations in LFT's.   -GI on board, Dr. Benson Norway to see today, appreciate any additional recommendations  -Vitals per routine -PPI BID  -Carafate TID  -Tylenol PRN pain  -F/u CBC, CMP, H pylori biopsy  -Encourage small sips of fluids   T2DM: Chronic, stable.  A1c 7.9%. CBGs 130-180 overnight. Required 8 units yest.  -sSSI -CBGs -Holding home metformin 1000 BID   Normocytic Anemia: Stable.  At baseline around 12.  -CBC   HTN: Chronic, stable.  SBP ranging 120-140's.  -Continue home amlodipine 10 mg daily -Monitor BP's   Allergies: Stable.  -Continue Flonase and Loratadine daily   Recent L Knee Replacement: Stable  Surgery on 09/29/2018 -Monitor pain -Stop meloxicam and Tizanidine >> discuss topical therapies to avoid NSAID use at d/c   Chronic back pain: Stable.  Stable, lidocaine patch helped overnight.  -Cont lidocaine patch   BPH: Chronic, stable.  Output appropriate.  -Continue home Flomax as needed  FEN/GI: carb modified   Prophylaxis: Heparin  Disposition: likely  d/c today, pending GI   Subjective:  Doing well this morning, abdominal pain minimal. Able to eat and drink, however continues to have abdominal pain after eating.   Objective: Temp:  [98.7 F (37.1 C)] 98.7 F (37.1 C) (12/02 0447) Pulse Rate:  [85-88] 85 (12/02 0447) Resp:  [16-19] 18 (12/02 0447) BP: (128-148)/(70-79) 129/79 (12/02 0447) SpO2:  [98 %-100 %] 100 % (12/02 0447) Physical Exam: General: Alert, NAD HEENT: NCAT, MMM Cardiac: RRR no m/g/r Lungs: Clear bilaterally, no increased WOB  Abdomen: soft, non-tender, non-distended, normoactive BS Msk: Moves all extremities spontaneously  Ext: Warm, dry, 2+ distal pulses, no edema     Laboratory: Recent Labs  Lab 11/08/18 0349 11/09/18 0510 11/10/18 0211  WBC 3.9* 4.0 4.2  HGB 12.3* 12.0* 12.0*  HCT 38.0* 37.0* 37.6*  PLT 268 269 287   Recent Labs  Lab 11/08/18 0349 11/09/18 0510 11/10/18 0211  NA 137 138 138  K 3.8 3.9 4.0  CL 103 101 104  CO2 26 27 30   BUN 5* 9 8  CREATININE 0.92 1.06 0.93  CALCIUM 9.0 9.0 9.2  PROT 6.3* 6.9 6.4*  BILITOT 0.7 0.6 0.4  ALKPHOS 79 87 75  ALT 55* 84* 56*  AST 70* 75* 37  GLUCOSE 124* 124* 165*    Imaging/Diagnostic Tests: No results found.  Patriciaann Clan, DO 11/10/2018, 9:53 AM PGY-1, [place] Intern pager: [phone], text pages welcome

## 2018-11-10 NOTE — Discharge Instructions (Signed)
You were admitted to the hospital for abdominal pain. While you were here, the stomach (GI) doctors evaluated you and found some inflammation in your stomach, but thankfully no stone or obstructions near your liver that would cause pain. We have sent you home on a proton pump inhibitor (omeprazole) twice daily to help reduce acidity in your belly and help with symptoms, also sent you with carafate to help coat your stomach for additional relief. Please make sure you follow up with your GI doctor, Dr. Elnoria HowardHung, and [facility]

## 2018-11-10 NOTE — Progress Notes (Signed)
Patient discharged to home. Verbalizes understanding of all discharge instructions including discharge medications and follow up MD visits.  

## 2018-11-10 NOTE — Plan of Care (Signed)
  Problem: Education: Goal: Knowledge of General Education information will improve Description: Including pain rating scale, medication(s)/side effects and non-pharmacologic comfort measures Outcome: Progressing   Problem: Activity: Goal: Risk for activity intolerance will decrease Outcome: Progressing   

## 2018-11-11 NOTE — Consult Note (Signed)
            [facility]  11/11/2018  Brian JakesJohn A Arango 01/21/1946 [id number]   Attemptto see patient at the bedside to identify possible discharge needs but he was already discharged homeyesterday.  Per MD note, patient was admitted with right upper quadrant pain and CT of abdomen showed intra-and extrahepatic biliary ductal dilatation without visible obstruction. (RUQ and epigastric pain which correlates well with increased NSAID usage, right back and RUQ pain chronic, which had been worked- up for residual biliary tract disease since his cholecystectomy)   Patient has discharge instruction to follow-up with primary care provider follow-up on 11/13/18 and gastroenterology follow-up in 1-2 weeks after discharge.  Providers from primary care physicians' office had been following patient during this admission.  Primary care provider's office is listed as providing transition of care (TOC) follow-up.   For additional questions please contact:  Karin GoldenLorraine A. Chryl Holten, BSN, RN-BC [facility] PRIMARY CARE Navigator Cell: [phone]

## 2018-11-12 ENCOUNTER — Ambulatory Visit: Payer: Medicare Other | Attending: Orthopedic Surgery

## 2018-11-12 DIAGNOSIS — M6281 Muscle weakness (generalized): Secondary | ICD-10-CM | POA: Diagnosis not present

## 2018-11-12 DIAGNOSIS — M545 Low back pain: Secondary | ICD-10-CM | POA: Diagnosis not present

## 2018-11-12 DIAGNOSIS — Z96651 Presence of right artificial knee joint: Secondary | ICD-10-CM | POA: Insufficient documentation

## 2018-11-12 DIAGNOSIS — R600 Localized edema: Secondary | ICD-10-CM | POA: Diagnosis not present

## 2018-11-12 DIAGNOSIS — M25661 Stiffness of right knee, not elsewhere classified: Secondary | ICD-10-CM | POA: Diagnosis not present

## 2018-11-12 DIAGNOSIS — G8929 Other chronic pain: Secondary | ICD-10-CM | POA: Insufficient documentation

## 2018-11-12 DIAGNOSIS — M25561 Pain in right knee: Secondary | ICD-10-CM | POA: Insufficient documentation

## 2018-11-12 DIAGNOSIS — R262 Difficulty in walking, not elsewhere classified: Secondary | ICD-10-CM | POA: Insufficient documentation
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
## 2018-11-13 ENCOUNTER — Encounter: Payer: Self-pay | Admitting: Gastroenterology

## 2018-11-13 ENCOUNTER — Telehealth: Payer: Self-pay | Admitting: *Deleted

## 2018-11-13 ENCOUNTER — Inpatient Hospital Stay: Payer: Medicare Other | Admitting: Family Medicine

## 2018-11-13 NOTE — Telephone Encounter (Signed)
Pt states that is stomach is still hurting and he was told he had an ulcer.  States that he is taking the sulcrafate and omeprazole, but they are not helping.  He denies bloody stools, vomiting or fever.  No appts for today.  Made appt for the am but he wanted me to send a message to the MD as well to see if there is another medication to help in the meantime.    He also needs a refill on the sulcrafate. Chandani Rogowski, Maryjo RochesterJessica Dawn, CMA

## 2018-11-14 ENCOUNTER — Ambulatory Visit: Payer: Medicare Other

## 2018-11-14 ENCOUNTER — Encounter (HOSPITAL_COMMUNITY): Payer: Self-pay

## 2018-11-14 ENCOUNTER — Emergency Department (HOSPITAL_COMMUNITY)
Admission: EM | Admit: 2018-11-14 | Discharge: 2018-11-14 | Disposition: A | Discharge status: Home / Self Care | Payer: Medicare Other | Attending: Emergency Medicine | Admitting: Emergency Medicine

## 2018-11-14 ENCOUNTER — Other Ambulatory Visit: Payer: Self-pay

## 2018-11-14 DIAGNOSIS — Z96651 Presence of right artificial knee joint: Secondary | ICD-10-CM | POA: Insufficient documentation

## 2018-11-14 DIAGNOSIS — E119 Type 2 diabetes mellitus without complications: Secondary | ICD-10-CM | POA: Diagnosis not present

## 2018-11-14 DIAGNOSIS — I10 Essential (primary) hypertension: Secondary | ICD-10-CM | POA: Diagnosis not present

## 2018-11-14 DIAGNOSIS — Z96612 Presence of left artificial shoulder joint: Secondary | ICD-10-CM | POA: Diagnosis not present

## 2018-11-14 DIAGNOSIS — M9902 Segmental and somatic dysfunction of thoracic region: Secondary | ICD-10-CM | POA: Diagnosis not present

## 2018-11-14 DIAGNOSIS — R1013 Epigastric pain: Secondary | ICD-10-CM | POA: Diagnosis not present

## 2018-11-14 DIAGNOSIS — Z7982 Long term (current) use of aspirin: Secondary | ICD-10-CM | POA: Diagnosis not present

## 2018-11-14 DIAGNOSIS — Z7984 Long term (current) use of oral hypoglycemic drugs: Secondary | ICD-10-CM | POA: Diagnosis not present

## 2018-11-14 DIAGNOSIS — F17210 Nicotine dependence, cigarettes, uncomplicated: Secondary | ICD-10-CM | POA: Diagnosis not present

## 2018-11-14 DIAGNOSIS — M546 Pain in thoracic spine: Secondary | ICD-10-CM | POA: Diagnosis not present

## 2018-11-14 DIAGNOSIS — M47814 Spondylosis without myelopathy or radiculopathy, thoracic region: Secondary | ICD-10-CM | POA: Diagnosis not present

## 2018-11-14 LAB — URINALYSIS, ROUTINE W REFLEX MICROSCOPIC
Bilirubin Urine: NEGATIVE
Glucose, UA: NEGATIVE mg/dL
Hgb urine dipstick: NEGATIVE
Ketones, ur: NEGATIVE mg/dL
Leukocytes, UA: NEGATIVE
Nitrite: NEGATIVE
Protein, ur: NEGATIVE mg/dL
SPECIFIC GRAVITY, URINE: 1.024 (ref 1.005–1.030)
pH: 5 (ref 5.0–8.0)

## 2018-11-14 LAB — CBC
HCT: 39.6 % (ref 39.0–52.0)
Hemoglobin: 12.8 g/dL — ABNORMAL LOW (ref 13.0–17.0)
MCH: 26.2 pg (ref 26.0–34.0)
MCHC: 32.3 g/dL (ref 30.0–36.0)
MCV: 81 fL (ref 80.0–100.0)
Platelets: 326 10*3/uL (ref 150–400)
RBC: 4.89 MIL/uL (ref 4.22–5.81)
RDW: 14.5 % (ref 11.5–15.5)
WBC: 5.9 10*3/uL (ref 4.0–10.5)
nRBC: 0 % (ref 0.0–0.2)

## 2018-11-14 LAB — COMPREHENSIVE METABOLIC PANEL
ALK PHOS: 66 U/L (ref 38–126)
ALT: 25 U/L (ref 0–44)
AST: 20 U/L (ref 15–41)
Albumin: 4 g/dL (ref 3.5–5.0)
Anion gap: 13 (ref 5–15)
BUN: 10 mg/dL (ref 8–23)
CO2: 24 mmol/L (ref 22–32)
CREATININE: 1.01 mg/dL (ref 0.61–1.24)
Calcium: 9.6 mg/dL (ref 8.9–10.3)
Chloride: 102 mmol/L (ref 98–111)
GFR calc Af Amer: >60 mL/min (ref >60)
GFR calc non Af Amer: >60 mL/min (ref >60)
Glucose, Bld: 177 mg/dL — ABNORMAL HIGH (ref 70–99)
Potassium: 3.7 mmol/L (ref 3.5–5.1)
Sodium: 139 mmol/L (ref 135–145)
Total Bilirubin: 0.6 mg/dL (ref 0.3–1.2)
Total Protein: 7.3 g/dL (ref 6.5–8.1)

## 2018-11-14 LAB — LIPASE, BLOOD: Lipase: 58 U/L — ABNORMAL HIGH (ref 11–51)

## 2018-11-14 MED ORDER — SUCRALFATE 1 G PO TABS
1.0000 g | ORAL_TABLET | Freq: Once | ORAL | Status: AC
  Administered 2018-11-14: 1 g via ORAL
  Filled 2018-11-14: qty 1

## 2018-11-14 MED ORDER — SUCRALFATE 1 GM/10ML PO SUSP: 1.0000 g | Freq: Three times a day (TID) | ORAL | 0 refills | Status: DC

## 2018-11-14 MED ORDER — LIDOCAINE VISCOUS HCL 2 % MT SOLN
15.0000 mL | Freq: Once | OROMUCOSAL | Status: AC
  Administered 2018-11-14: 15 mL via ORAL
  Filled 2018-11-14: qty 15

## 2018-11-14 MED ORDER — ALUM & MAG HYDROXIDE-SIMETH 200-200-20 MG/5ML PO SUSP
30.0000 mL | Freq: Once | ORAL | Status: AC
  Administered 2018-11-14: 30 mL via ORAL
  Filled 2018-11-14: qty 30

## 2018-11-14 NOTE — ED Triage Notes (Signed)
Pt here for abdominal pain and bloating for the past week.  Seen here Friday for the same.  Stated meds that he received did not help and and pain is worse.  A&Ox4, ambulatory to triage.

## 2018-11-14 NOTE — ED Notes (Signed)
ED Provider at bedside. 
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
## 2018-11-14 NOTE — Telephone Encounter (Signed)
It seems the patient missed his hospital follow up (I think, based on how it appears in the chart?) and had to go to the ED for abdominal pain. In the interest of preventing rehospitalization can you please call and set up an ATC appointment for him to be seen this week whenever he can come? (The week of 12/9)

## 2018-11-14 NOTE — ED Notes (Signed)
Provider notified of patients request to know his test results

## 2018-11-17 ENCOUNTER — Encounter: Payer: Self-pay | Admitting: Physical Therapy

## 2018-11-17 ENCOUNTER — Ambulatory Visit: Payer: Medicare Other | Admitting: Physical Therapy

## 2018-11-17 DIAGNOSIS — M25561 Pain in right knee: Secondary | ICD-10-CM | POA: Diagnosis not present

## 2018-11-17 DIAGNOSIS — Z96651 Presence of right artificial knee joint: Secondary | ICD-10-CM | POA: Diagnosis not present

## 2018-11-17 DIAGNOSIS — M6281 Muscle weakness (generalized): Secondary | ICD-10-CM | POA: Diagnosis not present

## 2018-11-17 DIAGNOSIS — R600 Localized edema: Secondary | ICD-10-CM | POA: Diagnosis not present

## 2018-11-17 DIAGNOSIS — M25661 Stiffness of right knee, not elsewhere classified: Secondary | ICD-10-CM | POA: Diagnosis not present

## 2018-11-17 DIAGNOSIS — R262 Difficulty in walking, not elsewhere classified: Secondary | ICD-10-CM | POA: Diagnosis not present

## 2018-11-17 DIAGNOSIS — M545 Low back pain: Secondary | ICD-10-CM | POA: Diagnosis not present

## 2018-11-17 DIAGNOSIS — G8929 Other chronic pain: Secondary | ICD-10-CM | POA: Diagnosis not present

## 2018-11-17 NOTE — Telephone Encounter (Signed)
Pt has appointment scheduled for this on 11/21/2018 @11am . Lamonte SakaiZimmerman Rumple, April D, [place]
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
## 2018-11-18 DIAGNOSIS — M546 Pain in thoracic spine: Secondary | ICD-10-CM | POA: Diagnosis not present

## 2018-11-18 DIAGNOSIS — M47814 Spondylosis without myelopathy or radiculopathy, thoracic region: Secondary | ICD-10-CM | POA: Diagnosis not present

## 2018-11-18 DIAGNOSIS — Z96651 Presence of right artificial knee joint: Secondary | ICD-10-CM | POA: Diagnosis not present

## 2018-11-18 DIAGNOSIS — M545 Low back pain: Secondary | ICD-10-CM | POA: Diagnosis not present

## 2018-11-18 DIAGNOSIS — M9902 Segmental and somatic dysfunction of thoracic region: Secondary | ICD-10-CM | POA: Diagnosis not present

## 2018-11-18 DIAGNOSIS — M67911 Unspecified disorder of synovium and tendon, right shoulder: Secondary | ICD-10-CM | POA: Diagnosis not present

## 2018-11-18 DIAGNOSIS — M5441 Lumbago with sciatica, right side: Secondary | ICD-10-CM | POA: Diagnosis not present

## 2018-11-19 ENCOUNTER — Ambulatory Visit: Payer: Medicare Other

## 2018-11-19 DIAGNOSIS — G8929 Other chronic pain: Secondary | ICD-10-CM

## 2018-11-19 DIAGNOSIS — M25661 Stiffness of right knee, not elsewhere classified: Secondary | ICD-10-CM

## 2018-11-19 DIAGNOSIS — R600 Localized edema: Secondary | ICD-10-CM

## 2018-11-19 DIAGNOSIS — M6281 Muscle weakness (generalized): Secondary | ICD-10-CM

## 2018-11-19 DIAGNOSIS — M546 Pain in thoracic spine: Secondary | ICD-10-CM | POA: Diagnosis not present

## 2018-11-19 DIAGNOSIS — Z96651 Presence of right artificial knee joint: Secondary | ICD-10-CM

## 2018-11-19 DIAGNOSIS — M47814 Spondylosis without myelopathy or radiculopathy, thoracic region: Secondary | ICD-10-CM | POA: Diagnosis not present

## 2018-11-19 DIAGNOSIS — M9902 Segmental and somatic dysfunction of thoracic region: Secondary | ICD-10-CM | POA: Diagnosis not present

## 2018-11-19 DIAGNOSIS — M545 Low back pain, unspecified: Secondary | ICD-10-CM
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
## 2018-11-21 ENCOUNTER — Encounter: Payer: Self-pay | Admitting: Family Medicine

## 2018-11-21 ENCOUNTER — Ambulatory Visit (INDEPENDENT_AMBULATORY_CARE_PROVIDER_SITE_OTHER): Payer: Medicare Other | Admitting: Family Medicine

## 2018-11-21 ENCOUNTER — Other Ambulatory Visit: Payer: Self-pay

## 2018-11-21 VITALS — BP 180/82 | HR 91 | Temp 98.6°F | Ht 75.0 in | Wt 225.0 lb

## 2018-11-21 DIAGNOSIS — R109 Unspecified abdominal pain: Secondary | ICD-10-CM

## 2018-11-21 DIAGNOSIS — M542 Cervicalgia: Secondary | ICD-10-CM | POA: Diagnosis not present

## 2018-11-21 DIAGNOSIS — K5903 Drug induced constipation: Secondary | ICD-10-CM | POA: Diagnosis not present

## 2018-11-21 DIAGNOSIS — H04203 Unspecified epiphora, bilateral lacrimal glands: Secondary | ICD-10-CM | POA: Diagnosis not present

## 2018-11-21 MED ORDER — POLYETHYLENE GLYCOL 3350 17 GM/SCOOP PO POWD: 17.0000 g | Freq: Two times a day (BID) | ORAL | 0 refills | Status: DC | PRN

## 2018-11-21 MED ORDER — CLEAR EYES COMPLETE OP SOLN: 1.0000 [drp] | OPHTHALMIC | 0 refills | Status: DC | PRN

## 2018-11-21 MED ORDER — DICLOFENAC SODIUM 1 % TD GEL: 4.0000 g | Freq: Four times a day (QID) | TRANSDERMAL | 0 refills | Status: DC

## 2018-11-21 MED ORDER — SENNA 8.6 MG PO CAPS: 1.0000 | ORAL_CAPSULE | Freq: Every day | ORAL | 0 refills | Status: DC

## 2018-11-21 NOTE — Patient Instructions (Signed)
It was great meeting you today! I think your belly pain was likely due to constipation. I will send in a prescription for both miralax and sennakot. I will send in some voltaren gel for your neck pain. I will also give you a prescription for some clear eye drops which should help with your eye watering.

## 2018-11-24 DIAGNOSIS — M67912 Unspecified disorder of synovium and tendon, left shoulder: Secondary | ICD-10-CM | POA: Diagnosis not present

## 2018-11-24 DIAGNOSIS — H04203 Unspecified epiphora, bilateral lacrimal glands: Secondary | ICD-10-CM | POA: Insufficient documentation

## 2018-11-24 NOTE — Assessment & Plan Note (Signed)
Likely secondary to allergies.  Continue Zyrtec 10 mg daily.  Will add on Clear Eyes complete solution as needed for watery eyes.

## 2018-11-24 NOTE — Assessment & Plan Note (Signed)
While patient does have a past diagnosis of cervical nerve compression of them cervical neck, this does not appear to be similar.  Patient with mild tenderness palpation of cervical neck muscles.  Will try topical liniments and muscle strain will likely resolve on its own.  prescribed Voltaren gel -Voltaren gel applied to areas as needed

## 2018-11-24 NOTE — Progress Notes (Signed)
HPI 72 year old male who presents for follow-up for abdominal pain.  He was seen in the ED on 11/14/18 for epigastric pain.  He also had an admission on 11/07/2018.  He had MRCP and EUS which showed nonbleeding erosive gastropathy.  Of note patient was admitted on 09/29/2018 and had a right total knee arthroplasty.  He states he developed significant constipation from the pain meds after having his procedure done.  He feels like that contributed more to his abdominal pain than anything.  He has since been having regular bowel movements and is interested in preventing constipation.  Patient also says is been having some neck pain recently.  Rates it as a dull ache, that does not radiate and is located on both sides of the spine.  He has not noticed any functional deficits, no range of motion deficits.  He states his pain started about a week ago after he woke up.  He feels a little different than his "usual neck pain".  He states his eyes also been watering recently.  He takes tach which helps manage all of his other allergy symptoms but his eyes do water.  He is interested in some eyedrops to keep them from watering.  CC: ED follow-up   ROS:   Review of Systems See HPI for ROS.   CC, SH/smoking status, and VS noted  Objective: BP (!) 180/82   Pulse 91   Temp 98.6 F (37 C) (Oral)   Ht 6\' 3"  (1.905 m)   Wt 225 lb (102.1 kg)   SpO2 98%   BMI 28.12 kg/m  Gen: Pleasant 72 year old male, resting comfortably, no acute distress. HEENT: NCAT, EOMI, PERRL CV: RRR, no murmur Resp: CTAB, no wheezes, non-labored Abd: Soft nontender, nondistended, bowel sounds present, no guarding Neuro: Alert and oriented, Speech clear, No gross deficits Neck: Very mild tenderness to palpation of cervical neck muscles.  No tenderness to spine.   Assessment and plan:  Neck pain While patient does have a past diagnosis of cervical nerve compression of them cervical neck, this does not appear to be  similar.  Patient with mild tenderness palpation of cervical neck muscles.  Will try topical liniments and muscle strain will likely resolve on its own.  prescribed Voltaren gel -Voltaren gel applied to areas as needed  Constipation Patient is dressed and and having a "cleanout."  Wants to do something more than MiraLAX.  Will add on Senokot to his MiraLAX regimen.  Instructions given for how to titrate MiraLAX based on stool consistency.  Watery eyes Likely secondary to allergies.  Continue Zyrtec 10 mg daily.  Will add on Clear Eyes complete solution as needed for watery eyes.   No orders of the defined types were placed in this encounter.   Meds ordered this encounter  Medications  . diclofenac sodium (VOLTAREN) 1 % GEL    Sig: Apply 4 g topically 4 (four) times daily.    Dispense:  100 g    Refill:  0  . polyethylene glycol powder (GLYCOLAX/MIRALAX) powder    Sig: Take 17 g by mouth 2 (two) times daily as needed.    Dispense:  3350 g    Refill:  0  . Hyprom-Naphaz-Polysorb-Zn Sulf (CLEAR EYES COMPLETE) SOLN    Sig: Apply 1 drop to eye as needed.    Dispense:  15 mL    Refill:  0  . Sennosides (SENNA) 8.6 MG CAPS    Sig: Take 1 capsule by mouth daily.  Dispense:  30 each    Refill:  0     Myrene BuddyJacob Evanthia Maund MD PGY-2 Family Medicine Resident  11/24/2018 1:38 PM

## 2018-11-24 NOTE — Assessment & Plan Note (Addendum)
Patient is dressed and and having a "cleanout."  Wants to do something more than MiraLAX.  Will add on Senokot to his MiraLAX regimen.  Instructions given for how to titrate MiraLAX based on stool consistency.

## 2018-11-28 DIAGNOSIS — M546 Pain in thoracic spine: Secondary | ICD-10-CM | POA: Diagnosis not present

## 2018-11-28 DIAGNOSIS — M9902 Segmental and somatic dysfunction of thoracic region: Secondary | ICD-10-CM | POA: Diagnosis not present

## 2018-11-28 DIAGNOSIS — M47814 Spondylosis without myelopathy or radiculopathy, thoracic region: Secondary | ICD-10-CM | POA: Diagnosis not present

## 2018-12-01 ENCOUNTER — Ambulatory Visit: Payer: Medicare Other

## 2018-12-01 DIAGNOSIS — M545 Low back pain: Secondary | ICD-10-CM | POA: Diagnosis not present

## 2018-12-01 DIAGNOSIS — M25661 Stiffness of right knee, not elsewhere classified: Secondary | ICD-10-CM | POA: Diagnosis not present

## 2018-12-01 DIAGNOSIS — R262 Difficulty in walking, not elsewhere classified: Secondary | ICD-10-CM | POA: Diagnosis not present

## 2018-12-01 DIAGNOSIS — R600 Localized edema: Secondary | ICD-10-CM | POA: Diagnosis not present

## 2018-12-01 DIAGNOSIS — M6281 Muscle weakness (generalized): Secondary | ICD-10-CM | POA: Diagnosis not present

## 2018-12-01 DIAGNOSIS — G8929 Other chronic pain: Secondary | ICD-10-CM | POA: Diagnosis not present

## 2018-12-01 DIAGNOSIS — M25561 Pain in right knee: Secondary | ICD-10-CM | POA: Diagnosis not present

## 2018-12-01 DIAGNOSIS — Z96651 Presence of right artificial knee joint: Secondary | ICD-10-CM | POA: Diagnosis not present
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
## 2018-12-02 ENCOUNTER — Other Ambulatory Visit (HOSPITAL_COMMUNITY): Payer: Self-pay | Admitting: Family Medicine

## 2018-12-02 DIAGNOSIS — M542 Cervicalgia: Secondary | ICD-10-CM | POA: Diagnosis not present

## 2018-12-04 ENCOUNTER — Ambulatory Visit: Payer: Medicare Other

## 2018-12-08 ENCOUNTER — Ambulatory Visit: Payer: Medicare Other

## 2018-12-08 ENCOUNTER — Ambulatory Visit: Payer: Medicare Other | Admitting: Physical Therapy

## 2018-12-08 DIAGNOSIS — M6281 Muscle weakness (generalized): Secondary | ICD-10-CM | POA: Diagnosis not present

## 2018-12-08 DIAGNOSIS — G8929 Other chronic pain: Secondary | ICD-10-CM | POA: Diagnosis not present

## 2018-12-08 DIAGNOSIS — M545 Low back pain, unspecified: Secondary | ICD-10-CM

## 2018-12-08 DIAGNOSIS — M25661 Stiffness of right knee, not elsewhere classified: Secondary | ICD-10-CM

## 2018-12-08 DIAGNOSIS — R600 Localized edema: Secondary | ICD-10-CM | POA: Diagnosis not present

## 2018-12-08 DIAGNOSIS — R262 Difficulty in walking, not elsewhere classified: Secondary | ICD-10-CM | POA: Diagnosis not present

## 2018-12-08 DIAGNOSIS — Z96651 Presence of right artificial knee joint: Secondary | ICD-10-CM | POA: Diagnosis not present

## 2018-12-08 DIAGNOSIS — M25561 Pain in right knee: Secondary | ICD-10-CM | POA: Diagnosis not present
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
## 2018-12-11 ENCOUNTER — Ambulatory Visit: Payer: Medicare Other | Attending: Orthopedic Surgery

## 2018-12-11 DIAGNOSIS — M62830 Muscle spasm of back: Secondary | ICD-10-CM | POA: Insufficient documentation

## 2018-12-11 DIAGNOSIS — M5441 Lumbago with sciatica, right side: Secondary | ICD-10-CM | POA: Insufficient documentation

## 2018-12-11 DIAGNOSIS — M5386 Other specified dorsopathies, lumbar region: Secondary | ICD-10-CM | POA: Insufficient documentation

## 2018-12-11 DIAGNOSIS — R293 Abnormal posture: Secondary | ICD-10-CM | POA: Insufficient documentation

## 2018-12-11 DIAGNOSIS — Z96651 Presence of right artificial knee joint: Secondary | ICD-10-CM

## 2018-12-11 DIAGNOSIS — M25561 Pain in right knee: Secondary | ICD-10-CM | POA: Insufficient documentation

## 2018-12-11 DIAGNOSIS — G8929 Other chronic pain: Secondary | ICD-10-CM

## 2018-12-11 DIAGNOSIS — M25661 Stiffness of right knee, not elsewhere classified: Secondary | ICD-10-CM | POA: Insufficient documentation

## 2018-12-11 DIAGNOSIS — M545 Low back pain: Secondary | ICD-10-CM | POA: Insufficient documentation

## 2018-12-11 DIAGNOSIS — R262 Difficulty in walking, not elsewhere classified: Secondary | ICD-10-CM | POA: Insufficient documentation

## 2018-12-11 DIAGNOSIS — R600 Localized edema: Secondary | ICD-10-CM | POA: Insufficient documentation

## 2018-12-11 DIAGNOSIS — M6281 Muscle weakness (generalized): Secondary | ICD-10-CM
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
## 2018-12-12 DIAGNOSIS — M546 Pain in thoracic spine: Secondary | ICD-10-CM | POA: Diagnosis not present

## 2018-12-12 DIAGNOSIS — M9902 Segmental and somatic dysfunction of thoracic region: Secondary | ICD-10-CM | POA: Diagnosis not present

## 2018-12-12 DIAGNOSIS — M47814 Spondylosis without myelopathy or radiculopathy, thoracic region: Secondary | ICD-10-CM | POA: Diagnosis not present

## 2018-12-15 ENCOUNTER — Ambulatory Visit: Payer: Medicare Other | Admitting: Physical Therapy

## 2018-12-15 ENCOUNTER — Encounter: Payer: Self-pay | Admitting: Physical Therapy

## 2018-12-15 DIAGNOSIS — M25661 Stiffness of right knee, not elsewhere classified: Secondary | ICD-10-CM | POA: Diagnosis not present

## 2018-12-15 DIAGNOSIS — M545 Low back pain: Secondary | ICD-10-CM | POA: Diagnosis not present

## 2018-12-15 DIAGNOSIS — M5441 Lumbago with sciatica, right side: Secondary | ICD-10-CM | POA: Diagnosis present

## 2018-12-15 DIAGNOSIS — R293 Abnormal posture: Secondary | ICD-10-CM | POA: Diagnosis not present

## 2018-12-15 DIAGNOSIS — G8929 Other chronic pain: Secondary | ICD-10-CM

## 2018-12-15 DIAGNOSIS — R600 Localized edema: Secondary | ICD-10-CM | POA: Diagnosis present

## 2018-12-15 DIAGNOSIS — M6281 Muscle weakness (generalized): Secondary | ICD-10-CM | POA: Diagnosis not present

## 2018-12-15 DIAGNOSIS — M62830 Muscle spasm of back: Secondary | ICD-10-CM

## 2018-12-15 DIAGNOSIS — M25561 Pain in right knee: Secondary | ICD-10-CM

## 2018-12-15 DIAGNOSIS — M5386 Other specified dorsopathies, lumbar region: Secondary | ICD-10-CM | POA: Diagnosis not present

## 2018-12-15 DIAGNOSIS — R262 Difficulty in walking, not elsewhere classified: Secondary | ICD-10-CM | POA: Diagnosis present

## 2018-12-15 DIAGNOSIS — Z96651 Presence of right artificial knee joint: Secondary | ICD-10-CM
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
## 2018-12-16 ENCOUNTER — Other Ambulatory Visit: Payer: Self-pay

## 2018-12-16 ENCOUNTER — Ambulatory Visit (INDEPENDENT_AMBULATORY_CARE_PROVIDER_SITE_OTHER): Payer: Medicare Other | Admitting: Family Medicine

## 2018-12-16 ENCOUNTER — Other Ambulatory Visit (HOSPITAL_COMMUNITY)
Admission: RE | Admit: 2018-12-16 | Discharge: 2018-12-16 | Disposition: A | Discharge status: Home / Self Care | Payer: Medicare Other | Source: Ambulatory Visit | Attending: Family Medicine | Admitting: Family Medicine

## 2018-12-16 VITALS — BP 136/78 | HR 95 | Temp 97.9°F | Wt 219.2 lb

## 2018-12-16 DIAGNOSIS — K219 Gastro-esophageal reflux disease without esophagitis: Secondary | ICD-10-CM

## 2018-12-16 DIAGNOSIS — F339 Major depressive disorder, recurrent, unspecified: Secondary | ICD-10-CM | POA: Diagnosis not present

## 2018-12-16 DIAGNOSIS — E119 Type 2 diabetes mellitus without complications: Secondary | ICD-10-CM | POA: Diagnosis not present

## 2018-12-16 DIAGNOSIS — R300 Dysuria: Secondary | ICD-10-CM | POA: Insufficient documentation

## 2018-12-16 DIAGNOSIS — M5417 Radiculopathy, lumbosacral region: Secondary | ICD-10-CM

## 2018-12-16 LAB — POCT URINALYSIS DIP (MANUAL ENTRY)
Bilirubin, UA: NEGATIVE
Blood, UA: NEGATIVE
Glucose, UA: NEGATIVE mg/dL
Ketones, POC UA: NEGATIVE mg/dL
Leukocytes, UA: NEGATIVE
Nitrite, UA: NEGATIVE
PH UA: 6.5 (ref 5.0–8.0)
Protein Ur, POC: NEGATIVE mg/dL
SPEC GRAV UA: 1.02 (ref 1.010–1.025)
Urobilinogen, UA: 1 E.U./dL

## 2018-12-16 MED ORDER — SUCRALFATE 1 GM/10ML PO SUSP: 1.0000 g | Freq: Three times a day (TID) | ORAL | 0 refills | Status: DC

## 2018-12-16 MED ORDER — GABAPENTIN 100 MG PO CAPS: 100.0000 mg | ORAL_CAPSULE | Freq: Three times a day (TID) | ORAL | 0 refills | Status: DC

## 2018-12-16 MED ORDER — ESCITALOPRAM OXALATE 10 MG PO TABS: 10.0000 mg | ORAL_TABLET | Freq: Every day | ORAL | 1 refills | Status: DC

## 2018-12-16 NOTE — Assessment & Plan Note (Signed)
No change in symptoms, patient is out of the Carafate and would like to get some refilled  Refill done

## 2018-12-16 NOTE — Assessment & Plan Note (Signed)
Patient with longstanding history not on any current medication.  Denies SI HI but says he is still having depression anxiety issues would like to medication to "take the edge off "  We will start with low-dose Lexapro discussed expectations that this could take up to 6 weeks to work and that he should follow-up with PCP about that timeframe to discuss his reaction to it.  Also told that if he has any negative reactions he should stop immediately and call his PCP.

## 2018-12-16 NOTE — Assessment & Plan Note (Signed)
Patient with history of lumbosacral back surgery says that he is having consistent problem with radiculopathy on the right side.  He says this is been this way for years and that he is not having any leg weakness or paresthesias.  No urinary incontinence or saddle anesthesia  We will offer low-dose gabapentin and referral to Ortho to investigate potential of spinal injections.

## 2018-12-16 NOTE — Assessment & Plan Note (Signed)
Long-term diabetes, not time for new A1c check but he does have overdue status for eye exam.  List of eye doctors given.

## 2018-12-16 NOTE — Progress Notes (Signed)
    Subjective:  Brian Dominguez is a 73 y.o. male who [facility] today with a chief complaint of back pain.   HPI: Patient presents with back pain is primary complaint.  While agenda setting he reveals he has multiple multiple complaints.  DM type 2 (diabetes mellitus, type 2) Long-term diabetes, patient has been taking medication as prescribed.  Major depressive disorder, recurrent episode [facility](HCC) Patient with longstanding history not on any current medication.  Denies SI HI but says he is still having depression anxiety issues would like to medication to "take the edge off "  Dysuria Patient complaining of dysuria over the last few days.  Denies any bleeding and/or significant change in belly pain  Radiculopathy Patient with history of lumbosacral back surgery says that he is having consistent problem with radiculopathy on the right side.  He says this is been this way for years and that he is not having any leg weakness or paresthesias.  No urinary incontinence or saddle anesthesia  Esophageal reflux No change in symptoms, patient is out of the Carafate and would like to get some refilled  Objective:  Physical Exam: BP 136/78   Pulse 95   Temp 97.9 F (36.6 C) (Oral)   Wt 219 lb 3.2 oz (99.4 kg)   SpO2 96%   BMI 27.40 kg/m   Gen: NAD, resting comfortably CV: RRR with no murmurs appreciated Pulm: NWOB, CTAB with no crackles, wheezes, or rhonchi GI: Normal bowel sounds present. Soft, Nontender, Nondistended. MSK: no edema, cyanosis, or clubbing noted.   Skin: warm, dry Neuro: grossly normal, moves all extremities, no distal neuro deficits to lumbosacral area Psych: Normal affect and thought content  No results found for this or any previous visit (from the past 72 hour(s)).   Assessment/Plan:  DM type 2 (diabetes mellitus, type 2) Long-term diabetes, not time for new A1c check but he does have overdue status for eye exam.  List of eye doctors given.  Major  depressive disorder, recurrent episode [facility](HCC) Patient with longstanding history not on any current medication.  Denies SI HI but says he is still having depression anxiety issues would like to medication to "take the edge off "  We will start with low-dose Lexapro discussed expectations that this could take up to 6 weeks to work and that he should follow-up with PCP about that timeframe to discuss his reaction to it.  Also told that if he has any negative reactions he should stop immediately and call his PCP.  Dysuria Patient complaining of dysuria over the last few days.  Denies any bleeding and/or significant change in belly pain  Ordering UA with GC.  Radiculopathy Patient with history of lumbosacral back surgery says that he is having consistent problem with radiculopathy on the right side.  He says this is been this way for years and that he is not having any leg weakness or paresthesias.  No urinary incontinence or saddle anesthesia  We will offer low-dose gabapentin and referral to Ortho to investigate potential of spinal injections.  Esophageal reflux No change in symptoms, patient is out of the Carafate and would like to get some refilled  Refill done   Marthenia RollingScott Metzli Pollick, DO FAMILY MEDICINE RESIDENT - PGY2 12/16/2018 9:00 AM

## 2018-12-16 NOTE — Assessment & Plan Note (Signed)
Patient complaining of dysuria over the last few days.  Denies any bleeding and/or significant change in belly pain  Ordering UA with GC.

## 2018-12-17 LAB — URINE CYTOLOGY ANCILLARY ONLY
Chlamydia: NEGATIVE
Neisseria Gonorrhea: NEGATIVE

## 2018-12-18 ENCOUNTER — Emergency Department (HOSPITAL_COMMUNITY): Payer: Medicare Other

## 2018-12-18 ENCOUNTER — Emergency Department (HOSPITAL_COMMUNITY)
Admission: EM | Admit: 2018-12-18 | Discharge: 2018-12-18 | Disposition: A | Discharge status: Home / Self Care | Payer: Medicare Other | Attending: Emergency Medicine | Admitting: Emergency Medicine

## 2018-12-18 ENCOUNTER — Ambulatory Visit: Payer: Medicare Other

## 2018-12-18 DIAGNOSIS — Z96651 Presence of right artificial knee joint: Secondary | ICD-10-CM

## 2018-12-18 DIAGNOSIS — M545 Low back pain, unspecified: Secondary | ICD-10-CM

## 2018-12-18 DIAGNOSIS — F17210 Nicotine dependence, cigarettes, uncomplicated: Secondary | ICD-10-CM | POA: Diagnosis not present

## 2018-12-18 DIAGNOSIS — E119 Type 2 diabetes mellitus without complications: Secondary | ICD-10-CM | POA: Insufficient documentation

## 2018-12-18 DIAGNOSIS — J302 Other seasonal allergic rhinitis: Secondary | ICD-10-CM | POA: Insufficient documentation

## 2018-12-18 DIAGNOSIS — G8929 Other chronic pain: Secondary | ICD-10-CM | POA: Diagnosis not present

## 2018-12-18 DIAGNOSIS — M5441 Lumbago with sciatica, right side: Secondary | ICD-10-CM | POA: Diagnosis not present

## 2018-12-18 DIAGNOSIS — M549 Dorsalgia, unspecified: Secondary | ICD-10-CM | POA: Diagnosis present

## 2018-12-18 DIAGNOSIS — Z79899 Other long term (current) drug therapy: Secondary | ICD-10-CM | POA: Insufficient documentation

## 2018-12-18 DIAGNOSIS — M5136 Other intervertebral disc degeneration, lumbar region: Secondary | ICD-10-CM | POA: Diagnosis not present

## 2018-12-18 DIAGNOSIS — Z7982 Long term (current) use of aspirin: Secondary | ICD-10-CM | POA: Insufficient documentation

## 2018-12-18 DIAGNOSIS — I10 Essential (primary) hypertension: Secondary | ICD-10-CM | POA: Insufficient documentation

## 2018-12-18 DIAGNOSIS — M6281 Muscle weakness (generalized): Secondary | ICD-10-CM

## 2018-12-18 LAB — URINE CULTURE

## 2018-12-18 MED ORDER — OXYCODONE-ACETAMINOPHEN 5-325 MG PO TABS
1.0000 | ORAL_TABLET | Freq: Once | ORAL | Status: AC
  Administered 2018-12-18: 1 via ORAL
  Filled 2018-12-18: qty 1

## 2018-12-18 NOTE — Discharge Instructions (Addendum)
Please take Tylenol (acetaminophen) to relieve your pain.  You may take tylenol, up to 1,000 mg (two extra strength pills).  Do not take more than 3,000 mg tylenol in a 24 hour period.  Please check all medication labels as many medications such as pain and cold medications may contain tylenol. Please do not drink alcohol while taking this medication.   Today you received medications that may make you sleepy or impair your ability to make decisions.  For the next 24 hours please do not drive, operate heavy machinery, care for a small child with out another adult present, or perform any activities that may cause harm to you or someone else if you were to fall asleep or be impaired.

## 2018-12-18 NOTE — ED Notes (Signed)
Declined W/C at D/C and was escorted to lobby by RN. 
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
## 2018-12-18 NOTE — ED Triage Notes (Signed)
Pt endorses lower back pain x 3 months. Has hx of back surgery. Worse when sitting. VSS. Ambulatory.
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
## 2018-12-22 ENCOUNTER — Ambulatory Visit: Payer: Medicare Other

## 2018-12-25 ENCOUNTER — Ambulatory Visit: Payer: Medicare Other

## 2018-12-25 ENCOUNTER — Telehealth: Payer: Self-pay | Admitting: Physical Therapy

## 2018-12-25 NOTE — Telephone Encounter (Signed)
Message left related to Brian Dominguez's missed appointment today and reminded him of his 1/20 and 1/22 appointments next week. And to call if not able to make these appointments.

## 2018-12-26 ENCOUNTER — Other Ambulatory Visit: Payer: Self-pay

## 2018-12-26 MED ORDER — AMLODIPINE BESYLATE 10 MG PO TABS: 10.0000 mg | ORAL_TABLET | Freq: Every day | ORAL | 3 refills | Status: DC

## 2018-12-29 ENCOUNTER — Encounter: Payer: Medicare Other | Admitting: Physical Therapy

## 2018-12-31 ENCOUNTER — Ambulatory Visit: Payer: Medicare Other | Admitting: Physical Therapy

## 2019-01-01 DIAGNOSIS — M47816 Spondylosis without myelopathy or radiculopathy, lumbar region: Secondary | ICD-10-CM | POA: Diagnosis not present

## 2019-01-02 ENCOUNTER — Other Ambulatory Visit (HOSPITAL_COMMUNITY): Payer: Self-pay | Admitting: Family Medicine

## 2019-01-02 DIAGNOSIS — M542 Cervicalgia: Secondary | ICD-10-CM | POA: Diagnosis not present

## 2019-01-07 ENCOUNTER — Other Ambulatory Visit: Payer: Self-pay | Admitting: Family Medicine

## 2019-01-07 DIAGNOSIS — F339 Major depressive disorder, recurrent, unspecified: Secondary | ICD-10-CM

## 2019-01-07 DIAGNOSIS — E119 Type 2 diabetes mellitus without complications: Secondary | ICD-10-CM

## 2019-01-07 NOTE — Telephone Encounter (Signed)
Will forward to resident that started these medications.

## 2019-01-12 ENCOUNTER — Other Ambulatory Visit: Payer: Self-pay | Admitting: Student in an Organized Health Care Education/Training Program

## 2019-01-12 NOTE — Telephone Encounter (Signed)
pmpaware checked, risk score 160.  Will refill chronic meds  -Dr. Parke Simmers

## 2019-01-17 ENCOUNTER — Other Ambulatory Visit: Payer: Self-pay | Admitting: Student in an Organized Health Care Education/Training Program

## 2019-01-17 ENCOUNTER — Other Ambulatory Visit: Payer: Self-pay | Admitting: Family Medicine

## 2019-01-17 DIAGNOSIS — K219 Gastro-esophageal reflux disease without esophagitis: Secondary | ICD-10-CM

## 2019-01-23 DIAGNOSIS — R52 Pain, unspecified: Secondary | ICD-10-CM | POA: Diagnosis not present

## 2019-01-23 DIAGNOSIS — R1084 Generalized abdominal pain: Secondary | ICD-10-CM | POA: Diagnosis not present

## 2019-01-24 ENCOUNTER — Encounter (HOSPITAL_COMMUNITY): Payer: Self-pay

## 2019-01-24 ENCOUNTER — Other Ambulatory Visit: Payer: Self-pay

## 2019-01-24 ENCOUNTER — Emergency Department (HOSPITAL_COMMUNITY): Payer: Medicare Other

## 2019-01-24 ENCOUNTER — Emergency Department (HOSPITAL_COMMUNITY)
Admission: EM | Admit: 2019-01-24 | Discharge: 2019-01-24 | Disposition: A | Discharge status: Home / Self Care | Payer: Medicare Other | Attending: Emergency Medicine | Admitting: Emergency Medicine

## 2019-01-24 DIAGNOSIS — R300 Dysuria: Secondary | ICD-10-CM

## 2019-01-24 DIAGNOSIS — R1011 Right upper quadrant pain: Secondary | ICD-10-CM | POA: Insufficient documentation

## 2019-01-24 DIAGNOSIS — Z96612 Presence of left artificial shoulder joint: Secondary | ICD-10-CM | POA: Insufficient documentation

## 2019-01-24 DIAGNOSIS — Z96651 Presence of right artificial knee joint: Secondary | ICD-10-CM | POA: Diagnosis not present

## 2019-01-24 DIAGNOSIS — K838 Other specified diseases of biliary tract: Secondary | ICD-10-CM | POA: Diagnosis not present

## 2019-01-24 DIAGNOSIS — E119 Type 2 diabetes mellitus without complications: Secondary | ICD-10-CM | POA: Insufficient documentation

## 2019-01-24 DIAGNOSIS — Z7984 Long term (current) use of oral hypoglycemic drugs: Secondary | ICD-10-CM | POA: Diagnosis not present

## 2019-01-24 DIAGNOSIS — I10 Essential (primary) hypertension: Secondary | ICD-10-CM | POA: Diagnosis not present

## 2019-01-24 DIAGNOSIS — F17210 Nicotine dependence, cigarettes, uncomplicated: Secondary | ICD-10-CM | POA: Diagnosis not present

## 2019-01-24 DIAGNOSIS — Z79899 Other long term (current) drug therapy: Secondary | ICD-10-CM | POA: Insufficient documentation

## 2019-01-24 DIAGNOSIS — K7689 Other specified diseases of liver: Secondary | ICD-10-CM | POA: Diagnosis not present

## 2019-01-24 LAB — URINALYSIS, ROUTINE W REFLEX MICROSCOPIC
Bilirubin Urine: NEGATIVE
Glucose, UA: 50 mg/dL — AB
HGB URINE DIPSTICK: NEGATIVE
Ketones, ur: 5 mg/dL — AB
Leukocytes,Ua: NEGATIVE
Nitrite: NEGATIVE
Protein, ur: NEGATIVE mg/dL
Specific Gravity, Urine: 1.033 — ABNORMAL HIGH (ref 1.005–1.030)
pH: 5 (ref 5.0–8.0)

## 2019-01-24 LAB — CBC
HCT: 41.4 % (ref 39.0–52.0)
Hemoglobin: 13.6 g/dL (ref 13.0–17.0)
MCH: 27 pg (ref 26.0–34.0)
MCHC: 32.9 g/dL (ref 30.0–36.0)
MCV: 82.1 fL (ref 80.0–100.0)
Platelets: 266 10*3/uL (ref 150–400)
RBC: 5.04 MIL/uL (ref 4.22–5.81)
RDW: 15.3 % (ref 11.5–15.5)
WBC: 4.1 10*3/uL (ref 4.0–10.5)
nRBC: 0 % (ref 0.0–0.2)

## 2019-01-24 LAB — COMPREHENSIVE METABOLIC PANEL
ALBUMIN: 4.4 g/dL (ref 3.5–5.0)
ALT: 18 U/L (ref 0–44)
ANION GAP: 10 (ref 5–15)
AST: 21 U/L (ref 15–41)
Alkaline Phosphatase: 56 U/L (ref 38–126)
BUN: 10 mg/dL (ref 8–23)
CO2: 24 mmol/L (ref 22–32)
Calcium: 9.3 mg/dL (ref 8.9–10.3)
Chloride: 102 mmol/L (ref 98–111)
Creatinine, Ser: 0.92 mg/dL (ref 0.61–1.24)
GFR calc Af Amer: >60 mL/min (ref >60)
GFR calc non Af Amer: >60 mL/min (ref >60)
Glucose, Bld: 212 mg/dL — ABNORMAL HIGH (ref 70–99)
POTASSIUM: 3.8 mmol/L (ref 3.5–5.1)
Sodium: 136 mmol/L (ref 135–145)
TOTAL PROTEIN: 7.8 g/dL (ref 6.5–8.1)
Total Bilirubin: 0.7 mg/dL (ref 0.3–1.2)

## 2019-01-24 LAB — LIPASE, BLOOD: Lipase: 74 U/L — ABNORMAL HIGH (ref 11–51)

## 2019-01-24 MED ORDER — SODIUM CHLORIDE 0.9% FLUSH: 3.0000 mL | Freq: Once | INTRAVENOUS | Status: DC

## 2019-01-24 MED ORDER — OXYCODONE-ACETAMINOPHEN 5-325 MG PO TABS
1.0000 | ORAL_TABLET | Freq: Once | ORAL | Status: AC
  Administered 2019-01-24: 1 via ORAL
  Filled 2019-01-24: qty 1

## 2019-01-24 MED ORDER — CIPROFLOXACIN HCL 500 MG PO TABS: 500.0000 mg | ORAL_TABLET | Freq: Two times a day (BID) | ORAL | 0 refills | Status: DC

## 2019-01-24 MED ORDER — ACETAMINOPHEN ER 650 MG PO TBCR: 650.0000 mg | EXTENDED_RELEASE_TABLET | Freq: Three times a day (TID) | ORAL | 0 refills | Status: DC | PRN

## 2019-01-24 NOTE — ED Notes (Signed)
Pt reluctant to leave, after reviewing with EDP they are still in agreement for discharge. He has been made aware and is leaving
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
## 2019-01-24 NOTE — ED Notes (Signed)
Patient informed of urine sample need.

## 2019-01-24 NOTE — Discharge Instructions (Signed)
It seems like your pain is likely because of stomach ulcers based on our results here.  Please see your primary care doctor for further evaluation.  Additionally, for your burning with urination were sending you home with antibiotics.

## 2019-01-24 NOTE — ED Triage Notes (Signed)
Pt BIB [facility] complaining of RLQ pain x 3 days. Hx GERD and cholecystectomy. No pain or rigidity with palpation. He denies vomiting.

## 2019-01-26 LAB — URINE CULTURE
Culture: <10000 — AB
SPECIAL REQUESTS: NORMAL

## 2019-01-27 DIAGNOSIS — M545 Low back pain: Secondary | ICD-10-CM | POA: Diagnosis not present

## 2019-02-02 DIAGNOSIS — M542 Cervicalgia: Secondary | ICD-10-CM | POA: Diagnosis not present

## 2019-02-06 DIAGNOSIS — M47816 Spondylosis without myelopathy or radiculopathy, lumbar region: Secondary | ICD-10-CM | POA: Diagnosis not present

## 2019-02-07 ENCOUNTER — Other Ambulatory Visit (HOSPITAL_COMMUNITY): Payer: Self-pay | Admitting: Student in an Organized Health Care Education/Training Program

## 2019-02-12 DIAGNOSIS — M47816 Spondylosis without myelopathy or radiculopathy, lumbar region: Secondary | ICD-10-CM | POA: Diagnosis not present

## 2019-02-18 ENCOUNTER — Telehealth: Payer: Self-pay | Admitting: *Deleted

## 2019-02-18 NOTE — Telephone Encounter (Signed)
Patient left message on RN line.  He wanted someone to call him back about some antibiotics for a UTI.  Returned call. lmovm for pt to call back . Mahagony Grieb, Maryjo Rochester, CMA

## 2019-02-18 NOTE — Telephone Encounter (Signed)
Patient left a second message wanting a note sent to PCP about getting an abx for a UTI. Tried to return call and got voicemail again.  Call back is [phone]  Ples Specter, RN [facility] RN)

## 2019-02-19 DIAGNOSIS — M5416 Radiculopathy, lumbar region: Secondary | ICD-10-CM | POA: Diagnosis not present

## 2019-02-19 DIAGNOSIS — M5136 Other intervertebral disc degeneration, lumbar region: Secondary | ICD-10-CM | POA: Diagnosis not present

## 2019-02-19 DIAGNOSIS — E1140 Type 2 diabetes mellitus with diabetic neuropathy, unspecified: Secondary | ICD-10-CM | POA: Diagnosis not present

## 2019-02-19 DIAGNOSIS — M545 Low back pain: Secondary | ICD-10-CM | POA: Diagnosis not present

## 2019-02-19 DIAGNOSIS — M47897 Other spondylosis, lumbosacral region: Secondary | ICD-10-CM | POA: Diagnosis not present

## 2019-02-19 DIAGNOSIS — Z79891 Long term (current) use of opiate analgesic: Secondary | ICD-10-CM | POA: Diagnosis not present

## 2019-02-19 DIAGNOSIS — G894 Chronic pain syndrome: Secondary | ICD-10-CM | POA: Diagnosis not present

## 2019-02-19 DIAGNOSIS — M48062 Spinal stenosis, lumbar region with neurogenic claudication: Secondary | ICD-10-CM | POA: Diagnosis not present

## 2019-02-19 NOTE — Telephone Encounter (Signed)
Patient needs an appointment for evaluation of UTI.

## 2019-02-25 DIAGNOSIS — M545 Low back pain: Secondary | ICD-10-CM | POA: Diagnosis not present

## 2019-02-25 DIAGNOSIS — M5416 Radiculopathy, lumbar region: Secondary | ICD-10-CM | POA: Diagnosis not present

## 2019-02-25 DIAGNOSIS — M5136 Other intervertebral disc degeneration, lumbar region: Secondary | ICD-10-CM | POA: Diagnosis not present

## 2019-02-25 DIAGNOSIS — G894 Chronic pain syndrome: Secondary | ICD-10-CM | POA: Diagnosis not present

## 2019-02-26 ENCOUNTER — Telehealth: Payer: Self-pay | Admitting: Family Medicine

## 2019-02-26 NOTE — Telephone Encounter (Signed)
Patient calls today with concerns for UTI. His sxs including burning on urination. Last sexual encounter was 3 months ago, he is adamant that he does not have STI. Burning started 2 weeks ago. No fevers, hasn't checked his temperature. He went to his pain specialist yesterday and temp was normal. + R sided abdominal pain. Also has some gas. Given that a male UTI would be complicated, I'd like him to come in today or tomorrow for eval and culture. He would like to be seen tomorrow, routing to [facility] Provider as Lorain Childes.

## 2019-02-27 ENCOUNTER — Ambulatory Visit: Payer: Medicare Other

## 2019-03-03 ENCOUNTER — Ambulatory Visit: Payer: Medicare Other | Admitting: Family Medicine

## 2019-03-03 DIAGNOSIS — M542 Cervicalgia: Secondary | ICD-10-CM | POA: Diagnosis not present

## 2019-03-06 ENCOUNTER — Ambulatory Visit (INDEPENDENT_AMBULATORY_CARE_PROVIDER_SITE_OTHER): Payer: Medicare Other | Admitting: Family Medicine

## 2019-03-06 ENCOUNTER — Encounter: Payer: Self-pay | Admitting: Family Medicine

## 2019-03-06 ENCOUNTER — Other Ambulatory Visit: Payer: Self-pay

## 2019-03-06 VITALS — BP 146/70 | Temp 97.9°F | Wt 225.0 lb

## 2019-03-06 DIAGNOSIS — N401 Enlarged prostate with lower urinary tract symptoms: Secondary | ICD-10-CM | POA: Diagnosis not present

## 2019-03-06 DIAGNOSIS — E119 Type 2 diabetes mellitus without complications: Secondary | ICD-10-CM

## 2019-03-06 DIAGNOSIS — R300 Dysuria: Secondary | ICD-10-CM | POA: Diagnosis not present

## 2019-03-06 DIAGNOSIS — R3911 Hesitancy of micturition: Secondary | ICD-10-CM

## 2019-03-06 LAB — POCT URINALYSIS DIP (MANUAL ENTRY)
Blood, UA: NEGATIVE
Glucose, UA: 250 mg/dL — AB
Ketones, POC UA: NEGATIVE mg/dL
Leukocytes, UA: NEGATIVE
Nitrite, UA: NEGATIVE
Protein Ur, POC: NEGATIVE mg/dL
Spec Grav, UA: 1.025 (ref 1.010–1.025)
Urobilinogen, UA: 1 E.U./dL
pH, UA: 7 (ref 5.0–8.0)

## 2019-03-06 LAB — POCT GLYCOSYLATED HEMOGLOBIN (HGB A1C): HbA1c, POC (controlled diabetic range): 7.9 % — AB (ref 0.0–7.0)

## 2019-03-06 MED ORDER — TAMSULOSIN HCL 0.4 MG PO CAPS: 0.4000 mg | ORAL_CAPSULE | Freq: Every day | ORAL | 3 refills | Status: DC

## 2019-03-06 NOTE — Assessment & Plan Note (Signed)
CT abdomen pelvis shows mild to moderate prostate gland enlargement that is been stable.  Prostate is smooth and nontender on physical exam.  Patient does endorse hesitancy, frequency.  Start Flomax daily

## 2019-03-06 NOTE — Assessment & Plan Note (Addendum)
Patient requests to check hemoglobin A1c today.  A1c is stable at 7.9.

## 2019-03-06 NOTE — Progress Notes (Signed)
Established Patient - Clinic Visit  Subjective:  PCP: Howard Pouch, MD Patient ID: MRN [id number]  Date of birth: 01-07-1946  CC: dysuria  #Dysuria Patient reports that dysuria started 2 weeks ago.  He reports urgency, frequency, hesitancy.  He describes his urine as having some frothiness.  Patient reports that the pain is at the tip of his penis when he urinates.  He reports that he does not drink any water during the day.  Patient denies any recent history of UTI, kidney stones.  He follows with a urologist, however has not been seen for quite some time.  Patient denies fever, nausea vomiting, flank pain.  He also endorses some mild right-sided abdominal pain that has been present since February, that was evaluated in the emergency department.  During that visit in the ED, he also complained of dysuria at that time and was prescribed ciprofloxacin.  He does report having some relief in pain after taking the medication. Patient reports his last sexual encounter was over 3 months ago and does not think that this is due to that.  Review of Symptoms: See HPI  Medications & Allergies: Reviewed with patient and updated in EMR as appropriate.   Social History: Nio reports that he has been smoking cigarettes. He has a 17.50 pack-year smoking history. He has never used smokeless tobacco. He reports that he does not drink alcohol or use drugs. Objective:  Physical Exam:  BP (!) 146/70 (BP Location: Right Arm, Patient Position: Sitting, Cuff Size: Normal)   Temp 97.9 F (36.6 C)   Wt 225 lb (102.1 kg)   BMI 28.12 kg/m   General: No acute distress, nontoxic Genital: Penis appears normal with no rashes, lesions, or trauma on glans nor shaft. Not circumcised. There is no erythema or discharge from the meatus. Scrotum appears normal on inspection and without masses on palpation. Testes are symmetric in size. There is no tenderness to epididymis and spermatic cord.  There is a strong odor on exam.  Rectal & Prostate Exam: Anus appears normal on exam.  There are small specks of light brown stool present around the anus.  There are no hemorrhoids, rashes or trauma appreciated. On DRE, there is normal sphincter tone and no masses appreciated. No internal hemorrhoids palpated. There is stool in the rectum. Prostate is smooth, contender.   Pertinent Labs & Imaging:  Normal creatinine and GFR in February Point-of-care UA significant for elevated glucose.  No leuks, nitrites, negative bili, negative RBC.  It appears that patient has had frequent UAs, approximately 1/month that have all been negative since October 2019. Per chart review, patient had CT abdominal pelvis on 11/07/2018 showing stable mild to moderate prostate gland enlargement.   Assessment & Plan:  BPH (benign prostatic hyperplasia) CT abdomen pelvis shows mild to moderate prostate gland enlargement that is been stable.  Prostate is smooth and nontender on physical exam.  Patient does endorse hesitancy, frequency.  Start Flomax daily  Dysuria Patient has had ongoing dysuria for the last several months.  Per chart review, seen by Dr. Parke Simmers in January and also assessed in the ED in February.  All UAs have been negative and cultures negative for infection.  Urine gonorrhea chlamydia was negative in January and patient denies any sexual activity since.  On physical exam, there are no abnormalities physical exam is largely benign with the exception of odor likely due to poor hygiene.  Patient denies fever, chills and appears well making prostatitis or upper urinary tract infection  less likely.  Acute cystitis is ruled out with negative UA.  It is possible that patient has a urethritis secondary to STI, however, urine GC was negative in January and patient is declining STI testing today.  Nephrolithiasis is also on the differential as patient does not drink much fluids and does complain of right-sided abdominal pain, though this would be less  likely to cause a persistent pain at the urethral meatus.  Can consider CT abdomen pelvis. As patient has urinary frequency, urgency, hesitancy, will start treatment for BPH.  However, source of patient's pain at urethral meatus is not clear at this time.  In the setting of foul odor, poor hygiene may be causing some irritation.  More frequent bathing  Starting Flomax  Encourage patient to follow-up with his urologist  DM type 2 (diabetes mellitus, type 2) Patient requests to check hemoglobin A1c today.  A1c is stable at 7.9.   Genia Hotter, M.D. [facility]  PGY -1 03/06/2019, 11:49 AM

## 2019-03-06 NOTE — Assessment & Plan Note (Signed)
Patient has had ongoing dysuria for the last several months.  Per chart review, seen by Dr. Parke Simmers in January and also assessed in the ED in February.  All UAs have been negative and cultures negative for infection.  Urine gonorrhea chlamydia was negative in January and patient denies any sexual activity since.  On physical exam, there are no abnormalities physical exam is largely benign with the exception of odor likely due to poor hygiene.  Patient denies fever, chills and appears well making prostatitis or upper urinary tract infection less likely.  Acute cystitis is ruled out with negative UA.  It is possible that patient has a urethritis secondary to STI, however, urine GC was negative in January and patient is declining STI testing today.  Nephrolithiasis is also on the differential as patient does not drink much fluids and does complain of right-sided abdominal pain, though this would be less likely to cause a persistent pain at the urethral meatus.  Can consider CT abdomen pelvis. As patient has urinary frequency, urgency, hesitancy, will start treatment for BPH.  However, source of patient's pain at urethral meatus is not clear at this time.  In the setting of foul odor, poor hygiene may be causing some irritation.  More frequent bathing  Starting Flomax  Encourage patient to follow-up with his urologist

## 2019-03-06 NOTE — Patient Instructions (Signed)
Dear Brian Dominguez,   It was very nice to see you! Thank you for taking your time to come in to be seen. Today, we discussed the following:   Lower Urinary Symptoms   All of your tests were negative. We will start medication to help with decrease the size of your prostate.   Please follow up in 2 weeks or sooner for concerning or worsening symptoms.   Be well,   Dr. Genia Hotter [facility] [phone]

## 2019-03-06 NOTE — Progress Notes (Signed)
poc

## 2019-03-09 ENCOUNTER — Other Ambulatory Visit: Payer: Self-pay | Admitting: Student in an Organized Health Care Education/Training Program

## 2019-03-09 DIAGNOSIS — K219 Gastro-esophageal reflux disease without esophagitis: Secondary | ICD-10-CM

## 2019-04-08 DIAGNOSIS — Z79899 Other long term (current) drug therapy: Secondary | ICD-10-CM | POA: Diagnosis not present

## 2019-04-08 DIAGNOSIS — I10 Essential (primary) hypertension: Secondary | ICD-10-CM | POA: Diagnosis not present

## 2019-04-08 DIAGNOSIS — M545 Low back pain: Secondary | ICD-10-CM | POA: Diagnosis not present

## 2019-04-08 DIAGNOSIS — G8929 Other chronic pain: Secondary | ICD-10-CM | POA: Diagnosis not present

## 2019-04-15 ENCOUNTER — Other Ambulatory Visit: Payer: Self-pay | Admitting: Student in an Organized Health Care Education/Training Program

## 2019-04-23 DIAGNOSIS — E119 Type 2 diabetes mellitus without complications: Secondary | ICD-10-CM | POA: Diagnosis not present

## 2019-04-23 DIAGNOSIS — G8929 Other chronic pain: Secondary | ICD-10-CM | POA: Diagnosis not present

## 2019-04-23 DIAGNOSIS — M545 Low back pain: Secondary | ICD-10-CM | POA: Diagnosis not present

## 2019-04-23 DIAGNOSIS — Z79899 Other long term (current) drug therapy: Secondary | ICD-10-CM | POA: Diagnosis not present

## 2019-04-28 DIAGNOSIS — M67911 Unspecified disorder of synovium and tendon, right shoulder: Secondary | ICD-10-CM | POA: Diagnosis not present

## 2019-04-28 DIAGNOSIS — M1711 Unilateral primary osteoarthritis, right knee: Secondary | ICD-10-CM | POA: Diagnosis not present

## 2019-04-29 ENCOUNTER — Other Ambulatory Visit: Payer: Self-pay | Admitting: Student in an Organized Health Care Education/Training Program

## 2019-04-29 DIAGNOSIS — J309 Allergic rhinitis, unspecified: Secondary | ICD-10-CM

## 2019-04-30 DIAGNOSIS — F329 Major depressive disorder, single episode, unspecified: Secondary | ICD-10-CM | POA: Diagnosis not present

## 2019-04-30 DIAGNOSIS — Z125 Encounter for screening for malignant neoplasm of prostate: Secondary | ICD-10-CM | POA: Diagnosis not present

## 2019-04-30 DIAGNOSIS — E119 Type 2 diabetes mellitus without complications: Secondary | ICD-10-CM | POA: Diagnosis not present

## 2019-04-30 DIAGNOSIS — I10 Essential (primary) hypertension: Secondary | ICD-10-CM | POA: Diagnosis not present

## 2019-04-30 DIAGNOSIS — Z0000 Encounter for general adult medical examination without abnormal findings: Secondary | ICD-10-CM | POA: Diagnosis not present

## 2019-04-30 DIAGNOSIS — E559 Vitamin D deficiency, unspecified: Secondary | ICD-10-CM | POA: Diagnosis not present

## 2019-04-30 DIAGNOSIS — Z13220 Encounter for screening for lipoid disorders: Secondary | ICD-10-CM | POA: Diagnosis not present

## 2019-04-30 DIAGNOSIS — F17210 Nicotine dependence, cigarettes, uncomplicated: Secondary | ICD-10-CM | POA: Diagnosis not present

## 2019-05-13 DIAGNOSIS — M47816 Spondylosis without myelopathy or radiculopathy, lumbar region: Secondary | ICD-10-CM | POA: Diagnosis not present

## 2019-05-20 DIAGNOSIS — R300 Dysuria: Secondary | ICD-10-CM | POA: Diagnosis not present

## 2019-05-20 DIAGNOSIS — R9431 Abnormal electrocardiogram [ECG] [EKG]: Secondary | ICD-10-CM | POA: Diagnosis not present

## 2019-05-20 DIAGNOSIS — E559 Vitamin D deficiency, unspecified: Secondary | ICD-10-CM | POA: Diagnosis not present

## 2019-05-20 DIAGNOSIS — E119 Type 2 diabetes mellitus without complications: Secondary | ICD-10-CM | POA: Diagnosis not present

## 2019-05-20 DIAGNOSIS — N390 Urinary tract infection, site not specified: Secondary | ICD-10-CM | POA: Diagnosis not present

## 2019-05-27 DIAGNOSIS — R9431 Abnormal electrocardiogram [ECG] [EKG]: Secondary | ICD-10-CM | POA: Diagnosis not present

## 2019-05-27 DIAGNOSIS — I10 Essential (primary) hypertension: Secondary | ICD-10-CM | POA: Diagnosis not present

## 2019-05-27 DIAGNOSIS — I739 Peripheral vascular disease, unspecified: Secondary | ICD-10-CM | POA: Diagnosis not present

## 2019-05-27 DIAGNOSIS — E119 Type 2 diabetes mellitus without complications: Secondary | ICD-10-CM | POA: Diagnosis not present

## 2019-06-04 DIAGNOSIS — K253 Acute gastric ulcer without hemorrhage or perforation: Secondary | ICD-10-CM | POA: Diagnosis not present

## 2019-06-11 DIAGNOSIS — I739 Peripheral vascular disease, unspecified: Secondary | ICD-10-CM | POA: Diagnosis not present

## 2019-06-11 DIAGNOSIS — R42 Dizziness and giddiness: Secondary | ICD-10-CM | POA: Diagnosis not present

## 2019-06-15 IMAGING — CT CT ABD-PELV W/ CM
2 of 5 series · 15 of 46 positions shown, 17 images · IV contrast (APPLIED)
Comparison: 12/31/2016 and 03/08/2016 CT.

CLINICAL DATA: 72-year-old diabetic hypertensive male with chronic
epigastric pain, worse today. History of reflux and hiatal hernia.
Initial encounter.

EXAM:
CT ABDOMEN AND PELVIS WITH CONTRAST
TECHNIQUE: Multidetector CT imaging of the abdomen and pelvis was performed
using the standard protocol following bolus administration of
intravenous contrast.
CONTRAST:  100mL K63OTS-4UU IOPAMIDOL (K63OTS-4UU) INJECTION 61%

[Series 3: abd/ pelvis 5.0 i30f 2 · axial · 0.88mm/px · z∈[+984,+1449]mm · 12 of 105 slices shown, 14 images]
[im 6/105  soft-tissue]
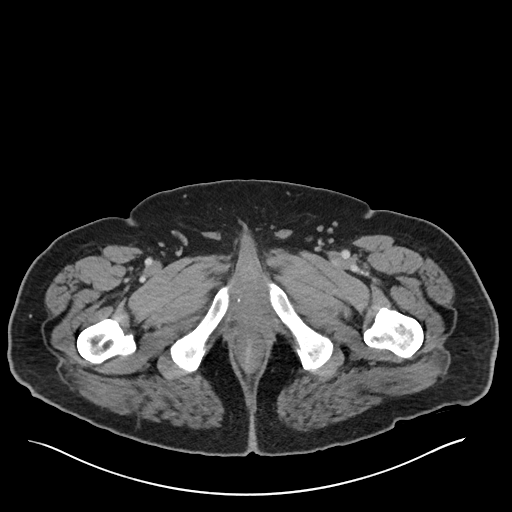
[im 6/105  bone]
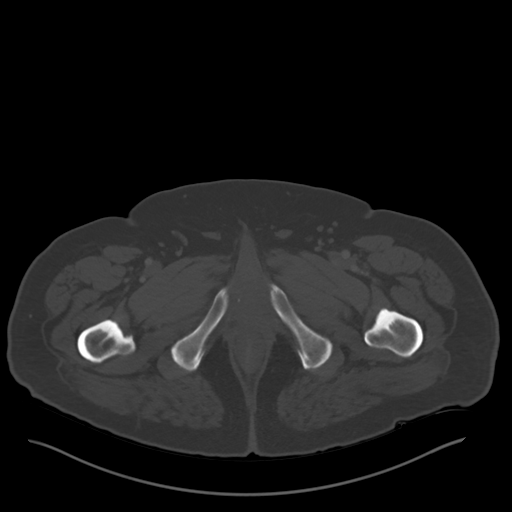
[im 17/105  soft-tissue]
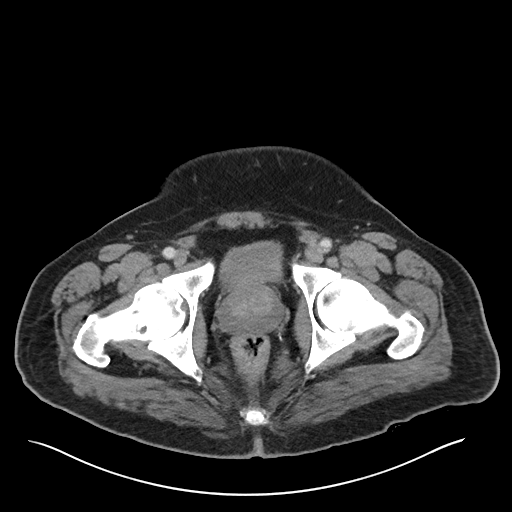
[im 22/105  soft-tissue]
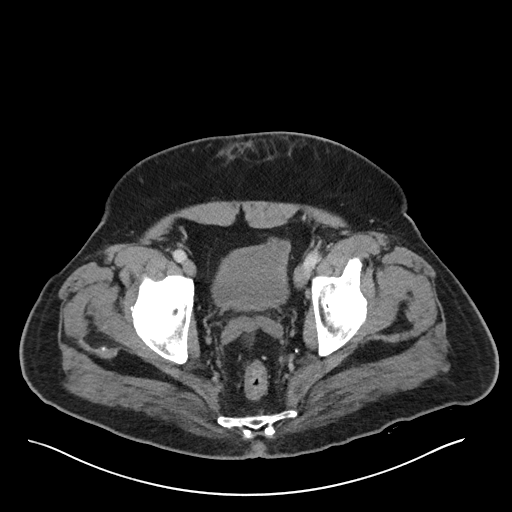
[im 33/105  soft-tissue]
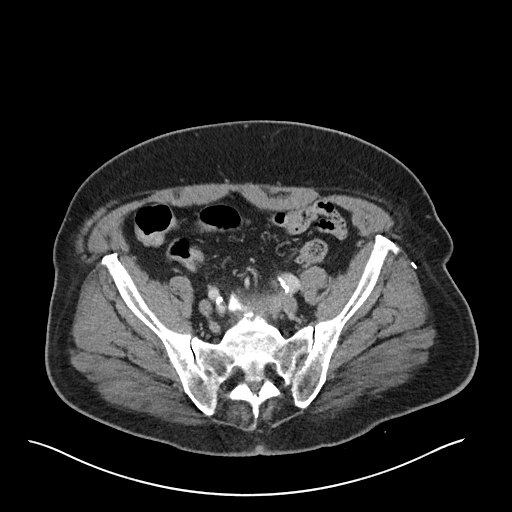
[im 39/105  soft-tissue]
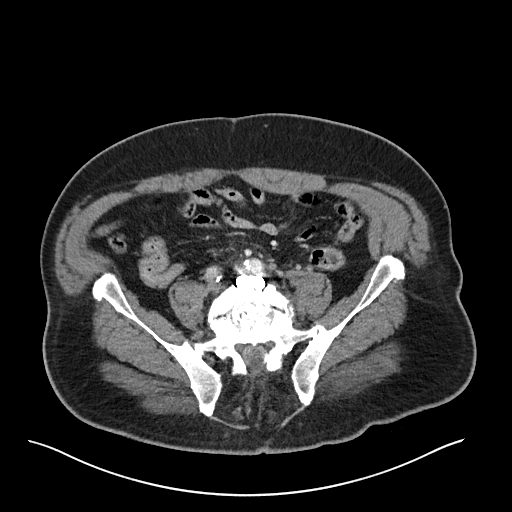
[im 50/105  soft-tissue]
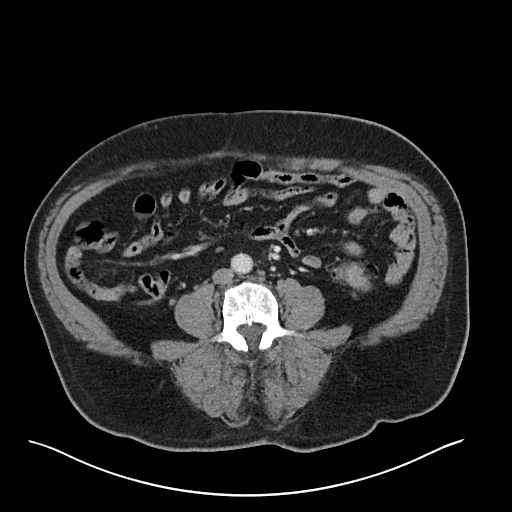
[im 55/105  soft-tissue]
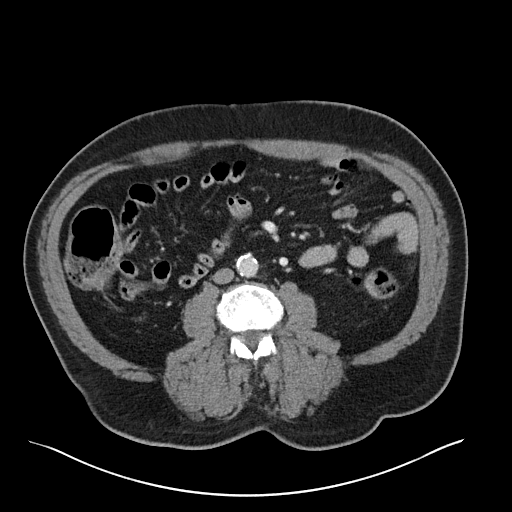
[im 66/105  soft-tissue]
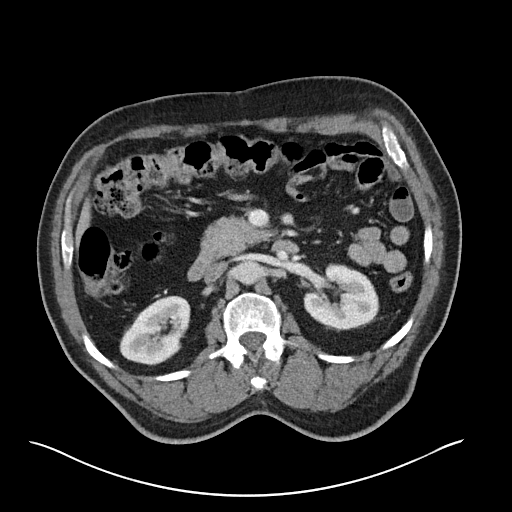
[im 72/105  soft-tissue]
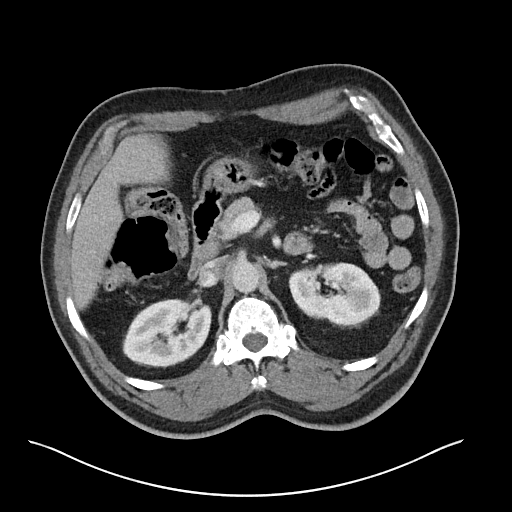
[im 72/105  bone]
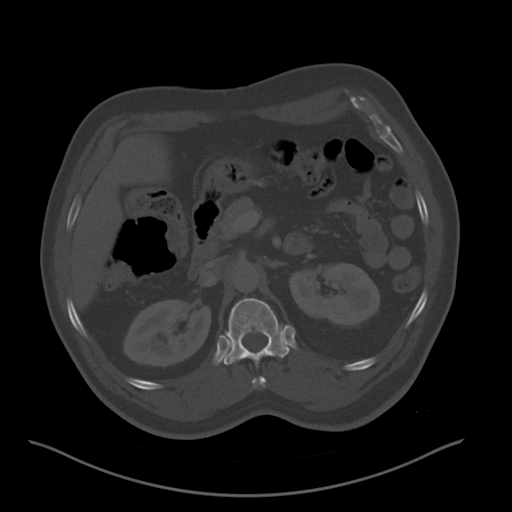
[im 83/105  soft-tissue]
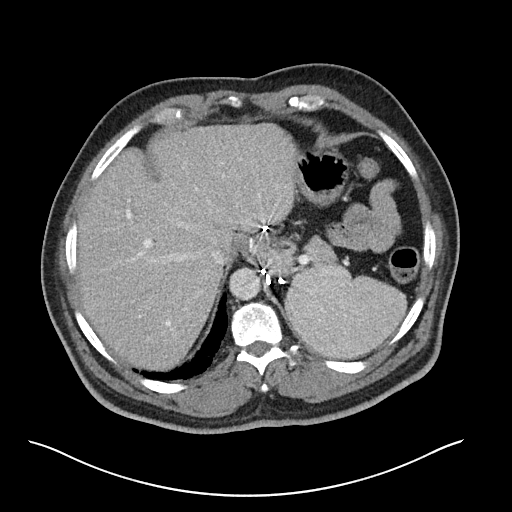
[im 88/105  soft-tissue]
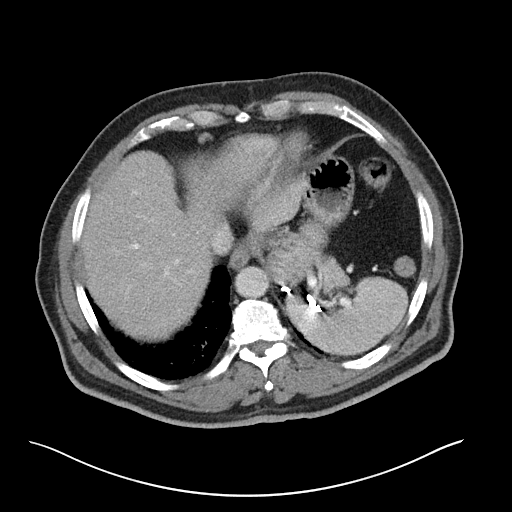
[im 99/105  soft-tissue]
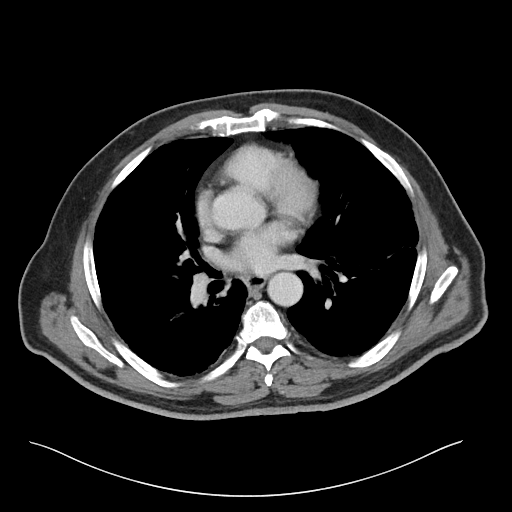

[Series 6: coronal soft tissue · coronal · 0.92mm/px · 3 of 112 slices shown]
[im 38/112  soft-tissue]
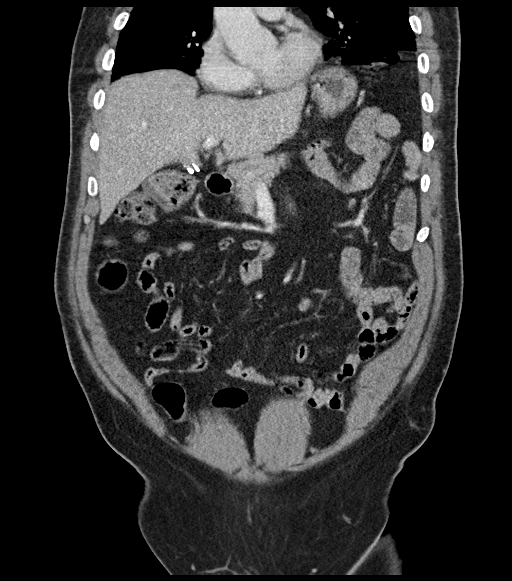
[im 50/112  soft-tissue]
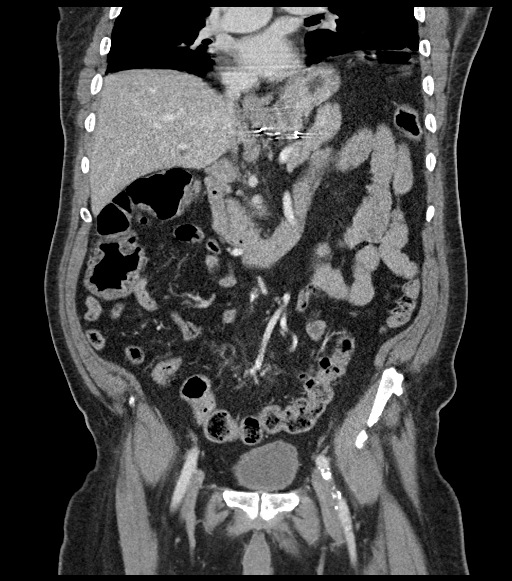
[im 62/112  soft-tissue]
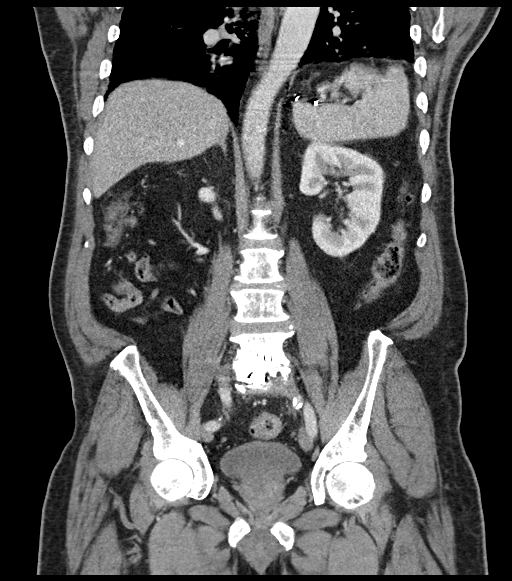

[15 of 46 positions shown; findings below may reference images not displayed]

FINDINGS: Lower chest: Chronic lung base changes similar to prior exam.
Elevated left hemidiaphragm. Heart slightly displaced to the right.
Trace coronary artery calcifications. Stable epicardial lymph node
with fatty center.

Hepatobiliary: Post cholecystectomy.  No worrisome hepatic lesion.

Pancreas: No pancreatic cystic lesion as noted on remote CT. Streak
artifact from surgical clips slightly limit evaluation. No
pancreatic inflammatory process.

Spleen: Slightly rotated spleen with adjacent surgical clips without
splenic mass or enlargement noted.

Adrenals/Urinary Tract: No obstructing stone or hydronephrosis. No
worrisome adrenal or renal mass. Noncontrast filled imaging of the
urinary bladder without obvious abnormality. Prostate gland causes
slight impression upon the bladder base.

Stomach/Bowel: Prior surgery gastroesophageal junction. Slightly
under distended stomach without gross abnormality noted.

Scattered colonic diverticula without evidence of diverticulitis. No
inflammation surrounds the appendix. No small bowel abnormality
detected.

Vascular/Lymphatic: Atherosclerotic changes aorta without aneurysm.
No large vessel occlusion. Atherosclerotic changes iliac arteries
and femoral arteries.

Celiac axis slightly prominent size lymph node which short axis
dimension of 1.1 cm. Prominent portacaval node which short axis
dimension of 2 cm. Porta hepatis lymph nodes which short axis
dimension of 1 cm. Finding stable. Etiology indeterminate.

Reproductive: Enlarged prostate gland with slight impression upon
the bladder base.

Other: No free intraperitoneal air or bowel containing hernia.

Musculoskeletal: Prior attempted fusion L4-5 with loosening of
hardware and incomplete bony fusion across the disc space with
surrounding sclerosis/fragmentation similar to the prior exams.

Subchondral cystic changes right femoral neck unchanged.
IMPRESSION: No bowel inflammatory process noted.  Scattered colonic diverticula.

Stomach slightly under distended limiting evaluation.

Celiac axis slightly prominent size lymph node which short axis
dimension of 1.1 cm. Prominent portacaval node which short axis
dimension of 2 cm. Porta hepatis lymph nodes which short axis
dimension of 1 cm. Finding stable. Etiology indeterminate.

Chronic elevated left hemidiaphragm. Postsurgical changes
gastroesophageal junction and splenic region.

Prior attempted fusion L4-5 with loosening of hardware and
incomplete bony fusion across the disc space with surrounding
sclerosis/fragmentation similar to the prior exams.

Enlarged prostate gland. Clinical and laboratory correlation
recommended.

Post cholecystectomy.

Aortic Atherosclerosis ([id number]).

## 2019-06-24 DIAGNOSIS — M62838 Other muscle spasm: Secondary | ICD-10-CM | POA: Diagnosis not present

## 2019-06-24 DIAGNOSIS — Z79899 Other long term (current) drug therapy: Secondary | ICD-10-CM | POA: Diagnosis not present

## 2019-06-24 DIAGNOSIS — M545 Low back pain: Secondary | ICD-10-CM | POA: Diagnosis not present

## 2019-06-24 DIAGNOSIS — E119 Type 2 diabetes mellitus without complications: Secondary | ICD-10-CM | POA: Diagnosis not present

## 2019-06-24 DIAGNOSIS — G8929 Other chronic pain: Secondary | ICD-10-CM | POA: Diagnosis not present

## 2019-07-02 DIAGNOSIS — E119 Type 2 diabetes mellitus without complications: Secondary | ICD-10-CM | POA: Diagnosis not present

## 2019-07-03 ENCOUNTER — Encounter: Payer: Self-pay | Admitting: Family Medicine

## 2019-07-03 DIAGNOSIS — Z720 Tobacco use: Secondary | ICD-10-CM | POA: Insufficient documentation

## 2019-07-04 ENCOUNTER — Other Ambulatory Visit: Payer: Self-pay | Admitting: Family Medicine

## 2019-07-10 DIAGNOSIS — E119 Type 2 diabetes mellitus without complications: Secondary | ICD-10-CM | POA: Diagnosis not present

## 2019-07-15 ENCOUNTER — Other Ambulatory Visit: Payer: Self-pay | Admitting: Student in an Organized Health Care Education/Training Program

## 2019-07-27 DIAGNOSIS — Z01818 Encounter for other preprocedural examination: Secondary | ICD-10-CM | POA: Diagnosis not present

## 2019-07-27 DIAGNOSIS — K253 Acute gastric ulcer without hemorrhage or perforation: Secondary | ICD-10-CM | POA: Diagnosis not present

## 2019-07-27 DIAGNOSIS — E119 Type 2 diabetes mellitus without complications: Secondary | ICD-10-CM | POA: Diagnosis not present

## 2019-08-03 ENCOUNTER — Other Ambulatory Visit: Payer: Self-pay

## 2019-08-03 ENCOUNTER — Emergency Department (HOSPITAL_COMMUNITY): Payer: Medicare HMO

## 2019-08-03 ENCOUNTER — Emergency Department (HOSPITAL_COMMUNITY)
Admission: EM | Admit: 2019-08-03 | Discharge: 2019-08-03 | Disposition: A | Discharge status: Home / Self Care | Payer: Medicare HMO | Attending: Emergency Medicine | Admitting: Emergency Medicine

## 2019-08-03 DIAGNOSIS — E119 Type 2 diabetes mellitus without complications: Secondary | ICD-10-CM | POA: Insufficient documentation

## 2019-08-03 DIAGNOSIS — I10 Essential (primary) hypertension: Secondary | ICD-10-CM | POA: Diagnosis not present

## 2019-08-03 DIAGNOSIS — F17210 Nicotine dependence, cigarettes, uncomplicated: Secondary | ICD-10-CM | POA: Insufficient documentation

## 2019-08-03 DIAGNOSIS — K429 Umbilical hernia without obstruction or gangrene: Secondary | ICD-10-CM | POA: Diagnosis not present

## 2019-08-03 DIAGNOSIS — Z79899 Other long term (current) drug therapy: Secondary | ICD-10-CM | POA: Diagnosis not present

## 2019-08-03 DIAGNOSIS — Z7984 Long term (current) use of oral hypoglycemic drugs: Secondary | ICD-10-CM | POA: Insufficient documentation

## 2019-08-03 DIAGNOSIS — K5900 Constipation, unspecified: Secondary | ICD-10-CM

## 2019-08-03 DIAGNOSIS — R1031 Right lower quadrant pain: Secondary | ICD-10-CM | POA: Insufficient documentation

## 2019-08-03 LAB — CBC
HCT: 46.7 % (ref 39.0–52.0)
Hemoglobin: 15.9 g/dL (ref 13.0–17.0)
MCH: 27.6 pg (ref 26.0–34.0)
MCHC: 34 g/dL (ref 30.0–36.0)
MCV: 80.9 fL (ref 80.0–100.0)
Platelets: 328 10*3/uL (ref 150–400)
RBC: 5.77 MIL/uL (ref 4.22–5.81)
RDW: 13.9 % (ref 11.5–15.5)
WBC: 6.2 10*3/uL (ref 4.0–10.5)
nRBC: 0 % (ref 0.0–0.2)

## 2019-08-03 LAB — URINALYSIS, ROUTINE W REFLEX MICROSCOPIC
Bilirubin Urine: NEGATIVE
Glucose, UA: NEGATIVE mg/dL
Hgb urine dipstick: NEGATIVE
Ketones, ur: NEGATIVE mg/dL
Leukocytes,Ua: NEGATIVE
Nitrite: NEGATIVE
Protein, ur: NEGATIVE mg/dL
Specific Gravity, Urine: 1.023 (ref 1.005–1.030)
pH: 5 (ref 5.0–8.0)

## 2019-08-03 LAB — COMPREHENSIVE METABOLIC PANEL
ALT: 14 U/L (ref 0–44)
AST: 16 U/L (ref 15–41)
Albumin: 4.6 g/dL (ref 3.5–5.0)
Alkaline Phosphatase: 53 U/L (ref 38–126)
Anion gap: 12 (ref 5–15)
BUN: 11 mg/dL (ref 8–23)
CO2: 26 mmol/L (ref 22–32)
Calcium: 10.1 mg/dL (ref 8.9–10.3)
Chloride: 100 mmol/L (ref 98–111)
Creatinine, Ser: 1.03 mg/dL (ref 0.61–1.24)
GFR calc Af Amer: >60 mL/min (ref >60)
GFR calc non Af Amer: >60 mL/min (ref >60)
Glucose, Bld: 162 mg/dL — ABNORMAL HIGH (ref 70–99)
Potassium: 4.2 mmol/L (ref 3.5–5.1)
Sodium: 138 mmol/L (ref 135–145)
Total Bilirubin: 1.1 mg/dL (ref 0.3–1.2)
Total Protein: 8.2 g/dL — ABNORMAL HIGH (ref 6.5–8.1)

## 2019-08-03 LAB — LIPASE, BLOOD: Lipase: 47 U/L (ref 11–51)

## 2019-08-03 MED ORDER — SODIUM CHLORIDE 0.9% FLUSH: 3.0000 mL | Freq: Once | INTRAVENOUS | Status: DC

## 2019-08-03 MED ORDER — IOHEXOL 300 MG/ML  SOLN
100.0000 mL | Freq: Once | INTRAMUSCULAR | Status: AC | PRN
  Administered 2019-08-03: 100 mL via INTRAVENOUS

## 2019-08-03 MED ORDER — FLEET ENEMA 7-19 GM/118ML RE ENEM
1.0000 | ENEMA | Freq: Once | RECTAL | Status: AC
  Administered 2019-08-03: 1 via RECTAL
  Filled 2019-08-03: qty 1

## 2019-08-03 NOTE — ED Notes (Signed)
Patient transported to CT 

## 2019-08-03 NOTE — ED Notes (Addendum)
Pt states unable to void at this time, urinal and call light at bedside 

## 2019-08-03 NOTE — ED Triage Notes (Signed)
Patient reports R sided abdominal pain x 1 week as well as bloating. Denies N/V/D, fevers/chills. Last BM 1 week ago, states he feels it could be constipation, but no relief with Colace.

## 2019-08-03 NOTE — ED Notes (Signed)
(  Medical Records Release/form signed by Patient)called @ 1009-per Dr. Ralene Bathe called by Levada Dy
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
## 2019-08-03 NOTE — Discharge Instructions (Addendum)
You can take miralax and colace, available over the counter according to label instructions.  If you do not have relief of your symptoms in 24 hours you may take magnesium citrate, available over the counter.

## 2019-08-04 DIAGNOSIS — K2270 Barrett's esophagus without dysplasia: Secondary | ICD-10-CM | POA: Diagnosis not present

## 2019-08-06 DIAGNOSIS — A048 Other specified bacterial intestinal infections: Secondary | ICD-10-CM | POA: Diagnosis not present

## 2019-08-13 DIAGNOSIS — R300 Dysuria: Secondary | ICD-10-CM | POA: Diagnosis not present

## 2019-08-13 DIAGNOSIS — K5909 Other constipation: Secondary | ICD-10-CM | POA: Diagnosis not present

## 2019-08-13 DIAGNOSIS — R1084 Generalized abdominal pain: Secondary | ICD-10-CM | POA: Diagnosis not present

## 2019-08-13 DIAGNOSIS — M67911 Unspecified disorder of synovium and tendon, right shoulder: Secondary | ICD-10-CM | POA: Diagnosis not present

## 2019-08-13 DIAGNOSIS — K2270 Barrett's esophagus without dysplasia: Secondary | ICD-10-CM | POA: Diagnosis not present

## 2019-08-26 ENCOUNTER — Emergency Department (HOSPITAL_COMMUNITY)
Admission: EM | Admit: 2019-08-26 | Discharge: 2019-08-26 | Disposition: A | Discharge status: Home / Self Care | Payer: Medicare HMO

## 2019-08-28 DIAGNOSIS — E119 Type 2 diabetes mellitus without complications: Secondary | ICD-10-CM | POA: Diagnosis not present

## 2019-08-28 DIAGNOSIS — Z79899 Other long term (current) drug therapy: Secondary | ICD-10-CM | POA: Diagnosis not present

## 2019-08-28 DIAGNOSIS — G8929 Other chronic pain: Secondary | ICD-10-CM | POA: Diagnosis not present

## 2019-08-28 DIAGNOSIS — M545 Low back pain: Secondary | ICD-10-CM | POA: Diagnosis not present

## 2019-08-28 DIAGNOSIS — F17210 Nicotine dependence, cigarettes, uncomplicated: Secondary | ICD-10-CM | POA: Diagnosis not present

## 2019-09-03 DIAGNOSIS — M545 Low back pain: Secondary | ICD-10-CM | POA: Diagnosis not present

## 2019-09-03 DIAGNOSIS — Z79899 Other long term (current) drug therapy: Secondary | ICD-10-CM | POA: Diagnosis not present

## 2019-09-08 DIAGNOSIS — K253 Acute gastric ulcer without hemorrhage or perforation: Secondary | ICD-10-CM | POA: Diagnosis not present

## 2019-09-08 DIAGNOSIS — R1084 Generalized abdominal pain: Secondary | ICD-10-CM | POA: Diagnosis not present

## 2019-09-08 DIAGNOSIS — K5909 Other constipation: Secondary | ICD-10-CM | POA: Diagnosis not present

## 2019-09-17 DIAGNOSIS — M47816 Spondylosis without myelopathy or radiculopathy, lumbar region: Secondary | ICD-10-CM | POA: Diagnosis not present

## 2019-10-01 DIAGNOSIS — Z1159 Encounter for screening for other viral diseases: Secondary | ICD-10-CM | POA: Diagnosis not present

## 2019-10-01 DIAGNOSIS — G8929 Other chronic pain: Secondary | ICD-10-CM | POA: Diagnosis not present

## 2019-10-01 DIAGNOSIS — Z79899 Other long term (current) drug therapy: Secondary | ICD-10-CM | POA: Diagnosis not present

## 2019-10-01 DIAGNOSIS — M545 Low back pain: Secondary | ICD-10-CM | POA: Diagnosis not present

## 2019-10-03 DIAGNOSIS — Z79899 Other long term (current) drug therapy: Secondary | ICD-10-CM | POA: Diagnosis not present

## 2019-10-03 DIAGNOSIS — M545 Low back pain: Secondary | ICD-10-CM | POA: Diagnosis not present

## 2019-10-08 DIAGNOSIS — K219 Gastro-esophageal reflux disease without esophagitis: Secondary | ICD-10-CM | POA: Diagnosis not present

## 2019-10-08 DIAGNOSIS — Z789 Other specified health status: Secondary | ICD-10-CM | POA: Diagnosis not present

## 2019-10-08 DIAGNOSIS — R1030 Lower abdominal pain, unspecified: Secondary | ICD-10-CM | POA: Diagnosis not present

## 2019-10-08 DIAGNOSIS — K3189 Other diseases of stomach and duodenum: Secondary | ICD-10-CM | POA: Diagnosis not present

## 2019-10-11 ENCOUNTER — Other Ambulatory Visit: Payer: Self-pay | Admitting: Family Medicine

## 2019-10-15 DIAGNOSIS — Z1211 Encounter for screening for malignant neoplasm of colon: Secondary | ICD-10-CM | POA: Diagnosis not present

## 2019-10-15 DIAGNOSIS — Z01818 Encounter for other preprocedural examination: Secondary | ICD-10-CM | POA: Diagnosis not present

## 2019-10-15 DIAGNOSIS — K635 Polyp of colon: Secondary | ICD-10-CM | POA: Diagnosis not present

## 2019-10-15 DIAGNOSIS — E119 Type 2 diabetes mellitus without complications: Secondary | ICD-10-CM | POA: Diagnosis not present

## 2019-10-21 DIAGNOSIS — K635 Polyp of colon: Secondary | ICD-10-CM | POA: Diagnosis not present

## 2019-10-29 DIAGNOSIS — M25511 Pain in right shoulder: Secondary | ICD-10-CM | POA: Diagnosis not present

## 2019-11-03 DIAGNOSIS — Z79899 Other long term (current) drug therapy: Secondary | ICD-10-CM | POA: Diagnosis not present

## 2019-11-03 DIAGNOSIS — M545 Low back pain: Secondary | ICD-10-CM | POA: Diagnosis not present

## 2019-11-11 DIAGNOSIS — Z03818 Encounter for observation for suspected exposure to other biological agents ruled out: Secondary | ICD-10-CM | POA: Diagnosis not present

## 2019-11-11 DIAGNOSIS — M545 Low back pain: Secondary | ICD-10-CM | POA: Diagnosis not present

## 2019-11-11 DIAGNOSIS — G8929 Other chronic pain: Secondary | ICD-10-CM | POA: Diagnosis not present

## 2019-11-11 DIAGNOSIS — R141 Gas pain: Secondary | ICD-10-CM | POA: Diagnosis not present

## 2019-11-11 DIAGNOSIS — Z1159 Encounter for screening for other viral diseases: Secondary | ICD-10-CM | POA: Diagnosis not present

## 2019-11-11 DIAGNOSIS — Z79899 Other long term (current) drug therapy: Secondary | ICD-10-CM | POA: Diagnosis not present

## 2019-11-12 DIAGNOSIS — Z79899 Other long term (current) drug therapy: Secondary | ICD-10-CM | POA: Diagnosis not present

## 2019-11-12 DIAGNOSIS — M545 Low back pain: Secondary | ICD-10-CM | POA: Diagnosis not present

## 2019-11-24 DIAGNOSIS — R109 Unspecified abdominal pain: Secondary | ICD-10-CM | POA: Diagnosis not present

## 2019-11-24 DIAGNOSIS — T402X5A Adverse effect of other opioids, initial encounter: Secondary | ICD-10-CM | POA: Diagnosis not present

## 2019-11-24 DIAGNOSIS — K5903 Drug induced constipation: Secondary | ICD-10-CM | POA: Diagnosis not present

## 2019-11-24 DIAGNOSIS — G8929 Other chronic pain: Secondary | ICD-10-CM | POA: Diagnosis not present

## 2019-12-01 DIAGNOSIS — Z125 Encounter for screening for malignant neoplasm of prostate: Secondary | ICD-10-CM | POA: Diagnosis not present

## 2019-12-01 DIAGNOSIS — E291 Testicular hypofunction: Secondary | ICD-10-CM | POA: Diagnosis not present

## 2019-12-01 DIAGNOSIS — N529 Male erectile dysfunction, unspecified: Secondary | ICD-10-CM | POA: Diagnosis not present

## 2019-12-01 DIAGNOSIS — Z1159 Encounter for screening for other viral diseases: Secondary | ICD-10-CM | POA: Diagnosis not present

## 2019-12-01 DIAGNOSIS — E1165 Type 2 diabetes mellitus with hyperglycemia: Secondary | ICD-10-CM | POA: Diagnosis not present

## 2019-12-01 DIAGNOSIS — J3089 Other allergic rhinitis: Secondary | ICD-10-CM | POA: Diagnosis not present

## 2019-12-01 DIAGNOSIS — R5383 Other fatigue: Secondary | ICD-10-CM | POA: Diagnosis not present

## 2019-12-03 DIAGNOSIS — Z79899 Other long term (current) drug therapy: Secondary | ICD-10-CM | POA: Diagnosis not present

## 2019-12-03 DIAGNOSIS — M545 Low back pain: Secondary | ICD-10-CM | POA: Diagnosis not present

## 2019-12-15 DIAGNOSIS — Z79899 Other long term (current) drug therapy: Secondary | ICD-10-CM | POA: Diagnosis not present

## 2019-12-15 DIAGNOSIS — Z03818 Encounter for observation for suspected exposure to other biological agents ruled out: Secondary | ICD-10-CM | POA: Diagnosis not present

## 2019-12-15 DIAGNOSIS — Z1159 Encounter for screening for other viral diseases: Secondary | ICD-10-CM | POA: Diagnosis not present

## 2019-12-15 DIAGNOSIS — E119 Type 2 diabetes mellitus without complications: Secondary | ICD-10-CM | POA: Diagnosis not present

## 2019-12-15 DIAGNOSIS — M545 Low back pain: Secondary | ICD-10-CM | POA: Diagnosis not present

## 2019-12-15 DIAGNOSIS — G8929 Other chronic pain: Secondary | ICD-10-CM | POA: Diagnosis not present

## 2019-12-30 DIAGNOSIS — M47816 Spondylosis without myelopathy or radiculopathy, lumbar region: Secondary | ICD-10-CM | POA: Diagnosis not present

## 2020-01-03 DIAGNOSIS — Z79899 Other long term (current) drug therapy: Secondary | ICD-10-CM | POA: Diagnosis not present

## 2020-01-03 DIAGNOSIS — M545 Low back pain: Secondary | ICD-10-CM | POA: Diagnosis not present

## 2020-02-03 DIAGNOSIS — Z03818 Encounter for observation for suspected exposure to other biological agents ruled out: Secondary | ICD-10-CM | POA: Diagnosis not present

## 2020-02-03 DIAGNOSIS — G8929 Other chronic pain: Secondary | ICD-10-CM | POA: Diagnosis not present

## 2020-02-03 DIAGNOSIS — N390 Urinary tract infection, site not specified: Secondary | ICD-10-CM | POA: Diagnosis not present

## 2020-02-03 DIAGNOSIS — Z79899 Other long term (current) drug therapy: Secondary | ICD-10-CM | POA: Diagnosis not present

## 2020-02-03 DIAGNOSIS — M545 Low back pain: Secondary | ICD-10-CM | POA: Diagnosis not present

## 2020-02-03 DIAGNOSIS — Z1159 Encounter for screening for other viral diseases: Secondary | ICD-10-CM | POA: Diagnosis not present

## 2020-02-05 ENCOUNTER — Emergency Department (HOSPITAL_COMMUNITY)
Admission: EM | Admit: 2020-02-05 | Discharge: 2020-02-05 | Disposition: A | Discharge status: Home / Self Care | Payer: Medicare HMO | Attending: Emergency Medicine | Admitting: Emergency Medicine

## 2020-02-05 ENCOUNTER — Emergency Department (HOSPITAL_COMMUNITY): Payer: Medicare HMO

## 2020-02-05 ENCOUNTER — Other Ambulatory Visit: Payer: Self-pay

## 2020-02-05 DIAGNOSIS — I10 Essential (primary) hypertension: Secondary | ICD-10-CM | POA: Diagnosis not present

## 2020-02-05 DIAGNOSIS — Z96651 Presence of right artificial knee joint: Secondary | ICD-10-CM | POA: Insufficient documentation

## 2020-02-05 DIAGNOSIS — E119 Type 2 diabetes mellitus without complications: Secondary | ICD-10-CM | POA: Diagnosis not present

## 2020-02-05 DIAGNOSIS — Z7984 Long term (current) use of oral hypoglycemic drugs: Secondary | ICD-10-CM | POA: Diagnosis not present

## 2020-02-05 DIAGNOSIS — F17210 Nicotine dependence, cigarettes, uncomplicated: Secondary | ICD-10-CM | POA: Diagnosis not present

## 2020-02-05 DIAGNOSIS — R52 Pain, unspecified: Secondary | ICD-10-CM | POA: Diagnosis not present

## 2020-02-05 DIAGNOSIS — Z96612 Presence of left artificial shoulder joint: Secondary | ICD-10-CM | POA: Insufficient documentation

## 2020-02-05 DIAGNOSIS — T300 Burn of unspecified body region, unspecified degree: Secondary | ICD-10-CM | POA: Diagnosis not present

## 2020-02-05 DIAGNOSIS — R109 Unspecified abdominal pain: Secondary | ICD-10-CM | POA: Diagnosis not present

## 2020-02-05 DIAGNOSIS — Z79899 Other long term (current) drug therapy: Secondary | ICD-10-CM | POA: Diagnosis not present

## 2020-02-05 LAB — CBC WITH DIFFERENTIAL/PLATELET
Abs Immature Granulocytes: 0.02 10*3/uL (ref 0.00–0.07)
Basophils Absolute: 0 10*3/uL (ref 0.0–0.1)
Basophils Relative: 1 %
Eosinophils Absolute: 0.2 10*3/uL (ref 0.0–0.5)
Eosinophils Relative: 3 %
HCT: 47 % (ref 39.0–52.0)
Hemoglobin: 15.3 g/dL (ref 13.0–17.0)
Immature Granulocytes: 0 %
Lymphocytes Relative: 40 %
Lymphs Abs: 2.4 10*3/uL (ref 0.7–4.0)
MCH: 27.5 pg (ref 26.0–34.0)
MCHC: 32.6 g/dL (ref 30.0–36.0)
MCV: 84.4 fL (ref 80.0–100.0)
Monocytes Absolute: 0.6 10*3/uL (ref 0.1–1.0)
Monocytes Relative: 10 %
Neutro Abs: 2.7 10*3/uL (ref 1.7–7.7)
Neutrophils Relative %: 46 %
Platelets: 308 10*3/uL (ref 150–400)
RBC: 5.57 MIL/uL (ref 4.22–5.81)
RDW: 13.5 % (ref 11.5–15.5)
WBC: 6 10*3/uL (ref 4.0–10.5)
nRBC: 0 % (ref 0.0–0.2)

## 2020-02-05 LAB — COMPREHENSIVE METABOLIC PANEL
ALT: 20 U/L (ref 0–44)
AST: 22 U/L (ref 15–41)
Albumin: 4.4 g/dL (ref 3.5–5.0)
Alkaline Phosphatase: 58 U/L (ref 38–126)
Anion gap: 11 (ref 5–15)
BUN: 9 mg/dL (ref 8–23)
CO2: 28 mmol/L (ref 22–32)
Calcium: 9.6 mg/dL (ref 8.9–10.3)
Chloride: 101 mmol/L (ref 98–111)
Creatinine, Ser: 0.93 mg/dL (ref 0.61–1.24)
GFR calc Af Amer: >60 mL/min (ref >60)
GFR calc non Af Amer: >60 mL/min (ref >60)
Glucose, Bld: 158 mg/dL — ABNORMAL HIGH (ref 70–99)
Potassium: 3.6 mmol/L (ref 3.5–5.1)
Sodium: 140 mmol/L (ref 135–145)
Total Bilirubin: 1 mg/dL (ref 0.3–1.2)
Total Protein: 7.7 g/dL (ref 6.5–8.1)

## 2020-02-05 LAB — URINALYSIS, ROUTINE W REFLEX MICROSCOPIC
Bacteria, UA: NONE SEEN
Bilirubin Urine: NEGATIVE
Glucose, UA: >=500 mg/dL — AB
Hgb urine dipstick: NEGATIVE
Ketones, ur: NEGATIVE mg/dL
Leukocytes,Ua: NEGATIVE
Nitrite: NEGATIVE
Protein, ur: NEGATIVE mg/dL
Specific Gravity, Urine: 1.025 (ref 1.005–1.030)
pH: 6 (ref 5.0–8.0)

## 2020-02-05 MED ORDER — ONDANSETRON HCL 4 MG/2ML IJ SOLN
4.0000 mg | Freq: Once | INTRAMUSCULAR | Status: AC
  Administered 2020-02-05: 09:00:00 4 mg via INTRAVENOUS
  Filled 2020-02-05: qty 2

## 2020-02-05 MED ORDER — MORPHINE SULFATE (PF) 4 MG/ML IV SOLN
4.0000 mg | Freq: Once | INTRAVENOUS | Status: AC
  Administered 2020-02-05: 09:00:00 4 mg via INTRAVENOUS
  Filled 2020-02-05: qty 1

## 2020-02-05 MED ORDER — DICYCLOMINE HCL 10 MG PO CAPS: 10.0000 mg | ORAL_CAPSULE | Freq: Three times a day (TID) | ORAL | 0 refills | Status: DC | PRN

## 2020-02-05 MED ORDER — MORPHINE SULFATE (PF) 4 MG/ML IV SOLN
4.0000 mg | Freq: Once | INTRAVENOUS | Status: AC
  Administered 2020-02-05: 11:00:00 4 mg via INTRAVENOUS
  Filled 2020-02-05: qty 1
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
## 2020-02-05 NOTE — ED Notes (Signed)
Pt verbalized understanding of discharge instructions. Follow up care and prescription reviewed, pt had no further questions.

## 2020-02-05 NOTE — ED Triage Notes (Signed)
Came in from home; arrived via EMS; c/o right flank pain x 3 days + painful urination. Stated recently seen OP provider and was prescribed ABX but has not started yet. EMS endorsed no BM x 1 week.

## 2020-02-05 NOTE — Discharge Instructions (Addendum)
Mr. Brian Dominguez, Brian Dominguez were seen in the emergency room for abdominal pain. Lab work and CT imaging did not show any signs of obstruction, kidney stones, infection, or problems with your blood vessels. Urine studies did not show evidence of a UTI or kidney infection. Your abdominal pain may be related to cramping or spasms in the gut. Please take Bentyl as needed at home for abdominal pain.  Follow-up with your primary care doctor as needed or if your symptoms worsen/fail to improve.

## 2020-02-05 NOTE — ED Notes (Signed)
Taken to CT.

## 2020-02-06 LAB — URINE CULTURE: Culture: NO GROWTH

## 2020-02-29 ENCOUNTER — Other Ambulatory Visit: Payer: Self-pay | Admitting: Family Medicine

## 2020-03-02 DIAGNOSIS — M545 Low back pain: Secondary | ICD-10-CM | POA: Diagnosis not present

## 2020-03-02 DIAGNOSIS — Z79899 Other long term (current) drug therapy: Secondary | ICD-10-CM | POA: Diagnosis not present

## 2020-03-30 DIAGNOSIS — Z79899 Other long term (current) drug therapy: Secondary | ICD-10-CM | POA: Diagnosis not present

## 2020-03-30 DIAGNOSIS — M545 Low back pain: Secondary | ICD-10-CM | POA: Diagnosis not present

## 2020-03-30 DIAGNOSIS — G8929 Other chronic pain: Secondary | ICD-10-CM | POA: Diagnosis not present

## 2020-03-30 DIAGNOSIS — J3089 Other allergic rhinitis: Secondary | ICD-10-CM | POA: Diagnosis not present

## 2020-03-31 DIAGNOSIS — Z96651 Presence of right artificial knee joint: Secondary | ICD-10-CM | POA: Diagnosis not present

## 2020-03-31 DIAGNOSIS — M25561 Pain in right knee: Secondary | ICD-10-CM | POA: Diagnosis not present

## 2020-03-31 DIAGNOSIS — Z96612 Presence of left artificial shoulder joint: Secondary | ICD-10-CM | POA: Diagnosis not present

## 2020-03-31 DIAGNOSIS — M1712 Unilateral primary osteoarthritis, left knee: Secondary | ICD-10-CM | POA: Diagnosis not present

## 2020-03-31 DIAGNOSIS — M25562 Pain in left knee: Secondary | ICD-10-CM | POA: Diagnosis not present

## 2020-03-31 DIAGNOSIS — M25512 Pain in left shoulder: Secondary | ICD-10-CM | POA: Diagnosis not present

## 2020-04-01 DIAGNOSIS — M25512 Pain in left shoulder: Secondary | ICD-10-CM | POA: Diagnosis not present

## 2020-04-02 DIAGNOSIS — Z79899 Other long term (current) drug therapy: Secondary | ICD-10-CM | POA: Diagnosis not present

## 2020-04-02 DIAGNOSIS — M545 Low back pain: Secondary | ICD-10-CM | POA: Diagnosis not present

## 2020-04-05 ENCOUNTER — Emergency Department (HOSPITAL_COMMUNITY)
Admission: EM | Admit: 2020-04-05 | Discharge: 2020-04-05 | Disposition: A | Discharge status: Home / Self Care | Payer: Medicare HMO | Attending: Emergency Medicine | Admitting: Emergency Medicine

## 2020-04-05 ENCOUNTER — Other Ambulatory Visit: Payer: Self-pay

## 2020-04-05 ENCOUNTER — Encounter (HOSPITAL_COMMUNITY): Payer: Self-pay

## 2020-04-05 ENCOUNTER — Emergency Department (HOSPITAL_COMMUNITY): Payer: Medicare HMO

## 2020-04-05 DIAGNOSIS — I10 Essential (primary) hypertension: Secondary | ICD-10-CM | POA: Insufficient documentation

## 2020-04-05 DIAGNOSIS — Z7984 Long term (current) use of oral hypoglycemic drugs: Secondary | ICD-10-CM | POA: Insufficient documentation

## 2020-04-05 DIAGNOSIS — R109 Unspecified abdominal pain: Secondary | ICD-10-CM | POA: Diagnosis not present

## 2020-04-05 DIAGNOSIS — E119 Type 2 diabetes mellitus without complications: Secondary | ICD-10-CM | POA: Insufficient documentation

## 2020-04-05 DIAGNOSIS — K2900 Acute gastritis without bleeding: Secondary | ICD-10-CM | POA: Insufficient documentation

## 2020-04-05 DIAGNOSIS — Z79899 Other long term (current) drug therapy: Secondary | ICD-10-CM | POA: Insufficient documentation

## 2020-04-05 DIAGNOSIS — F17210 Nicotine dependence, cigarettes, uncomplicated: Secondary | ICD-10-CM | POA: Diagnosis not present

## 2020-04-05 DIAGNOSIS — Z7982 Long term (current) use of aspirin: Secondary | ICD-10-CM | POA: Insufficient documentation

## 2020-04-05 DIAGNOSIS — R1013 Epigastric pain: Secondary | ICD-10-CM | POA: Diagnosis present

## 2020-04-05 LAB — CBC
HCT: 43.3 % (ref 39.0–52.0)
Hemoglobin: 14.2 g/dL (ref 13.0–17.0)
MCH: 26.9 pg (ref 26.0–34.0)
MCHC: 32.8 g/dL (ref 30.0–36.0)
MCV: 82.2 fL (ref 80.0–100.0)
Platelets: 350 10*3/uL (ref 150–400)
RBC: 5.27 MIL/uL (ref 4.22–5.81)
RDW: 13.5 % (ref 11.5–15.5)
WBC: 8.8 10*3/uL (ref 4.0–10.5)
nRBC: 0 % (ref 0.0–0.2)

## 2020-04-05 LAB — URINALYSIS, ROUTINE W REFLEX MICROSCOPIC
Bilirubin Urine: NEGATIVE
Glucose, UA: 150 mg/dL — AB
Hgb urine dipstick: NEGATIVE
Ketones, ur: NEGATIVE mg/dL
Leukocytes,Ua: NEGATIVE
Nitrite: NEGATIVE
Protein, ur: NEGATIVE mg/dL
Specific Gravity, Urine: 1.03 (ref 1.005–1.030)
pH: 6 (ref 5.0–8.0)

## 2020-04-05 LAB — COMPREHENSIVE METABOLIC PANEL
ALT: 22 U/L (ref 0–44)
AST: 18 U/L (ref 15–41)
Albumin: 4 g/dL (ref 3.5–5.0)
Alkaline Phosphatase: 54 U/L (ref 38–126)
Anion gap: 12 (ref 5–15)
BUN: 13 mg/dL (ref 8–23)
CO2: 27 mmol/L (ref 22–32)
Calcium: 9.5 mg/dL (ref 8.9–10.3)
Chloride: 101 mmol/L (ref 98–111)
Creatinine, Ser: 1.14 mg/dL (ref 0.61–1.24)
GFR calc Af Amer: >60 mL/min (ref >60)
GFR calc non Af Amer: >60 mL/min (ref >60)
Glucose, Bld: 195 mg/dL — ABNORMAL HIGH (ref 70–99)
Potassium: 3.7 mmol/L (ref 3.5–5.1)
Sodium: 140 mmol/L (ref 135–145)
Total Bilirubin: 0.6 mg/dL (ref 0.3–1.2)
Total Protein: 6.8 g/dL (ref 6.5–8.1)

## 2020-04-05 LAB — LIPASE, BLOOD: Lipase: 68 U/L — ABNORMAL HIGH (ref 11–51)

## 2020-04-05 MED ORDER — LIDOCAINE VISCOUS HCL 2 % MT SOLN
15.0000 mL | Freq: Once | OROMUCOSAL | Status: AC
  Administered 2020-04-05: 15 mL via ORAL
  Filled 2020-04-05: qty 15

## 2020-04-05 MED ORDER — SUCRALFATE 1 G PO TABS: 1.0000 g | ORAL_TABLET | Freq: Three times a day (TID) | ORAL | 0 refills | Status: DC

## 2020-04-05 MED ORDER — OMEPRAZOLE 20 MG PO CPDR: 20.0000 mg | DELAYED_RELEASE_CAPSULE | Freq: Every day | ORAL | 0 refills | Status: DC

## 2020-04-05 MED ORDER — ALUM & MAG HYDROXIDE-SIMETH 200-200-20 MG/5ML PO SUSP
30.0000 mL | Freq: Once | ORAL | Status: AC
  Administered 2020-04-05: 30 mL via ORAL
  Filled 2020-04-05: qty 30

## 2020-04-05 MED ORDER — ONDANSETRON HCL 4 MG/2ML IJ SOLN
4.0000 mg | Freq: Once | INTRAMUSCULAR | Status: AC
  Administered 2020-04-05: 4 mg via INTRAVENOUS
  Filled 2020-04-05: qty 2

## 2020-04-05 MED ORDER — SODIUM CHLORIDE 0.9% FLUSH: 3.0000 mL | Freq: Once | INTRAVENOUS | Status: DC

## 2020-04-05 MED ORDER — IOHEXOL 300 MG/ML  SOLN
100.0000 mL | Freq: Once | INTRAMUSCULAR | Status: AC | PRN
  Administered 2020-04-05: 100 mL via INTRAVENOUS

## 2020-04-05 MED ORDER — HYDROCODONE-ACETAMINOPHEN 5-325 MG PO TABS
1.0000 | ORAL_TABLET | Freq: Once | ORAL | Status: AC
  Administered 2020-04-05: 1 via ORAL
  Filled 2020-04-05: qty 1

## 2020-04-05 NOTE — ED Triage Notes (Signed)
Patient complains of 1 week of abdominal pain with distention and nausea. Reports no vomiting and no diarrhea. Reports normal bowel movements
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
## 2020-04-05 NOTE — Discharge Instructions (Signed)
CAT scan today did not show any new findings.  However I am concerned you may have ulcers in your stomach.  We need to restart medicine to heal the ulcers and it will be important for you to follow-up with the GI doctor as they may need to do another scope to look at your stomach.  In the meantime start taking the stomach medications.  If you start having any bloody vomit, passing out, fever or other concerns please return to the emergency room.

## 2020-06-11 ENCOUNTER — Emergency Department (HOSPITAL_COMMUNITY)
Admission: EM | Admit: 2020-06-11 | Discharge: 2020-06-11 | Disposition: A | Discharge status: Home / Self Care | Payer: Medicare Other | Attending: Emergency Medicine | Admitting: Emergency Medicine

## 2020-06-11 ENCOUNTER — Emergency Department (HOSPITAL_COMMUNITY): Payer: Medicare Other

## 2020-06-11 ENCOUNTER — Encounter (HOSPITAL_COMMUNITY): Payer: Self-pay | Admitting: Emergency Medicine

## 2020-06-11 DIAGNOSIS — K5792 Diverticulitis of intestine, part unspecified, without perforation or abscess without bleeding: Secondary | ICD-10-CM | POA: Insufficient documentation

## 2020-06-11 DIAGNOSIS — Z96612 Presence of left artificial shoulder joint: Secondary | ICD-10-CM | POA: Diagnosis not present

## 2020-06-11 DIAGNOSIS — I10 Essential (primary) hypertension: Secondary | ICD-10-CM | POA: Diagnosis not present

## 2020-06-11 DIAGNOSIS — N390 Urinary tract infection, site not specified: Secondary | ICD-10-CM | POA: Insufficient documentation

## 2020-06-11 DIAGNOSIS — K5732 Diverticulitis of large intestine without perforation or abscess without bleeding: Secondary | ICD-10-CM | POA: Diagnosis not present

## 2020-06-11 DIAGNOSIS — Z79899 Other long term (current) drug therapy: Secondary | ICD-10-CM | POA: Diagnosis not present

## 2020-06-11 DIAGNOSIS — R319 Hematuria, unspecified: Secondary | ICD-10-CM | POA: Insufficient documentation

## 2020-06-11 DIAGNOSIS — E119 Type 2 diabetes mellitus without complications: Secondary | ICD-10-CM | POA: Insufficient documentation

## 2020-06-11 DIAGNOSIS — R1084 Generalized abdominal pain: Secondary | ICD-10-CM | POA: Diagnosis present

## 2020-06-11 DIAGNOSIS — N3289 Other specified disorders of bladder: Secondary | ICD-10-CM | POA: Diagnosis not present

## 2020-06-11 DIAGNOSIS — Z7984 Long term (current) use of oral hypoglycemic drugs: Secondary | ICD-10-CM | POA: Insufficient documentation

## 2020-06-11 DIAGNOSIS — Z96651 Presence of right artificial knee joint: Secondary | ICD-10-CM | POA: Diagnosis not present

## 2020-06-11 DIAGNOSIS — F17210 Nicotine dependence, cigarettes, uncomplicated: Secondary | ICD-10-CM | POA: Insufficient documentation

## 2020-06-11 DIAGNOSIS — I700 Atherosclerosis of aorta: Secondary | ICD-10-CM | POA: Diagnosis not present

## 2020-06-11 LAB — COMPREHENSIVE METABOLIC PANEL
ALT: 13 U/L (ref 0–44)
AST: 18 U/L (ref 15–41)
Albumin: 4.1 g/dL (ref 3.5–5.0)
Alkaline Phosphatase: 65 U/L (ref 38–126)
Anion gap: 12 (ref 5–15)
BUN: 7 mg/dL — ABNORMAL LOW (ref 8–23)
CO2: 24 mmol/L (ref 22–32)
Calcium: 9.7 mg/dL (ref 8.9–10.3)
Chloride: 102 mmol/L (ref 98–111)
Creatinine, Ser: 0.96 mg/dL (ref 0.61–1.24)
GFR calc Af Amer: >60 mL/min (ref >60)
GFR calc non Af Amer: >60 mL/min (ref >60)
Glucose, Bld: 188 mg/dL — ABNORMAL HIGH (ref 70–99)
Potassium: 3.9 mmol/L (ref 3.5–5.1)
Sodium: 138 mmol/L (ref 135–145)
Total Bilirubin: 1 mg/dL (ref 0.3–1.2)
Total Protein: 7.7 g/dL (ref 6.5–8.1)

## 2020-06-11 LAB — CBC
HCT: 44.1 % (ref 39.0–52.0)
Hemoglobin: 14.7 g/dL (ref 13.0–17.0)
MCH: 27 pg (ref 26.0–34.0)
MCHC: 33.3 g/dL (ref 30.0–36.0)
MCV: 81.1 fL (ref 80.0–100.0)
Platelets: 326 10*3/uL (ref 150–400)
RBC: 5.44 MIL/uL (ref 4.22–5.81)
RDW: 13.6 % (ref 11.5–15.5)
WBC: 7 10*3/uL (ref 4.0–10.5)
nRBC: 0 % (ref 0.0–0.2)

## 2020-06-11 LAB — URINALYSIS, ROUTINE W REFLEX MICROSCOPIC
Bilirubin Urine: NEGATIVE
Glucose, UA: NEGATIVE mg/dL
Ketones, ur: NEGATIVE mg/dL
Nitrite: POSITIVE — AB
Protein, ur: 100 mg/dL — AB
Specific Gravity, Urine: 1.021 (ref 1.005–1.030)
WBC, UA: >50 WBC/hpf — ABNORMAL HIGH (ref 0–5)
pH: 7 (ref 5.0–8.0)

## 2020-06-11 LAB — LIPASE, BLOOD: Lipase: 39 U/L (ref 11–51)

## 2020-06-11 MED ORDER — CEFDINIR 300 MG PO CAPS: 300.0000 mg | ORAL_CAPSULE | Freq: Two times a day (BID) | ORAL | 0 refills | Status: AC

## 2020-06-11 MED ORDER — IOHEXOL 300 MG/ML  SOLN
100.0000 mL | Freq: Once | INTRAMUSCULAR | Status: AC | PRN
  Administered 2020-06-11: 100 mL via INTRAVENOUS

## 2020-06-11 MED ORDER — MORPHINE SULFATE (PF) 4 MG/ML IV SOLN
4.0000 mg | Freq: Once | INTRAVENOUS | Status: AC
  Administered 2020-06-11: 4 mg via INTRAVENOUS
  Filled 2020-06-11: qty 1

## 2020-06-11 MED ORDER — SODIUM CHLORIDE 0.9% FLUSH: 3.0000 mL | Freq: Once | INTRAVENOUS | Status: DC

## 2020-06-11 MED ORDER — METRONIDAZOLE 500 MG PO TABS
500.0000 mg | ORAL_TABLET | Freq: Three times a day (TID) | ORAL | 0 refills | Status: AC
Start: 2020-06-11 — End: 2020-06-21

## 2020-06-11 MED ORDER — SODIUM CHLORIDE 0.9 % IV SOLN
1.0000 g | Freq: Once | INTRAVENOUS | Status: AC
  Administered 2020-06-11: 1 g via INTRAVENOUS
  Filled 2020-06-11: qty 10

## 2020-06-11 NOTE — ED Triage Notes (Signed)
Pt reports "pain all over" and pain in lower abd that radiates down to his R lower back, denies n/v/d or bloody stools, denies fevers.

## 2020-06-11 NOTE — Discharge Instructions (Addendum)
Please take all of your antibiotics until finished!   Take your antibiotics with food.  Common side effects of antibiotics include nausea, vomiting, abdominal discomfort, and diarrhea. You may help offset some of this with probiotics which you can buy or get in yogurt. Do not eat  or take the probiotics until 2 hours after your antibiotic.    You can take 1-2 tablets of tylenol every 6 hours as needed for pain. Do not exceed more than 4000mg  of tylenol daily.   Follow up with Urology for re-evaluation of urinary tract infection. Return to the Emergency Department if any concerning signs or symptoms develop such as fever, persistent vomiting, or worsening pain.
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
## 2020-06-12 LAB — URINE CULTURE

## 2020-06-14 ENCOUNTER — Emergency Department (HOSPITAL_COMMUNITY)
Admission: EM | Admit: 2020-06-14 | Discharge: 2020-06-14 | Disposition: A | Discharge status: Home / Self Care | Payer: Medicare Other | Attending: Emergency Medicine | Admitting: Emergency Medicine

## 2020-06-14 ENCOUNTER — Encounter (HOSPITAL_COMMUNITY): Payer: Self-pay | Admitting: Emergency Medicine

## 2020-06-14 DIAGNOSIS — Z7982 Long term (current) use of aspirin: Secondary | ICD-10-CM | POA: Diagnosis not present

## 2020-06-14 DIAGNOSIS — Z7984 Long term (current) use of oral hypoglycemic drugs: Secondary | ICD-10-CM | POA: Insufficient documentation

## 2020-06-14 DIAGNOSIS — F17210 Nicotine dependence, cigarettes, uncomplicated: Secondary | ICD-10-CM | POA: Insufficient documentation

## 2020-06-14 DIAGNOSIS — K5792 Diverticulitis of intestine, part unspecified, without perforation or abscess without bleeding: Secondary | ICD-10-CM | POA: Diagnosis not present

## 2020-06-14 DIAGNOSIS — E119 Type 2 diabetes mellitus without complications: Secondary | ICD-10-CM | POA: Diagnosis not present

## 2020-06-14 DIAGNOSIS — Z96651 Presence of right artificial knee joint: Secondary | ICD-10-CM | POA: Diagnosis not present

## 2020-06-14 DIAGNOSIS — Z79899 Other long term (current) drug therapy: Secondary | ICD-10-CM | POA: Diagnosis not present

## 2020-06-14 DIAGNOSIS — I10 Essential (primary) hypertension: Secondary | ICD-10-CM | POA: Diagnosis not present

## 2020-06-14 DIAGNOSIS — R109 Unspecified abdominal pain: Secondary | ICD-10-CM | POA: Diagnosis present

## 2020-06-14 LAB — COMPREHENSIVE METABOLIC PANEL
ALT: 11 U/L (ref 0–44)
AST: 18 U/L (ref 15–41)
Albumin: 3.9 g/dL (ref 3.5–5.0)
Alkaline Phosphatase: 50 U/L (ref 38–126)
Anion gap: 12 (ref 5–15)
BUN: 6 mg/dL — ABNORMAL LOW (ref 8–23)
CO2: 23 mmol/L (ref 22–32)
Calcium: 9.3 mg/dL (ref 8.9–10.3)
Chloride: 104 mmol/L (ref 98–111)
Creatinine, Ser: 0.92 mg/dL (ref 0.61–1.24)
GFR calc Af Amer: >60 mL/min (ref >60)
GFR calc non Af Amer: >60 mL/min (ref >60)
Glucose, Bld: 167 mg/dL — ABNORMAL HIGH (ref 70–99)
Potassium: 3.4 mmol/L — ABNORMAL LOW (ref 3.5–5.1)
Sodium: 139 mmol/L (ref 135–145)
Total Bilirubin: 0.8 mg/dL (ref 0.3–1.2)
Total Protein: 7.4 g/dL (ref 6.5–8.1)

## 2020-06-14 LAB — CBC WITH DIFFERENTIAL/PLATELET
Abs Immature Granulocytes: 0.02 10*3/uL (ref 0.00–0.07)
Basophils Absolute: 0 10*3/uL (ref 0.0–0.1)
Basophils Relative: 1 %
Eosinophils Absolute: 0.2 10*3/uL (ref 0.0–0.5)
Eosinophils Relative: 3 %
HCT: 41.5 % (ref 39.0–52.0)
Hemoglobin: 13.8 g/dL (ref 13.0–17.0)
Immature Granulocytes: 0 %
Lymphocytes Relative: 20 %
Lymphs Abs: 1.4 10*3/uL (ref 0.7–4.0)
MCH: 27.2 pg (ref 26.0–34.0)
MCHC: 33.3 g/dL (ref 30.0–36.0)
MCV: 81.7 fL (ref 80.0–100.0)
Monocytes Absolute: 0.7 10*3/uL (ref 0.1–1.0)
Monocytes Relative: 9 %
Neutro Abs: 4.8 10*3/uL (ref 1.7–7.7)
Neutrophils Relative %: 67 %
Platelets: 263 10*3/uL (ref 150–400)
RBC: 5.08 MIL/uL (ref 4.22–5.81)
RDW: 13.5 % (ref 11.5–15.5)
WBC: 7.2 10*3/uL (ref 4.0–10.5)
nRBC: 0 % (ref 0.0–0.2)

## 2020-06-14 LAB — LIPASE, BLOOD: Lipase: 34 U/L (ref 11–51)

## 2020-06-14 MED ORDER — ONDANSETRON 4 MG PO TBDP
4.0000 mg | ORAL_TABLET | Freq: Three times a day (TID) | ORAL | 0 refills | Status: DC | PRN
Start: 2020-06-14 — End: 2021-09-21

## 2020-06-14 MED ORDER — SODIUM CHLORIDE 0.9 % IV BOLUS
500.0000 mL | Freq: Once | INTRAVENOUS | Status: AC
  Administered 2020-06-14: 500 mL via INTRAVENOUS

## 2020-06-14 MED ORDER — FENTANYL CITRATE (PF) 100 MCG/2ML IJ SOLN
50.0000 ug | Freq: Once | INTRAMUSCULAR | Status: AC
  Administered 2020-06-14: 50 ug via INTRAVENOUS
  Filled 2020-06-14: qty 2

## 2020-06-14 MED ORDER — ONDANSETRON HCL 4 MG/2ML IJ SOLN
4.0000 mg | Freq: Once | INTRAMUSCULAR | Status: AC
  Administered 2020-06-14: 4 mg via INTRAVENOUS
  Filled 2020-06-14: qty 2

## 2020-06-14 MED ORDER — HYDROCODONE-ACETAMINOPHEN 5-325 MG PO TABS: 1.0000 | ORAL_TABLET | Freq: Four times a day (QID) | ORAL | 0 refills | Status: DC | PRN

## 2020-06-14 NOTE — ED Notes (Signed)
Pt given gingerale and sandwich
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
## 2020-06-14 NOTE — Discharge Instructions (Addendum)
Follow-up with your doctor as needed for the diverticulitis.  Return to the ER if you are unable to eat drink and tolerate the pain.  Take the stool softener (MiraLAX ) to help keep the pain down also.  Return to the ER for worsening of symptoms.

## 2020-06-14 NOTE — ED Triage Notes (Addendum)
Pt was seen her sat diagnosed with diverticulitis, uti and possible prostatitis, states he is having continued pain in his abdomen despite taking abx, reports continued hematuria as well.

## 2020-06-18 ENCOUNTER — Emergency Department (HOSPITAL_COMMUNITY)
Admission: EM | Admit: 2020-06-18 | Discharge: 2020-06-18 | Disposition: A | Discharge status: Home / Self Care | Payer: Medicare Other | Attending: Emergency Medicine | Admitting: Emergency Medicine

## 2020-06-18 ENCOUNTER — Emergency Department (HOSPITAL_COMMUNITY): Payer: Medicare Other

## 2020-06-18 ENCOUNTER — Encounter (HOSPITAL_COMMUNITY): Payer: Self-pay

## 2020-06-18 ENCOUNTER — Other Ambulatory Visit: Payer: Self-pay

## 2020-06-18 DIAGNOSIS — Z7984 Long term (current) use of oral hypoglycemic drugs: Secondary | ICD-10-CM | POA: Insufficient documentation

## 2020-06-18 DIAGNOSIS — I700 Atherosclerosis of aorta: Secondary | ICD-10-CM | POA: Diagnosis not present

## 2020-06-18 DIAGNOSIS — Z7982 Long term (current) use of aspirin: Secondary | ICD-10-CM | POA: Insufficient documentation

## 2020-06-18 DIAGNOSIS — K5792 Diverticulitis of intestine, part unspecified, without perforation or abscess without bleeding: Secondary | ICD-10-CM | POA: Diagnosis not present

## 2020-06-18 DIAGNOSIS — E119 Type 2 diabetes mellitus without complications: Secondary | ICD-10-CM | POA: Insufficient documentation

## 2020-06-18 DIAGNOSIS — I10 Essential (primary) hypertension: Secondary | ICD-10-CM | POA: Diagnosis not present

## 2020-06-18 DIAGNOSIS — Z79899 Other long term (current) drug therapy: Secondary | ICD-10-CM | POA: Insufficient documentation

## 2020-06-18 DIAGNOSIS — K429 Umbilical hernia without obstruction or gangrene: Secondary | ICD-10-CM | POA: Diagnosis not present

## 2020-06-18 DIAGNOSIS — F17210 Nicotine dependence, cigarettes, uncomplicated: Secondary | ICD-10-CM | POA: Insufficient documentation

## 2020-06-18 DIAGNOSIS — K5732 Diverticulitis of large intestine without perforation or abscess without bleeding: Secondary | ICD-10-CM | POA: Diagnosis not present

## 2020-06-18 LAB — COMPREHENSIVE METABOLIC PANEL
ALT: 11 U/L (ref 0–44)
AST: 17 U/L (ref 15–41)
Albumin: 4.1 g/dL (ref 3.5–5.0)
Alkaline Phosphatase: 46 U/L (ref 38–126)
Anion gap: 12 (ref 5–15)
BUN: <5 mg/dL — ABNORMAL LOW (ref 8–23)
CO2: 26 mmol/L (ref 22–32)
Calcium: 9.6 mg/dL (ref 8.9–10.3)
Chloride: 103 mmol/L (ref 98–111)
Creatinine, Ser: 0.8 mg/dL (ref 0.61–1.24)
GFR calc Af Amer: >60 mL/min (ref >60)
GFR calc non Af Amer: >60 mL/min (ref >60)
Glucose, Bld: 133 mg/dL — ABNORMAL HIGH (ref 70–99)
Potassium: 3 mmol/L — ABNORMAL LOW (ref 3.5–5.1)
Sodium: 141 mmol/L (ref 135–145)
Total Bilirubin: 0.7 mg/dL (ref 0.3–1.2)
Total Protein: 7.3 g/dL (ref 6.5–8.1)

## 2020-06-18 LAB — CBC
HCT: 41.8 % (ref 39.0–52.0)
Hemoglobin: 13.8 g/dL (ref 13.0–17.0)
MCH: 26.5 pg (ref 26.0–34.0)
MCHC: 33 g/dL (ref 30.0–36.0)
MCV: 80.2 fL (ref 80.0–100.0)
Platelets: 373 10*3/uL (ref 150–400)
RBC: 5.21 MIL/uL (ref 4.22–5.81)
RDW: 13.4 % (ref 11.5–15.5)
WBC: 6.4 10*3/uL (ref 4.0–10.5)
nRBC: 0 % (ref 0.0–0.2)

## 2020-06-18 LAB — LIPASE, BLOOD: Lipase: 48 U/L (ref 11–51)

## 2020-06-18 MED ORDER — HYDROCODONE-ACETAMINOPHEN 5-325 MG PO TABS: 1.0000 | ORAL_TABLET | Freq: Four times a day (QID) | ORAL | 0 refills | Status: DC | PRN

## 2020-06-18 MED ORDER — SODIUM CHLORIDE 0.9 % IV BOLUS
500.0000 mL | Freq: Once | INTRAVENOUS | Status: AC
  Administered 2020-06-18: 500 mL via INTRAVENOUS

## 2020-06-18 MED ORDER — ONDANSETRON 4 MG PO TBDP
4.0000 mg | ORAL_TABLET | Freq: Three times a day (TID) | ORAL | 1 refills | Status: DC | PRN
Start: 2020-06-18 — End: 2020-06-28

## 2020-06-18 MED ORDER — CIPROFLOXACIN HCL 500 MG PO TABS
500.0000 mg | ORAL_TABLET | Freq: Two times a day (BID) | ORAL | 0 refills | Status: AC
Start: 2020-06-18 — End: 2020-06-23

## 2020-06-18 MED ORDER — ONDANSETRON HCL 4 MG/2ML IJ SOLN
4.0000 mg | Freq: Once | INTRAMUSCULAR | Status: AC
  Administered 2020-06-18: 4 mg via INTRAVENOUS
  Filled 2020-06-18: qty 2

## 2020-06-18 MED ORDER — SODIUM CHLORIDE 0.9 % IV SOLN: INTRAVENOUS | Status: DC

## 2020-06-18 MED ORDER — SODIUM CHLORIDE 0.9% FLUSH: 3.0000 mL | Freq: Once | INTRAVENOUS | Status: DC

## 2020-06-18 MED ORDER — METRONIDAZOLE 500 MG PO TABS
500.0000 mg | ORAL_TABLET | Freq: Three times a day (TID) | ORAL | 0 refills | Status: DC
Start: 2020-06-18 — End: 2021-09-21

## 2020-06-18 MED ORDER — IOHEXOL 300 MG/ML  SOLN
100.0000 mL | Freq: Once | INTRAMUSCULAR | Status: AC | PRN
  Administered 2020-06-18: 100 mL via INTRAVENOUS

## 2020-06-18 MED ORDER — HYDROMORPHONE HCL 1 MG/ML IJ SOLN
1.0000 mg | Freq: Once | INTRAMUSCULAR | Status: AC
  Administered 2020-06-18: 1 mg via INTRAVENOUS
  Filled 2020-06-18: qty 1

## 2020-06-18 NOTE — ED Triage Notes (Signed)
Patient diagnosed with diverticulitis lat Saturday and taking antibiotics as prescribed. Has ongoing diarrhea and states no improvement with pain.

## 2020-06-18 NOTE — Discharge Instructions (Signed)
The CT scan shows that the diverticulitis is starting to heal.  Continue the antibiotics both the Cipro and Flagyl for the next 5 days.  New prescription provided.  Take Zofran for nausea.  A new prescription for hydrocodone for pain has been provided as well.  Return for any new or worse symptoms.
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
## 2020-06-18 NOTE — ED Notes (Signed)
Patient verbalizes understanding of discharge instructions. Opportunity for questioning and answers were provided. Armband removed by staff, pt discharged from ED. Pt. ambulatory and discharged home.  

## 2020-06-20 ENCOUNTER — Ambulatory Visit (INDEPENDENT_AMBULATORY_CARE_PROVIDER_SITE_OTHER): Payer: Medicare Other | Admitting: Family Medicine

## 2020-06-20 ENCOUNTER — Other Ambulatory Visit: Payer: Self-pay

## 2020-06-20 ENCOUNTER — Emergency Department (HOSPITAL_COMMUNITY)
Admission: EM | Admit: 2020-06-20 | Discharge: 2020-06-20 | Disposition: A | Discharge status: Home / Self Care | Payer: Medicare Other | Attending: Emergency Medicine | Admitting: Emergency Medicine

## 2020-06-20 ENCOUNTER — Encounter (HOSPITAL_COMMUNITY): Payer: Self-pay

## 2020-06-20 DIAGNOSIS — Z760 Encounter for issue of repeat prescription: Secondary | ICD-10-CM | POA: Diagnosis not present

## 2020-06-20 DIAGNOSIS — K5792 Diverticulitis of intestine, part unspecified, without perforation or abscess without bleeding: Secondary | ICD-10-CM | POA: Insufficient documentation

## 2020-06-20 DIAGNOSIS — Z5321 Procedure and treatment not carried out due to patient leaving prior to being seen by health care provider: Secondary | ICD-10-CM | POA: Insufficient documentation

## 2020-06-20 MED ORDER — TRAMADOL HCL 100 MG PO TABS: 100.0000 mg | ORAL_TABLET | Freq: Every day | ORAL | 0 refills | Status: DC | PRN

## 2020-06-20 NOTE — ED Triage Notes (Signed)
Pt presents to ED for a prescription for pain. Recently dx w/diverticulitis, seen here 7/10 for ongoing pain. Pt states he lost his narcotic prescription and needs another one. No complaints at this time.

## 2020-06-20 NOTE — Assessment & Plan Note (Signed)
Assessment: Patient recently seen in the emergency department for diverticulitis, continuing on total of 5-day course of Cipro/Flagyl to be finished up on Thursday 7/15.  Patient's pain is not worsening does not have any symptoms of fevers or other concerning signs at this time.  Think it is appropriate to continue to monitor patient till he completes his course of antibiotics with return precautions provided and plan for patient to come back if symptoms do not improve by the time the antibiotic course is finished in the next few days. Plan: -Return precautions provided to patient -Provided patient with 10 doses of his previous tramadol to assist with his pain -Recommend patient follow-up with his primary care physician for chronic care management -Recommend patient call our clinic for an appointment if his symptoms do not improve by the time the antibiotic course is finished on 7/15, or return sooner to our clinic or the emergency department if he develops any fevers, blood in stool, worsening pain, or other concerning signs/symptoms.

## 2020-06-20 NOTE — Patient Instructions (Signed)
It was great to see you!  Our plans for today:  -I saw you today for your abdominal pain.  As it is not getting worse I think we can continue on the antibiotics until the course of antibiotics finishes up on this Thursday.  It should continue to get better during that time.  If you start developing any fevers, worsening abdominal pain, blood in your stool, or other symptoms of concern I would like for you to go to the emergency department or call our clinic for further recommendations.  If you do not feel better by Thursday I recommend you come back or schedule a follow-up appointment. -I will send you in a one-time prescription for 10 doses of tramadol that you have previously used for pain to help until this resolves  Take care and seek immediate care sooner if you develop any concerns.   Dr. [facility]

## 2020-06-20 NOTE — Progress Notes (Signed)
    SUBJECTIVE:   CHIEF COMPLAINT / HPI:   Back\Stomach pain Patient is a 74 year old male who presents today to discuss his back and his stomach pain.  Per chart review patient previously had a CT abdomen pelvis on the 10th, 2 days ago which showed improving diverticulitis at the splenic flexure with some mild asymmetric bladder wall thickening along the left anterior bladder wall with recommendation to correlate with outpatient cystoscopy.  The ED provider note patient is on antibiotics from 7/10 until 7/15 with Cipro and Flagyl.  Patient was also provided 8 count of hydrocodone-acetaminophen 5-325 which he states he lost.  Patient states that his pain is not worsening but he feels it should have resolved by now.  He states he continues to take the antibiotics and has 4 days of these remaining.  He states his pain is located in the same area in his lower abdomen as before and has not worsened.  He denies fevers, vomiting, blood in stool but does state he has had 2 loose bowel movements today.  PERTINENT  PMH / PSH: Diverticulitis  OBJECTIVE:   BP 132/80   Pulse (!) 106   Ht 6\' 2"  (1.88 m)   Wt 225 lb 12.8 oz (102.4 kg)   SpO2 98%   BMI 28.99 kg/m    Pulse recheck 96  General: NAD, pleasant, able to participate in exam Cardiac: S1, S2, no murmurs. Respiratory: CTAB, normal effort Abdomen: Bowel sounds present, mildly tender in lower quadrant bilaterally, nondistended, no hepatosplenomegaly. Skin: warm and dry, no rashes noted Neuro: alert, no obvious focal deficits Psych: Normal affect and mood  ASSESSMENT/PLAN:   Diverticulitis Assessment: Patient recently seen in the emergency department for diverticulitis, continuing on total of 5-day course of Cipro/Flagyl to be finished up on Thursday 7/15.  Patient's pain is not worsening does not have any symptoms of fevers or other concerning signs at this time.  Think it is appropriate to continue to monitor patient till he completes his  course of antibiotics with return precautions provided and plan for patient to come back if symptoms do not improve by the time the antibiotic course is finished in the next few days. Plan: -Return precautions provided to patient -Provided patient with 10 doses of his previous tramadol to assist with his pain -Recommend patient follow-up with his primary care physician for chronic care management -Recommend patient call our clinic for an appointment if his symptoms do not improve by the time the antibiotic course is finished on 7/15, or return sooner to our clinic or the emergency department if he develops any fevers, blood in stool, worsening pain, or other concerning signs/symptoms.     8/15, DO [facility]

## 2020-06-21 DIAGNOSIS — M9903 Segmental and somatic dysfunction of lumbar region: Secondary | ICD-10-CM | POA: Diagnosis not present

## 2020-06-21 DIAGNOSIS — M9902 Segmental and somatic dysfunction of thoracic region: Secondary | ICD-10-CM | POA: Diagnosis not present

## 2020-06-21 DIAGNOSIS — M5136 Other intervertebral disc degeneration, lumbar region: Secondary | ICD-10-CM | POA: Diagnosis not present

## 2020-06-21 DIAGNOSIS — M5134 Other intervertebral disc degeneration, thoracic region: Secondary | ICD-10-CM | POA: Diagnosis not present

## 2020-06-26 ENCOUNTER — Encounter (HOSPITAL_COMMUNITY): Payer: Self-pay | Admitting: Emergency Medicine

## 2020-06-26 ENCOUNTER — Emergency Department (HOSPITAL_COMMUNITY)
Admission: EM | Admit: 2020-06-26 | Discharge: 2020-06-26 | Disposition: A | Discharge status: Home / Self Care | Payer: Medicare Other | Attending: Emergency Medicine | Admitting: Emergency Medicine

## 2020-06-26 ENCOUNTER — Emergency Department (HOSPITAL_COMMUNITY): Payer: Medicare Other

## 2020-06-26 ENCOUNTER — Other Ambulatory Visit: Payer: Self-pay

## 2020-06-26 DIAGNOSIS — Z96651 Presence of right artificial knee joint: Secondary | ICD-10-CM | POA: Diagnosis not present

## 2020-06-26 DIAGNOSIS — Z7982 Long term (current) use of aspirin: Secondary | ICD-10-CM | POA: Diagnosis not present

## 2020-06-26 DIAGNOSIS — E119 Type 2 diabetes mellitus without complications: Secondary | ICD-10-CM | POA: Insufficient documentation

## 2020-06-26 DIAGNOSIS — K6389 Other specified diseases of intestine: Secondary | ICD-10-CM | POA: Diagnosis not present

## 2020-06-26 DIAGNOSIS — I10 Essential (primary) hypertension: Secondary | ICD-10-CM | POA: Diagnosis not present

## 2020-06-26 DIAGNOSIS — Z8719 Personal history of other diseases of the digestive system: Secondary | ICD-10-CM | POA: Diagnosis not present

## 2020-06-26 DIAGNOSIS — R1031 Right lower quadrant pain: Secondary | ICD-10-CM | POA: Insufficient documentation

## 2020-06-26 DIAGNOSIS — K5900 Constipation, unspecified: Secondary | ICD-10-CM | POA: Diagnosis not present

## 2020-06-26 DIAGNOSIS — Z7984 Long term (current) use of oral hypoglycemic drugs: Secondary | ICD-10-CM | POA: Insufficient documentation

## 2020-06-26 DIAGNOSIS — J986 Disorders of diaphragm: Secondary | ICD-10-CM | POA: Diagnosis not present

## 2020-06-26 DIAGNOSIS — Z96612 Presence of left artificial shoulder joint: Secondary | ICD-10-CM | POA: Diagnosis not present

## 2020-06-26 DIAGNOSIS — Z79899 Other long term (current) drug therapy: Secondary | ICD-10-CM | POA: Insufficient documentation

## 2020-06-26 DIAGNOSIS — J9811 Atelectasis: Secondary | ICD-10-CM | POA: Diagnosis not present

## 2020-06-26 DIAGNOSIS — J8410 Pulmonary fibrosis, unspecified: Secondary | ICD-10-CM | POA: Diagnosis not present

## 2020-06-26 DIAGNOSIS — F17210 Nicotine dependence, cigarettes, uncomplicated: Secondary | ICD-10-CM | POA: Diagnosis not present

## 2020-06-26 LAB — COMPREHENSIVE METABOLIC PANEL
ALT: 11 U/L (ref 0–44)
AST: 16 U/L (ref 15–41)
Albumin: 4 g/dL (ref 3.5–5.0)
Alkaline Phosphatase: 51 U/L (ref 38–126)
Anion gap: 12 (ref 5–15)
BUN: 5 mg/dL — ABNORMAL LOW (ref 8–23)
CO2: 24 mmol/L (ref 22–32)
Calcium: 9.4 mg/dL (ref 8.9–10.3)
Chloride: 104 mmol/L (ref 98–111)
Creatinine, Ser: 0.85 mg/dL (ref 0.61–1.24)
GFR calc Af Amer: >60 mL/min (ref >60)
GFR calc non Af Amer: >60 mL/min (ref >60)
Glucose, Bld: 157 mg/dL — ABNORMAL HIGH (ref 70–99)
Potassium: 3.6 mmol/L (ref 3.5–5.1)
Sodium: 140 mmol/L (ref 135–145)
Total Bilirubin: 0.8 mg/dL (ref 0.3–1.2)
Total Protein: 7.3 g/dL (ref 6.5–8.1)

## 2020-06-26 LAB — URINALYSIS, ROUTINE W REFLEX MICROSCOPIC
Bilirubin Urine: NEGATIVE
Glucose, UA: NEGATIVE mg/dL
Hgb urine dipstick: NEGATIVE
Ketones, ur: NEGATIVE mg/dL
Nitrite: POSITIVE — AB
Protein, ur: NEGATIVE mg/dL
Specific Gravity, Urine: >1.03 — ABNORMAL HIGH (ref 1.005–1.030)
pH: 6.5 (ref 5.0–8.0)

## 2020-06-26 LAB — CBC
HCT: 42.7 % (ref 39.0–52.0)
Hemoglobin: 14.2 g/dL (ref 13.0–17.0)
MCH: 26.6 pg (ref 26.0–34.0)
MCHC: 33.3 g/dL (ref 30.0–36.0)
MCV: 80 fL (ref 80.0–100.0)
Platelets: 334 10*3/uL (ref 150–400)
RBC: 5.34 MIL/uL (ref 4.22–5.81)
RDW: 13.9 % (ref 11.5–15.5)
WBC: 5.9 10*3/uL (ref 4.0–10.5)
nRBC: 0 % (ref 0.0–0.2)

## 2020-06-26 LAB — URINALYSIS, MICROSCOPIC (REFLEX)

## 2020-06-26 LAB — LIPASE, BLOOD: Lipase: 37 U/L (ref 11–51)

## 2020-06-26 MED ORDER — DOCUSATE SODIUM 100 MG PO CAPS
100.0000 mg | ORAL_CAPSULE | Freq: Two times a day (BID) | ORAL | 0 refills | Status: DC
Start: 2020-06-26 — End: 2021-09-21

## 2020-06-26 MED ORDER — POLYETHYLENE GLYCOL 3350 17 G PO PACK
17.0000 g | PACK | Freq: Every day | ORAL | 0 refills | Status: DC
Start: 2020-06-26 — End: 2021-02-25

## 2020-06-26 MED ORDER — MORPHINE SULFATE (PF) 4 MG/ML IV SOLN
4.0000 mg | Freq: Once | INTRAVENOUS | Status: AC
  Administered 2020-06-26: 4 mg via INTRAVENOUS
  Filled 2020-06-26: qty 1

## 2020-06-26 MED ORDER — ONDANSETRON 4 MG PO TBDP: 4.0000 mg | ORAL_TABLET | ORAL | 0 refills | Status: DC | PRN

## 2020-06-26 MED ORDER — AMLODIPINE BESYLATE 5 MG PO TABS
10.0000 mg | ORAL_TABLET | Freq: Once | ORAL | Status: AC
  Administered 2020-06-26: 5 mg via ORAL
  Filled 2020-06-26: qty 2

## 2020-06-26 MED ORDER — AMLODIPINE BESYLATE 10 MG PO TABS
10.0000 mg | ORAL_TABLET | Freq: Every day | ORAL | 1 refills | Status: DC
Start: 2020-06-26 — End: 2021-09-21

## 2020-06-26 MED ORDER — ONDANSETRON 4 MG PO TBDP
4.0000 mg | ORAL_TABLET | Freq: Once | ORAL | Status: AC
  Administered 2020-06-26: 4 mg via ORAL
  Filled 2020-06-26: qty 1

## 2020-06-26 MED ORDER — AMLODIPINE BESYLATE 5 MG PO TABS
5.0000 mg | ORAL_TABLET | Freq: Once | ORAL | Status: AC
  Administered 2020-06-26: 5 mg via ORAL
  Filled 2020-06-26: qty 1

## 2020-06-26 MED ORDER — SODIUM CHLORIDE 0.9% FLUSH: 3.0000 mL | Freq: Once | INTRAVENOUS | Status: DC

## 2020-06-26 NOTE — ED Notes (Signed)
Pt aware we need urine sample.  

## 2020-06-26 NOTE — ED Triage Notes (Signed)
Pt. Stated, I need something for pain before I have to sit out there and wait.
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
## 2020-06-26 NOTE — ED Notes (Signed)
Pt was given Morphine via IM, no PIV at this time.

## 2020-06-26 NOTE — Discharge Instructions (Addendum)
1.  Your last CT scan done on 7\10\2021, was showing improvement of your diverticulitis.  Your x-ray today suggest that you have constipation.  It is common to get constipation after having been treated with pain medications.  At this time, you need to take MiraLAX daily as prescribed.  Take Colace twice daily.  Once you are having regular bowel movements, you may take MiraLAX as needed every 3 days if you have not had a normal bowel movement.  Continue taking Colace twice daily until you see your doctor for a follow-up visit. 2.  It is very important that you see your family doctor for a follow-up visit.  You may need referral to a gastroenterologist for colonoscopy.  After an episode of diverticulitis, or unanticipated constipation, you may need a colonoscopy to make sure you do not have serious conditions such as cancer. 3.  Follow-up with [facility] if you do not have a doctor or use the referral number to discharge instructions to call one.  You will need referral to see the gastroenterologist for a colonoscopy. 4.  Your blood pressure medication amlodipine has been refilled at the pharmacy. 5.  Return to the emergency department if you have new or worsening symptoms.

## 2020-06-26 NOTE — ED Triage Notes (Signed)
Pt. Stated, Ive had diverticulitis for 2 weeks and I need something to help this pain

## 2020-06-27 DIAGNOSIS — R3911 Hesitancy of micturition: Secondary | ICD-10-CM | POA: Diagnosis not present

## 2020-06-27 DIAGNOSIS — R9341 Abnormal radiologic findings on diagnostic imaging of renal pelvis, ureter, or bladder: Secondary | ICD-10-CM | POA: Diagnosis not present

## 2020-06-27 DIAGNOSIS — R300 Dysuria: Secondary | ICD-10-CM | POA: Diagnosis not present

## 2020-06-28 ENCOUNTER — Encounter: Payer: Self-pay | Admitting: Family Medicine

## 2020-06-28 ENCOUNTER — Other Ambulatory Visit: Payer: Self-pay

## 2020-06-28 ENCOUNTER — Ambulatory Visit (INDEPENDENT_AMBULATORY_CARE_PROVIDER_SITE_OTHER): Payer: Medicare Other | Admitting: Family Medicine

## 2020-06-28 VITALS — BP 132/80 | HR 99 | Ht 74.0 in | Wt 223.6 lb

## 2020-06-28 DIAGNOSIS — M542 Cervicalgia: Secondary | ICD-10-CM | POA: Diagnosis not present

## 2020-06-28 DIAGNOSIS — K625 Hemorrhage of anus and rectum: Secondary | ICD-10-CM | POA: Diagnosis not present

## 2020-06-28 DIAGNOSIS — K5792 Diverticulitis of intestine, part unspecified, without perforation or abscess without bleeding: Secondary | ICD-10-CM | POA: Diagnosis not present

## 2020-06-28 DIAGNOSIS — E119 Type 2 diabetes mellitus without complications: Secondary | ICD-10-CM

## 2020-06-28 LAB — POCT GLYCOSYLATED HEMOGLOBIN (HGB A1C): HbA1c, POC (controlled diabetic range): 7.7 % — AB (ref 0.0–7.0)

## 2020-06-28 LAB — URINE CULTURE: Culture: <10000 — AB

## 2020-06-28 LAB — POCT HEMOGLOBIN: Hemoglobin: 14.9 g/dL — AB (ref 11–14.6)

## 2020-06-28 NOTE — Assessment & Plan Note (Addendum)
Patient recently seen for issues with diverticulitis and was given Cipro Flagyl which she completed on 7/15.  Symptoms are not worsening but patient reports having very loose stools.  Describes them as not watery but very loose.  Is taking Colace for which she was prescribed twice daily but has been taking it 3 times daily.  Patient is also complaining of rectal pain with blood occasionally on his stool and tissue paper.  Rectal exam was significant for abrasions/excoriations on the rectum.  These were oozing on exam.  Patient denies any fever or chills. -Handout provided describing sitz bath. -Recommended Colace at prescribed dose or he may transition to MiraLAX if he prefers -Patient requests prescription for preparation H but then says he will pick it up over-the-counter -Patient requesting pain medication refill.  I will prescribe 10 doses of tramadol which he was previously on but he has been instructed he must follow-up with his pain medicine specialist for chronic pain medications -Referral placed to assist with possibly transitioning to new pain medicine specialist -Strict return precautions given

## 2020-06-28 NOTE — Progress Notes (Signed)
    SUBJECTIVE:   CHIEF COMPLAINT / HPI:   Chronic pain Patient has longstanding history of chronic pain for which she was treated by pain medicine specialist Dr. Aurther Loft at [facility].  Patient's main pain complaint is in his back.  Patient reports that he has not seen him for several months because he cannot make it across town for visits.  He would like for me to refill his tramadol prescription.  I discussed that because he is being treated by pain medicine specialist he needs to see them for his opiates.  Patient is frustrated that I cannot prescribe them today but is eventually understanding.  I informed him that I will prescribe a short course to give him time to see his pain medicine specialist.  He requests a referral so that he may move to a closer pain medicine specialist.  Loose stools Patient reports he was recently seen in the emergency room for abdominal pain.  The completed and x-ray which showed large stool burden.  He was prescribed Colace twice daily.  Patient reports he has been taking it 3 times daily and has been having really loose stools with burning in his rectum.  Reports seeing bright red blood on his tissue paper and stool on one occurrence.  Most recent bowel movement had no blood on stool or on toilet paper.  Patient reports he was told he had diverticulitis and has questions regarding if that will ever resolve or if it has resolved.  We discussed diverticulitis and I gave him a handout.  He reports intermittent constipation and that he used to take tramadol for chronic pain and thought that that would not cause constipation.   OBJECTIVE:   BP 132/80   Pulse 99   Ht 6\' 2"  (1.88 m)   Wt 223 lb 9.6 oz (101.4 kg)   SpO2 99%   BMI 28.71 kg/m   General: Well-appearing, no acute distress Cardiac: Regular rate and rhythm, no murmurs appreciated Respiratory: Normal work of breathing Abdomen: Soft, mild tenderness to palpation on left side Rectal exam: Patient has  mildly bleeding abrasions around the rectum, no visible external hemorrhoids noted, patient reports tenderness to palpation around fissures  ASSESSMENT/PLAN:   Diverticulitis Patient recently seen for issues with diverticulitis and was given Cipro Flagyl which she completed on 7/15.  Symptoms are not worsening but patient reports having very loose stools.  Describes them as not watery but very loose.  Is taking Colace for which she was prescribed twice daily but has been taking it 3 times daily.  Patient is also complaining of rectal pain with blood occasionally on his stool and tissue paper.  Rectal exam was significant for abrasions/excoriations on the rectum.  These were oozing on exam.  Patient denies any fever or chills. -Handout provided describing sitz bath. -Recommended Colace at prescribed dose or he may transition to MiraLAX if he prefers -Patient requests prescription for preparation H but then says he will pick it up over-the-counter -Patient requesting pain medication refill.  I will prescribe 10 doses of tramadol which he was previously on but he has been instructed he must follow-up with his pain medicine specialist for chronic pain medications -Referral placed to assist with possibly transitioning to new pain medicine specialist -Strict return precautions given     8/15, MD [facility]

## 2020-06-28 NOTE — Patient Instructions (Addendum)
It was a pleasure to meet you today.  I am sorry you are having these issues with stools.  On your exam I noticed some tears around your rectum which is where the blood is most likely coming from.  Your hemoglobin at last check was 14.2.  Today it was 14.9.  I am going to recommend you use sits baths to help with the pain relief in your rectum.  Regarding pain management you will need to see a pain management specialist.  I will give you a short course of pain medications until you can make an appointment with your pain management specialist.  This will be the last short course that we will prescribe you if you are seeing a pain management specialist.  If you have any questions or concerns please feel free to call our clinic.  I hope you have a wonderful afternoon!   How to Take a [employer] A sitz bath is a warm water bath that may be used to care for your rectum, genital area, or the area between your rectum and genitals (perineum). For a sitz bath, the water only comes up to your hips and covers your buttocks. A sitz bath may done at home in a bathtub or with a portable sitz bath that fits over the toilet. Your health care provider may recommend a sitz bath to help:  Relieve pain and discomfort after delivering a baby.  Relieve pain and itching from hemorrhoids or anal fissures.  Relieve pain after certain surgeries.  Relax muscles that are sore or tight. How to take a sitz bath Take 3-4 sitz baths a day, or as many as told by your health care provider. Bathtub sitz bath To take a sitz bath in a bathtub: 1. Partially fill a bathtub with warm water. The water should be deep enough to cover your hips and buttocks when you are sitting in the tub. 2. If your health care provider told you to put medicine in the water, follow his or her instructions. 3. Sit in the water. 4. Open the tub drain a little, and leave it open during your bath. 5. Turn on the warm water again, enough to replace the water  that is draining out. Keep the water running throughout your bath. This helps keep the water at the right level and the right temperature. 6. Soak in the water for 15-20 minutes, or as long as told by your health care provider. 7. When you are done, be careful when you stand up. You may feel dizzy. 8. After the sitz bath, pat yourself dry. Do not rub your skin to dry it.  Over-the-toilet sitz bath To take a sitz bath with an over-the-toilet basin: 1. Follow the manufacturer's instructions. 2. Fill the basin with warm water. 3. If your health care provider told you to put medicine in the water, follow his or her instructions. 4. Sit on the seat. Make sure the water covers your buttocks and perineum. 5. Soak in the water for 15-20 minutes, or as long as told by your health care provider. 6. After the sitz bath, pat yourself dry. Do not rub your skin to dry it. 7. Clean and dry the basin between uses. 8. Discard the basin if it cracks, or according to the manufacturer's instructions. Contact a health care provider if:  Your symptoms get worse. Do not continue with sitz baths if your symptoms get worse.  You have new symptoms. If this happens, do not continue with sitz  baths until you talk with your health care provider. Summary  A sitz bath is a warm water bath in which the water only comes up to your hips and covers your buttocks.  A sitz bath may help relieve itching, relieve pain, and relax muscles that are sore or tight in the lower part of your body, including your genital area.  Take 3-4 sitz baths a day, or as many as told by your health care provider. Soak in the water for 15-20 minutes.  Do not continue with sitz baths if your symptoms get worse. This information is not intended to replace advice given to you by your health care provider. Make sure you discuss any questions you have with your health care provider. Document Revised: 04/27/2019 Document Reviewed:  11/28/2017 Elsevier Patient Education  2020 [employer].   Diverticulitis  Diverticulitis is when small pockets in your large intestine (colon) get infected or swollen. This causes stomach pain and watery poop (diarrhea). These pouches are called diverticula. They form in people who have a condition called diverticulosis. Follow these instructions at home: Medicines  Take over-the-counter and prescription medicines only as told by your doctor. These include: ? Antibiotics. ? Pain medicines. ? Fiber pills. ? Probiotics. ? Stool softeners.  Do not drive or use heavy machinery while taking prescription pain medicine.  If you were prescribed an antibiotic, take it as told. Do not stop taking it even if you feel better. General instructions   Follow a diet as told by your doctor.  When you feel better, your doctor may tell you to change your diet. You may need to eat a lot of fiber. Fiber makes it easier to poop (have bowel movements). Healthy foods with fiber include: ? Berries. ? Beans. ? Lentils. ? Green vegetables.  Exercise 3 or more times a week. Aim for 30 minutes each time. Exercise enough to sweat and make your heart beat faster.  Keep all follow-up visits as told. This is important. You may need to have an exam of the large intestine. This is called a colonoscopy. Contact a doctor if:  Your pain does not get better.  You have a hard time eating or drinking.  You are not pooping like normal. Get help right away if:  Your pain gets worse.  Your problems do not get better.  Your problems get worse very fast.  You have a fever.  You throw up (vomit) more than one time.  You have poop that is: ? Bloody. ? Black. ? Tarry. Summary  Diverticulitis is when small pockets in your large intestine (colon) get infected or swollen.  Take medicines only as told by your doctor.  Follow a diet as told by your doctor. This information is not intended to replace  advice given to you by your health care provider. Make sure you discuss any questions you have with your health care provider. Document Revised: 11/08/2017 Document Reviewed: 12/13/2016 Elsevier Patient Education  2020 [employer].

## 2020-06-29 ENCOUNTER — Other Ambulatory Visit: Payer: Self-pay | Admitting: Family Medicine

## 2020-06-29 ENCOUNTER — Encounter (HOSPITAL_COMMUNITY): Payer: Self-pay

## 2020-06-29 ENCOUNTER — Ambulatory Visit (HOSPITAL_COMMUNITY)
Admission: EM | Admit: 2020-06-29 | Discharge: 2020-06-29 | Disposition: A | Discharge status: Home / Self Care | Payer: Medicare Other | Attending: Emergency Medicine | Admitting: Emergency Medicine

## 2020-06-29 DIAGNOSIS — R1013 Epigastric pain: Secondary | ICD-10-CM | POA: Diagnosis not present

## 2020-06-29 LAB — CBC WITH DIFFERENTIAL/PLATELET
Abs Immature Granulocytes: 0.01 10*3/uL (ref 0.00–0.07)
Basophils Absolute: 0.1 10*3/uL (ref 0.0–0.1)
Basophils Relative: 1 %
Eosinophils Absolute: 0.2 10*3/uL (ref 0.0–0.5)
Eosinophils Relative: 3 %
HCT: 43.8 % (ref 39.0–52.0)
Hemoglobin: 14.5 g/dL (ref 13.0–17.0)
Immature Granulocytes: 0 %
Lymphocytes Relative: 32 %
Lymphs Abs: 1.8 10*3/uL (ref 0.7–4.0)
MCH: 27.2 pg (ref 26.0–34.0)
MCHC: 33.1 g/dL (ref 30.0–36.0)
MCV: 82 fL (ref 80.0–100.0)
Monocytes Absolute: 0.6 10*3/uL (ref 0.1–1.0)
Monocytes Relative: 10 %
Neutro Abs: 3.1 10*3/uL (ref 1.7–7.7)
Neutrophils Relative %: 54 %
Platelets: 382 10*3/uL (ref 150–400)
RBC: 5.34 MIL/uL (ref 4.22–5.81)
RDW: 14.4 % (ref 11.5–15.5)
WBC: 5.8 10*3/uL (ref 4.0–10.5)
nRBC: 0 % (ref 0.0–0.2)

## 2020-06-29 MED ORDER — ALUM & MAG HYDROXIDE-SIMETH 200-200-20 MG/5ML PO SUSP
ORAL | Status: AC
  Filled 2020-06-29: qty 30

## 2020-06-29 MED ORDER — TRAMADOL HCL 100 MG PO TABS: 100.0000 mg | ORAL_TABLET | Freq: Every day | ORAL | 0 refills | Status: DC | PRN

## 2020-06-29 MED ORDER — ALUM & MAG HYDROXIDE-SIMETH 200-200-20 MG/5ML PO SUSP
30.0000 mL | Freq: Once | ORAL | Status: AC
  Administered 2020-06-29: 30 mL via ORAL

## 2020-06-29 MED ORDER — LIDOCAINE VISCOUS HCL 2 % MT SOLN
15.0000 mL | Freq: Once | OROMUCOSAL | Status: AC
  Administered 2020-06-29: 15 mL via ORAL

## 2020-06-29 MED ORDER — SUCRALFATE 1 G PO TABS
1.0000 g | ORAL_TABLET | Freq: Three times a day (TID) | ORAL | 0 refills | Status: DC
Start: 2020-06-29 — End: 2020-08-01

## 2020-06-29 MED ORDER — OMEPRAZOLE 20 MG PO CPDR: 20.0000 mg | DELAYED_RELEASE_CAPSULE | Freq: Every day | ORAL | 0 refills | Status: DC

## 2020-06-29 MED ORDER — LIDOCAINE VISCOUS HCL 2 % MT SOLN
OROMUCOSAL | Status: AC
  Filled 2020-06-29: qty 15

## 2020-06-29 NOTE — ED Triage Notes (Addendum)
Pt presents to UC for right sided upper abdominal pain x1 week. Pt endorsing nausea, with no emesis, and diarrhea. Pt denies fever/chills/body aches. Pt denies OTC treatments or relieving factors.

## 2020-06-29 NOTE — Discharge Instructions (Signed)
Your CT scan on 7/10 showed improvement of your diverticulitis.  I do question if reflux is contributing to your symptoms, I would like you to restart daily omeprazole as well as carafate with meals.  I am drawing blood to compare your white blood cell count as well. I would call you if this returns concerning.  Please continue to follow with your pain management doctor in regards to your tramadol for back pain.  Please go to the ER for any worsening of abdominal pain, blood or black in your stools, fevers, dizziness, or otherwise worsening.

## 2020-06-29 NOTE — Progress Notes (Deleted)
    SUBJECTIVE:   CHIEF COMPLAINT / HPI:   ***  PERTINENT  PMH / PSH: ***  OBJECTIVE:   There were no vitals taken for this visit.  ***  ASSESSMENT/PLAN:   No problem-specific Assessment & Plan notes found for this encounter.     Memory Heinrichs, MD [facility]  
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
## 2020-06-30 ENCOUNTER — Ambulatory Visit: Payer: Medicare Other

## 2020-06-30 DIAGNOSIS — M9902 Segmental and somatic dysfunction of thoracic region: Secondary | ICD-10-CM | POA: Diagnosis not present

## 2020-06-30 DIAGNOSIS — M5136 Other intervertebral disc degeneration, lumbar region: Secondary | ICD-10-CM | POA: Diagnosis not present

## 2020-06-30 DIAGNOSIS — M9903 Segmental and somatic dysfunction of lumbar region: Secondary | ICD-10-CM | POA: Diagnosis not present

## 2020-06-30 DIAGNOSIS — M5134 Other intervertebral disc degeneration, thoracic region: Secondary | ICD-10-CM | POA: Diagnosis not present

## 2020-07-02 DIAGNOSIS — Z79899 Other long term (current) drug therapy: Secondary | ICD-10-CM | POA: Diagnosis not present

## 2020-07-02 DIAGNOSIS — R52 Pain, unspecified: Secondary | ICD-10-CM | POA: Diagnosis not present

## 2020-07-05 DIAGNOSIS — M5134 Other intervertebral disc degeneration, thoracic region: Secondary | ICD-10-CM | POA: Diagnosis not present

## 2020-07-05 DIAGNOSIS — M9903 Segmental and somatic dysfunction of lumbar region: Secondary | ICD-10-CM | POA: Diagnosis not present

## 2020-07-05 DIAGNOSIS — M5136 Other intervertebral disc degeneration, lumbar region: Secondary | ICD-10-CM | POA: Diagnosis not present

## 2020-07-05 DIAGNOSIS — M9902 Segmental and somatic dysfunction of thoracic region: Secondary | ICD-10-CM | POA: Diagnosis not present

## 2020-07-07 DIAGNOSIS — J302 Other seasonal allergic rhinitis: Secondary | ICD-10-CM | POA: Diagnosis not present

## 2020-07-07 DIAGNOSIS — G8929 Other chronic pain: Secondary | ICD-10-CM | POA: Diagnosis not present

## 2020-07-07 DIAGNOSIS — Z79899 Other long term (current) drug therapy: Secondary | ICD-10-CM | POA: Diagnosis not present

## 2020-07-07 DIAGNOSIS — K5909 Other constipation: Secondary | ICD-10-CM | POA: Diagnosis not present

## 2020-07-07 DIAGNOSIS — M545 Low back pain: Secondary | ICD-10-CM | POA: Diagnosis not present

## 2020-07-14 DIAGNOSIS — M9902 Segmental and somatic dysfunction of thoracic region: Secondary | ICD-10-CM | POA: Diagnosis not present

## 2020-07-14 DIAGNOSIS — M5134 Other intervertebral disc degeneration, thoracic region: Secondary | ICD-10-CM | POA: Diagnosis not present

## 2020-07-14 DIAGNOSIS — M9903 Segmental and somatic dysfunction of lumbar region: Secondary | ICD-10-CM | POA: Diagnosis not present

## 2020-07-14 DIAGNOSIS — M5136 Other intervertebral disc degeneration, lumbar region: Secondary | ICD-10-CM | POA: Diagnosis not present

## 2020-07-27 DIAGNOSIS — R1319 Other dysphagia: Secondary | ICD-10-CM | POA: Diagnosis not present

## 2020-07-27 DIAGNOSIS — R52 Pain, unspecified: Secondary | ICD-10-CM | POA: Diagnosis not present

## 2020-07-27 DIAGNOSIS — Z01818 Encounter for other preprocedural examination: Secondary | ICD-10-CM | POA: Diagnosis not present

## 2020-07-27 DIAGNOSIS — K5792 Diverticulitis of intestine, part unspecified, without perforation or abscess without bleeding: Secondary | ICD-10-CM | POA: Diagnosis not present

## 2020-07-27 DIAGNOSIS — K5909 Other constipation: Secondary | ICD-10-CM | POA: Diagnosis not present

## 2020-08-01 ENCOUNTER — Other Ambulatory Visit: Payer: Self-pay

## 2020-08-01 ENCOUNTER — Encounter: Payer: Self-pay | Admitting: Family Medicine

## 2020-08-01 ENCOUNTER — Ambulatory Visit (INDEPENDENT_AMBULATORY_CARE_PROVIDER_SITE_OTHER): Payer: Medicare Other | Admitting: Family Medicine

## 2020-08-01 VITALS — BP 160/70 | HR 107 | Ht 74.0 in | Wt 226.5 lb

## 2020-08-01 DIAGNOSIS — J309 Allergic rhinitis, unspecified: Secondary | ICD-10-CM | POA: Diagnosis not present

## 2020-08-01 DIAGNOSIS — E119 Type 2 diabetes mellitus without complications: Secondary | ICD-10-CM

## 2020-08-01 DIAGNOSIS — R9341 Abnormal radiologic findings on diagnostic imaging of renal pelvis, ureter, or bladder: Secondary | ICD-10-CM

## 2020-08-01 DIAGNOSIS — N3289 Other specified disorders of bladder: Secondary | ICD-10-CM

## 2020-08-01 DIAGNOSIS — I10 Essential (primary) hypertension: Secondary | ICD-10-CM | POA: Diagnosis not present

## 2020-08-01 LAB — POCT URINALYSIS DIP (MANUAL ENTRY)
Bilirubin, UA: NEGATIVE
Blood, UA: NEGATIVE
Glucose, UA: NEGATIVE mg/dL
Ketones, POC UA: NEGATIVE mg/dL
Leukocytes, UA: NEGATIVE
Nitrite, UA: NEGATIVE
Protein Ur, POC: NEGATIVE mg/dL
Spec Grav, UA: 1.02 (ref 1.010–1.025)
Urobilinogen, UA: 0.2 E.U./dL
pH, UA: 6.5 (ref 5.0–8.0)

## 2020-08-01 LAB — POCT UA - MICROALBUMIN
Albumin/Creatinine Ratio, Urine, POC: <30
Creatinine, POC: 300 mg/dL
Microalbumin Ur, POC: 30 mg/L

## 2020-08-01 LAB — POCT GLYCOSYLATED HEMOGLOBIN (HGB A1C): HbA1c POC (<> result, manual entry): 7.3 % (ref 4.0–5.6)

## 2020-08-01 MED ORDER — FLUTICASONE PROPIONATE 50 MCG/ACT NA SUSP: 2.0000 | Freq: Every day | NASAL | 3 refills | Status: DC

## 2020-08-01 MED ORDER — METFORMIN HCL 1000 MG PO TABS: 1000.0000 mg | ORAL_TABLET | Freq: Two times a day (BID) | ORAL | 2 refills | Status: DC

## 2020-08-01 MED ORDER — ATORVASTATIN CALCIUM 20 MG PO TABS
20.0000 mg | ORAL_TABLET | Freq: Every day | ORAL | 3 refills | Status: DC
Start: 2020-08-01 — End: 2024-04-21

## 2020-08-01 MED ORDER — DILTIAZEM HCL ER COATED BEADS 120 MG PO CP24
120.0000 mg | ORAL_CAPSULE | Freq: Every day | ORAL | 2 refills | Status: DC
Start: 2020-08-01 — End: 2021-01-19

## 2020-08-01 MED ORDER — METHOCARBAMOL 500 MG PO TABS: 500.0000 mg | ORAL_TABLET | Freq: Two times a day (BID) | ORAL | 1 refills | Status: DC | PRN

## 2020-08-01 NOTE — Patient Instructions (Addendum)
Diabetes: Your A1c is pretty well controlled today.  We will not make any medication changes for that.  Cholesterol: It is recommended that everyone over 40 with diabetes the anticholesterol medication. We are going to start you on atorvastatin 20 mg today. We may go up on this medicine in the future.  Blood pressure: Blood pressure is elevated today and he would probably benefit from additional medication help protect her kidneys. Going to start you on a medication called diltiazem today. Please be mindful of any lightheadedness or dizziness with moving from a seated to standing position.  Bladder findings: On your recent CT, there was notable abnormalities to your bladder. It was recommended that you follow-up with urology for cystoscopy. I placed a referral. You should get a call in the next week or 2 about this.  Follow-up in 3 months for diabetes and hypertension.

## 2020-08-01 NOTE — Assessment & Plan Note (Signed)
Abdominal CT on 06/18/2020 showed asymmetric bladder wall thickening and recommended follow-up with urology for cystoscopy.  UA today shows no evidence of blood.  He is already set up with appropriate follow-up with urology.

## 2020-08-01 NOTE — Assessment & Plan Note (Signed)
Poorly controlled.  Currently on amlodipine 10 mg daily.  Poor candidate for ACE/ARB therapy.  We will start diltiazem today for potential renal protective benefit. -Continue amlodipine 10 mg daily -Start diltiazem 120 mg daily

## 2020-08-01 NOTE — Assessment & Plan Note (Addendum)
Mistakenly measured A1c today.  A1c does seem to be improving compared to 1 month ago.  No changes made in his diabetic medication today. -Continue Metformin 1000 mg twice daily -Foot exam performed today -Follow-up microalbumin  He was encouraged to start on a statin for the cardiovascular benefit.  Was agreeable with this plan and aware of the potential side effects. -Started atorvastatin 20 mg -Consider increase to 40 mg in the future

## 2020-08-01 NOTE — Progress Notes (Signed)
    SUBJECTIVE:   CHIEF COMPLAINT / HPI:   Diabetes A1c 7.3 today. Medication includes Metformin 1000 mg twice daily. Not currently on a statin medication. Not currently on ACE/ARB due to previous angioedema on enalapril. Does not currently check fasting blood glucose.  Hypertension He currently takes amlodipine 10 mg daily.  He does not miss doses.  PERTINENT  PMH / PSH: Hypertension, diabetes,  OBJECTIVE:   BP (!) 160/70 Comment: PROVIDER INFORMED  Pulse (!) 107 Comment: PROVIDER INFORMED  Ht 6\' 2"  (1.88 m)   Wt 226 lb 8 oz (102.7 kg)   SpO2 96%   BMI 29.08 kg/m    General: Well-appearing 74 year old gentleman seated comfortably in the exam chair.  No acute distress. Respiratory: Breathing comfortably on room air.  No respiratory distress.  Diabetic Foot Exam - Simple   Simple Foot Form Diabetic Foot exam was performed with the following findings: Yes 08/01/2020  6:47 PM  Visual Inspection No deformities, no ulcerations, no other skin breakdown bilaterally: Yes Sensation Testing Intact to touch and monofilament testing bilaterally: Yes Pulse Check Posterior Tibialis and Dorsalis pulse intact bilaterally: Yes Comments      ASSESSMENT/PLAN:   HYPERTENSION, BENIGN SYSTEMIC Poorly controlled.  Currently on amlodipine 10 mg daily.  Poor candidate for ACE/ARB therapy.  We will start diltiazem today for potential renal protective benefit. -Continue amlodipine 10 mg daily -Start diltiazem 120 mg daily  DM type 2 (diabetes mellitus, type 2) Mistakenly measured A1c today.  A1c does seem to be improving compared to 1 month ago.  No changes made in his diabetic medication today. -Continue Metformin 1000 mg twice daily -Foot exam performed today -Follow-up microalbumin  He was encouraged to start on a statin for the cardiovascular benefit.  Was agreeable with this plan and aware of the potential side effects. -Started atorvastatin 20 mg -Consider increase to 40 mg in  the future  Bladder wall thickening Abdominal CT on 06/18/2020 showed asymmetric bladder wall thickening and recommended follow-up with urology for cystoscopy.  UA today shows no evidence of blood.  He is already set up with appropriate follow-up with urology.     08/19/2020, MD [facility]

## 2020-08-02 DIAGNOSIS — R52 Pain, unspecified: Secondary | ICD-10-CM | POA: Diagnosis not present

## 2020-08-02 DIAGNOSIS — Z79899 Other long term (current) drug therapy: Secondary | ICD-10-CM | POA: Diagnosis not present

## 2020-08-05 DIAGNOSIS — Z1211 Encounter for screening for malignant neoplasm of colon: Secondary | ICD-10-CM | POA: Diagnosis not present

## 2020-08-05 DIAGNOSIS — Z01818 Encounter for other preprocedural examination: Secondary | ICD-10-CM | POA: Diagnosis not present

## 2020-08-05 DIAGNOSIS — E119 Type 2 diabetes mellitus without complications: Secondary | ICD-10-CM | POA: Diagnosis not present

## 2020-08-05 DIAGNOSIS — K200 Eosinophilic esophagitis: Secondary | ICD-10-CM | POA: Diagnosis not present

## 2020-08-05 DIAGNOSIS — R1310 Dysphagia, unspecified: Secondary | ICD-10-CM | POA: Diagnosis not present

## 2020-08-05 DIAGNOSIS — R1319 Other dysphagia: Secondary | ICD-10-CM | POA: Diagnosis not present

## 2020-08-16 DIAGNOSIS — M9902 Segmental and somatic dysfunction of thoracic region: Secondary | ICD-10-CM | POA: Diagnosis not present

## 2020-08-16 DIAGNOSIS — M5136 Other intervertebral disc degeneration, lumbar region: Secondary | ICD-10-CM | POA: Diagnosis not present

## 2020-08-16 DIAGNOSIS — M9903 Segmental and somatic dysfunction of lumbar region: Secondary | ICD-10-CM | POA: Diagnosis not present

## 2020-08-16 DIAGNOSIS — M5134 Other intervertebral disc degeneration, thoracic region: Secondary | ICD-10-CM | POA: Diagnosis not present

## 2020-08-18 DIAGNOSIS — M9903 Segmental and somatic dysfunction of lumbar region: Secondary | ICD-10-CM | POA: Diagnosis not present

## 2020-08-18 DIAGNOSIS — M5136 Other intervertebral disc degeneration, lumbar region: Secondary | ICD-10-CM | POA: Diagnosis not present

## 2020-08-18 DIAGNOSIS — M9902 Segmental and somatic dysfunction of thoracic region: Secondary | ICD-10-CM | POA: Diagnosis not present

## 2020-08-18 DIAGNOSIS — M5134 Other intervertebral disc degeneration, thoracic region: Secondary | ICD-10-CM | POA: Diagnosis not present

## 2020-08-23 DIAGNOSIS — M9904 Segmental and somatic dysfunction of sacral region: Secondary | ICD-10-CM | POA: Diagnosis not present

## 2020-08-23 DIAGNOSIS — M9905 Segmental and somatic dysfunction of pelvic region: Secondary | ICD-10-CM | POA: Diagnosis not present

## 2020-08-23 DIAGNOSIS — M9903 Segmental and somatic dysfunction of lumbar region: Secondary | ICD-10-CM | POA: Diagnosis not present

## 2020-08-23 DIAGNOSIS — M545 Low back pain: Secondary | ICD-10-CM | POA: Diagnosis not present

## 2020-08-25 DIAGNOSIS — M545 Low back pain: Secondary | ICD-10-CM | POA: Diagnosis not present

## 2020-08-25 DIAGNOSIS — M9903 Segmental and somatic dysfunction of lumbar region: Secondary | ICD-10-CM | POA: Diagnosis not present

## 2020-08-25 DIAGNOSIS — M9905 Segmental and somatic dysfunction of pelvic region: Secondary | ICD-10-CM | POA: Diagnosis not present

## 2020-08-25 DIAGNOSIS — M9904 Segmental and somatic dysfunction of sacral region: Secondary | ICD-10-CM | POA: Diagnosis not present

## 2020-08-26 DIAGNOSIS — R1013 Epigastric pain: Secondary | ICD-10-CM | POA: Diagnosis not present

## 2020-08-26 DIAGNOSIS — K529 Noninfective gastroenteritis and colitis, unspecified: Secondary | ICD-10-CM | POA: Diagnosis not present

## 2020-08-26 DIAGNOSIS — Z79899 Other long term (current) drug therapy: Secondary | ICD-10-CM | POA: Diagnosis not present

## 2020-08-30 DIAGNOSIS — M545 Low back pain: Secondary | ICD-10-CM | POA: Diagnosis not present

## 2020-08-30 DIAGNOSIS — M9903 Segmental and somatic dysfunction of lumbar region: Secondary | ICD-10-CM | POA: Diagnosis not present

## 2020-08-30 DIAGNOSIS — M9905 Segmental and somatic dysfunction of pelvic region: Secondary | ICD-10-CM | POA: Diagnosis not present

## 2020-08-30 DIAGNOSIS — M9904 Segmental and somatic dysfunction of sacral region: Secondary | ICD-10-CM | POA: Diagnosis not present

## 2020-09-01 DIAGNOSIS — M545 Low back pain: Secondary | ICD-10-CM | POA: Diagnosis not present

## 2020-09-01 DIAGNOSIS — M9904 Segmental and somatic dysfunction of sacral region: Secondary | ICD-10-CM | POA: Diagnosis not present

## 2020-09-01 DIAGNOSIS — M9905 Segmental and somatic dysfunction of pelvic region: Secondary | ICD-10-CM | POA: Diagnosis not present

## 2020-09-01 DIAGNOSIS — M9903 Segmental and somatic dysfunction of lumbar region: Secondary | ICD-10-CM | POA: Diagnosis not present

## 2020-09-02 DIAGNOSIS — R52 Pain, unspecified: Secondary | ICD-10-CM | POA: Diagnosis not present

## 2020-09-02 DIAGNOSIS — Z79899 Other long term (current) drug therapy: Secondary | ICD-10-CM | POA: Diagnosis not present

## 2020-09-06 DIAGNOSIS — M9904 Segmental and somatic dysfunction of sacral region: Secondary | ICD-10-CM | POA: Diagnosis not present

## 2020-09-06 DIAGNOSIS — M545 Low back pain: Secondary | ICD-10-CM | POA: Diagnosis not present

## 2020-09-06 DIAGNOSIS — M9905 Segmental and somatic dysfunction of pelvic region: Secondary | ICD-10-CM | POA: Diagnosis not present

## 2020-09-06 DIAGNOSIS — M9903 Segmental and somatic dysfunction of lumbar region: Secondary | ICD-10-CM | POA: Diagnosis not present

## 2020-09-07 DIAGNOSIS — R1319 Other dysphagia: Secondary | ICD-10-CM | POA: Diagnosis not present

## 2020-09-07 DIAGNOSIS — R1030 Lower abdominal pain, unspecified: Secondary | ICD-10-CM | POA: Diagnosis not present

## 2020-09-08 DIAGNOSIS — M545 Low back pain: Secondary | ICD-10-CM | POA: Diagnosis not present

## 2020-09-08 DIAGNOSIS — M9903 Segmental and somatic dysfunction of lumbar region: Secondary | ICD-10-CM | POA: Diagnosis not present

## 2020-09-08 DIAGNOSIS — M9905 Segmental and somatic dysfunction of pelvic region: Secondary | ICD-10-CM | POA: Diagnosis not present

## 2020-09-08 DIAGNOSIS — M9904 Segmental and somatic dysfunction of sacral region: Secondary | ICD-10-CM | POA: Diagnosis not present

## 2020-09-14 DIAGNOSIS — M545 Low back pain, unspecified: Secondary | ICD-10-CM | POA: Diagnosis not present

## 2020-09-14 DIAGNOSIS — M9904 Segmental and somatic dysfunction of sacral region: Secondary | ICD-10-CM | POA: Diagnosis not present

## 2020-09-14 DIAGNOSIS — M9903 Segmental and somatic dysfunction of lumbar region: Secondary | ICD-10-CM | POA: Diagnosis not present

## 2020-09-14 DIAGNOSIS — M9905 Segmental and somatic dysfunction of pelvic region: Secondary | ICD-10-CM | POA: Diagnosis not present

## 2020-09-15 DIAGNOSIS — Z87891 Personal history of nicotine dependence: Secondary | ICD-10-CM | POA: Diagnosis not present

## 2020-09-15 DIAGNOSIS — R1030 Lower abdominal pain, unspecified: Secondary | ICD-10-CM | POA: Diagnosis not present

## 2020-09-20 DIAGNOSIS — M9904 Segmental and somatic dysfunction of sacral region: Secondary | ICD-10-CM | POA: Diagnosis not present

## 2020-09-20 DIAGNOSIS — M9905 Segmental and somatic dysfunction of pelvic region: Secondary | ICD-10-CM | POA: Diagnosis not present

## 2020-09-20 DIAGNOSIS — M545 Low back pain, unspecified: Secondary | ICD-10-CM | POA: Diagnosis not present

## 2020-09-20 DIAGNOSIS — M9903 Segmental and somatic dysfunction of lumbar region: Secondary | ICD-10-CM | POA: Diagnosis not present

## 2020-09-22 DIAGNOSIS — M9903 Segmental and somatic dysfunction of lumbar region: Secondary | ICD-10-CM | POA: Diagnosis not present

## 2020-09-22 DIAGNOSIS — M9905 Segmental and somatic dysfunction of pelvic region: Secondary | ICD-10-CM | POA: Diagnosis not present

## 2020-09-22 DIAGNOSIS — M9904 Segmental and somatic dysfunction of sacral region: Secondary | ICD-10-CM | POA: Diagnosis not present

## 2020-09-22 DIAGNOSIS — M545 Low back pain, unspecified: Secondary | ICD-10-CM | POA: Diagnosis not present

## 2020-09-29 DIAGNOSIS — M545 Low back pain, unspecified: Secondary | ICD-10-CM | POA: Diagnosis not present

## 2020-09-29 DIAGNOSIS — M79604 Pain in right leg: Secondary | ICD-10-CM | POA: Diagnosis not present

## 2020-09-29 DIAGNOSIS — G8929 Other chronic pain: Secondary | ICD-10-CM | POA: Diagnosis not present

## 2020-09-29 DIAGNOSIS — I10 Essential (primary) hypertension: Secondary | ICD-10-CM | POA: Diagnosis not present

## 2020-09-29 DIAGNOSIS — Z79899 Other long term (current) drug therapy: Secondary | ICD-10-CM | POA: Diagnosis not present

## 2020-10-02 DIAGNOSIS — Z79899 Other long term (current) drug therapy: Secondary | ICD-10-CM | POA: Diagnosis not present

## 2020-10-02 DIAGNOSIS — R52 Pain, unspecified: Secondary | ICD-10-CM | POA: Diagnosis not present

## 2020-10-04 DIAGNOSIS — M9905 Segmental and somatic dysfunction of pelvic region: Secondary | ICD-10-CM | POA: Diagnosis not present

## 2020-10-04 DIAGNOSIS — M9903 Segmental and somatic dysfunction of lumbar region: Secondary | ICD-10-CM | POA: Diagnosis not present

## 2020-10-04 DIAGNOSIS — M9904 Segmental and somatic dysfunction of sacral region: Secondary | ICD-10-CM | POA: Diagnosis not present

## 2020-10-04 DIAGNOSIS — M545 Low back pain, unspecified: Secondary | ICD-10-CM | POA: Diagnosis not present

## 2020-10-11 DIAGNOSIS — M9905 Segmental and somatic dysfunction of pelvic region: Secondary | ICD-10-CM | POA: Diagnosis not present

## 2020-10-11 DIAGNOSIS — M9903 Segmental and somatic dysfunction of lumbar region: Secondary | ICD-10-CM | POA: Diagnosis not present

## 2020-10-11 DIAGNOSIS — M5136 Other intervertebral disc degeneration, lumbar region: Secondary | ICD-10-CM | POA: Diagnosis not present

## 2020-10-11 DIAGNOSIS — M9904 Segmental and somatic dysfunction of sacral region: Secondary | ICD-10-CM | POA: Diagnosis not present

## 2020-10-13 DIAGNOSIS — M5136 Other intervertebral disc degeneration, lumbar region: Secondary | ICD-10-CM | POA: Diagnosis not present

## 2020-10-13 DIAGNOSIS — M9903 Segmental and somatic dysfunction of lumbar region: Secondary | ICD-10-CM | POA: Diagnosis not present

## 2020-10-13 DIAGNOSIS — M9904 Segmental and somatic dysfunction of sacral region: Secondary | ICD-10-CM | POA: Diagnosis not present

## 2020-10-13 DIAGNOSIS — M9905 Segmental and somatic dysfunction of pelvic region: Secondary | ICD-10-CM | POA: Diagnosis not present

## 2020-10-17 DIAGNOSIS — M9904 Segmental and somatic dysfunction of sacral region: Secondary | ICD-10-CM | POA: Diagnosis not present

## 2020-10-17 DIAGNOSIS — M5136 Other intervertebral disc degeneration, lumbar region: Secondary | ICD-10-CM | POA: Diagnosis not present

## 2020-10-17 DIAGNOSIS — M9905 Segmental and somatic dysfunction of pelvic region: Secondary | ICD-10-CM | POA: Diagnosis not present

## 2020-10-17 DIAGNOSIS — M9903 Segmental and somatic dysfunction of lumbar region: Secondary | ICD-10-CM | POA: Diagnosis not present

## 2020-10-18 DIAGNOSIS — R109 Unspecified abdominal pain: Secondary | ICD-10-CM | POA: Diagnosis not present

## 2020-10-18 DIAGNOSIS — J302 Other seasonal allergic rhinitis: Secondary | ICD-10-CM | POA: Diagnosis not present

## 2020-10-18 DIAGNOSIS — G8929 Other chronic pain: Secondary | ICD-10-CM | POA: Diagnosis not present

## 2020-10-18 DIAGNOSIS — K219 Gastro-esophageal reflux disease without esophagitis: Secondary | ICD-10-CM | POA: Diagnosis not present

## 2020-10-25 DIAGNOSIS — M5136 Other intervertebral disc degeneration, lumbar region: Secondary | ICD-10-CM | POA: Diagnosis not present

## 2020-10-25 DIAGNOSIS — M9903 Segmental and somatic dysfunction of lumbar region: Secondary | ICD-10-CM | POA: Diagnosis not present

## 2020-10-25 DIAGNOSIS — M9904 Segmental and somatic dysfunction of sacral region: Secondary | ICD-10-CM | POA: Diagnosis not present

## 2020-10-25 DIAGNOSIS — M9905 Segmental and somatic dysfunction of pelvic region: Secondary | ICD-10-CM | POA: Diagnosis not present

## 2020-10-26 DIAGNOSIS — M5136 Other intervertebral disc degeneration, lumbar region: Secondary | ICD-10-CM | POA: Diagnosis not present

## 2020-10-26 DIAGNOSIS — M9905 Segmental and somatic dysfunction of pelvic region: Secondary | ICD-10-CM | POA: Diagnosis not present

## 2020-10-26 DIAGNOSIS — M9903 Segmental and somatic dysfunction of lumbar region: Secondary | ICD-10-CM | POA: Diagnosis not present

## 2020-10-26 DIAGNOSIS — M9904 Segmental and somatic dysfunction of sacral region: Secondary | ICD-10-CM | POA: Diagnosis not present

## 2020-11-02 DIAGNOSIS — M9905 Segmental and somatic dysfunction of pelvic region: Secondary | ICD-10-CM | POA: Diagnosis not present

## 2020-11-02 DIAGNOSIS — M9903 Segmental and somatic dysfunction of lumbar region: Secondary | ICD-10-CM | POA: Diagnosis not present

## 2020-11-02 DIAGNOSIS — M5136 Other intervertebral disc degeneration, lumbar region: Secondary | ICD-10-CM | POA: Diagnosis not present

## 2020-11-02 DIAGNOSIS — Z79899 Other long term (current) drug therapy: Secondary | ICD-10-CM | POA: Diagnosis not present

## 2020-11-02 DIAGNOSIS — M9904 Segmental and somatic dysfunction of sacral region: Secondary | ICD-10-CM | POA: Diagnosis not present

## 2020-11-02 DIAGNOSIS — R52 Pain, unspecified: Secondary | ICD-10-CM | POA: Diagnosis not present

## 2020-11-08 DIAGNOSIS — M9905 Segmental and somatic dysfunction of pelvic region: Secondary | ICD-10-CM | POA: Diagnosis not present

## 2020-11-08 DIAGNOSIS — M5136 Other intervertebral disc degeneration, lumbar region: Secondary | ICD-10-CM | POA: Diagnosis not present

## 2020-11-08 DIAGNOSIS — M9903 Segmental and somatic dysfunction of lumbar region: Secondary | ICD-10-CM | POA: Diagnosis not present

## 2020-11-08 DIAGNOSIS — M9904 Segmental and somatic dysfunction of sacral region: Secondary | ICD-10-CM | POA: Diagnosis not present

## 2020-11-09 DIAGNOSIS — M9903 Segmental and somatic dysfunction of lumbar region: Secondary | ICD-10-CM | POA: Diagnosis not present

## 2020-11-09 DIAGNOSIS — M9905 Segmental and somatic dysfunction of pelvic region: Secondary | ICD-10-CM | POA: Diagnosis not present

## 2020-11-09 DIAGNOSIS — M5136 Other intervertebral disc degeneration, lumbar region: Secondary | ICD-10-CM | POA: Diagnosis not present

## 2020-11-09 DIAGNOSIS — M9904 Segmental and somatic dysfunction of sacral region: Secondary | ICD-10-CM | POA: Diagnosis not present

## 2020-11-10 DIAGNOSIS — M79604 Pain in right leg: Secondary | ICD-10-CM | POA: Diagnosis not present

## 2020-11-10 DIAGNOSIS — R0989 Other specified symptoms and signs involving the circulatory and respiratory systems: Secondary | ICD-10-CM | POA: Diagnosis not present

## 2020-11-10 DIAGNOSIS — M79605 Pain in left leg: Secondary | ICD-10-CM | POA: Diagnosis not present

## 2020-11-23 DIAGNOSIS — M9903 Segmental and somatic dysfunction of lumbar region: Secondary | ICD-10-CM | POA: Diagnosis not present

## 2020-11-23 DIAGNOSIS — M9904 Segmental and somatic dysfunction of sacral region: Secondary | ICD-10-CM | POA: Diagnosis not present

## 2020-11-23 DIAGNOSIS — M9905 Segmental and somatic dysfunction of pelvic region: Secondary | ICD-10-CM | POA: Diagnosis not present

## 2020-11-23 DIAGNOSIS — M5136 Other intervertebral disc degeneration, lumbar region: Secondary | ICD-10-CM | POA: Diagnosis not present

## 2020-12-02 DIAGNOSIS — R52 Pain, unspecified: Secondary | ICD-10-CM | POA: Diagnosis not present

## 2020-12-02 DIAGNOSIS — Z79899 Other long term (current) drug therapy: Secondary | ICD-10-CM | POA: Diagnosis not present

## 2021-01-19 ENCOUNTER — Other Ambulatory Visit: Payer: Self-pay | Admitting: *Deleted

## 2021-01-19 MED ORDER — DILTIAZEM HCL ER COATED BEADS 120 MG PO CP24: 120.0000 mg | ORAL_CAPSULE | Freq: Every day | ORAL | 2 refills | Status: DC

## 2021-02-25 ENCOUNTER — Encounter (HOSPITAL_COMMUNITY): Payer: Self-pay | Admitting: Emergency Medicine

## 2021-02-25 ENCOUNTER — Other Ambulatory Visit: Payer: Self-pay

## 2021-02-25 ENCOUNTER — Emergency Department (HOSPITAL_COMMUNITY)
Admission: EM | Admit: 2021-02-25 | Discharge: 2021-02-25 | Disposition: A | Discharge status: Home / Self Care | Payer: Medicare Other | Attending: Emergency Medicine | Admitting: Emergency Medicine

## 2021-02-25 ENCOUNTER — Emergency Department (HOSPITAL_COMMUNITY): Payer: Medicare Other

## 2021-02-25 DIAGNOSIS — Z87891 Personal history of nicotine dependence: Secondary | ICD-10-CM | POA: Diagnosis not present

## 2021-02-25 DIAGNOSIS — K5900 Constipation, unspecified: Secondary | ICD-10-CM | POA: Diagnosis not present

## 2021-02-25 DIAGNOSIS — Z96651 Presence of right artificial knee joint: Secondary | ICD-10-CM | POA: Insufficient documentation

## 2021-02-25 DIAGNOSIS — K219 Gastro-esophageal reflux disease without esophagitis: Secondary | ICD-10-CM | POA: Insufficient documentation

## 2021-02-25 DIAGNOSIS — Z96612 Presence of left artificial shoulder joint: Secondary | ICD-10-CM | POA: Diagnosis not present

## 2021-02-25 DIAGNOSIS — I10 Essential (primary) hypertension: Secondary | ICD-10-CM | POA: Diagnosis not present

## 2021-02-25 DIAGNOSIS — Z79899 Other long term (current) drug therapy: Secondary | ICD-10-CM | POA: Insufficient documentation

## 2021-02-25 DIAGNOSIS — E119 Type 2 diabetes mellitus without complications: Secondary | ICD-10-CM | POA: Insufficient documentation

## 2021-02-25 DIAGNOSIS — Z7982 Long term (current) use of aspirin: Secondary | ICD-10-CM | POA: Diagnosis not present

## 2021-02-25 DIAGNOSIS — Z7984 Long term (current) use of oral hypoglycemic drugs: Secondary | ICD-10-CM | POA: Diagnosis not present

## 2021-02-25 DIAGNOSIS — R1032 Left lower quadrant pain: Secondary | ICD-10-CM | POA: Diagnosis present

## 2021-02-25 LAB — CBC
HCT: 41.7 % (ref 39.0–52.0)
Hemoglobin: 14.1 g/dL (ref 13.0–17.0)
MCH: 27.8 pg (ref 26.0–34.0)
MCHC: 33.8 g/dL (ref 30.0–36.0)
MCV: 82.1 fL (ref 80.0–100.0)
Platelets: 341 10*3/uL (ref 150–400)
RBC: 5.08 MIL/uL (ref 4.22–5.81)
RDW: 14.5 % (ref 11.5–15.5)
WBC: 5.1 10*3/uL (ref 4.0–10.5)
nRBC: 0 % (ref 0.0–0.2)

## 2021-02-25 LAB — LIPASE, BLOOD: Lipase: 36 U/L (ref 11–51)

## 2021-02-25 LAB — COMPREHENSIVE METABOLIC PANEL
ALT: 109 U/L — ABNORMAL HIGH (ref 0–44)
AST: 48 U/L — ABNORMAL HIGH (ref 15–41)
Albumin: 4.2 g/dL (ref 3.5–5.0)
Alkaline Phosphatase: 66 U/L (ref 38–126)
Anion gap: 9 (ref 5–15)
BUN: 7 mg/dL — ABNORMAL LOW (ref 8–23)
CO2: 26 mmol/L (ref 22–32)
Calcium: 9.7 mg/dL (ref 8.9–10.3)
Chloride: 104 mmol/L (ref 98–111)
Creatinine, Ser: 0.96 mg/dL (ref 0.61–1.24)
GFR, Estimated: >60 mL/min (ref >60)
Glucose, Bld: 175 mg/dL — ABNORMAL HIGH (ref 70–99)
Potassium: 4 mmol/L (ref 3.5–5.1)
Sodium: 139 mmol/L (ref 135–145)
Total Bilirubin: 0.9 mg/dL (ref 0.3–1.2)
Total Protein: 7.8 g/dL (ref 6.5–8.1)

## 2021-02-25 MED ORDER — HYDROMORPHONE HCL 1 MG/ML IJ SOLN
0.5000 mg | Freq: Once | INTRAMUSCULAR | Status: AC
  Administered 2021-02-25: 0.5 mg via INTRAVENOUS
  Filled 2021-02-25: qty 1

## 2021-02-25 MED ORDER — MECLIZINE HCL 12.5 MG PO TABS: 12.5000 mg | ORAL_TABLET | Freq: Three times a day (TID) | ORAL | 0 refills | Status: DC | PRN

## 2021-02-25 MED ORDER — POLYETHYLENE GLYCOL 3350 17 G PO PACK: 17.0000 g | PACK | Freq: Every day | ORAL | 0 refills | Status: DC

## 2021-02-25 MED ORDER — IOHEXOL 300 MG/ML  SOLN
100.0000 mL | Freq: Once | INTRAMUSCULAR | Status: AC | PRN
  Administered 2021-02-25: 100 mL via INTRAVENOUS
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
## 2021-02-25 NOTE — ED Triage Notes (Signed)
C/o R sided abd pain, nausea, and constipation x 2 weeks.  Last BM 1 week ago.

## 2021-02-25 NOTE — ED Notes (Signed)
Provided pt with a urinal pt states that he does not need to urinate at this time.

## 2021-02-27 ENCOUNTER — Encounter (HOSPITAL_COMMUNITY): Payer: Self-pay | Admitting: Emergency Medicine

## 2021-02-27 ENCOUNTER — Emergency Department (HOSPITAL_COMMUNITY): Payer: Medicare Other

## 2021-02-27 ENCOUNTER — Emergency Department (HOSPITAL_COMMUNITY)
Admission: EM | Admit: 2021-02-27 | Discharge: 2021-02-27 | Disposition: A | Discharge status: Home / Self Care | Payer: Medicare Other | Attending: Emergency Medicine | Admitting: Emergency Medicine

## 2021-02-27 DIAGNOSIS — Z79899 Other long term (current) drug therapy: Secondary | ICD-10-CM | POA: Diagnosis not present

## 2021-02-27 DIAGNOSIS — R300 Dysuria: Secondary | ICD-10-CM | POA: Insufficient documentation

## 2021-02-27 DIAGNOSIS — Z7984 Long term (current) use of oral hypoglycemic drugs: Secondary | ICD-10-CM | POA: Insufficient documentation

## 2021-02-27 DIAGNOSIS — R319 Hematuria, unspecified: Secondary | ICD-10-CM | POA: Diagnosis not present

## 2021-02-27 DIAGNOSIS — Z96651 Presence of right artificial knee joint: Secondary | ICD-10-CM | POA: Insufficient documentation

## 2021-02-27 DIAGNOSIS — R1031 Right lower quadrant pain: Secondary | ICD-10-CM | POA: Diagnosis present

## 2021-02-27 DIAGNOSIS — Z96612 Presence of left artificial shoulder joint: Secondary | ICD-10-CM | POA: Diagnosis not present

## 2021-02-27 DIAGNOSIS — Z87891 Personal history of nicotine dependence: Secondary | ICD-10-CM | POA: Diagnosis not present

## 2021-02-27 DIAGNOSIS — I10 Essential (primary) hypertension: Secondary | ICD-10-CM | POA: Insufficient documentation

## 2021-02-27 DIAGNOSIS — E119 Type 2 diabetes mellitus without complications: Secondary | ICD-10-CM | POA: Diagnosis not present

## 2021-02-27 DIAGNOSIS — N3090 Cystitis, unspecified without hematuria: Secondary | ICD-10-CM

## 2021-02-27 LAB — URINALYSIS, ROUTINE W REFLEX MICROSCOPIC
Bacteria, UA: NONE SEEN
Bilirubin Urine: NEGATIVE
Glucose, UA: NEGATIVE mg/dL
Hgb urine dipstick: NEGATIVE
Ketones, ur: NEGATIVE mg/dL
Nitrite: NEGATIVE
Protein, ur: 30 mg/dL — AB
Specific Gravity, Urine: 1.032 — ABNORMAL HIGH (ref 1.005–1.030)
pH: 6 (ref 5.0–8.0)

## 2021-02-27 MED ORDER — SODIUM CHLORIDE 0.9 % IV BOLUS
1000.0000 mL | Freq: Once | INTRAVENOUS | Status: AC
  Administered 2021-02-27: 1000 mL via INTRAVENOUS

## 2021-02-27 MED ORDER — CEPHALEXIN 500 MG PO CAPS: 500.0000 mg | ORAL_CAPSULE | Freq: Four times a day (QID) | ORAL | 0 refills | Status: DC
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
## 2021-02-27 NOTE — ED Triage Notes (Signed)
Pt states he was seen here yesterday for bloody urine and abd discomfort. Pt was discharged, returned today for same. A&O x4, VSS, NAD.

## 2021-02-27 NOTE — Discharge Instructions (Signed)
Please take antibiotics as prescribed Call urologist this week for follow-up in the next 2 to 3 weeks. Return the emergency department if you are worsening pain, nausea, vomiting, or fever

## 2021-02-27 NOTE — ED Notes (Signed)
Requisition sent to lab to add urine culture to the urine that is in the lab.  I spoke with Micro and was told it would be added

## 2021-02-27 NOTE — ED Notes (Signed)
Patient transported to CT for renal stone study

## 2021-02-27 NOTE — ED Notes (Signed)
Not able to void right now, MD aware

## 2021-02-28 LAB — URINE CULTURE

## 2021-05-12 ENCOUNTER — Other Ambulatory Visit: Payer: Self-pay | Admitting: Family Medicine

## 2021-05-12 DIAGNOSIS — J309 Allergic rhinitis, unspecified: Secondary | ICD-10-CM

## 2021-05-29 ENCOUNTER — Other Ambulatory Visit: Payer: Self-pay | Admitting: Family Medicine

## 2021-07-08 ENCOUNTER — Other Ambulatory Visit: Payer: Self-pay

## 2021-07-08 ENCOUNTER — Emergency Department (HOSPITAL_COMMUNITY)
Admission: EM | Admit: 2021-07-08 | Discharge: 2021-07-08 | Disposition: A | Discharge status: Home / Self Care | Payer: Medicare Other | Attending: Emergency Medicine | Admitting: Emergency Medicine

## 2021-07-08 ENCOUNTER — Emergency Department (HOSPITAL_COMMUNITY): Payer: Medicare Other

## 2021-07-08 ENCOUNTER — Encounter (HOSPITAL_COMMUNITY): Payer: Self-pay | Admitting: Emergency Medicine

## 2021-07-08 DIAGNOSIS — R300 Dysuria: Secondary | ICD-10-CM | POA: Diagnosis present

## 2021-07-08 DIAGNOSIS — I10 Essential (primary) hypertension: Secondary | ICD-10-CM | POA: Insufficient documentation

## 2021-07-08 DIAGNOSIS — Z87891 Personal history of nicotine dependence: Secondary | ICD-10-CM | POA: Insufficient documentation

## 2021-07-08 DIAGNOSIS — N390 Urinary tract infection, site not specified: Secondary | ICD-10-CM | POA: Diagnosis not present

## 2021-07-08 DIAGNOSIS — B9689 Other specified bacterial agents as the cause of diseases classified elsewhere: Secondary | ICD-10-CM | POA: Diagnosis not present

## 2021-07-08 DIAGNOSIS — Z96651 Presence of right artificial knee joint: Secondary | ICD-10-CM | POA: Diagnosis not present

## 2021-07-08 DIAGNOSIS — Z7984 Long term (current) use of oral hypoglycemic drugs: Secondary | ICD-10-CM | POA: Diagnosis not present

## 2021-07-08 DIAGNOSIS — Z79899 Other long term (current) drug therapy: Secondary | ICD-10-CM | POA: Diagnosis not present

## 2021-07-08 DIAGNOSIS — R140 Abdominal distension (gaseous): Secondary | ICD-10-CM | POA: Diagnosis not present

## 2021-07-08 DIAGNOSIS — Z96612 Presence of left artificial shoulder joint: Secondary | ICD-10-CM | POA: Insufficient documentation

## 2021-07-08 DIAGNOSIS — R109 Unspecified abdominal pain: Secondary | ICD-10-CM | POA: Diagnosis not present

## 2021-07-08 DIAGNOSIS — R110 Nausea: Secondary | ICD-10-CM | POA: Insufficient documentation

## 2021-07-08 DIAGNOSIS — E119 Type 2 diabetes mellitus without complications: Secondary | ICD-10-CM | POA: Diagnosis not present

## 2021-07-08 LAB — URINALYSIS, ROUTINE W REFLEX MICROSCOPIC
Bilirubin Urine: NEGATIVE
Glucose, UA: NEGATIVE mg/dL
Hgb urine dipstick: NEGATIVE
Ketones, ur: NEGATIVE mg/dL
Nitrite: NEGATIVE
Protein, ur: 30 mg/dL — AB
Specific Gravity, Urine: 1.021 (ref 1.005–1.030)
WBC, UA: >50 WBC/hpf — ABNORMAL HIGH (ref 0–5)
pH: 5 (ref 5.0–8.0)

## 2021-07-08 LAB — CBC
HCT: 42.7 % (ref 39.0–52.0)
Hemoglobin: 14 g/dL (ref 13.0–17.0)
MCH: 27 pg (ref 26.0–34.0)
MCHC: 32.8 g/dL (ref 30.0–36.0)
MCV: 82.3 fL (ref 80.0–100.0)
Platelets: 384 10*3/uL (ref 150–400)
RBC: 5.19 MIL/uL (ref 4.22–5.81)
RDW: 13.2 % (ref 11.5–15.5)
WBC: 6.3 10*3/uL (ref 4.0–10.5)
nRBC: 0 % (ref 0.0–0.2)

## 2021-07-08 LAB — LIPASE, BLOOD: Lipase: 42 U/L (ref 11–51)

## 2021-07-08 LAB — COMPREHENSIVE METABOLIC PANEL
ALT: 16 U/L (ref 0–44)
AST: 17 U/L (ref 15–41)
Albumin: 3.7 g/dL (ref 3.5–5.0)
Alkaline Phosphatase: 64 U/L (ref 38–126)
Anion gap: 7 (ref 5–15)
BUN: 6 mg/dL — ABNORMAL LOW (ref 8–23)
CO2: 28 mmol/L (ref 22–32)
Calcium: 9.8 mg/dL (ref 8.9–10.3)
Chloride: 102 mmol/L (ref 98–111)
Creatinine, Ser: 1.03 mg/dL (ref 0.61–1.24)
GFR, Estimated: >60 mL/min (ref >60)
Glucose, Bld: 152 mg/dL — ABNORMAL HIGH (ref 70–99)
Potassium: 4.6 mmol/L (ref 3.5–5.1)
Sodium: 137 mmol/L (ref 135–145)
Total Bilirubin: 0.4 mg/dL (ref 0.3–1.2)
Total Protein: 7.8 g/dL (ref 6.5–8.1)

## 2021-07-08 LAB — TROPONIN I (HIGH SENSITIVITY)
Troponin I (High Sensitivity): 47 ng/L — ABNORMAL HIGH (ref <18)
Troponin I (High Sensitivity): 46 ng/L — ABNORMAL HIGH (ref <18)

## 2021-07-08 MED ORDER — CEPHALEXIN 500 MG PO CAPS: 500.0000 mg | ORAL_CAPSULE | Freq: Three times a day (TID) | ORAL | 0 refills | Status: AC

## 2021-07-08 MED ORDER — IOHEXOL 300 MG/ML  SOLN
80.0000 mL | Freq: Once | INTRAMUSCULAR | Status: AC | PRN
  Administered 2021-07-08: 80 mL via INTRAVENOUS

## 2021-07-08 MED ORDER — MORPHINE SULFATE (PF) 4 MG/ML IV SOLN
4.0000 mg | Freq: Once | INTRAVENOUS | Status: AC
  Administered 2021-07-08: 4 mg via INTRAVENOUS
  Filled 2021-07-08: qty 1

## 2021-07-08 MED ORDER — KETOROLAC TROMETHAMINE 30 MG/ML IJ SOLN
30.0000 mg | Freq: Once | INTRAMUSCULAR | Status: AC
  Administered 2021-07-08: 30 mg via INTRAVENOUS
  Filled 2021-07-08: qty 1

## 2021-07-08 MED ORDER — SODIUM CHLORIDE 0.9 % IV SOLN
1.0000 g | Freq: Once | INTRAVENOUS | Status: AC
  Administered 2021-07-08: 1 g via INTRAVENOUS
  Filled 2021-07-08: qty 10

## 2021-07-08 MED ORDER — ONDANSETRON HCL 4 MG/2ML IJ SOLN
4.0000 mg | Freq: Once | INTRAMUSCULAR | Status: AC
  Administered 2021-07-08: 4 mg via INTRAVENOUS
  Filled 2021-07-08: qty 2
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
## 2021-07-08 NOTE — Discharge Instructions (Addendum)
You have been having several urinary infections recently with trips to the ER.  I would recommend that you call to make an appointment with a urologist.  I gave you the number for [facility] above.  There are specialist who can manage these conditions.  Today you were diagnosed with a urinary tract infection.  We gave you IV antibiotics in the ER.  You will start taking your antibiotic pills tomorrow morning.  These were sent to your pharmacy.  Please take the pills as directed for the full course of treatment.  Do not stop taking them early, even if you feel better.

## 2021-07-08 NOTE — ED Notes (Signed)
Patient transported to CT 

## 2021-07-08 NOTE — ED Notes (Signed)
Pt brought back to triage for repeat Troponin.

## 2021-07-08 NOTE — ED Triage Notes (Signed)
Pt reports pain to center of chest, generalized abd pain, and pain to R flank with nausea and SOB x 2 days.

## 2021-07-10 ENCOUNTER — Telehealth: Payer: Self-pay

## 2021-07-10 NOTE — Telephone Encounter (Signed)
Called number listed for patient but it was a wrong number.   Call to [phone] and the line rang and then went busy.

## 2021-07-11 LAB — URINE CULTURE
Culture: >=100000 — AB
Special Requests: NORMAL

## 2021-07-12 ENCOUNTER — Telehealth: Payer: Self-pay | Admitting: *Deleted

## 2021-07-12 NOTE — Telephone Encounter (Signed)
Post ED Visit - Positive Culture Follow-up: Unsuccessful Patient Follow-up  Culture assessed and recommendations reviewed by:  []  , Pharm.D. []  Enzo Bi, Pharm.D., BCPS AQ-ID []  , Pharm.D., BCPS []  Celedonio Miyamoto, Pharm.D., BCPS []  [place], Garvin Fila.D., BCPS, AAHIVP []  , Pharm.D., BCPS, AAHIVP []  Georgina Pillion, PharmD []  , PharmD, BCPS  Positive urine culture  []  Patient discharged without antimicrobial prescription and treatment is now indicated [x]  Organism is resistant to prescribed ED discharge antimicrobial []  Patient with positive blood cultures  Plan:  Stop Cephalexin, Start Amoxicillin 500mg  PO BID x 7 days, [place], PA-C  Unable to contact patient after 3 attempts, letter will be sent to address on file  [place] 07/12/2021, 9:43 AM

## 2021-07-12 NOTE — Progress Notes (Signed)
ED Antimicrobial Stewardship Positive Culture Follow Up   Brian Dominguez is an 75 y.o. male who presented to [facility] on 07/08/2021 with a chief complaint of  Chief Complaint  Patient presents with   Chest Pain   Abdominal Pain    Recent Results (from the past 720 hour(s))  Urine Culture     Status: Abnormal   Collection Time: 07/08/21  2:16 PM   Specimen: Urine, Clean Catch  Result Value Ref Range Status   Specimen Description URINE, CLEAN CATCH  Final   Special Requests Normal  Final   Culture (A)  Final    >=100,000 COLONIES/mL ENTEROCOCCUS FAECALIS >=100,000 COLONIES/mL AEROCOCCUS SPECIES Standardized susceptibility testing for this organism is not available. Performed at [facility], [address] [place], [place] [address]    Report Status 07/11/2021 FINAL  Final   Organism ID, Bacteria ENTEROCOCCUS FAECALIS (A)  Final      Susceptibility   Enterococcus faecalis - MIC*    AMPICILLIN <=2 SENSITIVE Sensitive     NITROFURANTOIN <=16 SENSITIVE Sensitive     VANCOMYCIN 2 SENSITIVE Sensitive     * >=100,000 COLONIES/mL ENTEROCOCCUS FAECALIS    [x]  Treated with cephalexin, organism resistant to prescribed antimicrobial   New antibiotic prescription:  Amoxicillin 500 mg PO BID x7 days  ED Provider: ,  PA-C   Solon Augusta 07/12/2021, 9:32 AM Clinical Pharmacist Monday - Friday phone -  [phone] Saturday - Sunday phone - [phone]

## 2021-07-18 ENCOUNTER — Telehealth: Payer: Self-pay

## 2021-07-18 NOTE — Telephone Encounter (Signed)
Post ED Visit - Positive Culture Follow-up: Successful Patient Follow-Up  Culture assessed and recommendations reviewed by:  []  , Pharm.D. []  Enzo Bi, Pharm.D., BCPS AQ-ID []  , Pharm.D., BCPS []  Celedonio Miyamoto, Pharm.D., BCPS []  [place], Garvin Fila.D., BCPS, AAHIVP []  , Pharm.D., BCPS, AAHIVP []  Georgina Pillion, PharmD, BCPS []  , PharmD, BCPS []  [place], PharmD, BCPS []  [address], PharmD  Positive urine culture  []  Patient discharged without antimicrobial prescription and treatment is now indicated [x]  Organism is resistant to prescribed ED discharge antimicrobial []  Patient with positive blood cultures  Changes discussed with ED provider: , PA New antibiotic prescription Amoxicillin 500mg  po BID x 7 days.  Called to [facility] on [address] [place], [place]  Contacted patient, date 07/18/2021, time 2:38pm   Phillips Climes 07/18/2021, 2:41 PM

## 2021-07-25 ENCOUNTER — Encounter (HOSPITAL_COMMUNITY): Payer: Self-pay | Admitting: Emergency Medicine

## 2021-07-25 ENCOUNTER — Other Ambulatory Visit: Payer: Self-pay

## 2021-07-25 ENCOUNTER — Emergency Department (HOSPITAL_COMMUNITY): Payer: Medicare Other

## 2021-07-25 ENCOUNTER — Emergency Department (HOSPITAL_COMMUNITY)
Admission: EM | Admit: 2021-07-25 | Discharge: 2021-07-25 | Disposition: A | Discharge status: Home / Self Care | Payer: Medicare Other | Attending: Emergency Medicine | Admitting: Emergency Medicine

## 2021-07-25 DIAGNOSIS — I10 Essential (primary) hypertension: Secondary | ICD-10-CM | POA: Insufficient documentation

## 2021-07-25 DIAGNOSIS — E119 Type 2 diabetes mellitus without complications: Secondary | ICD-10-CM | POA: Diagnosis not present

## 2021-07-25 DIAGNOSIS — R309 Painful micturition, unspecified: Secondary | ICD-10-CM | POA: Insufficient documentation

## 2021-07-25 DIAGNOSIS — R300 Dysuria: Secondary | ICD-10-CM | POA: Diagnosis not present

## 2021-07-25 DIAGNOSIS — K219 Gastro-esophageal reflux disease without esophagitis: Secondary | ICD-10-CM | POA: Insufficient documentation

## 2021-07-25 DIAGNOSIS — R109 Unspecified abdominal pain: Secondary | ICD-10-CM | POA: Insufficient documentation

## 2021-07-25 DIAGNOSIS — Z96612 Presence of left artificial shoulder joint: Secondary | ICD-10-CM | POA: Diagnosis not present

## 2021-07-25 DIAGNOSIS — Z79899 Other long term (current) drug therapy: Secondary | ICD-10-CM | POA: Insufficient documentation

## 2021-07-25 DIAGNOSIS — Z7984 Long term (current) use of oral hypoglycemic drugs: Secondary | ICD-10-CM | POA: Insufficient documentation

## 2021-07-25 DIAGNOSIS — Z96651 Presence of right artificial knee joint: Secondary | ICD-10-CM | POA: Diagnosis not present

## 2021-07-25 DIAGNOSIS — Z87891 Personal history of nicotine dependence: Secondary | ICD-10-CM | POA: Diagnosis not present

## 2021-07-25 DIAGNOSIS — K5900 Constipation, unspecified: Secondary | ICD-10-CM

## 2021-07-25 LAB — COMPREHENSIVE METABOLIC PANEL
ALT: 38 U/L (ref 0–44)
AST: 25 U/L (ref 15–41)
Albumin: 3.9 g/dL (ref 3.5–5.0)
Alkaline Phosphatase: 51 U/L (ref 38–126)
Anion gap: 10 (ref 5–15)
BUN: 13 mg/dL (ref 8–23)
CO2: 24 mmol/L (ref 22–32)
Calcium: 9.5 mg/dL (ref 8.9–10.3)
Chloride: 101 mmol/L (ref 98–111)
Creatinine, Ser: 1.01 mg/dL (ref 0.61–1.24)
GFR, Estimated: >60 mL/min (ref >60)
Glucose, Bld: 168 mg/dL — ABNORMAL HIGH (ref 70–99)
Potassium: 4.2 mmol/L (ref 3.5–5.1)
Sodium: 135 mmol/L (ref 135–145)
Total Bilirubin: 0.8 mg/dL (ref 0.3–1.2)
Total Protein: 7.3 g/dL (ref 6.5–8.1)

## 2021-07-25 LAB — CBC WITH DIFFERENTIAL/PLATELET
Abs Immature Granulocytes: 0.03 10*3/uL (ref 0.00–0.07)
Basophils Absolute: 0 10*3/uL (ref 0.0–0.1)
Basophils Relative: 1 %
Eosinophils Absolute: 0.3 10*3/uL (ref 0.0–0.5)
Eosinophils Relative: 5 %
HCT: 43.1 % (ref 39.0–52.0)
Hemoglobin: 14.3 g/dL (ref 13.0–17.0)
Immature Granulocytes: 1 %
Lymphocytes Relative: 35 %
Lymphs Abs: 2 10*3/uL (ref 0.7–4.0)
MCH: 27.3 pg (ref 26.0–34.0)
MCHC: 33.2 g/dL (ref 30.0–36.0)
MCV: 82.3 fL (ref 80.0–100.0)
Monocytes Absolute: 0.5 10*3/uL (ref 0.1–1.0)
Monocytes Relative: 8 %
Neutro Abs: 2.9 10*3/uL (ref 1.7–7.7)
Neutrophils Relative %: 50 %
Platelets: 264 10*3/uL (ref 150–400)
RBC: 5.24 MIL/uL (ref 4.22–5.81)
RDW: 14.4 % (ref 11.5–15.5)
WBC: 5.8 10*3/uL (ref 4.0–10.5)
nRBC: 0 % (ref 0.0–0.2)

## 2021-07-25 LAB — URINALYSIS, ROUTINE W REFLEX MICROSCOPIC
Bilirubin Urine: NEGATIVE
Glucose, UA: 50 mg/dL — AB
Hgb urine dipstick: NEGATIVE
Ketones, ur: NEGATIVE mg/dL
Leukocytes,Ua: NEGATIVE
Nitrite: NEGATIVE
Protein, ur: NEGATIVE mg/dL
Specific Gravity, Urine: 1.025 (ref 1.005–1.030)
pH: 5 (ref 5.0–8.0)

## 2021-07-25 LAB — LIPASE, BLOOD: Lipase: 55 U/L — ABNORMAL HIGH (ref 11–51)

## 2021-07-25 MED ORDER — DICYCLOMINE HCL 20 MG PO TABS: 20.0000 mg | ORAL_TABLET | Freq: Two times a day (BID) | ORAL | 0 refills | Status: DC

## 2021-07-25 MED ORDER — POLYETHYLENE GLYCOL 3350 17 GM/SCOOP PO POWD: ORAL | 0 refills | Status: DC

## 2021-07-25 MED ORDER — MAGNESIUM CITRATE PO SOLN: 296.0000 mL | Freq: Once | ORAL | 0 refills | Status: AC

## 2021-07-25 MED ORDER — IOHEXOL 350 MG/ML SOLN
75.0000 mL | Freq: Once | INTRAVENOUS | Status: AC | PRN
  Administered 2021-07-25: 75 mL via INTRAVENOUS

## 2021-07-25 MED ORDER — BISACODYL 10 MG RE SUPP: 10.0000 mg | RECTAL | 0 refills | Status: DC | PRN

## 2021-07-25 NOTE — ED Provider Notes (Signed)
75 y/o male here with abdominal pain. Recent UTI on ampicillin. States he has not had a bm in 10 days. Awaiting CT.  Patient CT has resulted and shows constipation. No clinical suspicion for stool impaction.  Will d/c with bowel regimen and bentyl. Discussed return precautions.   Arthor Captain, PA-C 07/25/21 1606    Virgina Norfolk, DO 07/25/21 2321

## 2021-07-25 NOTE — ED Triage Notes (Signed)
Pt reports pain w/ urination. Was treated for UTI, took last antibiotic this morning.  Has some abdominal pain and swelling
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
## 2021-07-25 NOTE — Discharge Instructions (Addendum)
Your CT scan showed that you are constipated and that is the cause of your abdominal pain. I am discharging you with medications to treat your constipation. I would start tonight by taking 5 capfuls of Miralax in 30 oz of juice or water, and drink this evening. IF you do not have a BM by morning, insert 1 dulcolax suppository into your bottom.  This will most likely trigger a bowel movement. If this still does not trigger a BM, then as a last effort, drink a full bottle of magnesium citrate. This can cause liquid diarrhea. You may take Bentyl for stomach cramping.   Be sure to continue taking miralax 1-2 times a day to maintain regularity.  Get help right away if: You have a fever and your symptoms suddenly get worse. You leak stool or have blood in your stool. Your abdomen is bloated. You have severe pain in your abdomen. You feel dizzy or you faint.

## 2021-07-25 NOTE — ED Provider Notes (Signed)
Emergency Medicine Provider Triage Evaluation Note  Brian Dominguez , a 75 y.o. male  was evaluated in triage.  Pt complains of dysuria accompanied by abdominal pain and bloating.  He has been treated for a UTI with ampicillin.  Last dose of antibiotics was this morning.  He reports that his dysuria has not improved.  No vomiting, fever, chills, or hematuria.  Review of Systems  Positive: Dysuria, abdominal pain, bloating Negative: Vomiting, fever, chills, hematuria  Physical Exam  BP 135/76 (BP Location: Right Arm)   Pulse 84   Temp 98.6 F (37 C)   Resp 17   SpO2 96%  Gen:   Awake, no distress   Resp:  Normal effort  MSK:   Moves extremities without difficulty  Other:  Abdomen is not distended  Medical Decision Making  Medically screening exam initiated at 6:42 AM.  Appropriate orders placed.  [name] was informed that the remainder of the evaluation will be completed by another provider, this initial triage assessment does not replace that evaluation, and the importance of remaining in the ED until their evaluation is complete.  Labs and imaging have been ordered.  He will require further work-up and evaluation in the emergency department.   Barkley Boards, PA-C 07/25/21 3536    Glynn Octave, MD 07/25/21 [phone]

## 2021-07-26 LAB — URINE CULTURE: Culture: 80000 — AB

## 2021-07-27 ENCOUNTER — Telehealth: Payer: Self-pay

## 2021-07-27 NOTE — Telephone Encounter (Signed)
Post ED Visit - Positive Culture Follow-up  Culture report reviewed by antimicrobial stewardship pharmacist: [facility] [x]  , Pharm.D. []  Audie Clear, Pharm.D., BCPS AQ-ID []  , Pharm.D., BCPS []  Celedonio Miyamoto, Pharm.D., BCPS []  [place], Garvin Fila.D., BCPS, AAHIVP []  , Pharm.D., BCPS, AAHIVP []  Georgina Pillion, PharmD, BCPS []  , PharmD, BCPS []  [place], PharmD, BCPS []  [address], PharmD []  , PharmD, BCPS []  Estella Husk, PharmD  Pharmacy Team []  Lysle Pearl, PharmD []  , PharmD []  Phillips Climes, PharmD []  , Rph []  Agapito Games) , PharmD []  Verlan Friends, PharmD []  , PharmD []  Mervyn Gay, PharmD []  , PharmD []  Vinnie Level, PharmD []  [facility], PharmD []  , PharmD []  Len Childs, PharmD   Positive urine culture Treated with Amoxicillin, organism sensitive to the same and no further patient follow-up is required at this time.  07/27/2021, 9:53 AM

## 2021-09-11 ENCOUNTER — Encounter (HOSPITAL_COMMUNITY): Payer: Self-pay | Admitting: Radiology

## 2021-09-21 ENCOUNTER — Encounter (HOSPITAL_COMMUNITY): Payer: Self-pay | Admitting: Pharmacy Technician

## 2021-09-21 ENCOUNTER — Emergency Department (HOSPITAL_COMMUNITY): Payer: Medicare Other

## 2021-09-21 ENCOUNTER — Emergency Department (HOSPITAL_COMMUNITY)
Admission: EM | Admit: 2021-09-21 | Discharge: 2021-09-21 | Disposition: A | Discharge status: Home / Self Care | Payer: Medicare Other | Attending: Emergency Medicine | Admitting: Emergency Medicine

## 2021-09-21 ENCOUNTER — Other Ambulatory Visit: Payer: Self-pay

## 2021-09-21 DIAGNOSIS — Z79899 Other long term (current) drug therapy: Secondary | ICD-10-CM | POA: Insufficient documentation

## 2021-09-21 DIAGNOSIS — R110 Nausea: Secondary | ICD-10-CM | POA: Diagnosis not present

## 2021-09-21 DIAGNOSIS — R1013 Epigastric pain: Secondary | ICD-10-CM | POA: Insufficient documentation

## 2021-09-21 DIAGNOSIS — I10 Essential (primary) hypertension: Secondary | ICD-10-CM | POA: Diagnosis not present

## 2021-09-21 DIAGNOSIS — Z96612 Presence of left artificial shoulder joint: Secondary | ICD-10-CM | POA: Diagnosis not present

## 2021-09-21 DIAGNOSIS — E119 Type 2 diabetes mellitus without complications: Secondary | ICD-10-CM | POA: Insufficient documentation

## 2021-09-21 DIAGNOSIS — K219 Gastro-esophageal reflux disease without esophagitis: Secondary | ICD-10-CM | POA: Insufficient documentation

## 2021-09-21 DIAGNOSIS — Z7984 Long term (current) use of oral hypoglycemic drugs: Secondary | ICD-10-CM | POA: Diagnosis not present

## 2021-09-21 DIAGNOSIS — Z96651 Presence of right artificial knee joint: Secondary | ICD-10-CM | POA: Diagnosis not present

## 2021-09-21 DIAGNOSIS — R197 Diarrhea, unspecified: Secondary | ICD-10-CM | POA: Diagnosis not present

## 2021-09-21 LAB — CBC WITH DIFFERENTIAL/PLATELET
Abs Immature Granulocytes: 0.03 10*3/uL (ref 0.00–0.07)
Basophils Absolute: 0 10*3/uL (ref 0.0–0.1)
Basophils Relative: 0 %
Eosinophils Absolute: 0 10*3/uL (ref 0.0–0.5)
Eosinophils Relative: 0 %
HCT: 42.5 % (ref 39.0–52.0)
Hemoglobin: 14.2 g/dL (ref 13.0–17.0)
Immature Granulocytes: 0 %
Lymphocytes Relative: 15 %
Lymphs Abs: 1.3 10*3/uL (ref 0.7–4.0)
MCH: 27.8 pg (ref 26.0–34.0)
MCHC: 33.4 g/dL (ref 30.0–36.0)
MCV: 83.3 fL (ref 80.0–100.0)
Monocytes Absolute: 0.5 10*3/uL (ref 0.1–1.0)
Monocytes Relative: 5 %
Neutro Abs: 6.7 10*3/uL (ref 1.7–7.7)
Neutrophils Relative %: 80 %
Platelets: 339 10*3/uL (ref 150–400)
RBC: 5.1 MIL/uL (ref 4.22–5.81)
RDW: 15.2 % (ref 11.5–15.5)
WBC: 8.4 10*3/uL (ref 4.0–10.5)
nRBC: 0 % (ref 0.0–0.2)

## 2021-09-21 LAB — URINALYSIS, ROUTINE W REFLEX MICROSCOPIC
Bacteria, UA: NONE SEEN
Bilirubin Urine: NEGATIVE
Glucose, UA: >=500 mg/dL — AB
Hgb urine dipstick: NEGATIVE
Ketones, ur: NEGATIVE mg/dL
Leukocytes,Ua: NEGATIVE
Nitrite: NEGATIVE
Protein, ur: 30 mg/dL — AB
Specific Gravity, Urine: 1.035 — ABNORMAL HIGH (ref 1.005–1.030)
pH: 5 (ref 5.0–8.0)

## 2021-09-21 LAB — COMPREHENSIVE METABOLIC PANEL
ALT: 12 U/L (ref 0–44)
AST: 25 U/L (ref 15–41)
Albumin: 4.3 g/dL (ref 3.5–5.0)
Alkaline Phosphatase: 46 U/L (ref 38–126)
Anion gap: 11 (ref 5–15)
BUN: 13 mg/dL (ref 8–23)
CO2: 23 mmol/L (ref 22–32)
Calcium: 9.9 mg/dL (ref 8.9–10.3)
Chloride: 103 mmol/L (ref 98–111)
Creatinine, Ser: 1.04 mg/dL (ref 0.61–1.24)
GFR, Estimated: >60 mL/min (ref >60)
Glucose, Bld: 207 mg/dL — ABNORMAL HIGH (ref 70–99)
Potassium: 3.8 mmol/L (ref 3.5–5.1)
Sodium: 137 mmol/L (ref 135–145)
Total Bilirubin: 0.5 mg/dL (ref 0.3–1.2)
Total Protein: 7.5 g/dL (ref 6.5–8.1)

## 2021-09-21 LAB — LIPASE, BLOOD: Lipase: 33 U/L (ref 11–51)

## 2021-09-21 LAB — TROPONIN I (HIGH SENSITIVITY): Troponin I (High Sensitivity): 12 ng/L (ref <18)

## 2021-09-21 MED ORDER — IOHEXOL 300 MG/ML  SOLN
100.0000 mL | Freq: Once | INTRAMUSCULAR | Status: AC | PRN
  Administered 2021-09-21: 100 mL via INTRAVENOUS

## 2021-09-21 MED ORDER — LIDOCAINE VISCOUS HCL 2 % MT SOLN
15.0000 mL | Freq: Once | OROMUCOSAL | Status: AC
  Administered 2021-09-21: 15 mL via ORAL
  Filled 2021-09-21: qty 15

## 2021-09-21 MED ORDER — ONDANSETRON HCL 4 MG/2ML IJ SOLN
4.0000 mg | Freq: Once | INTRAMUSCULAR | Status: AC
  Administered 2021-09-21: 4 mg via INTRAVENOUS
  Filled 2021-09-21: qty 2

## 2021-09-21 MED ORDER — SUCRALFATE 1 G PO TABS: 1.0000 g | ORAL_TABLET | Freq: Three times a day (TID) | ORAL | 1 refills | Status: DC

## 2021-09-21 MED ORDER — ALUM & MAG HYDROXIDE-SIMETH 200-200-20 MG/5ML PO SUSP
30.0000 mL | Freq: Once | ORAL | Status: AC
  Administered 2021-09-21: 30 mL via ORAL
  Filled 2021-09-21: qty 30

## 2021-09-21 MED ORDER — MORPHINE SULFATE (PF) 4 MG/ML IV SOLN
4.0000 mg | Freq: Once | INTRAVENOUS | Status: AC
  Administered 2021-09-21: 4 mg via INTRAVENOUS
  Filled 2021-09-21: qty 1

## 2021-09-21 MED ORDER — FAMOTIDINE 20 MG PO TABS: 20.0000 mg | ORAL_TABLET | Freq: Two times a day (BID) | ORAL | 0 refills | Status: DC

## 2021-09-21 MED ORDER — PANTOPRAZOLE SODIUM 20 MG PO TBEC: 20.0000 mg | DELAYED_RELEASE_TABLET | Freq: Every day | ORAL | 1 refills | Status: DC

## 2021-09-21 NOTE — ED Provider Notes (Signed)
Emergency Medicine Provider Triage Evaluation Note  Brian Dominguez , a 75 y.o. male  was evaluated in triage.  Pt complains of abdominal pain.  Located epigastric region.  He feels nauseous, bloated.  He is unsure if he has any chest pain.  Pain x1 week however worse today.  Does not go into his back.  States he feels like he has an ulcer.  He denies any melena or bright blood per rectum.  Last bowel movement this morning.  Review of Systems  Positive: Abdominal pain, abdominal bloating, nausea Negative: Fever, chest pain, shortness of breath, back pain, melena, blood per  Physical Exam  BP (!) 141/83 (BP Location: Right Arm)   Pulse (!) 105   Temp 98.7 F (37.1 C)   Resp 16   SpO2 99%  Gen:   Awake, no distress   Resp:  Normal effort  MSK:   Moves extremities without difficulty  ABD:  Diffuse tenderness to upper abdomen Other:    Medical Decision Making  Medically screening exam initiated at 11:10 AM.  Appropriate orders placed.  [name] was informed that the remainder of the evaluation will be completed by another provider, this initial triage assessment does not replace that evaluation, and the importance of remaining in the ED until their evaluation is complete.  Abd pain, nausea   Abhiram Criado A, PA-C 09/21/21 1111    Tegeler, Canary Brim, MD 09/21/21 1501

## 2021-09-21 NOTE — ED Provider Notes (Signed)
6:22 PM signout from [place] PA-C at shift change.  Patient here with ongoing epigastric pain.  Also diarrhea and nausea without vomiting.  No bleeding reported.  This has been a recurrent problem for the patient.  He states that he has had GI work-ups in the past.  Lab work is reassuring today.  Initially waiting abdominal CT.  CT was reassuring, personally reviewed.  No signs of infection, perforation, obstruction, or other problems.  Patient will be discharged home with Protonix, Pepcid, Carafate.  And it is encouraged that he follow-up with his PCP.  He is anxious to leave.  Prior to discharge he will be given a GI cocktail.  BP (!) 150/82   Pulse 85   Temp 99.2 F (37.3 C) (Oral)   Resp (!) 21   SpO2 99%    On exam, abdomen is soft without rebound or guarding.  He reports some mild epigastric discomfort with palpation in this area.  He looks well, moving normally, in no distress.   Renne Crigler, PA-C 09/21/21 1824    Virgina Norfolk, DO 09/21/21 2252

## 2021-09-21 NOTE — ED Triage Notes (Signed)
Pt with epigastric pain and nausea for 1 week. Denies fevers, emesis or diarrhea.
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
## 2021-09-21 NOTE — Discharge Instructions (Signed)
Please read and follow all provided instructions.  Your diagnoses today include:  1. Epigastric abdominal pain     Tests performed today include: Blood cell counts and platelets Kidney and liver function tests Pancreas function test (called lipase) Urine test to look for infection CT scan - does not show any serious problems Vital signs. See below for your results today.   Medications prescribed:  Pantoprazole (Protonix) - stomach acid reducer  Pepcid (famotidine) - antihistamine  You can find this medication over-the-counter.   DO NOT exceed:  20mg  Pepcid every 12 hours  Carafate - for stomach upset and to protect your stomach  Take any prescribed medications only as directed.  Home care instructions:  Follow any educational materials contained in this packet.  Follow-up instructions: Please follow-up with your primary care provider in the next 2 days for further evaluation of your symptoms.    Return instructions:  SEEK IMMEDIATE MEDICAL ATTENTION IF: The pain does not go away or becomes severe  A temperature above 101F develops  Repeated vomiting occurs (multiple episodes)  The pain becomes localized to portions of the abdomen. The right side could possibly be appendicitis. In an adult, the left lower portion of the abdomen could be colitis or diverticulitis.  Blood is being passed in stools or vomit (bright red or black tarry stools)  You develop chest pain, difficulty breathing, dizziness or fainting, or become confused, poorly responsive, or inconsolable (young children) If you have any other emergent concerns regarding your health  Additional Information: Abdominal (belly) pain can be caused by many things. Your caregiver performed an examination and possibly ordered blood/urine tests and imaging (CT scan, x-rays, ultrasound). Many cases can be observed and treated at home after initial evaluation in the emergency department. Even though you are being discharged  home, abdominal pain can be unpredictable. Therefore, you need a repeated exam if your pain does not resolve, returns, or worsens. Most patients with abdominal pain don't have to be admitted to the hospital or have surgery, but serious problems like appendicitis and gallbladder attacks can start out as nonspecific pain. Many abdominal conditions cannot be diagnosed in one visit, so follow-up evaluations are very important.  Your vital signs today were: BP (!) 150/82   Pulse 85   Temp 99.2 F (37.3 C) (Oral)   Resp (!) 21   SpO2 99%  If your blood pressure (bp) was elevated above 135/85 this visit, please have this repeated by your doctor within one month. --------------

## 2021-10-11 ENCOUNTER — Encounter: Payer: Self-pay | Admitting: Family Medicine

## 2021-10-11 ENCOUNTER — Ambulatory Visit (INDEPENDENT_AMBULATORY_CARE_PROVIDER_SITE_OTHER): Payer: Medicare Other | Admitting: Family Medicine

## 2021-10-11 VITALS — BP 128/70 | Ht 74.0 in | Wt 200.0 lb

## 2021-10-11 DIAGNOSIS — R269 Unspecified abnormalities of gait and mobility: Secondary | ICD-10-CM

## 2021-10-11 DIAGNOSIS — M2170 Unequal limb length (acquired), unspecified site: Secondary | ICD-10-CM | POA: Diagnosis not present

## 2021-10-11 DIAGNOSIS — M545 Low back pain, unspecified: Secondary | ICD-10-CM

## 2021-10-11 DIAGNOSIS — G8929 Other chronic pain: Secondary | ICD-10-CM

## 2021-10-11 NOTE — Patient Instructions (Signed)
Your left leg is shorter than your right. We added a 5/16" heel lift. This may take a little getting used to but should help with your back - the big question is to what degree will it help. You can put the extra lift in a different pair of shoes if there's another you wear a lot. These need to be replaced about every 6 months.
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
## 2021-10-12 ENCOUNTER — Other Ambulatory Visit: Payer: Self-pay | Admitting: Family Medicine

## 2021-11-24 ENCOUNTER — Emergency Department (HOSPITAL_COMMUNITY): Payer: Medicare Other

## 2021-11-24 ENCOUNTER — Other Ambulatory Visit: Payer: Self-pay

## 2021-11-24 ENCOUNTER — Emergency Department (HOSPITAL_COMMUNITY)
Admission: EM | Admit: 2021-11-24 | Discharge: 2021-11-24 | Disposition: A | Discharge status: Home / Self Care | Payer: Medicare Other | Attending: Emergency Medicine | Admitting: Emergency Medicine

## 2021-11-24 ENCOUNTER — Encounter (HOSPITAL_COMMUNITY): Payer: Self-pay | Admitting: Emergency Medicine

## 2021-11-24 DIAGNOSIS — R1084 Generalized abdominal pain: Secondary | ICD-10-CM | POA: Insufficient documentation

## 2021-11-24 DIAGNOSIS — Z7984 Long term (current) use of oral hypoglycemic drugs: Secondary | ICD-10-CM | POA: Insufficient documentation

## 2021-11-24 DIAGNOSIS — E119 Type 2 diabetes mellitus without complications: Secondary | ICD-10-CM | POA: Diagnosis not present

## 2021-11-24 DIAGNOSIS — Z87891 Personal history of nicotine dependence: Secondary | ICD-10-CM | POA: Insufficient documentation

## 2021-11-24 DIAGNOSIS — R1013 Epigastric pain: Secondary | ICD-10-CM | POA: Diagnosis present

## 2021-11-24 DIAGNOSIS — I10 Essential (primary) hypertension: Secondary | ICD-10-CM | POA: Diagnosis not present

## 2021-11-24 LAB — COMPREHENSIVE METABOLIC PANEL
ALT: 12 U/L (ref 0–44)
AST: 14 U/L — ABNORMAL LOW (ref 15–41)
Albumin: 3.9 g/dL (ref 3.5–5.0)
Alkaline Phosphatase: 48 U/L (ref 38–126)
Anion gap: 8 (ref 5–15)
BUN: 7 mg/dL — ABNORMAL LOW (ref 8–23)
CO2: 28 mmol/L (ref 22–32)
Calcium: 9.4 mg/dL (ref 8.9–10.3)
Chloride: 104 mmol/L (ref 98–111)
Creatinine, Ser: 1.03 mg/dL (ref 0.61–1.24)
GFR, Estimated: >60 mL/min (ref >60)
Glucose, Bld: 172 mg/dL — ABNORMAL HIGH (ref 70–99)
Potassium: 3.7 mmol/L (ref 3.5–5.1)
Sodium: 140 mmol/L (ref 135–145)
Total Bilirubin: 0.5 mg/dL (ref 0.3–1.2)
Total Protein: 6.9 g/dL (ref 6.5–8.1)

## 2021-11-24 LAB — CBC WITH DIFFERENTIAL/PLATELET
Abs Immature Granulocytes: 0.01 10*3/uL (ref 0.00–0.07)
Basophils Absolute: 0 10*3/uL (ref 0.0–0.1)
Basophils Relative: 1 %
Eosinophils Absolute: 0.2 10*3/uL (ref 0.0–0.5)
Eosinophils Relative: 4 %
HCT: 42.6 % (ref 39.0–52.0)
Hemoglobin: 14.1 g/dL (ref 13.0–17.0)
Immature Granulocytes: 0 %
Lymphocytes Relative: 39 %
Lymphs Abs: 1.8 10*3/uL (ref 0.7–4.0)
MCH: 28.3 pg (ref 26.0–34.0)
MCHC: 33.1 g/dL (ref 30.0–36.0)
MCV: 85.4 fL (ref 80.0–100.0)
Monocytes Absolute: 0.4 10*3/uL (ref 0.1–1.0)
Monocytes Relative: 9 %
Neutro Abs: 2.3 10*3/uL (ref 1.7–7.7)
Neutrophils Relative %: 47 %
Platelets: 263 10*3/uL (ref 150–400)
RBC: 4.99 MIL/uL (ref 4.22–5.81)
RDW: 13.2 % (ref 11.5–15.5)
WBC: 4.8 10*3/uL (ref 4.0–10.5)
nRBC: 0 % (ref 0.0–0.2)

## 2021-11-24 LAB — TROPONIN I (HIGH SENSITIVITY)
Troponin I (High Sensitivity): 8 ng/L (ref <18)
Troponin I (High Sensitivity): 8 ng/L (ref <18)

## 2021-11-24 LAB — LIPASE, BLOOD: Lipase: 44 U/L (ref 11–51)

## 2021-11-24 MED ORDER — IOHEXOL 300 MG/ML  SOLN
100.0000 mL | Freq: Once | INTRAMUSCULAR | Status: AC | PRN
  Administered 2021-11-24: 100 mL via INTRAVENOUS

## 2021-11-24 MED ORDER — ALUM & MAG HYDROXIDE-SIMETH 200-200-20 MG/5ML PO SUSP
30.0000 mL | Freq: Once | ORAL | Status: AC
  Administered 2021-11-24: 30 mL via ORAL
  Filled 2021-11-24: qty 30

## 2021-11-24 MED ORDER — LIDOCAINE VISCOUS HCL 2 % MT SOLN
15.0000 mL | Freq: Once | OROMUCOSAL | Status: AC
  Administered 2021-11-24: 15 mL via ORAL
  Filled 2021-11-24: qty 15

## 2021-11-24 NOTE — ED Triage Notes (Signed)
Patient here with what he states is acid reflux.  He states that he feels like his abdomen is in his chest.  He denies any chest pain, shortness of breath, nausea or vomiting. No diaphoresis.  Patient states that he has dealt with this for a long time and really could not describe his feeling.

## 2021-11-24 NOTE — ED Notes (Signed)
Patient is in room, in gown. He complains of pain in his chest "feels like my stomach is in my chest".  Patient states he has had these symptoms for years.

## 2021-11-24 NOTE — Discharge Instructions (Signed)
Return for any problem.   Follow up with your GI physician as discussed.   Keep a food diary as discussed - this may help associate specific items in your diet that could be making your symptoms worse.
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
## 2021-11-24 NOTE — ED Provider Notes (Signed)
Patient seen after prior EDP.  Patient CT imaging is without significant acute abnormality.  Patient's labs reviewed and are without significant acute abnormality.  Patient understands results from ED work-up.  He understands need for close follow-up with his already established GI care provider in the outpatient setting.  Patient is advised to attempt to maintain a food diary so that he could try to associate his symptoms with specific items in his diet.  Importance of close follow-up is stressed.  Strict return precautions given and understood.    Wynetta Fines, MD 11/24/21 [phone]

## 2021-11-24 NOTE — ED Notes (Signed)
Patient transported to CT 

## 2021-12-07 ENCOUNTER — Emergency Department (HOSPITAL_COMMUNITY)
Admission: EM | Admit: 2021-12-07 | Discharge: 2021-12-07 | Disposition: A | Discharge status: Home / Self Care | Payer: Medicare Other | Attending: Emergency Medicine | Admitting: Emergency Medicine

## 2021-12-07 ENCOUNTER — Emergency Department (HOSPITAL_COMMUNITY): Payer: Medicare Other

## 2021-12-07 ENCOUNTER — Other Ambulatory Visit: Payer: Self-pay

## 2021-12-07 ENCOUNTER — Encounter (HOSPITAL_COMMUNITY): Payer: Self-pay

## 2021-12-07 DIAGNOSIS — Z87891 Personal history of nicotine dependence: Secondary | ICD-10-CM | POA: Diagnosis not present

## 2021-12-07 DIAGNOSIS — E119 Type 2 diabetes mellitus without complications: Secondary | ICD-10-CM | POA: Diagnosis not present

## 2021-12-07 DIAGNOSIS — R1011 Right upper quadrant pain: Secondary | ICD-10-CM | POA: Diagnosis not present

## 2021-12-07 DIAGNOSIS — Z79899 Other long term (current) drug therapy: Secondary | ICD-10-CM | POA: Insufficient documentation

## 2021-12-07 DIAGNOSIS — I10 Essential (primary) hypertension: Secondary | ICD-10-CM | POA: Diagnosis not present

## 2021-12-07 DIAGNOSIS — Z7984 Long term (current) use of oral hypoglycemic drugs: Secondary | ICD-10-CM | POA: Insufficient documentation

## 2021-12-07 DIAGNOSIS — Z96612 Presence of left artificial shoulder joint: Secondary | ICD-10-CM | POA: Insufficient documentation

## 2021-12-07 DIAGNOSIS — R112 Nausea with vomiting, unspecified: Secondary | ICD-10-CM | POA: Diagnosis present

## 2021-12-07 DIAGNOSIS — Z96651 Presence of right artificial knee joint: Secondary | ICD-10-CM | POA: Diagnosis not present

## 2021-12-07 DIAGNOSIS — U071 COVID-19: Secondary | ICD-10-CM | POA: Diagnosis not present

## 2021-12-07 LAB — COMPREHENSIVE METABOLIC PANEL
ALT: 13 U/L (ref 0–44)
AST: 15 U/L (ref 15–41)
Albumin: 4.3 g/dL (ref 3.5–5.0)
Alkaline Phosphatase: 54 U/L (ref 38–126)
Anion gap: 8 (ref 5–15)
BUN: 12 mg/dL (ref 8–23)
CO2: 29 mmol/L (ref 22–32)
Calcium: 9.5 mg/dL (ref 8.9–10.3)
Chloride: 101 mmol/L (ref 98–111)
Creatinine, Ser: 0.94 mg/dL (ref 0.61–1.24)
GFR, Estimated: >60 mL/min (ref >60)
Glucose, Bld: 172 mg/dL — ABNORMAL HIGH (ref 70–99)
Potassium: 4 mmol/L (ref 3.5–5.1)
Sodium: 138 mmol/L (ref 135–145)
Total Bilirubin: 0.8 mg/dL (ref 0.3–1.2)
Total Protein: 7.8 g/dL (ref 6.5–8.1)

## 2021-12-07 LAB — CBC
HCT: 45.6 % (ref 39.0–52.0)
Hemoglobin: 15 g/dL (ref 13.0–17.0)
MCH: 27.8 pg (ref 26.0–34.0)
MCHC: 32.9 g/dL (ref 30.0–36.0)
MCV: 84.4 fL (ref 80.0–100.0)
Platelets: 299 10*3/uL (ref 150–400)
RBC: 5.4 MIL/uL (ref 4.22–5.81)
RDW: 13 % (ref 11.5–15.5)
WBC: 5.5 10*3/uL (ref 4.0–10.5)
nRBC: 0 % (ref 0.0–0.2)

## 2021-12-07 LAB — LIPASE, BLOOD: Lipase: 41 U/L (ref 11–51)

## 2021-12-07 LAB — RESP PANEL BY RT-PCR (FLU A&B, COVID) ARPGX2
Influenza A by PCR: NEGATIVE
Influenza B by PCR: NEGATIVE
SARS Coronavirus 2 by RT PCR: POSITIVE — AB

## 2021-12-07 MED ORDER — DIPHENHYDRAMINE HCL 50 MG/ML IJ SOLN: 25.0000 mg | Freq: Once | INTRAMUSCULAR | Status: DC

## 2021-12-07 MED ORDER — IOHEXOL 350 MG/ML SOLN
80.0000 mL | Freq: Once | INTRAVENOUS | Status: AC | PRN
  Administered 2021-12-07: 16:00:00 80 mL via INTRAVENOUS

## 2021-12-07 NOTE — ED Triage Notes (Signed)
Patient c/o intermittent RUQ abdominal pain and nausea x 2 weeks. Patient reports a history of Acid reflux. Patient denies CP, vomiting, or SOB.
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
## 2022-02-05 ENCOUNTER — Emergency Department (HOSPITAL_COMMUNITY): Payer: Medicare Other

## 2022-02-05 ENCOUNTER — Encounter (HOSPITAL_COMMUNITY): Payer: Self-pay | Admitting: *Deleted

## 2022-02-05 ENCOUNTER — Emergency Department (HOSPITAL_COMMUNITY)
Admission: EM | Admit: 2022-02-05 | Discharge: 2022-02-05 | Disposition: A | Discharge status: Home / Self Care | Payer: Medicare Other | Attending: Emergency Medicine | Admitting: Emergency Medicine

## 2022-02-05 ENCOUNTER — Other Ambulatory Visit: Payer: Self-pay

## 2022-02-05 DIAGNOSIS — I10 Essential (primary) hypertension: Secondary | ICD-10-CM | POA: Insufficient documentation

## 2022-02-05 DIAGNOSIS — R1011 Right upper quadrant pain: Secondary | ICD-10-CM | POA: Diagnosis present

## 2022-02-05 DIAGNOSIS — R1013 Epigastric pain: Secondary | ICD-10-CM

## 2022-02-05 DIAGNOSIS — E119 Type 2 diabetes mellitus without complications: Secondary | ICD-10-CM | POA: Insufficient documentation

## 2022-02-05 DIAGNOSIS — R110 Nausea: Secondary | ICD-10-CM | POA: Insufficient documentation

## 2022-02-05 DIAGNOSIS — Z7984 Long term (current) use of oral hypoglycemic drugs: Secondary | ICD-10-CM | POA: Diagnosis not present

## 2022-02-05 DIAGNOSIS — Z20822 Contact with and (suspected) exposure to covid-19: Secondary | ICD-10-CM | POA: Diagnosis not present

## 2022-02-05 LAB — CBC WITH DIFFERENTIAL/PLATELET
Abs Immature Granulocytes: 0 10*3/uL (ref 0.00–0.07)
Basophils Absolute: 0 10*3/uL (ref 0.0–0.1)
Basophils Relative: 1 %
Eosinophils Absolute: 0.2 10*3/uL (ref 0.0–0.5)
Eosinophils Relative: 3 %
HCT: 44 % (ref 39.0–52.0)
Hemoglobin: 15.1 g/dL (ref 13.0–17.0)
Immature Granulocytes: 0 %
Lymphocytes Relative: 36 %
Lymphs Abs: 1.7 10*3/uL (ref 0.7–4.0)
MCH: 28.3 pg (ref 26.0–34.0)
MCHC: 34.3 g/dL (ref 30.0–36.0)
MCV: 82.4 fL (ref 80.0–100.0)
Monocytes Absolute: 0.4 10*3/uL (ref 0.1–1.0)
Monocytes Relative: 8 %
Neutro Abs: 2.5 10*3/uL (ref 1.7–7.7)
Neutrophils Relative %: 52 %
Platelets: 248 10*3/uL (ref 150–400)
RBC: 5.34 MIL/uL (ref 4.22–5.81)
RDW: 13.2 % (ref 11.5–15.5)
WBC: 4.7 10*3/uL (ref 4.0–10.5)
nRBC: 0 % (ref 0.0–0.2)

## 2022-02-05 LAB — COMPREHENSIVE METABOLIC PANEL
ALT: 16 U/L (ref 0–44)
AST: 21 U/L (ref 15–41)
Albumin: 4.1 g/dL (ref 3.5–5.0)
Alkaline Phosphatase: 46 U/L (ref 38–126)
Anion gap: 10 (ref 5–15)
BUN: 17 mg/dL (ref 8–23)
CO2: 26 mmol/L (ref 22–32)
Calcium: 9.2 mg/dL (ref 8.9–10.3)
Chloride: 101 mmol/L (ref 98–111)
Creatinine, Ser: 1.12 mg/dL (ref 0.61–1.24)
GFR, Estimated: >60 mL/min (ref >60)
Glucose, Bld: 166 mg/dL — ABNORMAL HIGH (ref 70–99)
Potassium: 4.2 mmol/L (ref 3.5–5.1)
Sodium: 137 mmol/L (ref 135–145)
Total Bilirubin: 0.5 mg/dL (ref 0.3–1.2)
Total Protein: 7 g/dL (ref 6.5–8.1)

## 2022-02-05 LAB — URINALYSIS, ROUTINE W REFLEX MICROSCOPIC
Bilirubin Urine: NEGATIVE
Glucose, UA: NEGATIVE mg/dL
Hgb urine dipstick: NEGATIVE
Ketones, ur: NEGATIVE mg/dL
Leukocytes,Ua: NEGATIVE
Nitrite: NEGATIVE
Protein, ur: NEGATIVE mg/dL
Specific Gravity, Urine: 1.031 — ABNORMAL HIGH (ref 1.005–1.030)
pH: 5 (ref 5.0–8.0)

## 2022-02-05 LAB — RESP PANEL BY RT-PCR (FLU A&B, COVID) ARPGX2
Influenza A by PCR: NEGATIVE
Influenza B by PCR: NEGATIVE
SARS Coronavirus 2 by RT PCR: NEGATIVE

## 2022-02-05 LAB — LIPASE, BLOOD: Lipase: 103 U/L — ABNORMAL HIGH (ref 11–51)

## 2022-02-05 MED ORDER — SODIUM CHLORIDE 0.9 % IV BOLUS
500.0000 mL | Freq: Once | INTRAVENOUS | Status: AC
  Administered 2022-02-05: 500 mL via INTRAVENOUS

## 2022-02-05 MED ORDER — ONDANSETRON HCL 4 MG/2ML IJ SOLN
4.0000 mg | Freq: Once | INTRAMUSCULAR | Status: AC
  Administered 2022-02-05: 4 mg via INTRAVENOUS
  Filled 2022-02-05: qty 2

## 2022-02-05 MED ORDER — SUCRALFATE 1 G PO TABS: 1.0000 g | ORAL_TABLET | Freq: Three times a day (TID) | ORAL | 1 refills | Status: DC

## 2022-02-05 MED ORDER — ONDANSETRON 4 MG PO TBDP: 4.0000 mg | ORAL_TABLET | Freq: Three times a day (TID) | ORAL | 0 refills | Status: DC | PRN

## 2022-02-05 MED ORDER — MELOXICAM 15 MG PO TABS: 15.0000 mg | ORAL_TABLET | Freq: Every day | ORAL | 0 refills | Status: DC

## 2022-02-05 MED ORDER — DEXLANSOPRAZOLE 30 MG PO CPDR: 30.0000 mg | DELAYED_RELEASE_CAPSULE | Freq: Every day | ORAL | 0 refills | Status: DC

## 2022-02-05 MED ORDER — IOHEXOL 300 MG/ML  SOLN
100.0000 mL | Freq: Once | INTRAMUSCULAR | Status: AC | PRN
  Administered 2022-02-05: 100 mL via INTRAVENOUS

## 2022-02-05 MED ORDER — FAMOTIDINE IN NACL 20-0.9 MG/50ML-% IV SOLN
20.0000 mg | Freq: Once | INTRAVENOUS | Status: AC
  Administered 2022-02-05: 20 mg via INTRAVENOUS
  Filled 2022-02-05: qty 50

## 2022-02-05 NOTE — ED Notes (Signed)
Transported to CT 

## 2022-02-05 NOTE — ED Triage Notes (Signed)
Patient presents to ed via POV  c/o abd pain off and on x 1 year. States the pain was worse this am c/o nausea no vomiting today , states he did vomit 1 time last week. Last BM yest normal

## 2022-02-05 NOTE — Discharge Instructions (Addendum)
Stop Protonix and start Dexilant °

## 2022-02-05 NOTE — ED Provider Notes (Signed)
[name] EMERGENCY DEPARTMENT Provider Note   CSN: [id number] Arrival date & time: 02/05/22  7425     History  Chief Complaint  Patient presents with   Abdominal Pain    Brian Dominguez is a 76 y.o. male.  Pt is a 76 yo aa male with a hx of h.pylori, htn, dm, oa, hiatal hernia, gerd, and esophageal strictures.  He is followed by Dr. Elnoria Howard for his GI issues and last saw him on 2/23.  Pt has had intermittent RUQ pain for a year.  He has no gallbladder.  No GI source for pain per Dr. Haywood Pao note.  No f/c.  He has left shoulder pain from his arthritis, but no new pain.  Pt has had some nausea.  No vomiting.  He's been able to eat.      Home Medications Prior to Admission medications   Medication Sig Start Date End Date Taking? Authorizing Provider  Dexlansoprazole (DEXILANT) 30 MG capsule DR Take 1 capsule (30 mg total) by mouth daily. 02/05/22  Yes Jacalyn Lefevre, MD  meloxicam (MOBIC) 15 MG tablet Take 1 tablet (15 mg total) by mouth daily. 02/05/22  Yes Jacalyn Lefevre, MD  ondansetron (ZOFRAN-ODT) 4 MG disintegrating tablet Take 1 tablet (4 mg total) by mouth every 8 (eight) hours as needed for nausea or vomiting. 02/05/22  Yes Jacalyn Lefevre, MD  acetaminophen (TYLENOL) 500 MG tablet Take 500 mg by mouth every 6 (six) hours as needed for mild pain.    [provider]  atorvastatin (LIPITOR) 20 MG tablet Take 1 tablet (20 mg total) by mouth daily. 08/01/20   Mirian Mo, MD  cetirizine (ZYRTEC) 10 MG tablet Take 1 tablet (10 mg total) by mouth daily. 09/10/18   Howard Pouch, MD  diclofenac Sodium (VOLTAREN) 1 % GEL Apply 1 application topically 4 (four) times daily as needed for pain. 09/20/21   [provider]  diltiazem (CARDIZEM CD) 120 MG 24 hr capsule TAKE 1 CAPSULE BY MOUTH EVERY DAY 10/12/21   Derrel Nip, MD  famotidine (PEPCID) 20 MG tablet Take 1 tablet (20 mg total) by mouth 2 (two) times daily. 09/21/21   Renne Crigler, PA-C   HYDROcodone-acetaminophen (NORCO/VICODIN) 5-325 MG tablet Take 1 tablet by mouth every 6 (six) hours as needed for moderate pain.    [provider]  metFORMIN (GLUCOPHAGE) 1000 MG tablet TAKE 1 TABLET (1,000 MG TOTAL) BY MOUTH 2 (TWO) TIMES DAILY WITH A MEAL. 05/29/21   Derrel Nip, MD  sucralfate (CARAFATE) 1 g tablet Take 1 tablet (1 g total) by mouth 4 (four) times daily -  with meals and at bedtime. 02/05/22   Jacalyn Lefevre, MD  traMADol HCl 100 MG TABS Take 100 mg by mouth daily as needed (for pain). 06/29/20   Derrel Nip, MD  gabapentin (NEURONTIN) 100 MG capsule TAKE 1 CAPSULE BY MOUTH THREE TIMES A DAY Patient not taking: No sig reported 01/12/19 02/25/21  Marthenia Rolling, DO      Allergies    Enalapril    Review of Systems   Review of Systems  Gastrointestinal:  Positive for nausea.  All other systems reviewed and are negative.  Physical Exam Updated Vital Signs BP (!) 145/73    Pulse 86    Temp 97.8 F (36.6 C) (Oral)    Resp (!) 31    Ht 6\' 2"  (1.88 m)    Wt 104.3 kg    SpO2 100%    BMI 29.53 kg/m  Physical Exam Vitals and nursing note reviewed.  Constitutional:      Appearance: He is well-developed.  HENT:     Head: Normocephalic and atraumatic.     Mouth/Throat:     Mouth: Mucous membranes are moist.     Pharynx: Oropharynx is clear.  Eyes:     Extraocular Movements: Extraocular movements intact.     Pupils: Pupils are equal, round, and reactive to light.  Cardiovascular:     Rate and Rhythm: Normal rate and regular rhythm.     Heart sounds: Normal heart sounds.  Pulmonary:     Effort: Pulmonary effort is normal.     Breath sounds: Normal breath sounds.  Abdominal:     General: Abdomen is flat. Bowel sounds are normal.     Palpations: Abdomen is soft.     Tenderness: There is abdominal tenderness in the epigastric area.  Skin:    General: Skin is warm.     Capillary Refill: Capillary refill takes less than 2 seconds.  Neurological:      General: No focal deficit present.     Mental Status: He is alert and oriented to person, place, and time.  Psychiatric:        Mood and Affect: Mood normal.        Behavior: Behavior normal.    ED Results / Procedures / Treatments   Labs (all labs ordered are listed, but only abnormal results are displayed) Labs Reviewed  COMPREHENSIVE METABOLIC PANEL - Abnormal; Notable for the following components:      Result Value   Glucose, Bld 166 (*)    All other components within normal limits  LIPASE, BLOOD - Abnormal; Notable for the following components:   Lipase 103 (*)    All other components within normal limits  URINALYSIS, ROUTINE W REFLEX MICROSCOPIC - Abnormal; Notable for the following components:   Specific Gravity, Urine 1.031 (*)    All other components within normal limits  RESP PANEL BY RT-PCR (FLU A&B, COVID) ARPGX2  CBC WITH DIFFERENTIAL/PLATELET    EKG None  Radiology CT ABDOMEN PELVIS W CONTRAST  Result Date: 02/05/2022 CLINICAL DATA:  Epigastric pain EXAM: CT ABDOMEN AND PELVIS WITH CONTRAST TECHNIQUE: Multidetector CT imaging of the abdomen and pelvis was performed using the standard protocol following bolus administration of intravenous contrast. RADIATION DOSE REDUCTION: This exam was performed according to the departmental dose-optimization program which includes automated exposure control, adjustment of the mA and/or kV according to patient size and/or use of iterative reconstruction technique. CONTRAST:  100mL OMNIPAQUE IOHEXOL 300 MG/ML  SOLN COMPARISON:  CT abdomen and pelvis 12/07/2021 FINDINGS: Lower chest: Chronic emphysematous and fibrotic changes in the visualized lower lungs. Compressive atelectatic changes at the left lung base. Elevated left hemidiaphragm. Hepatobiliary: Liver is normal in size and contour with no suspicious mass identified. Stable subcentimeter hypodensity in the left lobe likely represents a cyst or hemangioma. Pancreas: Unremarkable. No  pancreatic ductal dilatation or surrounding inflammatory changes. Spleen: Normal in size without focal abnormality. Adrenals/Urinary Tract: Adrenal glands are unremarkable. Kidneys are normal, without renal calculi, focal lesion, or hydronephrosis. Bladder is unremarkable. Stomach/Bowel: No bowel obstruction, free air or pneumatosis. No bowel wall edema visualized. Colonic diverticulosis. Moderate amount of retained fecal material throughout the colon. Appendix is normal. Vascular/Lymphatic: Moderate atherosclerotic disease. No bulky lymphadenopathy identified. Reproductive: Prostate gland is enlarged. Other: No ascites. Multiple surgical clips in the epigastric region and left upper quadrant. Musculoskeletal: Postsurgical and degenerative changes at L5-S1. No suspicious bony  lesions identified. IMPRESSION: 1. No acute process identified. 2. Colonic diverticulosis. 3. Prostatomegaly. 4. 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 5. Other chronic findings as described. Electronically Signed   By: Jannifer Hick M.D.   On: 02/05/2022 11:12    Procedures Procedures    Medications Ordered in ED Medications  sodium chloride 0.9 % bolus 500 mL (0 mLs Intravenous Stopped 02/05/22 1033)  ondansetron (ZOFRAN) injection 4 mg (4 mg Intravenous Given 02/05/22 0928)  famotidine (PEPCID) IVPB 20 mg premix (0 mg Intravenous Stopped 02/05/22 0956)  iohexol (OMNIPAQUE) 300 MG/ML solution 100 mL (100 mLs Intravenous Contrast Given 02/05/22 1052)    ED Course/ Medical Decision Making/ A&P                           Medical Decision Making Amount and/or Complexity of Data Reviewed Labs: ordered. Radiology: ordered.  Risk Prescription drug management.   This patient presents to the ED for concern of abd pain, this involves an extensive number of treatment options, and is a complaint that carries with it a high risk of complications and morbidity.  The differential diagnosis includes gerd, gastritis,  pancreatitis, uti, infection   Co morbidities that complicate the patient evaluation   h.pylori, htn, dm, oa, hiatal hernia, gerd, and esophageal strictures   Additional history obtained:  Additional history obtained from epic chart review   Lab Tests:  I Ordered, and personally interpreted labs.  The pertinent results include:  cbc is nl, cmp with slight elevation of bs at 166, lipase 103   Imaging Studies ordered:  I ordered imaging studies including ct abd/pelvis  I independently visualized and interpreted imaging which showed 1. No acute process identified. 2. Colonic diverticulosis. 3. Prostatomegaly. 4. 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 I agree with the radiologist interpretation   Cardiac Monitoring:  The patient was maintained on a cardiac monitor.  I personally viewed and interpreted the cardiac monitored which showed an underlying rhythm of: nsr   Medicines ordered and prescription drug management:  I ordered medication including zofran and pepcid  for pain  Reevaluation of the patient after these medicines showed that the patient improved I have reviewed the patients home medicines and have made adjustments as needed   Test Considered:  CT abd/pelvis:  pt has had multiple CT scans in the past.  However,his lipase is slightly elevated today and it has not been in the past.  He is having pain in his epigastrium.   Problem List / ED Course:  Abd pain:  tests today do not show a cause of pain.  Likely, it is his chronic GERD.  Pt has been on protonix for a long time, so I am going to change his ppi (dexilant) and carafate to see if another one is more effective.  Pt thinks his arthritis may be causing his sx, so he is asking for a referral to a rheumatologist.  Pt just saw Dr. Elnoria Howard recently.  He is to f/u with pcp and with gi.  Pt wants something for pain.  I will try him on Mobic as that should not hurt his stomach.  PCP was giving him large  amounts of Lortab, but he has not had this filled in a few months.  Narcotics need to be obtained by PCP.   Reevaluation:  After the interventions noted above, I reevaluated the patient and found that they have :improved    Dispostion:  After consideration of the diagnostic results and the patients response to treatment, I feel that the patent would benefit from discharge with outpatient f/u.          Final Clinical Impression(s) / ED Diagnoses Final diagnoses:  Epigastric pain    Rx / DC Orders ED Discharge Orders          Ordered    Dexlansoprazole (DEXILANT) 30 MG capsule DR  Daily        02/05/22 1141    sucralfate (CARAFATE) 1 g tablet  3 times daily with meals & bedtime        02/05/22 1141    Ambulatory referral to Rheumatology        02/05/22 1141    ondansetron (ZOFRAN-ODT) 4 MG disintegrating tablet  Every 8 hours PRN        02/05/22 1141    meloxicam (MOBIC) 15 MG tablet  Daily        02/05/22 1141              Jacalyn Lefevre, MD 02/05/22 1143

## 2022-03-26 ENCOUNTER — Other Ambulatory Visit: Payer: Self-pay | Admitting: Family Medicine

## 2022-05-29 ENCOUNTER — Other Ambulatory Visit: Payer: Self-pay | Admitting: Gastroenterology

## 2022-05-29 DIAGNOSIS — K449 Diaphragmatic hernia without obstruction or gangrene: Secondary | ICD-10-CM

## 2022-06-06 ENCOUNTER — Ambulatory Visit
Admission: RE | Admit: 2022-06-06 | Discharge: 2022-06-06 | Disposition: A | Discharge status: Home / Self Care | Payer: Medicare Other | Source: Ambulatory Visit | Attending: Gastroenterology | Admitting: Gastroenterology

## 2022-06-06 DIAGNOSIS — K449 Diaphragmatic hernia without obstruction or gangrene: Secondary | ICD-10-CM

## 2022-07-17 ENCOUNTER — Other Ambulatory Visit: Payer: Self-pay | Admitting: Family Medicine

## 2022-09-18 ENCOUNTER — Ambulatory Visit (INDEPENDENT_AMBULATORY_CARE_PROVIDER_SITE_OTHER): Payer: Medicare Other | Admitting: Orthopaedic Surgery

## 2022-09-18 ENCOUNTER — Ambulatory Visit (INDEPENDENT_AMBULATORY_CARE_PROVIDER_SITE_OTHER): Payer: Medicare Other

## 2022-09-18 ENCOUNTER — Encounter: Payer: Self-pay | Admitting: Orthopaedic Surgery

## 2022-09-18 DIAGNOSIS — M5441 Lumbago with sciatica, right side: Secondary | ICD-10-CM

## 2022-09-18 DIAGNOSIS — G8929 Other chronic pain: Secondary | ICD-10-CM

## 2022-09-18 DIAGNOSIS — M5442 Lumbago with sciatica, left side: Secondary | ICD-10-CM | POA: Diagnosis not present

## 2022-09-18 NOTE — Progress Notes (Signed)
The patient is someone that I am seeing for the first time as a new patient but this is for chronic low back pain with radicular symptoms.  He is a 76 year old who actually had lumbar spine surgery by Dr. Lynann Bologna with [facility].  This was done in 2016.  According to the operative note the patient had an L5-S1 anterior lumbar interbody fusion with instrumentation.  He has had a history apparently of an L3-S1 decompression as well.  The patient was referred to [place] from his chiropractor noting pain across his lumbar spine that radiates into both legs.  Has been seeing a chiropractor and a note from the chiropractor said that the patient needs an "orthopedic procedure to clean out the foramen".  I let the patient know that I am not a spine specialist at all and the situation is quite complex and that I am not the right person to see for this.  This did frustrate the patient.  X-rays today of his lumbar spine show an L5-S1 fusion.  There is some lucency around the interbody screws.  From my standpoint the only thing that I can do is send him to my partner Dr. Laurance Flatten for an opinion as it relates to his lumbar spine.  I did recommend that the patient see Dr. Lynann Bologna who is operated on the patient's lumbar spine and cervical spine but he does not want to follow-up with Dr. Lynann Bologna again.  I cannot say that Dr. Laurance Flatten would even operate on his spine at all but the patient still wants to at least see an opinion as it relates to his back.

## 2022-09-26 ENCOUNTER — Ambulatory Visit (INDEPENDENT_AMBULATORY_CARE_PROVIDER_SITE_OTHER): Payer: Medicare Other

## 2022-09-26 ENCOUNTER — Encounter: Payer: Self-pay | Admitting: Orthopedic Surgery

## 2022-09-26 ENCOUNTER — Ambulatory Visit (INDEPENDENT_AMBULATORY_CARE_PROVIDER_SITE_OTHER): Payer: Medicare Other | Admitting: Orthopedic Surgery

## 2022-09-26 VITALS — Ht 72.5 in | Wt 226.8 lb

## 2022-09-26 DIAGNOSIS — M5442 Lumbago with sciatica, left side: Secondary | ICD-10-CM

## 2022-09-26 DIAGNOSIS — M5441 Lumbago with sciatica, right side: Secondary | ICD-10-CM

## 2022-09-26 DIAGNOSIS — M542 Cervicalgia: Secondary | ICD-10-CM | POA: Diagnosis not present

## 2022-09-26 DIAGNOSIS — G8929 Other chronic pain: Secondary | ICD-10-CM

## 2022-09-26 DIAGNOSIS — M5416 Radiculopathy, lumbar region: Secondary | ICD-10-CM | POA: Diagnosis not present

## 2022-09-26 NOTE — Progress Notes (Addendum)
Orthopedic Spine Surgery Office Note  Assessment: Patient is a 76 y.o. male with chronic, progressive low back pain with radiculopathy.  On CT imaging, there is lucency around his L5-S1 interbody and associated screws.  I suspect there is a pseudoarthrosis at this level that may be causing some of his back pain.  He also has radicular type leg pain and a L5 distribution bilaterally (worse on the right).   Plan: -Explained that initially conservative treatment is tried as a significant number of patients may experience relief with these treatment modalities. Discussed that the conservative treatments include:  -activity modification  -physical therapy  -over the counter pain medications  -pain management -Patient has tried gabapentin, Tylenol, NSAIDs, activity modification, physical therapy -I discussed the fact that he is more symptomatic from his lumbar spine and has a radicular type pain, so we would work that up first -Patient has tried multiple conservative treatments and symptoms have been present for a couple of years now, so recommended MRI of the lumbar spine to evaluate for radiculopathy -One of his other complaints is that he gets stiffness in his neck and his back, I explained that I would not be able to correct this with any kind of surgical intervention.  I discussed physical therapy and home stretches to improve but not completely of eliminate his stiffness.  He was not interested in either of these treatments at this time. -Patient should return to office in 4 weeks, repeat x-rays of spine at next visit: none   Patient expressed understanding of the plan and all questions were answered to the patient's satisfaction.   ___________________________________________________________________________   History:  Patient is a 76 y.o. male who presents today for lumbar and cervical spine.  Patient has history of prior decompression lumbar surgeries and a L5-S1 ALIF.  He states that the  surgeries did give him some minor relief.  Over the last couple of years, he has developed low back and bilateral leg pain.  Feels the leg pain go down the thigh of his leg then along the lateral aspect of his leg into the dorsal and lateral aspect of the foot.  He gets paresthesias in the same distribution.  It is worse on the right leg versus the left.  He feels that activity and sitting for an extended period of time worsen the pain.  He gets relief if he lays down.  There was no trauma or injury that brought on this pain.  In regards to his cervical spine, he states that he has had pain here for multiple years as well.  He had a multilevel ACDF that he said was somewhat helpful for his pain that went into his shoulder.  He now has pain in the mid cervical region and the associated paraspinal muscles.  Is worse with activity improves with rest.  There is no trauma or injury that brought on the pain.  He also describes feeling of stiffness in his neck.  He has no pain radiating down his arms.   Weakness: Yes, feels his right leg is weaker at times Difficulty with fine motor skills (e.g., buttoning shirts, handwriting): Denies Symptoms of imbalance: Yes, at times feels off balance because his right leg seems weaker Paresthesias and numbness: Yes, gets paresthesias down the posterior lateral aspect of his bilateral legs Bowel or bladder incontinence: Denies Saddle anesthesia: Denies  Treatments tried: Gabapentin, physical therapy, home exercises, activity modification, Tylenol, NSAIDs  Review of systems: Denies fevers and chills, night sweats, unexplained weight  loss, history of cancer, pain that wakes them at night  Past medical history: Rheumatoid arthritis HTN GERD DM Chronic pain Vertigo Anxiety Depression  Allergies: Enalapril  Past surgical history:  L5/S1 ALIF ACDF Cholecystectomy Hernia repair Right TKA Left shoulder arthroplasty   Social history: Denies use of nicotine  product (smoking, vaping, patches, smokeless) - former Alcohol use: denies Denies recreational drug use   Physical Exam:  General: no acute distress, appears stated age Neurologic: alert, answering questions appropriately, following commands Respiratory: unlabored breathing on room air, symmetric chest rise Psychiatric: appropriate affect, normal cadence to speech   MSK (spine):  -Strength exam      Left  Right Grip strength                5/5  5/5 Interosseus   5/5   5/5 Wrist extension  5/5  5/5 Wrist flexion   5/5  5/5 Elbow flexion   5/5  5/5 Deltoid    5/5  5/5  EHL    4/5  3/5 TA    5/5  5/5 GSC    5/5  5/5 Knee extension  5/5  5/5 Hip flexion   4/5  5/5  -Sensory exam    Sensation intact to light touch in L3-S1 nerve distributions of bilateral lower extremities  Sensation intact to light touch in C5-T1 nerve distributions of bilateral upper extremities  -Brachioradialis DTR: 1/4 on the left, 1/4 on the right -Biceps DTR: 1/4 on the left, 1/4 on the right -Achilles DTR: 1/4 on the left, 1/4 on the right -Patellar tendon DTR: 0/4 on the left, 0/4 on the right  -Spurling: negative bilaterally -Hoffman sign: Negative bilaterally -Clonus: No beats bilaterally -Interosseous wasting: None seen -Grip and release test: Negative -Romberg: Negative -Gait: Slow and short stepped  Tinel's at wrist: Negative bilaterally Phalen's at wrist: Negative bilaterally Durkan's: Negative bilaterally  Tinel's at elbow: Negative bilaterally Phalen's at elbow: Negative bilaterally  Imaging: XR of the cervical spine from 09/26/2022 was independently reviewed and interpreted, showing multilevel anterior cervical discectomy and fusion from C4-7. Facet arthropathy and decreased disc height at C3/4. Lucency at the C7 screws. Appears to be lucency around the C6/7 interbody cage as well. There is lucency around the C5 screw as well.   XR of the lumbar spine from 09/18/2022 and  09/26/2022 was independently reviewed and interpreted, showing L5/S1 interbody device with an anterior plate. There is a lucency through the interbody device. Interbody appears subsided. Less than 50mm change in anterolisthesis between flexion and extension views.   CT of abdomen and pelvis form 02/05/2022 shows a lucency through the interbody device. There is lucency around the anterior plate screws. There are 2 slices on the axial cut where there appears to be autofusion through the right L5/S1 facet joint.   Patient name: Brian Dominguez Patient MRN: [id number] Date of visit: 09/26/22

## 2022-10-02 ENCOUNTER — Telehealth: Payer: Self-pay | Admitting: Orthopedic Surgery

## 2022-10-02 MED ORDER — DIAZEPAM 5 MG PO TABS: 5.0000 mg | ORAL_TABLET | Freq: Once | ORAL | 0 refills | Status: DC | PRN

## 2022-10-02 NOTE — Telephone Encounter (Signed)
Lmom advised that meds were sent in

## 2022-10-02 NOTE — Telephone Encounter (Signed)
Patient came by. Would like some medication called in for him to take before having the MRI. His call back number is [phone]

## 2022-10-10 ENCOUNTER — Ambulatory Visit
Admission: RE | Admit: 2022-10-10 | Discharge: 2022-10-10 | Disposition: A | Discharge status: Home / Self Care | Payer: Medicare Other | Source: Ambulatory Visit | Attending: Orthopedic Surgery | Admitting: Orthopedic Surgery

## 2022-10-10 DIAGNOSIS — M5416 Radiculopathy, lumbar region: Secondary | ICD-10-CM

## 2022-10-19 ENCOUNTER — Ambulatory Visit (INDEPENDENT_AMBULATORY_CARE_PROVIDER_SITE_OTHER): Payer: Medicare Other | Admitting: Orthopedic Surgery

## 2022-10-19 DIAGNOSIS — M5416 Radiculopathy, lumbar region: Secondary | ICD-10-CM

## 2022-10-19 NOTE — Progress Notes (Addendum)
Orthopedic Spine Surgery Office Note  Assessment: Patient is a 76 y.o. male with with chronic low back pain with radiculopathy that is stable from last visit.  He has lucency around his L5-S1 interbody device and associated screws.  I suspect there is a pseudoarthrosis at this level that may be causing some of his back pain.  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    Plan: -Explained that initially conservative treatment is tried as a significant number of patients may experience relief with these treatment modalities. Discussed that the conservative treatments include:  -activity modification  -physical therapy  -over the counter pain medications  -medrol dosepak  -lumbar steroid injections -Patient has tried gabapentin, Tylenol, NSAIDs, activity modification, physical therapy, injections in the past -Recommended a diagnostic and therapeutic injection to the L5/S1 transforaminal space. A referral was provided to Dr. Alvester Morin for this injection. If he gets even temporary relief with this, then he may get benefit from a decompression and revision fusion at that level -Patient should return to office in 4 weeks, repeat x-rays of lumbar spine at next visit: none   Patient expressed understanding of the plan and all questions were answered to the patient's satisfaction.   ___________________________________________________________________________  History: Patient is a 76 y.o. male who has been previously seen in the office for symptoms consistent with lumbar radiculopathy. Since the last visit, symptom intensity has remained the same. He is still having pain in his low back that radiates down the lateral aspect of his bilateral legs.  Feels it is worse with sitting and improves if he lays down.  He has tried multiple treatments over the years. The distribution of the pain has not changed since the last visit.   Previous treatments: gabapentin, Tylenol, NSAIDs, activity  modification, physical therapy, injections in the past  COPY PRIOR NOTE HERE Patient is a 76 y.o. male who presents today for lumbar and cervical spine.  Patient has history of prior decompression lumbar surgeries and a L5-S1 ALIF.  He states that the surgeries did give him some minor relief.  Over the last couple of years, he has developed low back and bilateral leg pain.  Feels the leg pain go down the thigh of his leg then along the lateral aspect of his leg into the dorsal and lateral aspect of the foot.  He gets paresthesias in the same distribution.  It is worse on the right leg versus the left.  He feels that activity and sitting for an extended period of time worsen the pain.  He gets relief if he lays down.  There was no trauma or injury that brought on this pain.   In regards to his cervical spine, he states that he has had pain here for multiple years as well.  He had a multilevel ACDF that he said was somewhat helpful for his pain that went into his shoulder.  He now has pain in the mid cervical region and the associated paraspinal muscles.  Is worse with activity improves with rest.  There is no trauma or injury that brought on the pain.  He also describes feeling of stiffness in his neck.  He has no pain radiating down his arms.     Weakness: Yes, feels his right leg is weaker at times Difficulty with fine motor skills (e.g., buttoning shirts, handwriting): Denies Symptoms of imbalance: Yes, at times feels off balance because his right leg seems weaker Paresthesias and numbness: Yes, gets paresthesias down  the posterior lateral aspect of his bilateral legs Bowel or bladder incontinence: Denies Saddle anesthesia: Denies END OF COPY  Physical Exam:  General: no acute distress, appears stated age Neurologic: alert, answering questions appropriately, following commands Respiratory: unlabored breathing on room air, symmetric chest rise Psychiatric: appropriate affect, normal cadence to  speech   MSK (spine):  -Strength exam      Left  Right EHL    4/5  3/5 TA    5/5  5/5 GSC    5/5  5/5 Knee extension  5/5  5/5 Hip flexion   4/5  5/5  -Sensory exam    Sensation intact to light touch in L3-S1 nerve distributions of bilateral lower extremities   Imaging: XR of the lumbar spine from 09/18/2022 and 09/26/2022 was previously independently reviewed and interpreted, showing L5/S1 interbody device with an anterior plate. There is a lucency through the interbody device. Interbody appears subsided. Less than 6mm change in anterolisthesis between flexion and extension views.    CT of abdomen and pelvis from 02/05/2022 was previously reviewed and showed a lucency through the interbody device. There is lucency around the anterior plate screws. There are 2 slices on the axial cut where there appears to be autofusion through the right L5/S1 facet joint.  MRI of the lumbar spine from 10/10/2022 was independently reviewed and interpreted, showing foraminal stenosis at L5/S1 particularly on the left side. There is also foraminal stenosis at L4/5. Laminectomy defects from L3-S1.    Patient name: Brian Dominguez Patient MRN: [id number] Date of visit: 10/19/22

## 2022-10-23 ENCOUNTER — Other Ambulatory Visit: Payer: Self-pay

## 2022-10-23 DIAGNOSIS — M5416 Radiculopathy, lumbar region: Secondary | ICD-10-CM

## 2022-11-05 ENCOUNTER — Ambulatory Visit: Payer: Self-pay

## 2022-11-05 ENCOUNTER — Ambulatory Visit (INDEPENDENT_AMBULATORY_CARE_PROVIDER_SITE_OTHER): Payer: Medicare Other | Admitting: Physical Medicine and Rehabilitation

## 2022-11-05 DIAGNOSIS — M961 Postlaminectomy syndrome, not elsewhere classified: Secondary | ICD-10-CM

## 2022-11-05 DIAGNOSIS — M5416 Radiculopathy, lumbar region: Secondary | ICD-10-CM | POA: Diagnosis not present

## 2022-11-05 MED ORDER — METHYLPREDNISOLONE ACETATE 80 MG/ML IJ SUSP
80.0000 mg | Freq: Once | INTRAMUSCULAR | Status: AC
  Administered 2022-11-05: 80 mg

## 2022-11-05 NOTE — Procedures (Signed)
Lumbosacral Transforaminal Epidural Steroid Injection - Sub-Pedicular Approach with Fluoroscopic Guidance  Patient: Brian Dominguez      Date of Birth: 1946-02-22 MRN: [id number] PCP: No primary care provider on file.      Visit Date: 11/05/2022   Universal Protocol:    Date/Time: 11/05/2022  Consent Given By: the patient  Position: PRONE  Additional Comments: Vital signs were monitored before and after the procedure. Patient was prepped and draped in the usual sterile fashion. The correct patient, procedure, and site was verified.   Injection Procedure Details:   Procedure diagnoses: Lumbar radiculopathy [M54.16]    Meds Administered:  Meds ordered this encounter  Medications   methylPREDNISolone acetate (DEPO-MEDROL) injection 80 mg    Laterality: Bilateral  Location/Site: L5 **interestingly the radiologist reading the latest MRI of the lumbar spine indicates surgical level as the L4-5 surgical level with transitional segment.  Dr. Christell Constant who requested the procedure determine the surgical level as L5-S1.  Looking at the x-ray this seemingly should be called an L5-S1 anterior fusion.  Based on Dr. Kathi Der request this is an L5 injection at the level of the fusion.  Needle:5.0 in., 22 ga.  Short bevel or Quincke spinal needle  Needle Placement: Transforaminal  Findings:    -Comments: Excellent flow of contrast along the nerve, nerve root and into the epidural space.  Procedure Details: After squaring off the end-plates to get a true AP view, the C-arm was positioned so that an oblique view of the foramen as noted above was visualized. The target area is just inferior to the "nose of the scotty dog" or sub pedicular. The soft tissues overlying this structure were infiltrated with 2-3 ml. of 1% Lidocaine without Epinephrine.  The spinal needle was inserted toward the target using a "trajectory" view along the fluoroscope beam.  Under AP and lateral visualization, the needle was  advanced so it did not puncture dura and was located close the 6 O'Clock position of the pedical in AP tracterory. Biplanar projections were used to confirm position. Aspiration was confirmed to be negative for CSF and/or blood. A 1-2 ml. volume of Isovue-250 was injected and flow of contrast was noted at each level. Radiographs were obtained for documentation purposes.   After attaining the desired flow of contrast documented above, a 0.5 to 1.0 ml test dose of 0.25% Marcaine was injected into each respective transforaminal space.  The patient was observed for 90 seconds post injection.  After no sensory deficits were reported, and normal lower extremity motor function was noted,   the above injectate was administered so that equal amounts of the injectate were placed at each foramen (level) into the transforaminal epidural space.   Additional Comments:  The patient tolerated the procedure well Dressing: 2 x 2 sterile gauze and Band-Aid    Post-procedure details: Patient was observed during the procedure. Post-procedure instructions were reviewed.  Patient left the clinic in stable condition.

## 2022-11-05 NOTE — Progress Notes (Signed)
Brian Dominguez - 76 y.o. male MRN [id number]  Date of birth: [birth year]  Office Visit Note: Visit Date: 11/05/2022 PCP: No primary care provider on file. Referred by: London Sheer, MD  Subjective: Chief Complaint  Patient presents with   Lower Back - Pain   HPI:  Brian Dominguez is a 76 y.o. male who comes in today at the request of Dr. Willia Craze for planned Bilateral L5-S1 Lumbar Transforaminal epidural steroid injection with fluoroscopic guidance.  The patient has failed conservative care including home exercise, medications, time and activity modification.  This injection will be diagnostic and hopefully therapeutic.  Please see requesting physician notes for further details and justification.  **Interestingly the radiologist reading the latest MRI of the lumbar spine indicates surgical level as the L4-5 surgical level with transitional segment.  Dr. Christell Constant who requested the procedure determine the surgical level as L5-S1.  Looking at the x-ray this seemingly should be called an L5-S1 anterior fusion.  Based on Dr. Kathi Der request this is an L5 injection at the level of the fusion.   ROS Otherwise per HPI.  Assessment & Plan: Visit Diagnoses:    ICD-10-CM   1. 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   Meds & Orders:  Meds ordered this encounter  Medications   methylPREDNISolone acetate (DEPO-MEDROL) injection 80 mg    Orders Placed This Encounter  Procedures   XR C-ARM NO REPORT   Epidural Steroid injection    Follow-up: Return for visit to requesting provider as needed.   Procedures: No procedures performed  Lumbosacral Transforaminal Epidural Steroid Injection - Sub-Pedicular Approach with Fluoroscopic Guidance  Patient: Brian Dominguez      Date of Birth: May 24, 1946 MRN: [id number] PCP:  No primary care provider on file.      Visit Date: 11/05/2022   Universal Protocol:    Date/Time: 11/05/2022  Consent Given By: the patient  Position: PRONE  Additional Comments: Vital signs were monitored before and after the procedure. Patient was prepped and draped in the usual sterile fashion. The correct patient, procedure, and site was verified.   Injection Procedure Details:   Procedure diagnoses: Lumbar radiculopathy [M54.16]    Meds Administered:  Meds ordered this encounter  Medications   methylPREDNISolone acetate (DEPO-MEDROL) injection 80 mg    Laterality: Bilateral  Location/Site: L5 **interestingly the radiologist reading the latest MRI of the lumbar spine indicates surgical level as the L4-5 surgical level with transitional segment.  Dr. Christell Constant who requested the procedure determine the surgical level as L5-S1.  Looking at the x-ray this seemingly should be called an L5-S1 anterior fusion.  Based on Dr. Kathi Der request this is an L5 injection at the level of the fusion.  Needle:5.0 in., 22 ga.  Short bevel or Quincke spinal needle  Needle Placement: Transforaminal  Findings:    -Comments: Excellent flow of contrast along the nerve, nerve root and into the epidural space.  Procedure Details: After squaring off the end-plates to get a true AP view, the C-arm was positioned so that an oblique view of the foramen as noted above was visualized. The target area is just inferior to the "nose of the scotty dog" or sub pedicular. The soft tissues overlying this structure were infiltrated with 2-3 ml. of 1% Lidocaine  without Epinephrine.  The spinal needle was inserted toward the target using a "trajectory" view along the fluoroscope beam.  Under AP and lateral visualization, the needle was advanced so it did not puncture dura and was located close the 6 O'Clock position of the pedical in AP tracterory. Biplanar projections were used to confirm position. Aspiration was  confirmed to be negative for CSF and/or blood. A 1-2 ml. volume of Isovue-250 was injected and flow of contrast was noted at each level. Radiographs were obtained for documentation purposes.   After attaining the desired flow of contrast documented above, a 0.5 to 1.0 ml test dose of 0.25% Marcaine was injected into each respective transforaminal space.  The patient was observed for 90 seconds post injection.  After no sensory deficits were reported, and normal lower extremity motor function was noted,   the above injectate was administered so that equal amounts of the injectate were placed at each foramen (level) into the transforaminal epidural space.   Additional Comments:  The patient tolerated the procedure well Dressing: 2 x 2 sterile gauze and Band-Aid    Post-procedure details: Patient was observed during the procedure. Post-procedure instructions were reviewed.  Patient left the clinic in stable condition.    Clinical History: MRI LUMBAR SPINE WITHOUT CONTRAST   TECHNIQUE: Multiplanar, multisequence MR imaging of the lumbar spine was performed. No intravenous contrast was administered.   COMPARISON:  Abdominal CT 02/05/2022.  Lumbar MRI 05/23/2016   FINDINGS: Segmentation:  Rudimentary disc space at L5-S1.   Alignment:  Anterolisthesis at L4-5.   Vertebrae: Laminectomy from L2-L5. Anterior lumbar interbody fusion at L4-5. Extensive spurring at the L4-5 level, pseudoarthrosis findings seen on 02/05/2022 CT. No evidence of fracture or aggressive bone lesion.   Conus medullaris and cauda equina: Conus extends to the L1-2 level. Conus appears normal. There is an empty sac appearance at the level of L5-S1   Paraspinal and other soft tissues: Postoperative scarring and fatty muscular atrophy at lower lumbar levels.   Disc levels:   T10-11: Only seen on sagittal imaging there is a right paracentral protrusion.   T12- L1: Unremarkable.   L1-L2: Mild disc bulging and  facet spurring   L2-L3: Foraminal predominant disc bulging and endplate spurring. Mild facet spurring.   L3-L4: Disc narrowing and circumferential bulging. Bilateral facet spurring. Moderate bilateral foraminal stenosis. Patent spinal canal   L4-L5: Postoperative level as described above. Prominent facet spurring, disc narrowing, and residual disc bulge causes left more than right foraminal stenosis. Pronounced left foraminal impingement   L5-S1:Incomplete segmentation.  No degeneration or impingement   IMPRESSION: 1. Cage subsidence and bulky ventral spurring at the L4-5 ALIF, pseudoarthrosis findings by CT earlier this year. 2. Extensive laminectomy with patent spinal canal. 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 3. 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     Electronically Signed   By: Tiburcio Pea M.D.   On: 10/12/2022 07:35     Objective:  VS:  HT:    WT:   BMI:     BP:   HR: bpm  TEMP: ( )  RESP:  Physical Exam Vitals and nursing note reviewed.  Constitutional:      General: He is not in acute distress.    Appearance: Normal appearance. He is not ill-appearing.  HENT:     Head: Normocephalic and atraumatic.     Right Ear: External ear normal.     Left Ear: External ear normal.  Nose: No congestion.  Eyes:     Extraocular Movements: Extraocular movements intact.  Cardiovascular:     Rate and Rhythm: Normal rate.     Pulses: Normal pulses.  Pulmonary:     Effort: Pulmonary effort is normal. No respiratory distress.  Abdominal:     General: There is no distension.     Palpations: Abdomen is soft.  Musculoskeletal:        General: No tenderness or signs of injury.     Cervical back: Neck supple.     Right lower leg: No edema.     Left lower leg: No edema.     Comments: Patient has good distal strength without clonus.  Skin:    Findings: No erythema or rash.  Neurological:     General: No focal deficit present.      Mental Status: He is alert and oriented to person, place, and time.     Sensory: No sensory deficit.     Motor: No weakness or abnormal muscle tone.     Coordination: Coordination normal.  Psychiatric:        Mood and Affect: Mood normal.        Behavior: Behavior normal.      Imaging: XR C-ARM NO REPORT  Result Date: 11/05/2022 Please see Notes tab for imaging impression.

## 2022-11-05 NOTE — Progress Notes (Signed)
Numeric Pain Rating Scale and Functional Assessment Average Pain 8   In the last MONTH (on 0-10 scale) has pain interfered with the following?  1. General activity like being  able to carry out your everyday physical activities such as walking, climbing stairs, carrying groceries, or moving a chair?  Rating(8)  Lower back pain radiating down both legs. Hurts all day , every day. +Driver, -BT, -Dye Allergies.

## 2022-11-05 NOTE — Patient Instructions (Signed)

## 2022-11-16 ENCOUNTER — Ambulatory Visit (INDEPENDENT_AMBULATORY_CARE_PROVIDER_SITE_OTHER): Payer: Medicare Other | Admitting: Orthopedic Surgery

## 2022-11-16 DIAGNOSIS — M5416 Radiculopathy, lumbar region: Secondary | ICD-10-CM

## 2022-11-16 NOTE — Progress Notes (Signed)
Orthopedic Spine Surgery Office Note  Assessment: Patient is a 76 y.o. male with chronic low back pain with radiculopathy.  He has lucency around his L5-S1 interbody device and associated screws.  I suspect there is a pseudoarthrosis at this level that may be causing some of his back pain.  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   Plan: -Explained that initially conservative treatment is tried as a significant number of patients may experience relief with these treatment modalities. Discussed that the conservative treatments include:  -activity modification  -physical therapy  -over the counter pain medications  -medrol dosepak  -lumbar steroid injections -Patient has tried gabapentin, Tylenol, NSAIDs, activity modification, physical therapy, injections -Has not gotten any lasting relief with the above treatments. He did get good temporary relief with an L5/S1 transforaminal injection. His leg pain was helped significantly, but it did not last. His pain is affecting him on a daily basis and interfering with his ability to do daily activities. Accordingly, recommended operative management in the form of L5/S1 bilateral foraminotomies, partial facetectomies, and PSIF -Patient will next be seen at the date of surgery   The patient has symptoms consistent with lumbar stenosis. The patient's symptoms were not getting improvement with conservative treatment so operative management was discussed in the form of L5/S1 bilateral foraminotomies, partial facetectomies, and PSIF. The risks including but not limited to dural tear, nerve root injury, paralysis, persistent pain, pseudarthrosis, infection, bleeding, hardware failure, adjacent segment disease, heart attack, death, stroke, fracture, blindness, and need for additional procedures were discussed with the patient. I explained that his risk of durotomy would be higher since this would be a revision surgery. The benefit of the  surgery would be improvement in his leg pain. I explained that back pain relief is not the goal of the surgery and it is not reliably alleviated with this surgery. The alternatives to surgical management were covered with the patient and included continued monitoring, physical therapy, over-the-counter pain medications, repeat injections, and activity modification. All the patient's questions were answered to his satisfaction. After this discussion, the patient expressed understanding and elected to proceed with surgical intervention.     ___________________________________________________________________________  History: Patient is a 76 y.o. male who has been previously seen in the office for symptoms consistent with lumbar radiculopathy. Since the last visit, symptom intensity has remained the same. He did get an injection after our last visit and got great relief of his leg pain for about a week but then the injection stopped working. His pain in his low back that radiates down the lateral aspect of his bilateral legs into the dorsal lateral aspect of ht foot. Feels it on a daily basis. It worse with activity and does get a little better if he lays down. He does get tingling in the same distribution as the pain. No numbness.   Previous treatments:  gabapentin, Tylenol, NSAIDs, activity modification, physical therapy, injections  Physical Exam:  General: no acute distress, appears stated age Neurologic: alert, answering questions appropriately, following commands Respiratory: unlabored breathing on room air, symmetric chest rise Psychiatric: appropriate affect, normal cadence to speech   MSK (spine):  -Strength exam      Left  Right EHL    4/5  3/5 TA    5/5  5/5 GSC    5/5  5/5 Knee extension  5/5  5/5 Hip flexion   5/5  5/5  -Sensory exam    Sensation intact to light touch  in L3-S1 nerve distributions of bilateral lower extremities  -Achilles DTR: 1/4 on the left, 1/4 on the  right -Patellar tendon DTR: 1/4 on the left, 1/4 on the right  -Straight leg raise: negative bilaterally -Clonus: no beats bilaterally  Imaging: XR of the lumbar spine from 09/18/2022 and 09/26/2022 was previously independently reviewed and interpreted, showing L5/S1 interbody device with an anterior plate. There is a lucency through the interbody device. Interbody appears subsided. Less than 74mm change in anterolisthesis between flexion and extension views.   CT of abdomen and pelvis form 02/05/2022 was previously interpreted and showed a lucency through the interbody device. There is lucency around the anterior plate screws. There are 2 slices on the axial cut where there appears to be autofusion through the right L5/S1 facet joint. Laminectomy defects from L3-S1.   MRI of the lumbar spine from 10/10/2022 was independently reviewed and interpreted, showing transitional anatomy. 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 There is also foraminal stenosis at L4/5. Laminectomy defect from L3-S1.   Patient name: Brian Dominguez Patient MRN: [id number] Date of visit: 11/16/22

## 2022-12-11 ENCOUNTER — Telehealth: Payer: Self-pay | Admitting: Orthopedic Surgery

## 2022-12-11 NOTE — Telephone Encounter (Signed)
-----   Message from Javier Glazier sent at 12/07/2022 11:06 AM EST ----- Regarding: phone message Patient left message 12/06/22 at 9:14am on your voicemail stating he wanted to schedule his surgery.  I did return the call and left a voicemail message letting patient know Dr. Tawanna Sat surgery scheduler is out of the office today, however his message was received and we will contact him after the first of the year.  [phone]

## 2022-12-11 NOTE — Telephone Encounter (Signed)
I left a message for the patient letting him know we are still waiting on clearance from Dr. Nancy Fetter to be able to schedule surgery. I called [facility] to find out status of clearance request. Patient had an appointment with Dr. Nancy Fetter 11/26/22. [place] Ar could not reach Dr. Lynnda Child nurse, and requested the clearance form to be refaxed to her to give to the nurse. Form was refaxed, and confirmed. Waiting on response from Dr. Nancy Fetter.

## 2022-12-27 NOTE — Pre-Procedure Instructions (Signed)
Surgical Instructions    Your procedure is scheduled on Tuesday, January 01, 2023 at 7:30 AM.  Report to [facility] Main Entrance "A" at 5:30 A.M., then check in with the Admitting office.  Call this number if you have problems the morning of surgery:  [phone]   If you have any questions prior to your surgery date call [phone]: Open Monday-Friday 8am-4pm  *If you experience any cold or flu symptoms such as cough, fever, chills, shortness of breath, etc. between now and your scheduled surgery, please notify us.*    Remember:  Do not eat after midnight the night before your surgery  You may drink clear liquids until 4:30 AM the morning of your surgery.   Clear liquids allowed are: Water, Non-Citrus Juices (without pulp), Carbonated Beverages, Clear Tea, Black Coffee Only (NO MILK, CREAM OR POWDERED CREAMER of any kind), and Gatorade.  Patient Instructions  The night before surgery:  No food after midnight. ONLY clear liquids after midnight  The day of surgery (if you have diabetes): Drink ONE (1) 12 oz G2 given to you in your pre admission testing appointment by 4:30 AM the morning of surgery. Drink in one sitting. Do not sip.  This drink was given to you during your hospital  pre-op appointment visit.  Nothing else to drink after completing the  12 oz bottle of G2.         If you have questions, please contact your surgeon's office.     Take these medicines the morning of surgery with A SIP OF WATER:  cetirizine (ZYRTEC)  fluticasone (FLONASE)  omeprazole (PRILOSEC)   IF NEEDED: gabapentin (NEURONTIN)  tiZANidine (ZANAFLEX)   As of today, STOP taking any Aspirin (unless otherwise instructed by your surgeon) Aleve, Naproxen, Ibuprofen, Motrin, Advil, Goody's, BC's, all herbal medications, fish oil, and all vitamins. This includes your diclofenac Sodium (VOLTAREN) 1 % GEL.  WHAT DO I DO ABOUT MY DIABETES MEDICATION?   Do not take metFORMIN (GLUCOPHAGE)  the  morning of surgery.   HOW TO MANAGE YOUR DIABETES BEFORE AND AFTER SURGERY  Why is it important to control my blood sugar before and after surgery? Improving blood sugar levels before and after surgery helps healing and can limit problems. A way of improving blood sugar control is eating a healthy diet by:  Eating less sugar and carbohydrates  Increasing activity/exercise  Talking with your doctor about reaching your blood sugar goals High blood sugars (greater than 180 mg/dL) can raise your risk of infections and slow your recovery, so you will need to focus on controlling your diabetes during the weeks before surgery. Make sure that the doctor who takes care of your diabetes knows about your planned surgery including the date and location.  How do I manage my blood sugar before surgery? Check your blood sugar at least 4 times a day, starting 2 days before surgery, to make sure that the level is not too high or low.  Check your blood sugar the morning of your surgery when you wake up and every 2 hours until you get to the Short Stay unit.  If your blood sugar is less than 70 mg/dL, you will need to treat for low blood sugar: Do not take insulin. Treat a low blood sugar (less than 70 mg/dL) with  cup of clear juice (cranberry or apple), 4 glucose tablets, OR glucose gel. Recheck blood sugar in 15 minutes after treatment (to make sure it is greater than 70 mg/dL).  If your blood sugar is not greater than 70 mg/dL on recheck, call [phone] for further instructions. Report your blood sugar to the short stay nurse when you get to Short Stay.  If you are admitted to the hospital after surgery: Your blood sugar will be checked by the staff and you will probably be given insulin after surgery (instead of oral diabetes medicines) to make sure you have good blood sugar levels. The goal for blood sugar control after surgery is 80-180 mg/dL.                      Do NOT Smoke (Tobacco/Vaping)  for 24 hours prior to your procedure.  If you use a CPAP at night, you may bring your mask/headgear for your overnight stay.   Contacts, glasses, piercing's, hearing aid's, dentures or partials may not be worn into surgery, please bring cases for these belongings.    For patients admitted to the hospital, discharge time will be determined by your treatment team.   Patients discharged the day of surgery will not be allowed to drive home, and someone needs to stay with them for 24 hours.  SURGICAL WAITING ROOM VISITATION Patients having surgery or a procedure may have two support people in the waiting area. Visitors may stay in the waiting area during the procedure and switch out with other visitors if needed. Only 1 support person is allowed in the pre-op area with the patient AFTER the patient is prepped. This person cannot be switched out. Children under the age of 58 must have an adult accompany them who is not the patient. If the patient needs to stay at the hospital during part of their recovery, the visitor guidelines for inpatient rooms apply.  Please refer to the [facility] website for the visitor guidelines for Inpatients (after your surgery is over and you are in a regular room).    Special instructions:   [facility]- Preparing For Surgery  Before surgery, you can play an important role. Because skin is not sterile, your skin needs to be as free of germs as possible. You can reduce the number of germs on your skin by washing with CHG (chlorahexidine gluconate) Soap before surgery.  CHG is an antiseptic cleaner which kills germs and bonds with the skin to continue killing germs even after washing.    Oral Hygiene is also important to reduce your risk of infection.  Remember - BRUSH YOUR TEETH THE MORNING OF SURGERY WITH YOUR REGULAR TOOTHPASTE  Please do not use if you have an allergy to CHG or antibacterial soaps. If your skin becomes reddened/irritated stop using the CHG.  Do  not shave (including legs and underarms) for at least 48 hours prior to first CHG shower. It is OK to shave your face.  Please follow these instructions carefully.   Shower the NIGHT BEFORE SURGERY and the MORNING OF SURGERY  If you chose to wash your hair, wash your hair first as usual with your normal shampoo.  After you shampoo, rinse your hair and body thoroughly to remove the shampoo.  Use CHG Soap as you would any other liquid soap. You can apply CHG directly to the skin and wash gently with a scrungie or a clean washcloth.   Apply the CHG Soap to your body ONLY FROM THE NECK DOWN.  Do not use on open wounds or open sores. Avoid contact with your eyes, ears, mouth and genitals (private parts). Wash Face and genitals (private parts)  with your normal soap.   Wash thoroughly, paying special attention to the area where your surgery will be performed.  Thoroughly rinse your body with warm water from the neck down.  DO NOT shower/wash with your normal soap after using and rinsing off the CHG Soap.  Pat yourself dry with a CLEAN TOWEL.  Wear CLEAN PAJAMAS to bed the night before surgery  Place CLEAN SHEETS on your bed the night before your surgery  DO NOT SLEEP WITH PETS.   Day of Surgery: Take a shower with CHG soap. Do not wear jewelry or makeup. Do not wear lotions, powders, perfumes/colognes, or deodorant. Do not shave 48 hours prior to surgery.  Men may shave face and neck. Do not wear nail polish, gel polish, artificial nails, or any other type of covering on natural nails (fingers and toes) If you have artificial nails or gel coating that need to be removed by a nail salon, please have this removed prior to surgery. Artificial nails or gel coating may interfere with anesthesia's ability to adequately monitor your vital signs. Wear Clean/Comfortable clothing the morning of surgery Do not bring valuables to the hospital.  [facility] is not responsible for any belongings or  valuables. Remember to brush your teeth WITH YOUR REGULAR TOOTHPASTE.   Please read over the following fact sheets that you were given.  If you received a COVID test during your pre-op visit  it is requested that you wear a mask when out in public, stay away from anyone that may not be feeling well and notify your surgeon if you develop symptoms. If you have been in contact with anyone that has tested positive in the last 10 days please notify you surgeon.

## 2022-12-28 ENCOUNTER — Encounter (HOSPITAL_COMMUNITY)
Admission: RE | Admit: 2022-12-28 | Discharge: 2022-12-28 | Disposition: A | Discharge status: Home / Self Care | Payer: 59 | Source: Ambulatory Visit | Attending: Orthopedic Surgery | Admitting: Orthopedic Surgery

## 2022-12-28 ENCOUNTER — Encounter (HOSPITAL_COMMUNITY): Payer: Self-pay

## 2022-12-28 ENCOUNTER — Other Ambulatory Visit: Payer: Self-pay

## 2022-12-28 VITALS — BP 174/89 | HR 107 | Temp 98.5°F | Resp 17 | Ht 74.0 in | Wt 222.1 lb

## 2022-12-28 DIAGNOSIS — E119 Type 2 diabetes mellitus without complications: Secondary | ICD-10-CM | POA: Diagnosis not present

## 2022-12-28 DIAGNOSIS — M5416 Radiculopathy, lumbar region: Secondary | ICD-10-CM | POA: Diagnosis not present

## 2022-12-28 DIAGNOSIS — Z01818 Encounter for other preprocedural examination: Secondary | ICD-10-CM | POA: Insufficient documentation

## 2022-12-28 LAB — CBC
HCT: 48.6 % (ref 39.0–52.0)
Hemoglobin: 16 g/dL (ref 13.0–17.0)
MCH: 27.2 pg (ref 26.0–34.0)
MCHC: 32.9 g/dL (ref 30.0–36.0)
MCV: 82.5 fL (ref 80.0–100.0)
Platelets: 397 10*3/uL (ref 150–400)
RBC: 5.89 MIL/uL — ABNORMAL HIGH (ref 4.22–5.81)
RDW: 13.2 % (ref 11.5–15.5)
WBC: 5.5 10*3/uL (ref 4.0–10.5)
nRBC: 0 % (ref 0.0–0.2)

## 2022-12-28 LAB — BASIC METABOLIC PANEL
Anion gap: 11 (ref 5–15)
BUN: 13 mg/dL (ref 8–23)
CO2: 30 mmol/L (ref 22–32)
Calcium: 10.2 mg/dL (ref 8.9–10.3)
Chloride: 93 mmol/L — ABNORMAL LOW (ref 98–111)
Creatinine, Ser: 1.29 mg/dL — ABNORMAL HIGH (ref 0.61–1.24)
GFR, Estimated: 57 mL/min — ABNORMAL LOW (ref >60)
Glucose, Bld: 226 mg/dL — ABNORMAL HIGH (ref 70–99)
Potassium: 3.7 mmol/L (ref 3.5–5.1)
Sodium: 134 mmol/L — ABNORMAL LOW (ref 135–145)

## 2022-12-28 LAB — GLUCOSE, CAPILLARY: Glucose-Capillary: 242 mg/dL — ABNORMAL HIGH (ref 70–99)

## 2022-12-28 LAB — TYPE AND SCREEN
ABO/RH(D): B POS
Antibody Screen: NEGATIVE

## 2022-12-28 LAB — SURGICAL PCR SCREEN
MRSA, PCR: NEGATIVE
Staphylococcus aureus: NEGATIVE

## 2022-12-28 LAB — HEMOGLOBIN A1C
Hgb A1c MFr Bld: 9.1 % — ABNORMAL HIGH (ref 4.8–5.6)
Mean Plasma Glucose: 214.47 mg/dL

## 2022-12-28 NOTE — Progress Notes (Signed)
PCP - Dr Nancy Fetter at [facility] - Denies  PPM/ICD - Denies  Chest x-ray - N/A EKG - 12/28/22 Stress Test - 2011? ECHO - Denies Cardiac Cath - Denies  Sleep Study - Denies  Fasting Blood Sugar - 200-220 Checks Blood Sugar __1___ time a day  Blood Thinner Instructions: N/A Aspirin Instructions: N/A  ERAS Protcol - Yes, G2   COVID TEST- N/A   Anesthesia review: No  Patient denies shortness of breath, fever, cough and chest pain at PAT appointment   All instructions explained to the patient, with a verbal understanding of the material. Patient agrees to go over the instructions while at home for a better understanding. Patient also instructed to self quarantine after being tested for COVID-19. The opportunity to ask questions was provided.

## 2023-01-01 ENCOUNTER — Telehealth: Payer: Self-pay | Admitting: Orthopedic Surgery

## 2023-01-01 ENCOUNTER — Ambulatory Visit (HOSPITAL_COMMUNITY): Admission: RE | Admit: 2023-01-01 | Payer: 59 | Source: Home / Self Care | Admitting: Orthopedic Surgery

## 2023-01-01 ENCOUNTER — Encounter: Payer: Self-pay | Admitting: Orthopedic Surgery

## 2023-01-01 ENCOUNTER — Encounter (HOSPITAL_COMMUNITY): Admission: RE | Payer: Self-pay | Source: Home / Self Care

## 2023-01-01 DIAGNOSIS — M5416 Radiculopathy, lumbar region: Secondary | ICD-10-CM

## 2023-01-01 SURGERY — POSTERIOR LUMBAR FUSION 1 LEVEL
Anesthesia: General | Laterality: Bilateral

## 2023-01-01 NOTE — Progress Notes (Signed)
Orthopedic Plan of Care Note  Patient has history of diabetes. His A1C at preadmission testing was 9.1 so his surgery was cancelled. He will need his A1C to be 7.5 or less before surgery can be scheduled. He should follow up in the office to follow his symptoms. Alyse Low spoke to him and he wants something stronger for pain than gabapentin, so offered him pain management but patient refused the referral.   Callie Fielding, MD Orthopedic Surgeon

## 2023-01-01 NOTE — Telephone Encounter (Signed)
Patient states he was supposed to have surgery 12/31/2022 but his BGL was too high and he wants to know when he can reschedule his surgery? Please advise

## 2023-01-01 NOTE — Telephone Encounter (Signed)
I called and advised patient that he A1c was 9.1 on 12/28/22. He states that according to his PCP it has been running 7.8.  I advised that we need it to be below 7.5. He kept asking me how long it would take for him to get it to where it needs to be, I advised that I was not sure, but if he had a strict diet to get it down faster and that he needs to discuss this with his PCP.  He states that they gave him another medication yesterday to take. He asked for pain meds, and I told him that Dr. Laurance Flatten only does pain meds for 6 weeks postop. But I did adise that we could send him to Pain management until he could get his A1c down, and he refused.

## 2023-01-11 ENCOUNTER — Encounter: Payer: Medicare Other | Admitting: Orthopedic Surgery

## 2023-04-05 ENCOUNTER — Ambulatory Visit (INDEPENDENT_AMBULATORY_CARE_PROVIDER_SITE_OTHER): Payer: 59 | Admitting: Orthopedic Surgery

## 2023-04-05 DIAGNOSIS — E119 Type 2 diabetes mellitus without complications: Secondary | ICD-10-CM

## 2023-04-05 DIAGNOSIS — M545 Low back pain, unspecified: Secondary | ICD-10-CM

## 2023-04-05 NOTE — Progress Notes (Signed)
Orthopedic Spine Surgery Office Note  Assessment: Patient is a 77 y.o. male with chronic low back pain with radiating pain into the lateral aspect of his bilateral legs.  It goes to the dorsal aspect of the feet.  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  Accordingly, surgery was canceled.  I explained the rationale for the A1c cutoff of 7.5.  He was frustrated and stated that he has had previous surgery with an elevated A1c.  I told him I could provide him with a referral for a second opinion if he like to see another spine surgeon that may offer him surgery with elevated A1c.  He is not interested in that at this time.  He wanted a repeat check of his A1c.  That was ordered today -I will call him with the results of his A1c and if it is 7.5 or less, we can talk about surgery again  Patient expressed understanding of the plan and all questions were answered to the patient's satisfaction.   ___________________________________________________________________________  History: Patient is a 77 y.o. male who has been previously seen in the office for symptoms of low back pain that radiates into the bilateral lower extremities.  He feels the pain in his severe and that starts in his low back and radiates into the bilateral legs.  He feels it goes over the lateral aspect of the thigh and leg into the dorsal aspect of the foot.  He has tried multiple conservative treatments that did not provide him with any lasting relief.  His pain is at the point that it dictates what he does during the day.  He said he is a lot less active and he like to be because of the pain.  He said he has been working on his blood sugars and notes that they have been better.  He  said he checked today and it was in the 130s.  Previous treatments: gabapentin, Tylenol, NSAIDs, activity modification, physical therapy, injections    Physical Exam:  General: no acute distress, appears stated age Neurologic: alert, answering questions appropriately, following commands Respiratory: unlabored breathing on room air, symmetric chest rise Psychiatric: appropriate affect, normal cadence to speech   MSK (spine):  -Strength exam      Left  Right EHL    4/5  3/5 TA    5/5  5/5 GSC    5/5  5/5 Knee extension  5/5  5/5 Hip flexion   5/5  5/5  -Sensory exam    Sensation intact to light touch in L3-S1 nerve distributions of bilateral lower extremities  -Achilles DTR: 1/4 on the left, 1/4 on the right -Patellar tendon DTR: 1/4 on the left, 1/4 on the right  -Straight leg raise: Negative bilaterally -Femoral nerve stretch test: Negative bilaterally -Clonus: no beats bilaterally  Imaging: XR of the lumbar spine from 09/18/2022 and 09/26/2022 was previously independently reviewed and interpreted, showing L5/S1 interbody device with an anterior plate. There is a lucency through the interbody device. Interbody appears subsided. Less than 1mm change in anterolisthesis between flexion and extension views.    CT of abdomen and pelvis form 02/05/2022 was previously interpreted and showed a lucency through the interbody device. There is lucency around the anterior plate screws. There are 2 slices on  the axial cut where there appears to be autofusion through the right L5/S1 facet joint. Laminectomy defects from L3-S1.    MRI of the lumbar spine from 10/10/2022 was previously independently reviewed and interpreted, showing transitional anatomy. 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 There is also foraminal stenosis at L4/5. Laminectomy defect from L3-S1.    Patient name: Brian Dominguez Patient MRN: [id number] Date of visit: 04/05/23

## 2023-04-06 LAB — HEMOGLOBIN A1C
Hgb A1c MFr Bld: 7.3 % of total Hgb — ABNORMAL HIGH (ref <5.7)
Mean Plasma Glucose: 163 mg/dL
eAG (mmol/L): 9 mmol/L

## 2023-04-10 ENCOUNTER — Telehealth: Payer: Self-pay | Admitting: Orthopedic Surgery

## 2023-04-10 NOTE — Telephone Encounter (Signed)
Patient called wanting to know the results for the A1C from a few days ago. I did go ahead and read him that result. He wanted to know if this was low enough for the surgery to be scheduled. According to [facility]' note it does indeed look like it is which I did inform him. But I told him I would have you all call to discuss this further.

## 2023-04-12 ENCOUNTER — Telehealth: Payer: Self-pay | Admitting: Orthopedic Surgery

## 2023-04-12 NOTE — Telephone Encounter (Signed)
Patient wants to know if his BGL is ok for surgery. Please call patient

## 2023-04-13 ENCOUNTER — Telehealth: Payer: Self-pay | Admitting: Orthopedic Surgery

## 2023-04-13 NOTE — Telephone Encounter (Signed)
Orthopedic Telephone Call  Attempted to get in touch with the patient about his A1c results this weekend.  I called him this morning and left a voicemail.  I told him that I will be back in the office on Monday and we will try him again then.  London Sheer, MD Orthopedic Surgeon

## 2023-04-17 ENCOUNTER — Ambulatory Visit (INDEPENDENT_AMBULATORY_CARE_PROVIDER_SITE_OTHER): Payer: 59 | Admitting: Orthopedic Surgery

## 2023-04-17 DIAGNOSIS — S32009K Unspecified fracture of unspecified lumbar vertebra, subsequent encounter for fracture with nonunion: Secondary | ICD-10-CM

## 2023-04-17 DIAGNOSIS — M5416 Radiculopathy, lumbar region: Secondary | ICD-10-CM

## 2023-04-17 NOTE — Progress Notes (Addendum)
Orthopedic Spine Surgery Office Note  Assessment: Patient is a 77 y.o. male with chronic, progressive low back pain with radicular symptoms into the bilateral lower extremities.  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 He did get good, albeit temporary relief with an L5/S1 transforaminal injection   Plan: -Patient has tried conservative treatments for over 2 years now without any significant relief.  Pain has been getting worse with time.  For that reason, we had previously discussed and plan for surgery but had to cancel because his A1c was over 9.  He has now gotten his blood sugars under better control and his most recent A1c was 7.3.  Accordingly, offered surgery as a treatment option for him.  Discussed a L5 and S1 segment revision decompression and posterior instrumented spinal fusion.  Patient wanted to proceed with that treatment option. -Patient will next be seen at the date of surgery  The patient has symptoms consistent with lumbar radiculopathy. 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 The patient's symptoms were not improving with conservative treatment so operative management was discussed in the form of revision decompression at the L5 and S1 segments with bilateral foraminotomies, partial facetectomies, and L5/S1 posterior instrumented spinal fusion. The risks including but not limited to dural tear, nerve root injury, paralysis, persistent pain, pseudarthrosis, infection, bleeding, hardware failure, adjacent segment disease, heart attack, death, stroke, fracture, blindness, and need for additional procedures were discussed with the patient. I explained that his risk of durotomy and infection would be higher since this would be a revision surgery. The benefit of the surgery would be improvement in his leg pain. I explained that back pain relief is not the goal of the surgery and it is not reliably alleviated  with this surgery. I told him he may get some relief though since I am doing a posterior fusion to address his pseudarthrosis. 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 If that is the cause of his symptoms, revision decompression at L5/S1 would not give him relief. The alternatives to surgical management were covered with the patient and included continued monitoring, physical therapy, over-the-counter pain medications, repeat injections, and activity modification. All the patient's questions were answered to his satisfaction. After this discussion, the patient expressed understanding and elected to proceed with surgical intervention.      ___________________________________________________________________________  History: Patient is a 77 y.o. male who has been previously seen in the office for symptoms consistent with lumbar radiculopathy.  He had previously been scheduled for surgery but his A1c was over 9 at his preoperative appointment, so surgery was canceled.  He has been working on his A1c and his most recent result was 7.3.  He comes in today with continued low back pain radiating into the bilateral lower extremities.  He feels the pain goes into the lateral aspect of his thighs, then into the lateral aspect of the leg, and into the dorsal aspect of the foot.  He feels the pain even at rest.  He has been feeling the pain on a daily basis.  He states he mostly stays at home because of the pain.  He sometimes gets paresthesias along the lateral aspect of his thigh and the legs.  No other numbness or paresthesias.  Denies bowel or bladder incontinence.  No saddle anesthesia.  Of note, has been nicotine free since  fall of 2023.  Previous treatments: gabapentin, Tylenol, NSAIDs, activity modification, physical therapy, lumbar injections  Physical Exam:  BMI of 29.3  General: no acute distress, appears stated  age Neurologic: alert, answering questions appropriately, following commands Respiratory: unlabored breathing on room air, symmetric chest rise Psychiatric: appropriate affect, normal cadence to speech   MSK (spine):  -Strength exam      Left  Right EHL    4/5  3/5 TA    5/5  5/5 GSC    5/5  5/5 Knee extension  5/5  5/5 Hip flexion   5/5  5/5  -Sensory exam    Sensation intact to light touch in L3-S1 nerve distributions of bilateral lower extremities  -Achilles DTR: 1/4 on the left, 1/4 on the right -Patellar tendon DTR: 1/4 on the left, 1/4 on the right  -Straight leg raise: negative bilaterally -Femoral nerve stretch test: negative bilaterally -Clonus: no beats bilaterally -Weakly palpable DP pulses  Imaging: XR of the lumbar spine from 09/18/2022 and 09/26/2022 was previously independently reviewed and interpreted, showing L5/S1 interbody device with an anterior plate. There is a lucency through the interbody device. Interbody appears subsided. Less than 1mm change in anterolisthesis between flexion and extension views.    CT of abdomen and pelvis form 02/05/2022 was previously interpreted and showed a lucency through the interbody device. There is lucency around the anterior plate and screws. There are 2 slices on the axial cut where there appears to be autofusion through the right L5/S1 facet joint. Laminectomy defects from L3-S1.    MRI of the lumbar spine from 10/10/2022 was previously independently reviewed and interpreted, showing transitional anatomy. 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 There is also foraminal stenosis at L4/5. Laminectomy defect from L3-S1. Fatty atrophy of the lower lumbar paraspinal muscles. Clumping of the nerve roots caudal to L5/S1 along the walls of the thecal sac.    Patient name: Brian Dominguez Patient MRN: [id number] Date of visit: 04/17/23

## 2023-04-17 NOTE — Progress Notes (Signed)
Pre-operative Scores  ODI: 27 VAS back: 10 VAS leg: 10 SF-36:  -Physical functioning: 0  -Role limitations due to physical health: 0  -Role limitations due to emotional problems: 0  -Energy/fatigue: 10  -Emotional well-being: 89  -Social functioning: 37.5  -Pain: 12.5  -General health: 21  London Sheer, MD Orthopedic Surgeon

## 2023-05-07 NOTE — Pre-Procedure Instructions (Signed)
Surgical Instructions    Your procedure is scheduled on Tuesday, June 4th.  Report to [facility] Main Entrance "A" at 05:30 A.M., then check in with the Admitting office.  Call this number if you have problems the morning of surgery:  [phone]  If you have any questions prior to your surgery date call [phone]: Open Monday-Friday 8am-4pm If you experience any cold or flu symptoms such as cough, fever, chills, shortness of breath, etc. between now and your scheduled surgery, please notify us at the above number.     Remember:  Do not eat after midnight the night before your surgery  You may drink clear liquids until 04:30 AM the morning of your surgery.   Clear liquids allowed are: Water, Non-Citrus Juices (without pulp), Carbonated Beverages, Clear Tea, Black Coffee Only (NO MILK, CREAM OR POWDERED CREAMER of any kind), and Gatorade.   Patient Instructions  The night before surgery:  No food after midnight. ONLY clear liquids after midnight   The day of surgery (if you have diabetes): Drink ONE (1) 12 oz G2 given to you in your pre admission testing appointment by 04:30 AM the morning of surgery. Drink in one sitting. Do not sip.  This drink was given to you during your hospital  pre-op appointment visit.  Nothing else to drink after completing the  12 oz bottle of G2.         If you have questions, please contact your surgeon's office.     Take these medicines the morning of surgery with A SIP OF WATER  cetirizine (ZYRTEC)  DULoxetine (CYMBALTA)  tamsulosin (FLOMAX)    If needed: fluticasone (FLONASE) spray gabapentin (NEURONTIN)  omeprazole (PRILOSEC)  ondansetron (ZOFRAN-ODT)  oxyCODONE-acetaminophen (PERCOCET)  tiZANidine (ZANAFLEX)    As of today, STOP taking any Aspirin (unless otherwise instructed by your surgeon) Aleve, Naproxen, Ibuprofen, Motrin, Advil, Goody's, BC's, all herbal medications, fish oil, diclofenac Sodium (VOLTAREN) gel, and all  vitamins.  WHAT DO I DO ABOUT MY DIABETES MEDICATION?   Do not take metFORMIN (GLUCOPHAGE) the morning of surgery.  Hold dapagliflozin propanediol (FARXIGA) 72 hours prior to surgery. Last dose 5/31.        HOW TO MANAGE YOUR DIABETES BEFORE AND AFTER SURGERY  Why is it important to control my blood sugar before and after surgery? Improving blood sugar levels before and after surgery helps healing and can limit problems. A way of improving blood sugar control is eating a healthy diet by:  Eating less sugar and carbohydrates  Increasing activity/exercise  Talking with your doctor about reaching your blood sugar goals High blood sugars (greater than 180 mg/dL) can raise your risk of infections and slow your recovery, so you will need to focus on controlling your diabetes during the weeks before surgery. Make sure that the doctor who takes care of your diabetes knows about your planned surgery including the date and location.  How do I manage my blood sugar before surgery? Check your blood sugar at least 4 times a day, starting 2 days before surgery, to make sure that the level is not too high or low.  Check your blood sugar the morning of your surgery when you wake up and every 2 hours until you get to the Short Stay unit.  If your blood sugar is less than 70 mg/dL, you will need to treat for low blood sugar: Do not take insulin. Treat a low blood sugar (less than 70 mg/dL) with  cup of clear juice (  cranberry or apple), 4 glucose tablets, OR glucose gel. Recheck blood sugar in 15 minutes after treatment (to make sure it is greater than 70 mg/dL). If your blood sugar is not greater than 70 mg/dL on recheck, call [phone] for further instructions. Report your blood sugar to the short stay nurse when you get to Short Stay.  If you are admitted to the hospital after surgery: Your blood sugar will be checked by the staff and you will probably be given insulin after surgery (instead  of oral diabetes medicines) to make sure you have good blood sugar levels. The goal for blood sugar control after surgery is 80-180 mg/dL.                     Do NOT Smoke (Tobacco/Vaping) for 24 hours prior to your procedure.  If you use a CPAP at night, you may bring your mask/headgear for your overnight stay.   Contacts, glasses, piercing's, hearing aid's, dentures or partials may not be worn into surgery, please bring cases for these belongings.    For patients admitted to the hospital, discharge time will be determined by your treatment team.   Patients discharged the day of surgery will not be allowed to drive home, and someone needs to stay with them for 24 hours.  SURGICAL WAITING ROOM VISITATION Patients having surgery or a procedure may have no more than 2 support people in the waiting area - these visitors may rotate.   Children under the age of 71 must have an adult with them who is not the patient. If the patient needs to stay at the hospital during part of their recovery, the visitor guidelines for inpatient rooms apply. Pre-op nurse will coordinate an appropriate time for 1 support person to accompany patient in pre-op.  This support person may not rotate.   Please refer to the [facility] website for the visitor guidelines for Inpatients (after your surgery is over and you are in a regular room).      Pre-operative 5 CHG Bath Instructions   You can play a key role in reducing the risk of infection after surgery. Your skin needs to be as free of germs as possible. You can reduce the number of germs on your skin by washing with CHG (chlorhexidine gluconate) soap before surgery. CHG is an antiseptic soap that kills germs and continues to kill germs even after washing.   DO NOT use if you have an allergy to chlorhexidine/CHG or antibacterial soaps. If your skin becomes reddened or irritated, stop using the CHG and notify one of our RNs at [phone].   Please shower with  the CHG soap starting 4 days before surgery using the following schedule:     Please keep in mind the following:  DO NOT shave, including legs and underarms, starting the day of your first shower.   You may shave your face at any point before/day of surgery.  Place clean sheets on your bed the day you start using CHG soap. Use a clean washcloth (not used since being washed) for each shower. DO NOT sleep with pets once you start using the CHG.   CHG Shower Instructions:  If you choose to wash your hair and private area, wash first with your normal shampoo/soap.  After you use shampoo/soap, rinse your hair and body thoroughly to remove shampoo/soap residue.  Turn the water OFF and apply about 3 tablespoons (45 ml) of CHG soap to a CLEAN washcloth.  Apply CHG  soap ONLY FROM YOUR NECK DOWN TO YOUR TOES (washing for 3-5 minutes)  DO NOT use CHG soap on face, private areas, open wounds, or sores.  Pay special attention to the area where your surgery is being performed.  If you are having back surgery, having someone wash your back for you may be helpful. Wait 2 minutes after CHG soap is applied, then you may rinse off the CHG soap.  Pat dry with a clean towel  Put on clean clothes/pajamas   If you choose to wear lotion, please use ONLY the CHG-compatible lotions on the back of this paper.     Additional instructions for the day of surgery: DO NOT APPLY any lotions, deodorants, cologne, or perfumes.   Put on clean/comfortable clothes.  Brush your teeth.  Ask your nurse before applying any prescription medications to the skin. Do not wear jewelry or makeup Do not bring valuables to the hospital. [facility] is not responsible for any belongings or valuables. Do not wear nail polish, gel polish, artificial nails, or any other type of covering on natural nails (fingers and toes) If you have artificial nails or gel coating that need to be removed by a nail salon, please have this removed prior  to surgery. Artificial nails or gel coating may interfere with anesthesia's ability to adequately monitor your vital signs.    CHG Compatible Lotions   Aveeno Moisturizing lotion  Cetaphil Moisturizing Cream  Cetaphil Moisturizing Lotion  Clairol Herbal Essence Moisturizing Lotion, Dry Skin  Clairol Herbal Essence Moisturizing Lotion, Extra Dry Skin  Clairol Herbal Essence Moisturizing Lotion, Normal Skin  Curel Age Defying Therapeutic Moisturizing Lotion with Alpha Hydroxy  Curel Extreme Care Body Lotion  Curel Soothing Hands Moisturizing Hand Lotion  Curel Therapeutic Moisturizing Cream, Fragrance-Free  Curel Therapeutic Moisturizing Lotion, Fragrance-Free  Curel Therapeutic Moisturizing Lotion, Original Formula  Eucerin Daily Replenishing Lotion  Eucerin Dry Skin Therapy Plus Alpha Hydroxy Crme  Eucerin Dry Skin Therapy Plus Alpha Hydroxy Lotion  Eucerin Original Crme  Eucerin Original Lotion  Eucerin Plus Crme Eucerin Plus Lotion  Eucerin TriLipid Replenishing Lotion  Keri Anti-Bacterial Hand Lotion  Keri Deep Conditioning Original Lotion Dry Skin Formula Softly Scented  Keri Deep Conditioning Original Lotion, Fragrance Free Sensitive Skin Formula  Keri Lotion Fast Absorbing Fragrance Free Sensitive Skin Formula  Keri Lotion Fast Absorbing Softly Scented Dry Skin Formula  Keri Original Lotion  Keri Skin Renewal Lotion Keri Silky Smooth Lotion  Keri Silky Smooth Sensitive Skin Lotion  Nivea Body Creamy Conditioning Oil  Nivea Body Extra Enriched Lotion  Nivea Body Original Lotion  Nivea Body Sheer Moisturizing Lotion Nivea Crme  Nivea Skin Firming Lotion  NutraDerm 30 Skin Lotion  NutraDerm Skin Lotion  NutraDerm Therapeutic Skin Cream  NutraDerm Therapeutic Skin Lotion  ProShield Protective Hand Cream  Provon moisturizing lotion        Please read over the following fact sheets that you were given.    If you received a COVID test during your pre-op visit   it is requested that you wear a mask when out in public, stay away from anyone that may not be feeling well and notify your surgeon if you develop symptoms. If you have been in contact with anyone that has tested positive in the last 10 days please notify you surgeon.

## 2023-05-08 ENCOUNTER — Encounter (HOSPITAL_COMMUNITY)
Admission: RE | Admit: 2023-05-08 | Discharge: 2023-05-08 | Disposition: A | Discharge status: Home / Self Care | Payer: 59 | Source: Ambulatory Visit | Attending: Orthopedic Surgery | Admitting: Orthopedic Surgery

## 2023-05-08 ENCOUNTER — Other Ambulatory Visit: Payer: Self-pay

## 2023-05-08 ENCOUNTER — Encounter (HOSPITAL_COMMUNITY): Payer: Self-pay

## 2023-05-08 VITALS — BP 154/83 | HR 96 | Temp 98.0°F | Resp 17 | Ht 74.0 in | Wt 210.0 lb

## 2023-05-08 DIAGNOSIS — F17200 Nicotine dependence, unspecified, uncomplicated: Secondary | ICD-10-CM | POA: Diagnosis not present

## 2023-05-08 DIAGNOSIS — K219 Gastro-esophageal reflux disease without esophagitis: Secondary | ICD-10-CM | POA: Diagnosis not present

## 2023-05-08 DIAGNOSIS — I10 Essential (primary) hypertension: Secondary | ICD-10-CM | POA: Insufficient documentation

## 2023-05-08 DIAGNOSIS — M5417 Radiculopathy, lumbosacral region: Secondary | ICD-10-CM | POA: Diagnosis not present

## 2023-05-08 DIAGNOSIS — E119 Type 2 diabetes mellitus without complications: Secondary | ICD-10-CM | POA: Diagnosis not present

## 2023-05-08 DIAGNOSIS — Z01812 Encounter for preprocedural laboratory examination: Secondary | ICD-10-CM | POA: Insufficient documentation

## 2023-05-08 DIAGNOSIS — Z01818 Encounter for other preprocedural examination: Secondary | ICD-10-CM

## 2023-05-08 DIAGNOSIS — M5416 Radiculopathy, lumbar region: Secondary | ICD-10-CM | POA: Insufficient documentation

## 2023-05-08 LAB — TYPE AND SCREEN
ABO/RH(D): B POS
Antibody Screen: NEGATIVE

## 2023-05-08 LAB — BASIC METABOLIC PANEL
Anion gap: 10 (ref 5–15)
BUN: 13 mg/dL (ref 8–23)
CO2: 25 mmol/L (ref 22–32)
Calcium: 9.5 mg/dL (ref 8.9–10.3)
Chloride: 99 mmol/L (ref 98–111)
Creatinine, Ser: 1.07 mg/dL (ref 0.61–1.24)
GFR, Estimated: >60 mL/min (ref >60)
Glucose, Bld: 153 mg/dL — ABNORMAL HIGH (ref 70–99)
Potassium: 4 mmol/L (ref 3.5–5.1)
Sodium: 134 mmol/L — ABNORMAL LOW (ref 135–145)

## 2023-05-08 LAB — SURGICAL PCR SCREEN
MRSA, PCR: NEGATIVE
Staphylococcus aureus: NEGATIVE

## 2023-05-08 LAB — CBC
HCT: 45.6 % (ref 39.0–52.0)
Hemoglobin: 14.7 g/dL (ref 13.0–17.0)
MCH: 27.6 pg (ref 26.0–34.0)
MCHC: 32.2 g/dL (ref 30.0–36.0)
MCV: 85.6 fL (ref 80.0–100.0)
Platelets: 351 10*3/uL (ref 150–400)
RBC: 5.33 MIL/uL (ref 4.22–5.81)
RDW: 14.4 % (ref 11.5–15.5)
WBC: 5 10*3/uL (ref 4.0–10.5)
nRBC: 0 % (ref 0.0–0.2)

## 2023-05-08 LAB — GLUCOSE, CAPILLARY: Glucose-Capillary: 190 mg/dL — ABNORMAL HIGH (ref 70–99)

## 2023-05-08 NOTE — Progress Notes (Signed)
PCP - Dr. Salli [facility]) Cardiologist - denies  PPM/ICD - denies   Chest x-ray - 12/07/21 EKG - 12/28/22 Stress Test - 2011 per pt, records requested [facility]) ECHO - denies Cardiac Cath - denies  Sleep Study - denies   Fasting Blood Sugar - 120-140 Checks Blood Sugar twice a day  Last dose of GLP1 agonist-  n/a   ASA/Blood Thinner Instructions: n/a   ERAS Protcol - yes PRE-SURGERY G2- given at PAT  COVID TEST- n/a   Anesthesia review: yes, records requested  Patient denies shortness of breath, fever, cough and chest pain at PAT appointment   All instructions explained to the patient, with a verbal understanding of the material. Patient agrees to go over the instructions while at home for a better understanding. The opportunity to ask questions was provided.

## 2023-05-09 ENCOUNTER — Encounter (HOSPITAL_COMMUNITY): Payer: Self-pay | Admitting: Medical

## 2023-05-09 LAB — NICOTINE SCREEN, URINE: Cotinine Ql Scrn, Ur: POSITIVE ng/mL — AB

## 2023-05-09 NOTE — Anesthesia Preprocedure Evaluation (Deleted)
Anesthesia Evaluation    Airway        Dental   Pulmonary Current Smoker          Cardiovascular hypertension,      Neuro/Psych    GI/Hepatic   Endo/Other  diabetes    Renal/GU      Musculoskeletal   Abdominal   Peds  Hematology   Anesthesia Other Findings   Reproductive/Obstetrics                              Anesthesia Physical Anesthesia Plan  ASA:   Anesthesia Plan:    Post-op Pain Management:    Induction:   PONV Risk Score and Plan:   Airway Management Planned:   Additional Equipment:   Intra-op Plan:   Post-operative Plan:   Informed Consent:   Plan Discussed with:   Anesthesia Plan Comments: (See PAT note from 5/29 by Sherlie Ban PA-C )         Anesthesia Quick Evaluation
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
## 2023-05-13 ENCOUNTER — Telehealth: Payer: Self-pay | Admitting: Orthopedic Surgery

## 2023-05-13 NOTE — Telephone Encounter (Signed)
Orthopedic Plan of Care  I called the patient last week to discuss his urine nicotine screening test that was positive. He did not pick up so I left a voicemail instructing him to call me back. He did not return my call. 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 In his posterior lumbar spine there is less surface area at L5/S1 to promote fusion due to prior decompression so pseudarthrosis is still a risk. His risk of infection and wound dehiscence is higher with nicotine use too. The proposed surgery is elective surgery and these complications are potentially preventable if he would quit nicotine. I wanted to talk to him on the phone again about complications from nicotine use and to give him a chance to tell me if he has been using nicotine patches, is still smoking, is getting nicotine from some other source, or this is a false positive test. Since he did not pick up or call me back, I am cancelling his case due to active nicotine use.    London Sheer, MD Orthopedic Surgeon

## 2023-05-14 ENCOUNTER — Telehealth: Payer: Self-pay | Admitting: Orthopedic Surgery

## 2023-05-14 ENCOUNTER — Ambulatory Visit (HOSPITAL_COMMUNITY): Admission: RE | Admit: 2023-05-14 | Payer: 59 | Source: Home / Self Care | Admitting: Orthopedic Surgery

## 2023-05-14 DIAGNOSIS — M5417 Radiculopathy, lumbosacral region: Secondary | ICD-10-CM

## 2023-05-14 SURGERY — POSTERIOR LUMBAR FUSION 1 LEVEL
Anesthesia: General

## 2023-05-14 NOTE — Telephone Encounter (Signed)
Patient called in wanting to know why Dr. Christell Constant canceled his surgery for no reason. Patient was told doctor has been trying to reach him in regards to this matter. Patient would like to know what he needs to do to get surgery rescheduled? Please call.

## 2023-05-14 NOTE — Telephone Encounter (Signed)
I called and lmom advised that Dr. Christell Constant did try to call him at 9:40am this morning, that he as tried to reach him for several days. I also advised that if there is another number that was better to reach to please call the office back and give [place] that number.

## 2023-05-14 NOTE — Telephone Encounter (Signed)
I called and lmom advised that Dr. Moore did try to call him at 9:40am this morning, that he as tried to reach him for several days. I also advised that if there is another number that was better to reach to please call the office back and give us that number.  

## 2023-05-14 NOTE — Telephone Encounter (Signed)
Patient called back in stating he did not receive a call from Dr. Christell Constant and he would like to know how long until he can reschedule his surgery.

## 2023-05-15 ENCOUNTER — Telehealth: Payer: Self-pay | Admitting: Orthopedic Surgery

## 2023-05-15 ENCOUNTER — Ambulatory Visit: Payer: 59 | Admitting: Orthopedic Surgery

## 2023-05-15 NOTE — Telephone Encounter (Signed)
Brian Dominguez called in to cancel his 2pm appt that was scheduled for today.  He stated that he could not come in and would just like Dr. Christell Constant to call him and tell him what he would like him to do.  He mentioned that he was under the impression that Dr. Christell Constant wanted him to stop smoking.  Please call patient at [phone].  If he does not answer, he would like you to leave a detailed message.

## 2023-05-15 NOTE — Telephone Encounter (Signed)
Pt called requesting to speak to Dr Christell Constant. Pt would not state reason for call. Please call pt at [phone]

## 2023-05-15 NOTE — Telephone Encounter (Signed)
Patient coming in today  

## 2023-05-15 NOTE — Telephone Encounter (Signed)
Orthopedic Telephone Call  Called the patient tonight.  He did not pick up.  I left a voicemail.  As he requested, I left more details on it than a standard message.  I explained that his preoperative urine test showed evidence of nicotine use and that was the reason his surgery was canceled.  I told him that this increases his rate of complication particularly pseudoarthrosis and infection.  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 Yevette Edwards did.  I said a spinal cord stimulator may be another treatment option for him.  I told him to call the office if he has questions or wants to discuss the spinal cord stimulator.  Brian Sheer, MD Orthopedic Surgeon

## 2023-05-17 ENCOUNTER — Ambulatory Visit (INDEPENDENT_AMBULATORY_CARE_PROVIDER_SITE_OTHER): Payer: 59 | Admitting: Orthopedic Surgery

## 2023-05-17 DIAGNOSIS — M5416 Radiculopathy, lumbar region: Secondary | ICD-10-CM | POA: Diagnosis not present

## 2023-05-17 NOTE — Progress Notes (Signed)
Orthopedic Spine Surgery Office Note   Assessment: Patient is a 77 y.o. male with chronic, progressive low back pain with radicular symptoms into the bilateral lower extremities.  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       Plan: -Patient was on the OR schedule but his preoperative nicotine screen came back positive.  He had told me preoperatively that he had quit smoking.  Today, he admits that he did start smoking again before surgery.  I went over the reasons why I make patients quit smoking prior to elective spine surgery.  He asked for narcotic pain medication.  I told him I only use those to treat postoperative pain.  He is already established with pain management.  We went over spinal cord stimulator as an option for him.  He has had prior decompressive surgery and an attempt at indirect decompression but is still having radicular pain into his legs.  He was interested in this option, so provided him with a referral to Dr. [place] office    ___________________________________________________________________________   History: Patient is a 77 y.o. male who has been previously seen in the office for symptoms consistent with lumbar radiculopathy.  He has now been canceled twice from surgery for modifiable risk factors including an elevated A1c and active nicotine use.  His most recent surgery was scheduled and his preoperative nicotine test came back positive.  Accordingly, his surgery was canceled.  He comes in today frustrated and says that he has gotten other surgeries while using nicotine.  He is still having low back pain that radiates into his bilateral lower extremities.  There have been no changes in his symptoms since the last office visit.  No new symptoms have developed.   Previous treatments: gabapentin, Tylenol, NSAIDs, activity modification, physical therapy, lumbar injections, pain management   Physical Exam:     General: no acute distress, appears stated age Neurologic: alert, answering questions appropriately, following commands Respiratory: unlabored breathing on room air, symmetric chest rise Psychiatric: appropriate affect, normal cadence to speech     MSK (spine):   -Strength exam                                                   Left                  Right EHL                              4/5                  3/5 TA                                 5/5                  5/5 GSC                             5/5                  5/5 Knee extension  5/5                  5/5 Hip flexion                    5/5                  5/5   -Sensory exam                           Sensation intact to light touch in L3-S1 nerve distributions of bilateral lower extremities    -Straight leg raise: negative bilaterally -Femoral nerve stretch test: negative bilaterally -Clonus: no beats bilaterally   Imaging: XR of the lumbar spine from 09/18/2022 and 09/26/2022 was previously independently reviewed and interpreted, showing L5/S1 interbody device with an anterior plate. There is a lucency through the interbody device. Interbody appears subsided. Less than 1mm change in anterolisthesis between flexion and extension views.    CT of abdomen and pelvis form 02/05/2022 was previously interpreted and showed a lucency through the interbody device. There is lucency around the anterior plate and screws. There are 2 slices on the axial cut where there appears to be autofusion through the right L5/S1 facet joint. Laminectomy defects from L3-S1.    MRI of the lumbar spine from 10/10/2022 was previously independently reviewed and interpreted, showing transitional anatomy. 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 There is also foraminal stenosis at L4/5. Laminectomy defect from L3-S1. Fatty atrophy of the lower lumbar paraspinal muscles. Clumping of the nerve roots caudal to L5/S1  along the walls of the thecal sac.      Patient name: Brian Dominguez Patient MRN: [id number] Date of visit: 05/17/23

## 2023-05-27 ENCOUNTER — Ambulatory Visit (INDEPENDENT_AMBULATORY_CARE_PROVIDER_SITE_OTHER): Payer: 59 | Admitting: Physical Medicine and Rehabilitation

## 2023-05-27 ENCOUNTER — Encounter: Payer: Self-pay | Admitting: Physical Medicine and Rehabilitation

## 2023-05-27 DIAGNOSIS — M961 Postlaminectomy syndrome, not elsewhere classified: Secondary | ICD-10-CM | POA: Diagnosis not present

## 2023-05-27 DIAGNOSIS — M5416 Radiculopathy, lumbar region: Secondary | ICD-10-CM

## 2023-05-27 DIAGNOSIS — G894 Chronic pain syndrome: Secondary | ICD-10-CM | POA: Diagnosis not present
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
## 2023-05-27 NOTE — Progress Notes (Signed)
Functional Pain Scale - descriptive words and definitions  Distressing (6)    Pain is present/unable to complete most ADLs limited by pain/sleep is difficult and active distraction is only marginal. Moderate range order  Average Pain 9  Lower back pain

## 2023-05-29 ENCOUNTER — Encounter: Payer: 59 | Admitting: Orthopedic Surgery

## 2023-06-11 ENCOUNTER — Telehealth: Payer: Self-pay

## 2023-06-11 NOTE — Telephone Encounter (Signed)
SCS submitted to [facility] [employer] portal. Awaiting approval

## 2023-06-14 ENCOUNTER — Telehealth: Payer: Self-pay | Admitting: Physical Medicine and Rehabilitation

## 2023-06-14 NOTE — Telephone Encounter (Signed)
Pt called about an update for approval for stimulator. Please call pt at [phone].

## 2023-06-17 ENCOUNTER — Telehealth: Payer: Self-pay | Admitting: Physical Medicine and Rehabilitation

## 2023-06-17 NOTE — Telephone Encounter (Signed)
Pt called requesting a call back from [place] about back stimulator. Please call this pt as soon as possible. Pt phone number is [phone].

## 2023-06-18 NOTE — Telephone Encounter (Signed)
Spoke with Enrique Sack from [employer] and she is following up with patient

## 2023-06-18 NOTE — Telephone Encounter (Signed)
See previous encounter

## 2023-06-19 ENCOUNTER — Telehealth: Payer: Self-pay | Admitting: Orthopedic Surgery

## 2023-06-19 NOTE — Telephone Encounter (Signed)
Patient called asked if Dr. Christell Constant would call him because he has not received the stimulator yet.  The number to contact patient is [phone]

## 2023-06-20 NOTE — Telephone Encounter (Signed)
LMOM for patient that unfortunately this is a process that takes time

## 2023-06-27 ENCOUNTER — Other Ambulatory Visit: Payer: Self-pay | Admitting: Physical Medicine and Rehabilitation

## 2023-06-27 ENCOUNTER — Telehealth: Payer: Self-pay

## 2023-06-27 DIAGNOSIS — M961 Postlaminectomy syndrome, not elsewhere classified: Secondary | ICD-10-CM

## 2023-06-27 MED ORDER — DIAZEPAM 5 MG PO TABS: ORAL_TABLET | ORAL | 0 refills | Status: DC

## 2023-06-27 NOTE — Telephone Encounter (Signed)
Please send Valium for patient for SCS

## 2023-07-18 ENCOUNTER — Other Ambulatory Visit: Payer: Self-pay

## 2023-07-18 ENCOUNTER — Ambulatory Visit: Payer: 59 | Admitting: Physical Medicine and Rehabilitation

## 2023-07-18 VITALS — BP 128/82 | HR 99

## 2023-07-18 DIAGNOSIS — M961 Postlaminectomy syndrome, not elsewhere classified: Secondary | ICD-10-CM

## 2023-07-18 DIAGNOSIS — M5416 Radiculopathy, lumbar region: Secondary | ICD-10-CM

## 2023-07-18 DIAGNOSIS — G894 Chronic pain syndrome: Secondary | ICD-10-CM

## 2023-07-18 MED ORDER — LIDOCAINE HCL (PF) 1 % IJ SOLN
2.0000 mL | Freq: Once | INTRAMUSCULAR | Status: DC
Start: 2023-07-18 — End: 2024-04-28

## 2023-07-18 NOTE — Progress Notes (Signed)
Brian Dominguez - 77 y.o. male MRN [id number]  Date of birth: [birth year]  Office Visit Note: Visit Date: 07/18/2023 PCP: Pcp, No Referred by: Tyrell Antonio, MD  Subjective: Chief Complaint  Patient presents with   Lower Back - Pain   HPI:  Brian Dominguez is a 77 y.o. male who comes in today for planned spinal cord stimulator trial.  The patient has failed conservative care including PT/home exercise, medications, time and activity modification.  The patient has chronic, severe and recalcitrant low back pain with neurogenic pain and has failed all other interventional spine procedures.  This represents a procedure of last resort for recalcitrant pain. Pre-procedure psychological evaluation has been completed and procedure pre-authorization has been obtained.  Please see requesting physician  and our notes for further details and justification.   Referring: Dr. Willia Craze    ROS Otherwise per HPI.  Assessment & Plan: Visit Diagnoses:    ICD-10-CM   1. Lumbar radiculopathy  M54.16 XR C-ARM NO REPORT    Spinal Cord Stimulator - Analysis    Spinal Cord Stimulator - Placement    lidocaine (PF) (XYLOCAINE) 1 % injection 2 mL    2. Post laminectomy syndrome  M96.1 XR C-ARM NO REPORT    Spinal Cord Stimulator - Analysis    Spinal Cord Stimulator - Placement    lidocaine (PF) (XYLOCAINE) 1 % injection 2 mL    3. Chronic pain syndrome  G89.4 XR C-ARM NO REPORT    Spinal Cord Stimulator - Analysis    Spinal Cord Stimulator - Placement    lidocaine (PF) (XYLOCAINE) 1 % injection 2 mL      Plan: No additional findings.   Meds & Orders:  Meds ordered this encounter  Medications   lidocaine (PF) (XYLOCAINE) 1 % injection 2 mL    Orders Placed This Encounter  Procedures   Spinal Cord Stimulator - Analysis   Spinal Cord Stimulator - Placement   XR C-ARM NO REPORT    Follow-up: Return in about 5 days (around 07/23/2023) for Lead Pull.   Procedures: No procedures performed   Spinal Cord Stimulator Trial  Patient: Brian Dominguez      Date of Birth: 08-09-1946 MRN: [id number] PCP: Pcp, No      Visit Date: 07/18/2023   Universal Protocol:    Date/Time: 08/12/244:56 PM  Consent Given By: the patient  Position: PRONE  Additional Comments: Vital signs were monitored before and after the procedure. Patient was prepped and draped in the usual sterile fashion. The correct patient, procedure, and site was verified.   Injection Procedure Details:  Procedure Site One Meds Administered:  Meds ordered this encounter  Medications   lidocaine (PF) (XYLOCAINE) 1 % injection 2 mL     Location/Site: Leads were placed just to the left and right of midline with the upper most lead at the top of the T7 vertebral body just at the superior endplate and then spanning 3 vertebral bodies with the inferior lead at the bottom of the T9 vertebral body.   Needle size: 14 gauge   Needle type: EPIMED RX Coud Epidural Needle  Needle Placement: T12-L1 Dorsal epidural space  Findings:  -  -Comments: Excellent paresthesia coverage was obtained in all of the areas of the patient's normal pain  Procedure Details: After localization of the T12-L1 epidural space and the overlying soft tissue, the needle was introduced two bodies below this level to produce an adequate angle for epidural penetration. The soft  tissue was infiltrated with 2-3 mls. of 1% Lidocaine without Epinephrine. Using the posterolateral approach, the #14 gauge Epimed needle was inserted down to the posterior aspect of the L1 lamina and then "walked off" the superior aspect of this lamina into the T12-L1 epidural space. The epidural space was localized with loss of resistance and negative aspirate for CSF or blood. The [employer] (16) electrode stimulation lead was passed up so that just the superior most lead was at the location noted above while maintaining the lead in the right of midline.   The  above procedure was repeated for a second Administrator, arts [employer] (16) electrode stimulation lead for the opposite side. The pulse generator system was adjusted and programmed by myself and the company technical representative by varying the stimulator sites, rates, pulse amplitude, and duration. Adequate coverage was obtained as described above.  Subsequent to this, the introducer needle was removed and the spinal cord stimulator trial lead was fastened to the skin using steri-strips and strain reduction loop. The lead wire was then connected and Tegaderm was applied to produce an occlusive dressing. The patient was then brought out of the procedure room and was given a portable stimulator.  The patient will follow-up in three to seven days to determine whether this was efficacious.  Oral prophylactic antibiotic Keflex was prescribed as noted.   Additional Comments:  The patient tolerated the procedure well Dressing: As noted above    Post-procedure details: Patient was observed during the procedure. Post-procedure instructions were reviewed.  Patient left the clinic in stable condition.     Advanced programming analysis performed with the [facility] with physician input and  monitoring.  [employer] FAST and Careers information officer with  combined therapy initiated.    Clinical History: MRI LUMBAR SPINE WITHOUT CONTRAST   TECHNIQUE: Multiplanar, multisequence MR imaging of the lumbar spine was performed. No intravenous contrast was administered.   COMPARISON:  Abdominal CT 02/05/2022.  Lumbar MRI 05/23/2016   FINDINGS: Segmentation:  Rudimentary disc space at L5-S1.   Alignment:  Anterolisthesis at L4-5.   Vertebrae: Laminectomy from L2-L5. Anterior lumbar interbody fusion at L4-5. Extensive spurring at the L4-5 level, pseudoarthrosis findings seen on 02/05/2022 CT. No evidence of fracture or aggressive bone lesion.   Conus  medullaris and cauda equina: Conus extends to the L1-2 level. Conus appears normal. There is an empty sac appearance at the level of L5-S1   Paraspinal and other soft tissues: Postoperative scarring and fatty muscular atrophy at lower lumbar levels.   Disc levels:   T10-11: Only seen on sagittal imaging there is a right paracentral protrusion.   T12- L1: Unremarkable.   L1-L2: Mild disc bulging and facet spurring   L2-L3: Foraminal predominant disc bulging and endplate spurring. Mild facet spurring.   L3-L4: Disc narrowing and circumferential bulging. Bilateral facet spurring. Moderate bilateral foraminal stenosis. Patent spinal canal   L4-L5: Postoperative level as described above. Prominent facet spurring, disc narrowing, and residual disc bulge causes left more than right foraminal stenosis. Pronounced left foraminal impingement   L5-S1:Incomplete segmentation.  No degeneration or impingement   IMPRESSION: 1. Cage subsidence and bulky ventral spurring at the L4-5 ALIF, pseudoarthrosis findings by CT earlier this year. 2. Extensive laminectomy with patent spinal canal. 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 3. 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     Electronically Signed   By: Tiburcio Pea M.D.   On: 10/12/2022  07:35     Objective:  VS:  HT:    WT:   BMI:     BP:128/82  HR:99bpm  TEMP: ( )  RESP:  Physical Exam Vitals and nursing note reviewed.  Constitutional:      General: He is not in acute distress.    Appearance: Normal appearance. He is not ill-appearing.  HENT:     Head: Normocephalic and atraumatic.     Right Ear: External ear normal.     Left Ear: External ear normal.     Nose: No congestion.  Eyes:     Extraocular Movements: Extraocular movements intact.  Cardiovascular:     Rate and Rhythm: Normal rate.     Pulses: Normal pulses.  Pulmonary:     Effort: Pulmonary effort is normal. No respiratory  distress.  Abdominal:     General: There is no distension.     Palpations: Abdomen is soft.  Musculoskeletal:        General: No tenderness or signs of injury.     Cervical back: Neck supple.     Right lower leg: No edema.     Left lower leg: No edema.     Comments: Patient has good distal strength without clonus.  Skin:    Findings: No erythema or rash.  Neurological:     General: No focal deficit present.     Mental Status: He is alert and oriented to person, place, and time.     Sensory: Sensory deficit present.     Motor: No weakness or abnormal muscle tone.     Coordination: Coordination normal.     Gait: Gait abnormal.  Psychiatric:        Mood and Affect: Mood normal.        Behavior: Behavior normal.      Imaging: No results found.

## 2023-07-18 NOTE — Progress Notes (Signed)
Functional Pain Scale - descriptive words and definitions Unmanageable (7)  Pain interferes with normal ADL's/nothing seems to help/sleep is very difficult/active distractions are very difficult to concentrate on. Severe range order  Average Pain 8   +Driver, -BT, -Dye Allergies.  Lower back pain. SCS trial

## 2023-07-22 ENCOUNTER — Ambulatory Visit: Payer: 59 | Admitting: Physical Medicine and Rehabilitation

## 2023-07-22 DIAGNOSIS — M5416 Radiculopathy, lumbar region: Secondary | ICD-10-CM

## 2023-07-22 DIAGNOSIS — M961 Postlaminectomy syndrome, not elsewhere classified: Secondary | ICD-10-CM

## 2023-07-22 DIAGNOSIS — G894 Chronic pain syndrome: Secondary | ICD-10-CM

## 2023-07-22 NOTE — Procedures (Signed)
Spinal Cord Stimulator Trial  Patient: Brian Dominguez      Date of Birth: May 05, 1946 MRN: [id number] PCP: Pcp, No      Visit Date: 07/18/2023   Universal Protocol:    Date/Time: 08/12/244:56 PM  Consent Given By: the patient  Position: PRONE  Additional Comments: Vital signs were monitored before and after the procedure. Patient was prepped and draped in the usual sterile fashion. The correct patient, procedure, and site was verified.   Injection Procedure Details:  Procedure Site One Meds Administered:  Meds ordered this encounter  Medications   lidocaine (PF) (XYLOCAINE) 1 % injection 2 mL     Location/Site: Leads were placed just to the left and right of midline with the upper most lead at the top of the T7 vertebral body just at the superior endplate and then spanning 3 vertebral bodies with the inferior lead at the bottom of the T9 vertebral body.   Needle size: 14 gauge   Needle type: EPIMED RX Coud Epidural Needle  Needle Placement: T12-L1 Dorsal epidural space  Findings:  -  -Comments: Excellent paresthesia coverage was obtained in all of the areas of the patient's normal pain  Procedure Details: After localization of the T12-L1 epidural space and the overlying soft tissue, the needle was introduced two bodies below this level to produce an adequate angle for epidural penetration. The soft tissue was infiltrated with 2-3 mls. of 1% Lidocaine without Epinephrine. Using the posterolateral approach, the #14 gauge Epimed needle was inserted down to the posterior aspect of the L1 lamina and then "walked off" the superior aspect of this lamina into the T12-L1 epidural space. The epidural space was localized with loss of resistance and negative aspirate for CSF or blood. The [employer] (16) electrode stimulation lead was passed up so that just the superior most lead was at the location noted above while maintaining the lead in the right of midline.   The  above procedure was repeated for a second Administrator, arts [employer] (16) electrode stimulation lead for the opposite side. The pulse generator system was adjusted and programmed by myself and the company technical representative by varying the stimulator sites, rates, pulse amplitude, and duration. Adequate coverage was obtained as described above.  Subsequent to this, the introducer needle was removed and the spinal cord stimulator trial lead was fastened to the skin using steri-strips and strain reduction loop. The lead wire was then connected and Tegaderm was applied to produce an occlusive dressing. The patient was then brought out of the procedure room and was given a portable stimulator.  The patient will follow-up in three to seven days to determine whether this was efficacious.  Oral prophylactic antibiotic Keflex was prescribed as noted.   Additional Comments:  The patient tolerated the procedure well Dressing: As noted above    Post-procedure details: Patient was observed during the procedure. Post-procedure instructions were reviewed.  Patient left the clinic in stable condition.

## 2023-07-22 NOTE — Procedures (Signed)
Advanced programming analysis performed with the [employer] technical representative with physician input and  monitoring. [employer] FAST and contour programming with  combined therapy initiated. 

## 2023-07-24 ENCOUNTER — Ambulatory Visit (INDEPENDENT_AMBULATORY_CARE_PROVIDER_SITE_OTHER): Payer: 59 | Admitting: Physical Medicine and Rehabilitation

## 2023-07-24 DIAGNOSIS — M5416 Radiculopathy, lumbar region: Secondary | ICD-10-CM

## 2023-07-24 DIAGNOSIS — G894 Chronic pain syndrome: Secondary | ICD-10-CM

## 2023-07-24 DIAGNOSIS — M961 Postlaminectomy syndrome, not elsewhere classified: Secondary | ICD-10-CM

## 2023-07-27 ENCOUNTER — Encounter: Payer: Self-pay | Admitting: Physical Medicine and Rehabilitation

## 2023-07-27 NOTE — Progress Notes (Signed)
Patient been in contact with [employer] with decreased left sided symptom relief. Here today for reprogramming with Matt from [facility]

## 2023-07-31 ENCOUNTER — Encounter: Payer: Self-pay | Admitting: Physical Medicine and Rehabilitation

## 2023-07-31 ENCOUNTER — Telehealth: Payer: Self-pay | Admitting: Physical Medicine and Rehabilitation

## 2023-07-31 NOTE — Progress Notes (Signed)
Brian Dominguez - 77 y.o. male MRN [id number]  Date of birth: Oct 20, 1946  Office Visit Note: Visit Date: 07/24/2023 PCP: Pcp, No Referred by: No ref. provider found  Subjective: Chief Complaint  Patient presents with   Lower Back - Pain   HPI:  Brian Dominguez is a 77 y.o. male who comes in today for spinal cord stimulator lead pull and management of chronic worsening severe pain which has been recalcitrant to previous treatment.  He reports greater than 60% pain relief with the stimulator trial and is very pleased with the amount of relief and functional increase he has obtained.  He is able to walk and do more with the stimulator in place.  He does want to move forward with implant.  Quick procedure note: Patient was placed prone on the exam table. The external stimulator/generator was unfastened from the leads. The outer Tegaderm was removed from the bandage carefully. There was no noted erythema of the skin or swelling or induration or drainage. There had been some bloody discharge in the gauze pad. Both trial leads were pulled without difficulty and without discomfort. There was no drainage from the insertion site. Band-Aid was applied.    ROS Otherwise per HPI.  Assessment & Plan: Visit Diagnoses:    ICD-10-CM   1. Lumbar radiculopathy  M54.16 Ambulatory referral to Anesthesiology    2. Post laminectomy syndrome  M96.1 Ambulatory referral to Anesthesiology    3. Chronic pain syndrome  G89.4 Ambulatory referral to Anesthesiology      Plan: No additional findings.   Meds & Orders: No orders of the defined types were placed in this encounter.   Orders Placed This Encounter  Procedures   Ambulatory referral to Anesthesiology    Follow-up: No follow-ups on file.   Procedures: No procedures performed      Clinical History: MRI LUMBAR SPINE WITHOUT CONTRAST   TECHNIQUE: Multiplanar, multisequence MR imaging of the lumbar spine was performed. No intravenous contrast was  administered.   COMPARISON:  Abdominal CT 02/05/2022.  Lumbar MRI 05/23/2016   FINDINGS: Segmentation:  Rudimentary disc space at L5-S1.   Alignment:  Anterolisthesis at L4-5.   Vertebrae: Laminectomy from L2-L5. Anterior lumbar interbody fusion at L4-5. Extensive spurring at the L4-5 level, pseudoarthrosis findings seen on 02/05/2022 CT. No evidence of fracture or aggressive bone lesion.   Conus medullaris and cauda equina: Conus extends to the L1-2 level. Conus appears normal. There is an empty sac appearance at the level of L5-S1   Paraspinal and other soft tissues: Postoperative scarring and fatty muscular atrophy at lower lumbar levels.   Disc levels:   T10-11: Only seen on sagittal imaging there is a right paracentral protrusion.   T12- L1: Unremarkable.   L1-L2: Mild disc bulging and facet spurring   L2-L3: Foraminal predominant disc bulging and endplate spurring. Mild facet spurring.   L3-L4: Disc narrowing and circumferential bulging. Bilateral facet spurring. Moderate bilateral foraminal stenosis. Patent spinal canal   L4-L5: Postoperative level as described above. Prominent facet spurring, disc narrowing, and residual disc bulge causes left more than right foraminal stenosis. Pronounced left foraminal impingement   L5-S1:Incomplete segmentation.  No degeneration or impingement   IMPRESSION: 1. Cage subsidence and bulky ventral spurring at the L4-5 ALIF, pseudoarthrosis findings by CT earlier this year. 2. Extensive laminectomy with patent spinal canal. 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 3. 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     Electronically Signed  By: Tiburcio Pea M.D.   On: 10/12/2022 07:35     Objective:  VS:  HT:    WT:   BMI:     BP:   HR: bpm  TEMP: ( )  RESP:  Physical Exam Vitals and nursing note reviewed.  Constitutional:      General: He is not in acute distress.    Appearance: Normal  appearance. He is well-developed.  HENT:     Head: Normocephalic and atraumatic.  Eyes:     Conjunctiva/sclera: Conjunctivae normal.     Pupils: Pupils are equal, round, and reactive to light.  Cardiovascular:     Rate and Rhythm: Normal rate.     Pulses: Normal pulses.     Heart sounds: Normal heart sounds.  Pulmonary:     Effort: Pulmonary effort is normal. No respiratory distress.  Musculoskeletal:     Cervical back: Normal range of motion and neck supple. No rigidity.     Right lower leg: No edema.     Left lower leg: No edema.  Skin:    General: Skin is warm and dry.     Findings: No erythema or rash.     Comments: Area around injection site was not indurated red or swollen and there was no drainage.  Neurological:     General: No focal deficit present.     Mental Status: He is alert and oriented to person, place, and time.     Cranial Nerves: No cranial nerve deficit.     Sensory: No sensory deficit.     Motor: No weakness.     Coordination: Coordination normal.     Gait: Gait normal.  Psychiatric:        Mood and Affect: Mood normal.        Behavior: Behavior normal.      Imaging: No results found.

## 2023-07-31 NOTE — Telephone Encounter (Signed)
Patient called wanting to know when is he going to get the stimulator in his back?CB#[phone]

## 2023-08-01 ENCOUNTER — Telehealth: Payer: Self-pay | Admitting: *Deleted

## 2023-08-01 NOTE — Telephone Encounter (Signed)
Informed Enrique Sack with [employer]

## 2023-08-01 NOTE — Addendum Note (Signed)
Addended by: Ashok Norris on: 08/01/2023 08:06 AM   Modules accepted: Orders

## 2023-08-01 NOTE — Telephone Encounter (Signed)
-----   Message from Naaman Plummer sent at 08/01/2023  8:05 AM EDT ----- I had originally placed referral for this patient to Dr. Lorrine Kin for spinal cord stimulator implant but need to change to Dr. Yevette Edwards, new referral placed today.

## 2023-08-01 NOTE — Telephone Encounter (Signed)
I cancelled the previous referral to [facility]

## 2023-08-01 NOTE — Telephone Encounter (Signed)
-----   Message from Ong, [place] E sent at 08/01/2023  8:29 AM EDT ----- Hold off for now. Thanks ----- Message ----- From: Samuella Cota, CMA Sent: 08/01/2023   8:25 AM EDT To: Juanda Chance, NP  So dont send to [facility] now? ----- Message ----- From: Juanda Chance, NP Sent: 08/01/2023   8:11 AM EDT To: Tyrell Antonio, MD; Samuella Cota, CMA; #  Dumonski did his first surgery, so may want to hold on sending referral at this time. ----- Message ----- From: Tyrell Antonio, MD Sent: 08/01/2023   8:06 AM EDT To: Juanda Chance, NP; Samuella Cota, CMA; #  I had originally placed referral for this patient to Dr. Lorrine Kin for spinal cord stimulator implant but need to change to Dr. Yevette Edwards, new referral placed today.

## 2023-08-05 ENCOUNTER — Telehealth: Payer: Self-pay | Admitting: Physical Medicine and Rehabilitation

## 2023-08-05 NOTE — Telephone Encounter (Signed)
Enrique Sack from [employer] contacted to called patient

## 2023-08-05 NOTE — Telephone Encounter (Signed)
Patient called returning your call. CB#[phone]

## 2023-08-13 ENCOUNTER — Emergency Department (HOSPITAL_COMMUNITY): Payer: 59

## 2023-08-13 ENCOUNTER — Other Ambulatory Visit: Payer: Self-pay

## 2023-08-13 ENCOUNTER — Emergency Department (HOSPITAL_COMMUNITY)
Admission: EM | Admit: 2023-08-13 | Discharge: 2023-08-13 | Disposition: A | Discharge status: Home / Self Care | Payer: 59 | Attending: Emergency Medicine | Admitting: Emergency Medicine

## 2023-08-13 DIAGNOSIS — I10 Essential (primary) hypertension: Secondary | ICD-10-CM | POA: Diagnosis not present

## 2023-08-13 DIAGNOSIS — N3000 Acute cystitis without hematuria: Secondary | ICD-10-CM | POA: Insufficient documentation

## 2023-08-13 DIAGNOSIS — Z79899 Other long term (current) drug therapy: Secondary | ICD-10-CM | POA: Diagnosis not present

## 2023-08-13 DIAGNOSIS — K219 Gastro-esophageal reflux disease without esophagitis: Secondary | ICD-10-CM | POA: Insufficient documentation

## 2023-08-13 DIAGNOSIS — R1013 Epigastric pain: Secondary | ICD-10-CM | POA: Diagnosis present

## 2023-08-13 DIAGNOSIS — E119 Type 2 diabetes mellitus without complications: Secondary | ICD-10-CM | POA: Insufficient documentation

## 2023-08-13 DIAGNOSIS — K5904 Chronic idiopathic constipation: Secondary | ICD-10-CM | POA: Diagnosis not present

## 2023-08-13 DIAGNOSIS — Z7984 Long term (current) use of oral hypoglycemic drugs: Secondary | ICD-10-CM | POA: Diagnosis not present

## 2023-08-13 LAB — URINALYSIS, ROUTINE W REFLEX MICROSCOPIC
Bilirubin Urine: NEGATIVE
Glucose, UA: >=500 mg/dL — AB
Hgb urine dipstick: NEGATIVE
Ketones, ur: NEGATIVE mg/dL
Nitrite: NEGATIVE
Protein, ur: NEGATIVE mg/dL
Specific Gravity, Urine: 1.027 (ref 1.005–1.030)
pH: 5 (ref 5.0–8.0)

## 2023-08-13 LAB — COMPREHENSIVE METABOLIC PANEL
ALT: 15 U/L (ref 0–44)
AST: 17 U/L (ref 15–41)
Albumin: 4.1 g/dL (ref 3.5–5.0)
Alkaline Phosphatase: 47 U/L (ref 38–126)
Anion gap: 12 (ref 5–15)
BUN: 11 mg/dL (ref 8–23)
CO2: 27 mmol/L (ref 22–32)
Calcium: 9.8 mg/dL (ref 8.9–10.3)
Chloride: 98 mmol/L (ref 98–111)
Creatinine, Ser: 1.11 mg/dL (ref 0.61–1.24)
GFR, Estimated: >60 mL/min (ref >60)
Glucose, Bld: 150 mg/dL — ABNORMAL HIGH (ref 70–99)
Potassium: 3.5 mmol/L (ref 3.5–5.1)
Sodium: 137 mmol/L (ref 135–145)
Total Bilirubin: 0.6 mg/dL (ref 0.3–1.2)
Total Protein: 7.7 g/dL (ref 6.5–8.1)

## 2023-08-13 LAB — CBC
HCT: 43.1 % (ref 39.0–52.0)
Hemoglobin: 14 g/dL (ref 13.0–17.0)
MCH: 26.9 pg (ref 26.0–34.0)
MCHC: 32.5 g/dL (ref 30.0–36.0)
MCV: 82.7 fL (ref 80.0–100.0)
Platelets: 343 10*3/uL (ref 150–400)
RBC: 5.21 MIL/uL (ref 4.22–5.81)
RDW: 14.2 % (ref 11.5–15.5)
WBC: 6.7 10*3/uL (ref 4.0–10.5)
nRBC: 0 % (ref 0.0–0.2)

## 2023-08-13 LAB — TROPONIN I (HIGH SENSITIVITY)
Troponin I (High Sensitivity): 8 ng/L (ref <18)
Troponin I (High Sensitivity): 8 ng/L (ref <18)

## 2023-08-13 LAB — LIPASE, BLOOD: Lipase: 55 U/L — ABNORMAL HIGH (ref 11–51)

## 2023-08-13 MED ORDER — CEPHALEXIN 500 MG PO CAPS
500.0000 mg | ORAL_CAPSULE | Freq: Two times a day (BID) | ORAL | 0 refills | Status: AC
Start: 2023-08-13 — End: 2023-08-18

## 2023-08-13 MED ORDER — POLYETHYLENE GLYCOL 3350 17 GM/SCOOP PO POWD: 1.0000 | Freq: Two times a day (BID) | ORAL | 0 refills | Status: DC

## 2023-08-13 MED ORDER — SUCRALFATE 1 GM/10ML PO SUSP: 1.0000 g | Freq: Once | ORAL | Status: DC

## 2023-08-13 MED ORDER — CEPHALEXIN 250 MG PO CAPS
500.0000 mg | ORAL_CAPSULE | Freq: Once | ORAL | Status: AC
  Administered 2023-08-13: 500 mg via ORAL
  Filled 2023-08-13: qty 2

## 2023-08-13 MED ORDER — ALUM & MAG HYDROXIDE-SIMETH 200-200-20 MG/5ML PO SUSP
15.0000 mL | Freq: Once | ORAL | Status: AC
  Administered 2023-08-13: 15 mL via ORAL
  Filled 2023-08-13: qty 30

## 2023-08-13 MED ORDER — IOHEXOL 350 MG/ML SOLN
75.0000 mL | Freq: Once | INTRAVENOUS | Status: AC | PRN
  Administered 2023-08-13: 75 mL via INTRAVENOUS

## 2023-08-13 MED ORDER — ACETAMINOPHEN 325 MG PO TABS
650.0000 mg | ORAL_TABLET | Freq: Once | ORAL | Status: AC
  Administered 2023-08-13: 650 mg via ORAL
  Filled 2023-08-13: qty 2

## 2023-08-13 NOTE — ED Triage Notes (Signed)
Pt endorses hematuria since last Wednesday and epigastric pain radiating to R back; pain described as throbbing, endorses nausea, denies diaphoresis; hx DM, HTN, GERD

## 2023-08-13 NOTE — Discharge Instructions (Signed)
It was a pleasure caring for you today. Lab workup concerning for UTI. You were given a dose of antibiotics here in ED and I have sent the rest of your prescription to your pharmacy. Seek emergency care if experiencing any new or worsening symptoms.

## 2023-08-13 NOTE — ED Provider Notes (Signed)
[facility] EMERGENCY DEPARTMENT AT [facility] Provider Note   CSN: [id number] Arrival date & time: 08/13/23  1031     History  Chief Complaint  Patient presents with   Abdominal Pain   Hematuria    Brian Dominguez is a 77 y.o. male with PMHx diverticulosis, DM, GERD, HTN, OA who presents to the ED concerned for right sided flank pain x2 months and hematuria/dysuria x6 days. Patient also endorsing intermittent epigastric pain that is baseline for him d/t GERD. Last BM 2 days ago.  Denies fever, chest pain, dyspnea, vomiting, diarrhea, hematochezia.  Abdominal Pain Associated symptoms: hematuria   Hematuria Associated symptoms include abdominal pain.       Home Medications Prior to Admission medications   Medication Sig Start Date End Date Taking? Authorizing Provider  cephALEXin (KEFLEX) 500 MG capsule Take 1 capsule (500 mg total) by mouth 2 (two) times daily for 5 days. 08/13/23 08/18/23 Yes Deshannon Seide, Charlotte Sanes F, PA-C  atorvastatin (LIPITOR) 20 MG tablet Take 1 tablet (20 mg total) by mouth daily. Patient taking differently: Take 20 mg by mouth at bedtime. 08/01/20   Mirian Mo, MD  cetirizine (ZYRTEC) 10 MG tablet Take 1 tablet (10 mg total) by mouth daily. 09/10/18   Howard Pouch, MD  clotrimazole (LOTRIMIN) 1 % cream Apply 1 Application topically 2 (two) times daily as needed. 07/08/23   [provider]  dapagliflozin propanediol (FARXIGA) 10 MG TABS tablet Take 10 mg by mouth daily.    [provider]  diazepam (VALIUM) 5 MG tablet Take one tablet by mouth with food one hour prior to procedure. May repeat 30 minutes prior if needed. 06/27/23   Juanda Chance, NP  diclofenac Sodium (VOLTAREN) 1 % GEL Apply 1 Application topically 4 (four) times daily as needed (pain).    [provider]  DULoxetine (CYMBALTA) 60 MG capsule Take 120 mg by mouth daily.    [provider]  fluticasone (FLONASE) 50 MCG/ACT nasal spray Place 2 sprays  into both nostrils daily as needed for allergies. 10/29/22   [provider]  gabapentin (NEURONTIN) 300 MG capsule Take 300 mg by mouth 3 (three) times daily as needed (pain).    [provider]  losartan-hydrochlorothiazide (HYZAAR) 50-12.5 MG tablet Take 1 tablet by mouth daily.    [provider]  metFORMIN (GLUCOPHAGE) 1000 MG tablet TAKE 1 TABLET (1,000 MG TOTAL) BY MOUTH 2 (TWO) TIMES DAILY WITH A MEAL. 05/29/21   Cresenzo, Cyndi Lennert, MD  naloxone [facility]) nasal spray 4 mg/0.1 mL Place 1 spray into the nose once.    [provider]  omeprazole (PRILOSEC) 40 MG capsule Take 40 mg by mouth 2 (two) times daily as needed (acid reflux).    [provider]  ondansetron (ZOFRAN-ODT) 4 MG disintegrating tablet Take 1 tablet (4 mg total) by mouth every 8 (eight) hours as needed for nausea or vomiting. 02/05/22   Jacalyn Lefevre, MD  oxyCODONE-acetaminophen (PERCOCET) 10-325 MG tablet Take 1 tablet by mouth 4 (four) times daily as needed for pain. 04/27/23   [provider]  polyethylene glycol powder (GLYCOLAX/MIRALAX) 17 GM/SCOOP powder Take 255 g by mouth 2 (two) times daily. Until daily soft stools  OTC 08/13/23   Valrie Hart F, PA-C  tamsulosin (FLOMAX) 0.4 MG CAPS capsule Take 0.4 mg by mouth daily.    [provider]  tiZANidine (ZANAFLEX) 4 MG tablet Take 4 mg by mouth 3 (three) times daily as needed for muscle  spasms. 10/31/22   [provider]      Allergies    Enalapril    Review of Systems   Review of Systems  Gastrointestinal:  Positive for abdominal pain.  Genitourinary:  Positive for hematuria.    Physical Exam Updated Vital Signs BP 137/77   Pulse 91   Temp 98.7 F (37.1 C)   Resp (!) 24   Ht 6\' 1"  (1.854 m)   Wt 97.5 kg   SpO2 99%   BMI 28.37 kg/m  Physical Exam Vitals and nursing note reviewed.  Constitutional:      General: He is not in acute distress.    Appearance: He is not ill-appearing  or toxic-appearing.  HENT:     Head: Normocephalic and atraumatic.     Mouth/Throat:     Mouth: Mucous membranes are moist.     Pharynx: No oropharyngeal exudate or posterior oropharyngeal erythema.  Eyes:     General: No scleral icterus.       Right eye: No discharge.        Left eye: No discharge.     Conjunctiva/sclera: Conjunctivae normal.  Cardiovascular:     Rate and Rhythm: Normal rate and regular rhythm.     Pulses: Normal pulses.     Heart sounds: Normal heart sounds. No murmur heard. Pulmonary:     Effort: Pulmonary effort is normal.  Abdominal:     General: Abdomen is flat. Bowel sounds are normal. There is no distension.     Palpations: Abdomen is soft. There is no mass.     Tenderness: There is no abdominal tenderness. There is no right CVA tenderness or left CVA tenderness.  Musculoskeletal:     Right lower leg: No edema.     Left lower leg: No edema.  Skin:    General: Skin is warm and dry.     Findings: No rash.  Neurological:     General: No focal deficit present.     Mental Status: He is alert. Mental status is at baseline.  Psychiatric:        Mood and Affect: Mood normal.        Behavior: Behavior normal.     ED Results / Procedures / Treatments   Labs (all labs ordered are listed, but only abnormal results are displayed) Labs Reviewed  LIPASE, BLOOD - Abnormal; Notable for the following components:      Result Value   Lipase 55 (*)    All other components within normal limits  COMPREHENSIVE METABOLIC PANEL - Abnormal; Notable for the following components:   Glucose, Bld 150 (*)    All other components within normal limits  URINALYSIS, ROUTINE W REFLEX MICROSCOPIC - Abnormal; Notable for the following components:   APPearance HAZY (*)    Glucose, UA >=500 (*)    Leukocytes,Ua SMALL (*)    Bacteria, UA RARE (*)    All other components within normal limits  URINE CULTURE  CBC  TROPONIN I (HIGH SENSITIVITY)  TROPONIN I (HIGH SENSITIVITY)     EKG EKG Interpretation Date/Time:  Tuesday August 13 2023 17:02:27 EDT Ventricular Rate:  90 PR Interval:  174 QRS Duration:  107 QT Interval:  349 QTC Calculation: 427 R Axis:   50  Text Interpretation: Sinus rhythm Incomplete left bundle branch block No significant change since last tracing Confirmed by Richardean Canal [phone]) on 08/13/2023 5:29:28 PM  Radiology CT ABDOMEN PELVIS W CONTRAST  Result Date: 08/13/2023 CLINICAL DATA:  Abdominal  pain, acute nonlocalized. EXAM: CT ABDOMEN AND PELVIS WITH CONTRAST TECHNIQUE: Multidetector CT imaging of the abdomen and pelvis was performed using the standard protocol following bolus administration of intravenous contrast. RADIATION DOSE REDUCTION: This exam was performed according to the departmental dose-optimization program which includes automated exposure control, adjustment of the mA and/or kV according to patient size and/or use of iterative reconstruction technique. CONTRAST:  75mL OMNIPAQUE IOHEXOL 350 MG/ML SOLN COMPARISON:  Comparison FINDINGS: Lower chest: Lung bases are clear. Elevation of LEFT hemidiaphragm. Hepatobiliary: No focal hepatic lesion. Postcholecystectomy. No biliary dilatation. Pancreas: Pancreas is normal. No ductal dilatation. No pancreatic inflammation. Spleen: Surgical clips adjacent the spleen. Adrenals/urinary tract: Adrenal glands and kidneys are normal. The ureters and bladder normal. Stomach/Bowel: Stomach, small bowel, appendix, and cecum are normal. Moderate volume stool in the ascending colon. Multiple diverticula of the descending colon and sigmoid colon without acute inflammation. Sigmoid colon is tortuous. No obstruction. Rectum Vascular/Lymphatic: Abdominal aorta is normal caliber with atherosclerotic calcification. There is no retroperitoneal or periportal lymphadenopathy. No pelvic lymphadenopathy. Reproductive: Prostate unremarkable Other: No free fluid. Musculoskeletal: No aggressive osseous lesion.  IMPRESSION: 1. Chronic elevation LEFT hemidiaphragm. 2. No acute findings in the abdomen or pelvis Electronically Signed   By: Genevive Bi M.D.   On: 08/13/2023 15:14    Procedures Procedures    Medications Ordered in ED Medications  cephALEXin (KEFLEX) capsule 500 mg (has no administration in time range)  acetaminophen (TYLENOL) tablet 650 mg (has no administration in time range)  alum & mag hydroxide-simeth (MAALOX/MYLANTA) 200-200-20 MG/5ML suspension 15 mL (15 mLs Oral Given 08/13/23 1256)  iohexol (OMNIPAQUE) 350 MG/ML injection 75 mL (75 mLs Intravenous Contrast Given 08/13/23 1337)    ED Course/ Medical Decision Making/ A&P                                 Medical Decision Making Amount and/or Complexity of Data Reviewed Labs: ordered. Radiology: ordered.  Risk OTC drugs. Prescription drug management.    This patient presents to the ED for concern of abdominal pain, hematuria, and dysuria, this involves an extensive number of treatment options, and is a complaint that carries with it a high risk of complications and morbidity.  The differential diagnosis includes gastroenteritis, colitis, small bowel obstruction, appendicitis, cholecystitis, pancreatitis, nephrolithiasis, UTI, pyelonephritis   Co morbidities that complicate the patient evaluation  diverticulosis, DM, GERD, HTN, OA    Lab Tests:  I Ordered, and personally interpreted labs.  The pertinent results include: -CBC with differential: No concern for anemia or leukocytosis -CMP: no concern for electrolyte abnormality; no concern for kidney/liver damage -Lipase: mildly elevated at 55 -UA: small leukocytes, rare bacteria -Troponin: initial and repeat within normal limits    Imaging Studies ordered:  I ordered imaging studies including   CT Abd/Pelvis with contrast: evaluate for structural/surgical etiology of patients' severe abdominal pain.  I independently visualized and interpreted imaging I agree  with the radiologist interpretation   Cardiac Monitoring: / EKG:  The patient was maintained on a cardiac monitor.  I personally viewed and interpreted the cardiac monitored which showed an underlying rhythm of: sinus rhythm without acute ST changes or arrhythmias   Problem List / ED Course / Critical interventions / Medication management  Patient presented for left sided flank pain, hematuria, and dysuria.  Physical exam unremarkable.  Patient afebrile with stable vitals. CMP without electrolyte abnormalities.  BUN/Cr within normal limits.  Liver  enzymes within normal limits. lipase mildly elevated at 55.  CBC without leukocytosis or anemia.  Initial and repeat troponin within normal limits.  UA mildly concerning for UTI with small leukocytes and rare bacteria. UA also with glucose which appears to be d/t Dapagliflozin for DM. UA is not overly concerning for UTI, but given patient's intermittent report of hematuria/dysuria and concern for UTI, I have started his ABX course in ED. Patient tolerated keflex well in ED and I have sent the rest of his prescription to his pharmacy. Urine culture pending. Physical exam without concerning signs of pyelonephritis such as CVA tenderness or fever.   CT without acute concern other than moderate constipation. Educated patient on increasing water and fiber intake and possibly using Miralax to help with symptoms. Patient stating that Miralax worked well for him in the past and he would like to try it again.  Recommended following up with PCP. Patient verbalized understanding of plan. I have reviewed the patients home medicines and have made adjustments as needed Patient was given return precautions. Patient stable for discharge at this time.  Patient verbalized understanding of plan.  Ddx: These are considered less likely due to history of present illness and physical exam. -gastroenteritis: No vomiting in ED; no fever; tolerating PO intake -colitis: Denies  diarrhea, patient afebrile  -small bowel obstruction: Last BM 2 days ago; CT without concern  -appendicitis: Negative McBurney point, rebound tenderness, psoas, obturator sign  -cholecystitis: Negative Murphy sign; liver enzymes within normal limits  -pancreatitis: No LUQ tenderness to palpation, lipase only mildly elevated -nephrolithiasis: CT without concern    Social Determinants of Health:  none          Final Clinical Impression(s) / ED Diagnoses Final diagnoses:  Chronic idiopathic constipation  Acute cystitis without hematuria  Gastroesophageal reflux disease, unspecified whether esophagitis present    Rx / DC Orders ED Discharge Orders          Ordered    polyethylene glycol powder (GLYCOLAX/MIRALAX) 17 GM/SCOOP powder  2 times daily,   Status:  Discontinued        08/13/23 1655    cephALEXin (KEFLEX) 500 MG capsule  2 times daily        08/13/23 1733    polyethylene glycol powder (GLYCOLAX/MIRALAX) 17 GM/SCOOP powder  2 times daily        08/13/23 1739              Dorthy Cooler, PA-C 08/13/23 1741    Franne Forts, DO 08/14/23 [phone]

## 2023-08-15 LAB — URINE CULTURE

## 2023-09-24 ENCOUNTER — Emergency Department (HOSPITAL_COMMUNITY)
Admission: EM | Admit: 2023-09-24 | Discharge: 2023-09-24 | Disposition: A | Discharge status: Home / Self Care | Payer: 59 | Attending: Emergency Medicine | Admitting: Emergency Medicine

## 2023-09-24 ENCOUNTER — Encounter (HOSPITAL_COMMUNITY): Payer: Self-pay | Admitting: Emergency Medicine

## 2023-09-24 ENCOUNTER — Other Ambulatory Visit: Payer: Self-pay

## 2023-09-24 DIAGNOSIS — G8929 Other chronic pain: Secondary | ICD-10-CM | POA: Insufficient documentation

## 2023-09-24 DIAGNOSIS — M5440 Lumbago with sciatica, unspecified side: Secondary | ICD-10-CM | POA: Insufficient documentation

## 2023-09-24 DIAGNOSIS — Z79899 Other long term (current) drug therapy: Secondary | ICD-10-CM | POA: Insufficient documentation

## 2023-09-24 MED ORDER — NICOTINE 14 MG/24HR TD PT24: 14.0000 mg | MEDICATED_PATCH | Freq: Every day | TRANSDERMAL | 0 refills | Status: DC

## 2023-09-24 MED ORDER — LIDOCAINE 5 % EX PTCH
1.0000 | MEDICATED_PATCH | Freq: Once | CUTANEOUS | Status: DC
  Administered 2023-09-24: 1 via TRANSDERMAL
  Filled 2023-09-24: qty 1

## 2023-09-24 MED ORDER — ACETAMINOPHEN 500 MG PO TABS
1000.0000 mg | ORAL_TABLET | Freq: Once | ORAL | Status: AC
  Administered 2023-09-24: 1000 mg via ORAL
  Filled 2023-09-24: qty 2

## 2023-09-24 MED ORDER — OXYCODONE HCL 5 MG PO TABS
10.0000 mg | ORAL_TABLET | Freq: Once | ORAL | Status: DC
  Filled 2023-09-24: qty 2

## 2023-09-24 MED ORDER — KETOROLAC TROMETHAMINE 60 MG/2ML IM SOLN
15.0000 mg | Freq: Once | INTRAMUSCULAR | Status: AC
  Administered 2023-09-24: 15 mg via INTRAMUSCULAR
  Filled 2023-09-24: qty 2

## 2023-09-24 MED ORDER — OXYCODONE HCL 10 MG PO TABS
10.0000 mg | ORAL_TABLET | Freq: Four times a day (QID) | ORAL | 0 refills | Status: DC | PRN
Start: 2023-09-24 — End: 2024-04-21

## 2023-09-24 NOTE — Discharge Instructions (Addendum)
Please follow-up with your Primary Care Physician within the next week. Please take your medications as instructed and discuss any changes to your medications with your primary care physician.    Please return to the Emergency Department if you have any leg numbness, leg weakness, difficulty walking, fevers, worsening of pain, lightheadedness, lose consciousness, severe abdominal pain, severe headache, difficulty urinating, or difficulty having a bowel movement.   Please return to the emergency department immediately for any new or concerning symptoms, or if you get worse.
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
## 2023-10-03 NOTE — Plan of Care (Signed)
[facility] Tonsillectomy/Adenoidectomy, Postoperative PEDS care plan entered in error.

## 2023-10-24 ENCOUNTER — Ambulatory Visit: Payer: 59 | Admitting: Student in an Organized Health Care Education/Training Program

## 2023-11-12 ENCOUNTER — Ambulatory Visit: Payer: 59 | Admitting: Physical Therapy
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
## 2023-11-15 ENCOUNTER — Ambulatory Visit: Payer: 59 | Attending: Physical Therapy | Admitting: Physical Therapy
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
## 2023-11-28 ENCOUNTER — Ambulatory Visit: Payer: 59 | Admitting: Physical Therapy

## 2024-04-01 ENCOUNTER — Other Ambulatory Visit: Payer: Self-pay | Admitting: Orthopedic Surgery

## 2024-04-01 DIAGNOSIS — M545 Low back pain, unspecified: Secondary | ICD-10-CM

## 2024-04-16 ENCOUNTER — Ambulatory Visit
Admission: RE | Admit: 2024-04-16 | Discharge: 2024-04-16 | Disposition: A | Discharge status: Home / Self Care | Source: Ambulatory Visit | Attending: Orthopedic Surgery | Admitting: Orthopedic Surgery

## 2024-04-16 DIAGNOSIS — M545 Low back pain, unspecified: Secondary | ICD-10-CM

## 2024-04-21 ENCOUNTER — Encounter (HOSPITAL_COMMUNITY): Payer: Self-pay | Admitting: Pharmacy Technician

## 2024-04-21 ENCOUNTER — Emergency Department (HOSPITAL_COMMUNITY)
Admission: EM | Admit: 2024-04-21 | Discharge: 2024-04-21 | Disposition: A | Discharge status: Home / Self Care | Attending: Emergency Medicine | Admitting: Emergency Medicine

## 2024-04-21 ENCOUNTER — Other Ambulatory Visit: Payer: Self-pay

## 2024-04-21 ENCOUNTER — Emergency Department (HOSPITAL_COMMUNITY)

## 2024-04-21 DIAGNOSIS — N400 Enlarged prostate without lower urinary tract symptoms: Secondary | ICD-10-CM | POA: Insufficient documentation

## 2024-04-21 DIAGNOSIS — Z79899 Other long term (current) drug therapy: Secondary | ICD-10-CM | POA: Diagnosis not present

## 2024-04-21 DIAGNOSIS — N3000 Acute cystitis without hematuria: Secondary | ICD-10-CM | POA: Insufficient documentation

## 2024-04-21 DIAGNOSIS — I10 Essential (primary) hypertension: Secondary | ICD-10-CM | POA: Diagnosis not present

## 2024-04-21 DIAGNOSIS — F17200 Nicotine dependence, unspecified, uncomplicated: Secondary | ICD-10-CM | POA: Insufficient documentation

## 2024-04-21 DIAGNOSIS — N320 Bladder-neck obstruction: Secondary | ICD-10-CM | POA: Insufficient documentation

## 2024-04-21 DIAGNOSIS — R309 Painful micturition, unspecified: Secondary | ICD-10-CM | POA: Diagnosis present

## 2024-04-21 LAB — URINALYSIS, ROUTINE W REFLEX MICROSCOPIC
Bilirubin Urine: NEGATIVE
Glucose, UA: 50 mg/dL — AB
Hgb urine dipstick: NEGATIVE
Ketones, ur: NEGATIVE mg/dL
Nitrite: NEGATIVE
Protein, ur: 30 mg/dL — AB
Specific Gravity, Urine: 1.013 (ref 1.005–1.030)
pH: 7 (ref 5.0–8.0)

## 2024-04-21 LAB — BASIC METABOLIC PANEL WITH GFR
Anion gap: 10 (ref 5–15)
BUN: 11 mg/dL (ref 8–23)
CO2: 28 mmol/L (ref 22–32)
Calcium: 9.1 mg/dL (ref 8.9–10.3)
Chloride: 93 mmol/L — ABNORMAL LOW (ref 98–111)
Creatinine, Ser: 1.16 mg/dL (ref 0.61–1.24)
GFR, Estimated: >60 mL/min (ref >60)
Glucose, Bld: 162 mg/dL — ABNORMAL HIGH (ref 70–99)
Potassium: 3.5 mmol/L (ref 3.5–5.1)
Sodium: 131 mmol/L — ABNORMAL LOW (ref 135–145)

## 2024-04-21 LAB — CBC
HCT: 36.9 % — ABNORMAL LOW (ref 39.0–52.0)
Hemoglobin: 12.3 g/dL — ABNORMAL LOW (ref 13.0–17.0)
MCH: 28 pg (ref 26.0–34.0)
MCHC: 33.3 g/dL (ref 30.0–36.0)
MCV: 84.1 fL (ref 80.0–100.0)
Platelets: 378 10*3/uL (ref 150–400)
RBC: 4.39 MIL/uL (ref 4.22–5.81)
RDW: 13.5 % (ref 11.5–15.5)
WBC: 10.8 10*3/uL — ABNORMAL HIGH (ref 4.0–10.5)
nRBC: 0 % (ref 0.0–0.2)

## 2024-04-21 MED ORDER — FENTANYL CITRATE PF 50 MCG/ML IJ SOSY
50.0000 ug | PREFILLED_SYRINGE | Freq: Once | INTRAMUSCULAR | Status: AC
  Administered 2024-04-21: 50 ug via INTRAVENOUS
  Filled 2024-04-21: qty 1

## 2024-04-21 MED ORDER — CEPHALEXIN 500 MG PO CAPS
500.0000 mg | ORAL_CAPSULE | Freq: Four times a day (QID) | ORAL | 0 refills | Status: DC
Start: 2024-04-21 — End: 2024-04-28

## 2024-04-21 MED ORDER — ONDANSETRON HCL 4 MG/2ML IJ SOLN
4.0000 mg | Freq: Once | INTRAMUSCULAR | Status: AC
  Administered 2024-04-21: 4 mg via INTRAVENOUS
  Filled 2024-04-21: qty 2

## 2024-04-21 MED ORDER — SODIUM CHLORIDE 0.9 % IV SOLN
1.0000 g | Freq: Once | INTRAVENOUS | Status: AC
  Administered 2024-04-21: 1 g via INTRAVENOUS
  Filled 2024-04-21: qty 10

## 2024-04-21 NOTE — ED Notes (Signed)
 Attempting urine sample att

## 2024-04-21 NOTE — ED Provider Notes (Signed)
 [place] EMERGENCY DEPARTMENT AT [facility] Provider Note   CSN: [id number] Arrival date & time: 04/21/24  0732     History  No chief complaint on file.   Brian Dominguez is a 78 y.o. male chronic back pain, history of back surgery, arthritis, tobacco abuse, hypertension, hyperlipidemia, chronic prostatitis who presents emergency department for difficulty urinating and burning with urination.  This has been going on for about 2 days.  Patient has a history of difficulty getting his stream started, frequent nocturia.  He reports that this morning he did make a large volume urine.  He denies saddle anesthesia or leg weakness or severe back pain out of character for his normal back pain.  He denies loss of control of his bowel.  He complains of pain in his suprapubic region.  Denies fever or chills. HPI     Home Medications Prior to Admission medications   Medication Sig Start Date End Date Taking? Authorizing Provider  losartan-hydrochlorothiazide  (HYZAAR) 100-25 MG tablet Take 1 tablet by mouth daily. 04/07/24   [provider]  oxyCODONE -acetaminophen  (PERCOCET) 10-325 MG tablet Take 1 tablet by mouth 4 (four) times daily as needed. 03/31/24   [provider]  sucralfate  (CARAFATE ) 1 g tablet Take 1 g by mouth 3 (three) times daily. 02/18/24   [provider]  Vitamin D , Ergocalciferol , (DRISDOL) 1.25 MG (50000 UNIT) CAPS capsule Take 50,000 Units by mouth once a week. 02/26/24   [provider]  atorvastatin  (LIPITOR) 20 MG tablet Take 1 tablet (20 mg total) by mouth daily. Patient taking differently: Take 20 mg by mouth at bedtime. 08/01/20   Jovita Nipper, MD  cetirizine  (ZYRTEC ) 10 MG tablet Take 1 tablet (10 mg total) by mouth daily. 09/10/18   Massie Soles, MD  clotrimazole  (LOTRIMIN ) 1 % cream Apply 1 Application topically 2 (two) times daily as needed. 07/08/23   [provider]  dapagliflozin propanediol (FARXIGA) 10 MG TABS  tablet Take 10 mg by mouth daily.    [provider]  diazepam  (VALIUM ) 5 MG tablet Take one tablet by mouth with food one hour prior to procedure. May repeat 30 minutes prior if needed. 06/27/23   Williams, Megan E, NP  diclofenac  Sodium (VOLTAREN ) 1 % GEL Apply 1 Application topically 4 (four) times daily as needed (pain).    [provider]  DULoxetine (CYMBALTA) 60 MG capsule Take 120 mg by mouth daily.    [provider]  fluticasone  (FLONASE ) 50 MCG/ACT nasal spray Place 2 sprays into both nostrils daily as needed for allergies. 10/29/22   [provider]  gabapentin  (NEURONTIN ) 300 MG capsule Take 300 mg by mouth 3 (three) times daily as needed (pain).    [provider]  losartan-hydrochlorothiazide  (HYZAAR) 50-12.5 MG tablet Take 1 tablet by mouth daily.    [provider]  metFORMIN  (GLUCOPHAGE ) 1000 MG tablet TAKE 1 TABLET (1,000 MG TOTAL) BY MOUTH 2 (TWO) TIMES DAILY WITH A MEAL. 05/29/21   Cresenzo, Wayden V, MD  naloxone [facility]) nasal spray 4 mg/0.1 mL Place 1 spray into the nose once.    [provider]  nicotine  (NICODERM CQ  - DOSED IN MG/24 HOURS) 14 mg/24hr patch Place 1 patch (14 mg total) onto the skin daily. 09/24/23   Teddi Favors, DO  omeprazole  (PRILOSEC) 40 MG capsule Take 40 mg by mouth 2 (two) times daily as needed (acid reflux).    [provider]  ondansetron  (ZOFRAN -ODT) 4 MG disintegrating tablet  Take 1 tablet (4 mg total) by mouth every 8 (eight) hours as needed for nausea or vomiting. 02/05/22   Sueellen Emery, MD  oxyCODONE  10 MG TABS Take 1 tablet (10 mg total) by mouth every 6 (six) hours as needed for up to 10 doses for severe pain (pain score 7-10). 09/24/23   Teddi Favors, DO  polyethylene glycol powder (GLYCOLAX /MIRALAX ) 17 GM/SCOOP powder Take 255 g by mouth 2 (two) times daily. Until daily soft stools  OTC 08/13/23   Dorisann Garre F, PA-C  tamsulosin  (FLOMAX ) 0.4 MG CAPS capsule Take  0.4 mg by mouth daily.    [provider]  tiZANidine  (ZANAFLEX ) 4 MG tablet Take 4 mg by mouth 3 (three) times daily as needed for muscle spasms. 10/31/22   [provider]      Allergies    Enalapril    Review of Systems   Review of Systems  Physical Exam Updated Vital Signs BP (!) 161/69 (BP Location: Right Arm)   Pulse (!) 101   Temp 99.6 F (37.6 C)   Resp 18   SpO2 100%  Physical Exam Vitals and nursing note reviewed.  Constitutional:      General: He is not in acute distress.    Appearance: He is well-developed. He is not diaphoretic.  HENT:     Head: Normocephalic and atraumatic.  Eyes:     General: No scleral icterus.    Conjunctiva/sclera: Conjunctivae normal.  Cardiovascular:     Rate and Rhythm: Normal rate and regular rhythm.     Heart sounds: Normal heart sounds.  Pulmonary:     Effort: Pulmonary effort is normal. No respiratory distress.     Breath sounds: Normal breath sounds.  Abdominal:     Palpations: Abdomen is soft.     Tenderness: There is abdominal tenderness in the suprapubic area.  Musculoskeletal:     Cervical back: Normal range of motion and neck supple.  Skin:    General: Skin is warm and dry.  Neurological:     Mental Status: He is alert.  Psychiatric:        Behavior: Behavior normal.     ED Results / Procedures / Treatments   Labs (all labs ordered are listed, but only abnormal results are displayed) Labs Reviewed  URINALYSIS, ROUTINE W REFLEX MICROSCOPIC    EKG None  Radiology No results found.  Procedures Procedures    Medications Ordered in ED Medications  ondansetron  (ZOFRAN ) injection 4 mg (has no administration in time range)  fentaNYL  (SUBLIMAZE ) injection 50 mcg (has no administration in time range)    ED Course/ Medical Decision Making/ A&P Clinical Course as of 04/21/24 1042  Tue Apr 21, 2024  0956 WBC(!): 10.8 [AH]  0956 Hemoglobin(!): 12.3 [AH]  0956 Urinalysis, Routine w reflex  microscopic -Urine, Clean Catch(!) Urine appears infected.  Rocephin  and urine culture ordered [AH]    Clinical Course User Index [AH] Tama Fails, PA-C                                 Medical Decision Making 78 year old male presents with dysuria.  Differential diagnosis includes kidney stone, ureteral colic, urinary tract infection, STI less likely, stricture, BPH.  I ordered labs including BMP CBC both of which show no significant abnormalities.  He has mildly elevated blood glucose in accordance with his history of diabetes and white blood cell count 12.8 only mildly elevated.  Urine does appear to be infected and was sent for culture.  Treated here in the emergency department with Rocephin , pain meds with improvement.  Patient does not have any evidence of pyelonephritis.   He is afebrile and he is hemodynamically stable.  I visualized and interpreted CT renal stone study which shows significantly hypertrophied prostate which is likely the underlying cause of his urinary tract infection and signs of chronic bladder outlet obstruction due to this hypertrophy.  He states he has seen [facility] in the past and will follow-up. Patient was asking for pain medication at discharge however review of PDMP shows that he got 30 days worth of Percocet tens less than 30 days ago.  No pain meds given at discharge. PDMP not reviewed this encounter.  Patient we discharged with a weeks worth of Keflex .  Discussed outpatient follow-up and return precautions appropriate for discharge at this time  Amount and/or Complexity of Data Reviewed Labs: ordered. Decision-making details documented in ED Course. Radiology: ordered.  Risk Prescription drug management.           Final Clinical Impression(s) / ED Diagnoses Final diagnoses:  Acute cystitis without hematuria  Prostatic hypertrophy  Bladder outlet obstruction    Rx / DC Orders ED Discharge Orders     None          Tama Fails, PA-C 04/21/24 1045    Burnette Carte, MD 04/22/24 [phone]

## 2024-04-21 NOTE — Discharge Instructions (Addendum)
 It appears that you already got 30 days worth of Percocet tens less than 30 days ago as listed below 03/31/2024 03/31/2024  2 Oxycodone -Acetaminophen  10-325 120.00 30 Yu Sun [id number] [facility] [phone]) 0/0 60.00 MME Medicare [place]  If you are in need of more of this pain medication or you have overused the medicine you will need to talk to the person that prescribes them regularly and I will be unable to provide any narcotic pain medication. You are seen in the emergency department today for your complaint of burning with urination.  On CT imaging it shows a very enlarged prostate which is likely contributing to the fact that you have a urinary tract infection today.  You have received IV antibiotics and will need to complete a course of oral antibiotics for treatment.  You will also need to follow closely with [facility] to manage your enlarged prostate.  Contact a health care provider if: Your symptoms don't get better after 1-2 days of taking antibiotics. Your symptoms go away and then come back. You have a fever or chills. You vomit or feel like you may vomit. Get help right away if: You have very bad pain out that is different from your chronic  back pain or new pain in your lower belly. You are unable to urinate You faint.

## 2024-04-21 NOTE — ED Notes (Signed)
 Bladder scanned patient, noted 135 in bladder

## 2024-04-21 NOTE — ED Triage Notes (Signed)
 Pt here with reports of burning with urination X2 days.

## 2024-04-23 LAB — URINE CULTURE: Culture: >=100000 — AB

## 2024-04-24 ENCOUNTER — Telehealth (HOSPITAL_BASED_OUTPATIENT_CLINIC_OR_DEPARTMENT_OTHER): Payer: Self-pay | Admitting: Emergency Medicine

## 2024-04-24 NOTE — Telephone Encounter (Signed)
 Post ED Visit - Positive Culture Follow-up: Successful Patient Follow-Up  Culture assessed and recommendations reviewed by:  []  Court Distance, Pharm.D. []  Skeet Duke, Pharm.D., BCPS AQ-ID []  Leslee Rase, Pharm.D., BCPS []  Garland Junk, Pharm.D., BCPS []  [place], [address].D., BCPS, AAHIVP []  Alcide Aly, Pharm.D., BCPS, AAHIVP []  Jerri Morale, PharmD, BCPS []  Graham Laws, PharmD, BCPS []  Cleda Curly, PharmD, BCPS [x]  Adaline Ada, PharmD  Positive urine culture  []  Patient discharged without antimicrobial prescription and treatment is now indicated [x]  Organism is resistant to prescribed ED discharge antimicrobial []  Patient with positive blood cultures  Changes discussed with ED provider: Johana Soto PA New antibiotic prescription Ciprofloxacin  500 mg BID for seven days Called to [facility] [phone]  Contacted patient, date 04/24/2024, time 1530    Kalin Kyler C Ladashia Demarinis 04/24/2024, 4:07 PM

## 2024-04-24 NOTE — Telephone Encounter (Signed)
 Post ED Visit - Positive Culture Follow-up: Unsuccessful Patient Follow-up  Culture assessed and recommendations reviewed by:  []  Court Distance, Pharm.D. []  Skeet Duke, Pharm.D., BCPS AQ-ID []  Leslee Rase, Pharm.D., BCPS []  Garland Junk, Pharm.D., BCPS []  [place], [place].D., BCPS, AAHIVP []  Alcide Aly, Pharm.D., BCPS, AAHIVP [x]  Adaline Ada, PharmD []  Thomasine Flick, PharmD, BCPS  Positive urine culture  []  Patient discharged without antimicrobial prescription and treatment is now indicated [x]  Organism is resistant to prescribed ED discharge antimicrobial []  Patient with positive blood cultures   Unable to contact patient by phone. Left voicemail to return call, letter will be sent to address on file  Plan: Stop Kelfex Begin Ciprofloxacin  500 mg BID for seven days - Johana Soto PA  Lillie Reining 04/24/2024, 3:28 PM

## 2024-04-24 NOTE — Progress Notes (Signed)
 ED Antimicrobial Stewardship Positive Culture Follow Up   [name] is an 78 y.o. male who presented to [facility] on 04/21/2024 with a chief complaint of No chief complaint on file.   Recent Results (from the past 720 hours)  Urine Culture     Status: Abnormal   Collection Time: 04/21/24  9:37 AM   Specimen: Urine, Clean Catch  Result Value Ref Range Status   Specimen Description URINE, CLEAN CATCH  Final   Special Requests   Final    NONE Performed at [facility], [address], [place], [place] [address]    Culture >=100,000 COLONIES/mL [facility] MORGANII (A)  Final   Report Status 04/23/2024 FINAL  Final   Organism ID, Bacteria MORGANELLA MORGANII (A)  Final      Susceptibility   Morganella morganii - MIC*    AMPICILLIN >=32 RESISTANT Resistant     CIPROFLOXACIN  <=0.25 SENSITIVE Sensitive     GENTAMICIN <=1 SENSITIVE Sensitive     IMIPENEM 4 SENSITIVE Sensitive     NITROFURANTOIN 128 RESISTANT Resistant     TRIMETH/SULFA <=20 SENSITIVE Sensitive     AMPICILLIN/SULBACTAM 8 SENSITIVE Sensitive     PIP/TAZO <=4 SENSITIVE Sensitive ug/mL    * >=100,000 COLONIES/mL MORGANELLA MORGANII    [x]  Treated with Keflex , organism resistant to prescribed antimicrobial []  Patient discharged originally without antimicrobial agent and treatment is now indicated  New antibiotic prescription: ciprofloxacin  500 mg BID for 7 days  ED Provider: Luellen Sages, PA-C   [facility] 04/24/2024, 9:46 AM Clinical Pharmacist Monday - Friday phone -  [phone] Saturday - Sunday phone - [phone]

## 2024-04-26 ENCOUNTER — Inpatient Hospital Stay (HOSPITAL_COMMUNITY)
Admission: EM | Admit: 2024-04-26 | Discharge: 2024-04-28 | DRG: 683 | Disposition: A | Discharge status: Home / Self Care | Attending: Family Medicine | Admitting: Family Medicine

## 2024-04-26 ENCOUNTER — Other Ambulatory Visit: Payer: Self-pay

## 2024-04-26 ENCOUNTER — Encounter (HOSPITAL_COMMUNITY): Payer: Self-pay

## 2024-04-26 ENCOUNTER — Emergency Department (HOSPITAL_COMMUNITY)

## 2024-04-26 DIAGNOSIS — Z79899 Other long term (current) drug therapy: Secondary | ICD-10-CM

## 2024-04-26 DIAGNOSIS — M545 Low back pain, unspecified: Secondary | ICD-10-CM | POA: Insufficient documentation

## 2024-04-26 DIAGNOSIS — Z96651 Presence of right artificial knee joint: Secondary | ICD-10-CM | POA: Diagnosis present

## 2024-04-26 DIAGNOSIS — R55 Syncope and collapse: Secondary | ICD-10-CM | POA: Insufficient documentation

## 2024-04-26 DIAGNOSIS — D649 Anemia, unspecified: Secondary | ICD-10-CM | POA: Diagnosis present

## 2024-04-26 DIAGNOSIS — I6501 Occlusion and stenosis of right vertebral artery: Secondary | ICD-10-CM | POA: Insufficient documentation

## 2024-04-26 DIAGNOSIS — F329 Major depressive disorder, single episode, unspecified: Secondary | ICD-10-CM | POA: Diagnosis present

## 2024-04-26 DIAGNOSIS — Z888 Allergy status to other drugs, medicaments and biological substances status: Secondary | ICD-10-CM

## 2024-04-26 DIAGNOSIS — Z789 Other specified health status: Secondary | ICD-10-CM

## 2024-04-26 DIAGNOSIS — N179 Acute kidney failure, unspecified: Principal | ICD-10-CM | POA: Diagnosis present

## 2024-04-26 DIAGNOSIS — Z87891 Personal history of nicotine dependence: Secondary | ICD-10-CM

## 2024-04-26 DIAGNOSIS — Z96612 Presence of left artificial shoulder joint: Secondary | ICD-10-CM | POA: Diagnosis present

## 2024-04-26 DIAGNOSIS — R42 Dizziness and giddiness: Secondary | ICD-10-CM

## 2024-04-26 DIAGNOSIS — T3695XA Adverse effect of unspecified systemic antibiotic, initial encounter: Secondary | ICD-10-CM | POA: Diagnosis present

## 2024-04-26 DIAGNOSIS — R2681 Unsteadiness on feet: Secondary | ICD-10-CM | POA: Diagnosis present

## 2024-04-26 DIAGNOSIS — E119 Type 2 diabetes mellitus without complications: Secondary | ICD-10-CM

## 2024-04-26 DIAGNOSIS — J8410 Pulmonary fibrosis, unspecified: Secondary | ICD-10-CM | POA: Diagnosis present

## 2024-04-26 DIAGNOSIS — R918 Other nonspecific abnormal finding of lung field: Secondary | ICD-10-CM | POA: Diagnosis present

## 2024-04-26 DIAGNOSIS — G8929 Other chronic pain: Secondary | ICD-10-CM | POA: Diagnosis present

## 2024-04-26 DIAGNOSIS — K521 Toxic gastroenteritis and colitis: Secondary | ICD-10-CM | POA: Diagnosis present

## 2024-04-26 DIAGNOSIS — W19XXXA Unspecified fall, initial encounter: Secondary | ICD-10-CM | POA: Diagnosis present

## 2024-04-26 DIAGNOSIS — Z602 Problems related to living alone: Secondary | ICD-10-CM | POA: Diagnosis present

## 2024-04-26 DIAGNOSIS — N401 Enlarged prostate with lower urinary tract symptoms: Secondary | ICD-10-CM | POA: Diagnosis present

## 2024-04-26 DIAGNOSIS — R309 Painful micturition, unspecified: Secondary | ICD-10-CM | POA: Diagnosis present

## 2024-04-26 DIAGNOSIS — E860 Dehydration: Secondary | ICD-10-CM | POA: Diagnosis present

## 2024-04-26 DIAGNOSIS — I10 Essential (primary) hypertension: Secondary | ICD-10-CM | POA: Diagnosis present

## 2024-04-26 DIAGNOSIS — Z7984 Long term (current) use of oral hypoglycemic drugs: Secondary | ICD-10-CM

## 2024-04-26 DIAGNOSIS — M5412 Radiculopathy, cervical region: Secondary | ICD-10-CM | POA: Diagnosis present

## 2024-04-26 DIAGNOSIS — Z87440 Personal history of urinary (tract) infections: Secondary | ICD-10-CM

## 2024-04-26 DIAGNOSIS — Z981 Arthrodesis status: Secondary | ICD-10-CM

## 2024-04-26 DIAGNOSIS — D75839 Thrombocytosis, unspecified: Secondary | ICD-10-CM | POA: Diagnosis present

## 2024-04-26 DIAGNOSIS — N390 Urinary tract infection, site not specified: Secondary | ICD-10-CM | POA: Insufficient documentation

## 2024-04-26 LAB — COMPREHENSIVE METABOLIC PANEL WITH GFR
ALT: 11 U/L (ref 0–44)
AST: 18 U/L (ref 15–41)
Albumin: 3.4 g/dL — ABNORMAL LOW (ref 3.5–5.0)
Alkaline Phosphatase: 53 U/L (ref 38–126)
Anion gap: 11 (ref 5–15)
BUN: 15 mg/dL (ref 8–23)
CO2: 27 mmol/L (ref 22–32)
Calcium: 9.5 mg/dL (ref 8.9–10.3)
Chloride: 96 mmol/L — ABNORMAL LOW (ref 98–111)
Creatinine, Ser: 1.77 mg/dL — ABNORMAL HIGH (ref 0.61–1.24)
GFR, Estimated: 39 mL/min — ABNORMAL LOW (ref >60)
Glucose, Bld: 125 mg/dL — ABNORMAL HIGH (ref 70–99)
Potassium: 3.7 mmol/L (ref 3.5–5.1)
Sodium: 134 mmol/L — ABNORMAL LOW (ref 135–145)
Total Bilirubin: 0.6 mg/dL (ref 0.0–1.2)
Total Protein: 7.9 g/dL (ref 6.5–8.1)

## 2024-04-26 LAB — CBC WITH DIFFERENTIAL/PLATELET
Abs Immature Granulocytes: 0.16 10*3/uL — ABNORMAL HIGH (ref 0.00–0.07)
Basophils Absolute: 0.1 10*3/uL (ref 0.0–0.1)
Basophils Relative: 1 %
Eosinophils Absolute: 0.2 10*3/uL (ref 0.0–0.5)
Eosinophils Relative: 2 %
HCT: 37.2 % — ABNORMAL LOW (ref 39.0–52.0)
Hemoglobin: 12.4 g/dL — ABNORMAL LOW (ref 13.0–17.0)
Immature Granulocytes: 2 %
Lymphocytes Relative: 18 %
Lymphs Abs: 1.9 10*3/uL (ref 0.7–4.0)
MCH: 27.9 pg (ref 26.0–34.0)
MCHC: 33.3 g/dL (ref 30.0–36.0)
MCV: 83.8 fL (ref 80.0–100.0)
Monocytes Absolute: 0.7 10*3/uL (ref 0.1–1.0)
Monocytes Relative: 7 %
Neutro Abs: 7.5 10*3/uL (ref 1.7–7.7)
Neutrophils Relative %: 70 %
Platelets: 610 10*3/uL — ABNORMAL HIGH (ref 150–400)
RBC: 4.44 MIL/uL (ref 4.22–5.81)
RDW: 13.7 % (ref 11.5–15.5)
WBC: 10.5 10*3/uL (ref 4.0–10.5)
nRBC: 0 % (ref 0.0–0.2)

## 2024-04-26 LAB — D-DIMER, QUANTITATIVE: D-Dimer, Quant: 0.44 ug{FEU}/mL (ref 0.00–0.50)

## 2024-04-26 LAB — TROPONIN I (HIGH SENSITIVITY): Troponin I (High Sensitivity): 12 ng/L (ref <18)

## 2024-04-26 LAB — BRAIN NATRIURETIC PEPTIDE: B Natriuretic Peptide: 16.5 pg/mL (ref 0.0–100.0)

## 2024-04-26 LAB — CBG MONITORING, ED: Glucose-Capillary: 143 mg/dL — ABNORMAL HIGH (ref 70–99)

## 2024-04-26 MED ORDER — ACETAMINOPHEN 325 MG PO TABS
650.0000 mg | ORAL_TABLET | Freq: Four times a day (QID) | ORAL | Status: DC | PRN
  Administered 2024-04-27 – 2024-04-28 (×3): 650 mg via ORAL
  Filled 2024-04-26 (×4): qty 2

## 2024-04-26 MED ORDER — SODIUM CHLORIDE 0.9 % IV BOLUS
1000.0000 mL | Freq: Once | INTRAVENOUS | Status: AC
  Administered 2024-04-26: 1000 mL via INTRAVENOUS

## 2024-04-26 MED ORDER — ACETAMINOPHEN 650 MG RE SUPP: 650.0000 mg | Freq: Four times a day (QID) | RECTAL | Status: DC | PRN

## 2024-04-26 MED ORDER — IOPAMIDOL (ISOVUE-370) INJECTION 76%
75.0000 mL | Freq: Once | INTRAVENOUS | Status: AC | PRN
  Administered 2024-04-26: 75 mL via INTRAVENOUS

## 2024-04-26 MED ORDER — CYCLOBENZAPRINE HCL 10 MG PO TABS
5.0000 mg | ORAL_TABLET | Freq: Once | ORAL | Status: AC
  Administered 2024-04-26: 5 mg via ORAL
  Filled 2024-04-26: qty 1

## 2024-04-26 MED ORDER — OXYCODONE-ACETAMINOPHEN 10-325 MG PO TABS: 1.0000 | ORAL_TABLET | Freq: Four times a day (QID) | ORAL | Status: DC | PRN

## 2024-04-26 MED ORDER — LACTATED RINGERS IV BOLUS
500.0000 mL | Freq: Once | INTRAVENOUS | Status: AC
  Administered 2024-04-26: 500 mL via INTRAVENOUS

## 2024-04-26 MED ORDER — TIZANIDINE HCL 4 MG PO TABS
4.0000 mg | ORAL_TABLET | Freq: Three times a day (TID) | ORAL | Status: DC | PRN
  Administered 2024-04-27 – 2024-04-28 (×2): 4 mg via ORAL
  Filled 2024-04-26 (×2): qty 1

## 2024-04-26 MED ORDER — OXYCODONE-ACETAMINOPHEN 5-325 MG PO TABS
1.0000 | ORAL_TABLET | Freq: Once | ORAL | Status: AC
  Administered 2024-04-26: 1 via ORAL
  Filled 2024-04-26: qty 1

## 2024-04-26 MED ORDER — LORAZEPAM 1 MG PO TABS: 1.0000 mg | ORAL_TABLET | ORAL | Status: DC | PRN

## 2024-04-26 MED ORDER — ENOXAPARIN SODIUM 40 MG/0.4ML IJ SOSY
40.0000 mg | PREFILLED_SYRINGE | INTRAMUSCULAR | Status: DC
  Administered 2024-04-26 – 2024-04-27 (×2): 40 mg via SUBCUTANEOUS
  Filled 2024-04-26 (×2): qty 0.4

## 2024-04-26 MED ORDER — LIDOCAINE 5 % EX PTCH
1.0000 | MEDICATED_PATCH | CUTANEOUS | Status: DC
  Administered 2024-04-26 – 2024-04-28 (×3): 1 via TRANSDERMAL
  Filled 2024-04-26 (×3): qty 1

## 2024-04-26 MED ORDER — DULOXETINE HCL 30 MG PO CPEP
60.0000 mg | ORAL_CAPSULE | Freq: Two times a day (BID) | ORAL | Status: DC
  Administered 2024-04-26: 60 mg via ORAL
  Filled 2024-04-26: qty 2

## 2024-04-26 MED ORDER — GABAPENTIN 300 MG PO CAPS: 300.0000 mg | ORAL_CAPSULE | Freq: Three times a day (TID) | ORAL | Status: DC | PRN

## 2024-04-26 MED ORDER — OXYCODONE HCL 5 MG PO TABS
10.0000 mg | ORAL_TABLET | Freq: Four times a day (QID) | ORAL | Status: DC | PRN
  Administered 2024-04-26 – 2024-04-28 (×5): 10 mg via ORAL
  Filled 2024-04-26 (×5): qty 2

## 2024-04-26 MED ORDER — INSULIN ASPART 100 UNIT/ML IJ SOLN
0.0000 [IU] | Freq: Three times a day (TID) | INTRAMUSCULAR | Status: DC
  Administered 2024-04-27 (×2): 1 [IU] via SUBCUTANEOUS
  Administered 2024-04-28: 2 [IU] via SUBCUTANEOUS

## 2024-04-26 MED ORDER — ACETAMINOPHEN 325 MG PO TABS
325.0000 mg | ORAL_TABLET | Freq: Four times a day (QID) | ORAL | Status: DC | PRN
  Administered 2024-04-26: 325 mg via ORAL

## 2024-04-26 NOTE — Plan of Care (Signed)
 Phone note  Called by Dr.Lawsing in the ER. Patient with near syncopal episode, has AKI.  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  There is a lot of imaging artifact that precludes a good evaluation of the posterior circulation. I have recommended that an MRI of the brain would be helpful in knowing whether this is a new lesion and has caused a stroke.  Recommendations: If the MRI reveals a stroke, please reengage neurology for formal consultation. If the MRI of the brain is negative-no inpatient neurological workup is required.  Syncope workup and AKI management per primary team.  -- Tona Francis, MD Neurologist [facility]

## 2024-04-26 NOTE — Plan of Care (Addendum)
 FMTS Brief Progress Note  S: Arrived at patient's bedside for night rounds.  Patient laying in bed comfortably said he did today for dizziness earlier this morning.  No other episodes since presentation to the ED. He report having 2-3 loose stools in the last week and has been having nausea since starting Percocets for his back pain. Reports back pain to be chronic and radiating into his right lower extremity.  Recently saw his PCP who started him on Percocets and he had a follow-up appointment show if he was supposed to be doing physical therapy.  Expresses concern about his kidney which was discussed with the ED doctor.  Patient reports he has not been drinking much in the last week and no void since earlier this morning.   O: BP (!) 147/69   Pulse 80   Temp 97.9 F (36.6 C) (Oral)   Resp 17   Ht 6\' 2"  (1.88 m)   Wt 93 kg   SpO2 99%   BMI 26.32 kg/m   General: Alert, laying comfortably in bed, NAD HEENT: Atraumatic, Dry mucous membrane  CV: RRR, no murmurs, normal S1/S2 Pulm: CTAB, good WOB on RA Abd: Soft, no distension, no tenderness Ext: No BLE edema Neuro: ANO x 3, no focal deficits   A/P:  AKI Suspect possible prerenal.  Creatinine on admission elevated at 1.77 baseline around 1.10.  Given reports of no voiding in over 12 hours and dry mucous membrane on exam we will give bolus IVF and trend creatinine with daily BMP. - Ordered 500ml LR bolus -Encourage adequate p.o. intake - Orders reviewed. Labs for AM ordered, which was adjusted as needed.   Right vertebral artery occlusion Head CTA head and neck showed an age indeterminant right vertebral artery occlusion.  Discussed with neurology who recommend MRI brain.  Illogically intact on exam.  Will follow-up with MRI brain. - Follow-up MRI brain  Lower back pain Chronic with concerns of right LE sciatica. - Continue home gabapentin  and Percocet - PT/OT eval and treat   Goble Last, MD 04/26/2024, 10:12 PM PGY-3, [facility] Night Resident  Please page [phone] with questions.

## 2024-04-26 NOTE — ED Triage Notes (Signed)
 Pt came in via POV d/t dizzy spells & his neck gets stiff & back pain with tingling that he feels in both of his arms. A/Ox4, rates his pain 10/10 during triage.

## 2024-04-26 NOTE — Assessment & Plan Note (Signed)
 Follows outpt w/ Ortho. Seems to be at baseline. - Cont home percocet 10-325 QID - Cont home gabapentin  - Cont lidoncaine patch - Cont home tizanidine 
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
## 2024-04-26 NOTE — Assessment & Plan Note (Signed)
 MDD: Cont home cymbalta HTN: Holding Hyzaar as above.  - restart other home meds as needed

## 2024-04-26 NOTE — Assessment & Plan Note (Signed)
 Suspect prerenal 2/2 poor PO and loose stools (potentially from recent antibx course). Less likely related to UTI given lack of other sick symptoms and improvement of prior UTI symptoms. Less likely post-renal, no signs of obstruction. Now euvolemic on exam.  - Mid to [facility] MedSurg with attending Dr.Eniola - s/p 1L NS bolus - Encourage PO - Holding home Hyzaar (resume as AKI improves) - Tylenol  prn for pain - Consider UA if dysuria/frequency returns - PT/OT - AM BMP, CBC

## 2024-04-26 NOTE — ED Notes (Signed)
Pt provided "ED Happy meal"  

## 2024-04-26 NOTE — Hospital Course (Addendum)
 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 His hospital course is detailed below:  AKI/UTI Creatinine baseline around 1, on admission 1.77. Suspect prerenal 2/2 poor PO and loose stools (potentially from recent antibx course).  Recent UTI but low suspicion for UTI or postrenal etiology currently.  Received 1 L bolus and was euvolemic. Creatinine worsened overnight, patient underwent echocardiogram to assess for appropriate cardiac function before receiving further IV hydration.  Patient also continued to have burning with urination, repeat UA collected which did not show further infection.  Bladder scan showed no retention. PO hydration was encouraged throughout patient's stay.  Creatinine improved to near baseline prior to discharge. Patient with continued burning, so GC/Chlamydia urine test was collected prior to discharge, will update patient when results are available. Symptoms may be related to BPH, see outpatient recommendations below.  Syncope Patient reported passing out for an unknown amount of time on admission through the emergency department.  They completed a CT and CTA which showed occlusion of right vertebral artery as described below but no other acute intra-cranial processes.  Lipid panel was collected and patient's LDL was 77, given his history of T2DM he was started on moderate intensity statin therapy.  Echocardiogram showed LVEF 60-65% and normal function.  TSH was 2.4.  Orthostatic vitals on admission were negative. He remained neurologically appropriate through out his hospitalization, recommend follow up with pcp outpatient.  Occlusion of right vertebral artery Incidental finding on syncopal workup.  "Functionally occluded" and  "age-indeterminate" per read.  Neuro consulted and recommended MRI which showed chronic infarcts but no acute. Patient appears to have adequately compensated with collateral flow. Started patient on moderate intensity statin therapy and ASA. Recommend follow up outpatient.  Pulmonary Nodules seen on CTA neck 11 mm right upper lobe pulmonary nodule. Consider one of the following for both low-risk and high-risk individuals: (a) repeat chest CT in 3 months, (B) follow-up PET-CT at this time or (c) tissue sampling.   Fibrocystic changes on CXR New interstitial opacities throughout the left mid and lower lung and throughout the entire right lung when compared to 2022. This likely corresponds to incidental fibrotic changes seen on recent CTA neck. Follow-up nonemergent chest CT should be considered for better evaluation.   Other chronic conditions were medically managed with home medications and formulary alternatives as necessary (T2DM, MDD, HTN, chronic back pain)  PCP Follow-up Recommendations: Consider interstitial cystitis workup  Chest CT in 3 months 07/2024 for 11 mm right upper lobe pulmonary nodule and for left mid and lower lung fibrocystic changes on CXR Consider workup and treatment for prostate enlargement Recommend urine trichomonas outpatient if symptoms persist.

## 2024-04-26 NOTE — Assessment & Plan Note (Signed)
 Neurology curbsided by ED (Dr Bonnita Buttner plan of care note 5/18). 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 If evidence of new stroke, the recommend reconsulting Neurology. If no acute stroke, they do not recommend further treatment.  - f/u MR brain

## 2024-04-26 NOTE — Assessment & Plan Note (Signed)
 SSI  Holding home metformin

## 2024-04-26 NOTE — H&P (Signed)
 Hospital Admission History and Physical Service Pager: [phone]  Patient name: Brian Dominguez Medical record number: [id number] Date of Birth: 06/01/1946 Age: 78 y.o. Gender: male  Primary Care Provider: [facility], [facility] Medical Consultants: none Code Status: Full   Preferred Emergency Contact:  Beulah Brunt Information     Name Relation Home Work Mobile   patterson,mary  [phone]        Other Contacts     Name Relation Home Work Mobile   clyburn,richard Nephew   [phone]        Chief Complaint: AKI  Assessment and Plan: Brian Dominguez is a 78 y.o. male presenting with AKI. Assessment & Plan AKI (acute kidney injury) (HCC) Suspect prerenal 2/2 poor PO and loose stools (potentially from recent antibx course). Less likely related to UTI given lack of other sick symptoms and improvement of prior UTI symptoms. Less likely post-renal, no signs of obstruction. Now euvolemic on exam.  - Mid to FMTS MedSurg with attending Dr.Eniola - s/p 1L NS bolus - Encourage PO - Holding home Hyzaar (resume as AKI improves) - Tylenol  prn for pain - Consider UA if dysuria/frequency returns - PT/OT - AM BMP, CBC Occlusion of right vertebral artery Neurology curbsided by ED (Dr Bonnita Buttner plan of care note 5/18). 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 If evidence of new stroke, the recommend reconsulting Neurology. If no acute stroke, they do not recommend further treatment.  - f/u MR brain Chronic low back pain Follows outpt w/ Ortho. Seems to be at baseline. - Cont home percocet 10-325 QID - Cont home gabapentin  - Cont lidoncaine patch - Cont home tizanidine  T2DM (type 2 diabetes mellitus) (HCC) - SSI - Holding home metformin  Chronic health problem MDD: Cont home cymbalta HTN: Holding Hyzaar as above.  - restart other home meds as needed   FEN/GI: Regular VTE Prophylaxis: Lovenox  Disposition: Med Surg  History of  Present Illness:  Brian Dominguez is a 78 y.o. male presenting with 2 days of lightheadedness.   Was recently seen in ED 5/13 for UTI, discharged w/ keflex . Since taking antibx, pt reports improvement of urinary frequency and dysuria.  Then patient reports having loose stools a few days ago, no blood. Also feeling nauseous (no vomiting). He reports that he has been having very poor appetite, eating only about 1 meal a day. He has been taking his meds as usual. He has also had dizziness for past few days, associated with moving from sitting to standing position.  This morning, he was standing up and felt close to blacking out, but did not actually lose consciousness.  Denies fevers.This prompted him to come to ED.   Also reports neck feels stiff on L side for past 2 weeks. Lower back has been painful too (this is chronic). Pt take oxycodone  10 mg 4 times a day for this pain, and is requesting pain meds in ED.  ED Course: Positive orthostatic signs. Trop and BNP unremarkable. CBC stable. CMP notable for AKI. Head imaging notable for R vertebral artery occlusion, Neuro was consulted. Pt got percocet, flexeril , and 1L NS bolus.   Pertinent Past Medical History: T2DM HTN MDD BPH OA Chronic prostatitis  Lumbar radiculopathy   Remainder reviewed in history tab.   Pertinent Past Surgical History: Lumbar fusion Cervical fusion Laminectomy  Cholecystectomy Hiatal hernia repair L shoulder arthroplasty R knee arthroplasty    Remainder reviewed in history tab.  Pertinent Social  History: Tobacco use: Quit a month ago Alcohol use: no Other Substance use: none Lives alone.  Pertinent Family History: Father, Mom, and brother had Heart attack   Remainder reviewed in history tab.   Important Outpatient Medications: Gabapentin  Duloxetine Hyzaar Metformin  Omeprazole  Percocet tizanidine   Remainder reviewed in medication history.   Objective: BP (!) 142/84   Pulse 82   Temp 98.1 F  (36.7 C) (Oral)   Resp 16   Ht 6\' 2"  (1.88 m)   Wt 93 kg   SpO2 100%   BMI 26.32 kg/m  Exam: General: Alert, pleasant man. NAD. Neuro: 5/5 symmetric strength in BL LE and Ue's. Sensation intact BL.  CN2: no vision changes CN3,4,6: PERRLA. EOMI CN5: Sensation intact BL CN7: Facial expressions symmetric CN8: Hearing intact BL CN9: palate symmetric  CN10: regular speech CN11: turns head against resistance CN12: tongue midline   HEENT: NCAT. MMM. CV: RRR, no murmurs. Cap refill <2. Resp: CTAB, no wheezing or crackles. Normal WOB on RA.  Abm: Soft, nontender, nondistended. BS present. Msk: No tenderness of neck or cervical/thoracic spine. Neck supple. Lumbar back diffusely tender.      Labs:  CBC BMET  Recent Labs  Lab 04/26/24 1240  WBC 10.5  HGB 12.4*  HCT 37.2*  PLT 610*   Recent Labs  Lab 04/26/24 1240  NA 134*  K 3.7  CL 96*  CO2 27  BUN 15  CREATININE 1.77*  GLUCOSE 125*  CALCIUM  9.5        Imaging Studies Performed:   CT head: 1.  No acute intracranial finding. 2. 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 3. Chronic appearing infarct within the right cerebellar hemisphere. 4. Cerebral atrophy. 5. Mild paranasal sinus mucosal thickening. 6. Left frontoparietal scalp lipoma.  CTA neck 1. The right vertebral artery appears functionally occluded within the neck with only faint intermittent enhancement present within this vessel. No prior vascular imaging of the neck is available and this is age-indeterminate. 2. Streak/beam hardening artifact partially obscures the left vertebral artery V1 and proximal V2 segments. Within this limitation, the left vertebral artery is patent within the neck without hemodynamically significant stenosis. 3. Streak/beam hardening artifact partially obscures the proximal right common carotid artery. Within this limitation, the common carotid and internal carotid arteries  are patent within the neck without hemodynamically significant stenosis (50% or greater). Atherosclerotic plaque bilaterally, as described. 4. Aortic Atherosclerosis (ICD10-I70.0). 5. Changes of fibrotic lung disease at the imaged levels, which would be better characterized with a dedicated chest CT. 6. 11 mm right upper lobe pulmonary nodule. Consider one of the following for both low-risk and high-risk individuals: (a) repeat chest CT in 3 months, (B) follow-up PET-CT at this time or (c) tissue sampling. This recommendation follows the consensus statement: Guidelines for Management of Incidental Pulmonary Nodules Detected on CT Images: From the Fleischner Society 2017; Radiology 2017; 284:228-243.  CTA Head 1. Some reconstitution of enhancement is demonstrated within a diminutive intracranial right vertebral artery, likely due to retrograde flow. Enhancement is also present within the proximal right PICA. 2. Atherosclerotic plaque within the intracranial internal carotid arteries with sites of mild stenosis.   Albin Huh, MD 04/26/2024, 5:47 PM PGY-2, [facility]  FPTS Intern pager: [phone], text pages welcome Secure chat group [facility] Teaching Service

## 2024-04-26 NOTE — ED Provider Triage Note (Signed)
 Emergency Medicine Provider Triage Evaluation Note  Brian Dominguez , a 78 y.o. male  was evaluated in triage.  Pt complains of dizziness today.  Pt reports he has chronic back pain as well.  Pt asking for something for pain   Review of Systems  Positive: Dizziness is new Negative: Fever or chills  Physical Exam  BP 101/64   Pulse 98   Temp 98.4 F (36.9 C)   Resp 16   Ht 6\' 2"  (1.88 m)   Wt 93 kg   SpO2 99%   BMI 26.32 kg/m  Gen:   Awake, no distress   Resp:  Normal effort  MSK:   Moves extremities without difficulty  Other:    Medical Decision Making  Medically screening exam initiated at 12:36 PM.  Appropriate orders placed.  Cathleen Coach was informed that the remainder of the evaluation will be completed by another provider, this initial triage assessment does not replace that evaluation, and the importance of remaining in the ED until their evaluation is complete.     Sandi Crosby, PA-C 04/26/24 1237

## 2024-04-27 ENCOUNTER — Observation Stay (HOSPITAL_COMMUNITY)

## 2024-04-27 ENCOUNTER — Observation Stay (HOSPITAL_BASED_OUTPATIENT_CLINIC_OR_DEPARTMENT_OTHER)

## 2024-04-27 DIAGNOSIS — R42 Dizziness and giddiness: Secondary | ICD-10-CM | POA: Diagnosis present

## 2024-04-27 DIAGNOSIS — I6501 Occlusion and stenosis of right vertebral artery: Secondary | ICD-10-CM

## 2024-04-27 DIAGNOSIS — Z79899 Other long term (current) drug therapy: Secondary | ICD-10-CM | POA: Diagnosis not present

## 2024-04-27 DIAGNOSIS — Z981 Arthrodesis status: Secondary | ICD-10-CM | POA: Diagnosis not present

## 2024-04-27 DIAGNOSIS — D649 Anemia, unspecified: Secondary | ICD-10-CM | POA: Diagnosis present

## 2024-04-27 DIAGNOSIS — N390 Urinary tract infection, site not specified: Secondary | ICD-10-CM | POA: Insufficient documentation

## 2024-04-27 DIAGNOSIS — R918 Other nonspecific abnormal finding of lung field: Secondary | ICD-10-CM | POA: Diagnosis present

## 2024-04-27 DIAGNOSIS — F329 Major depressive disorder, single episode, unspecified: Secondary | ICD-10-CM | POA: Diagnosis present

## 2024-04-27 DIAGNOSIS — M5412 Radiculopathy, cervical region: Secondary | ICD-10-CM | POA: Diagnosis present

## 2024-04-27 DIAGNOSIS — Z602 Problems related to living alone: Secondary | ICD-10-CM | POA: Diagnosis present

## 2024-04-27 DIAGNOSIS — Z7984 Long term (current) use of oral hypoglycemic drugs: Secondary | ICD-10-CM | POA: Diagnosis not present

## 2024-04-27 DIAGNOSIS — R55 Syncope and collapse: Secondary | ICD-10-CM | POA: Diagnosis not present

## 2024-04-27 DIAGNOSIS — E860 Dehydration: Secondary | ICD-10-CM | POA: Diagnosis present

## 2024-04-27 DIAGNOSIS — R2681 Unsteadiness on feet: Secondary | ICD-10-CM | POA: Diagnosis present

## 2024-04-27 DIAGNOSIS — R309 Painful micturition, unspecified: Secondary | ICD-10-CM | POA: Diagnosis present

## 2024-04-27 DIAGNOSIS — Z96612 Presence of left artificial shoulder joint: Secondary | ICD-10-CM | POA: Diagnosis present

## 2024-04-27 DIAGNOSIS — Z87891 Personal history of nicotine dependence: Secondary | ICD-10-CM | POA: Diagnosis not present

## 2024-04-27 DIAGNOSIS — J8410 Pulmonary fibrosis, unspecified: Secondary | ICD-10-CM | POA: Diagnosis present

## 2024-04-27 DIAGNOSIS — G8929 Other chronic pain: Secondary | ICD-10-CM | POA: Diagnosis present

## 2024-04-27 DIAGNOSIS — K521 Toxic gastroenteritis and colitis: Secondary | ICD-10-CM | POA: Diagnosis present

## 2024-04-27 DIAGNOSIS — W19XXXA Unspecified fall, initial encounter: Secondary | ICD-10-CM | POA: Diagnosis present

## 2024-04-27 DIAGNOSIS — I10 Essential (primary) hypertension: Secondary | ICD-10-CM | POA: Diagnosis present

## 2024-04-27 DIAGNOSIS — E119 Type 2 diabetes mellitus without complications: Secondary | ICD-10-CM | POA: Diagnosis present

## 2024-04-27 DIAGNOSIS — N179 Acute kidney failure, unspecified: Secondary | ICD-10-CM | POA: Diagnosis present

## 2024-04-27 DIAGNOSIS — D75839 Thrombocytosis, unspecified: Secondary | ICD-10-CM | POA: Diagnosis present

## 2024-04-27 DIAGNOSIS — Z96651 Presence of right artificial knee joint: Secondary | ICD-10-CM | POA: Diagnosis present

## 2024-04-27 DIAGNOSIS — T3695XA Adverse effect of unspecified systemic antibiotic, initial encounter: Secondary | ICD-10-CM | POA: Diagnosis present

## 2024-04-27 LAB — LIPID PANEL
Cholesterol: 137 mg/dL (ref 0–200)
HDL: 20 mg/dL — ABNORMAL LOW (ref >40)
LDL Cholesterol: 77 mg/dL (ref 0–99)
Total CHOL/HDL Ratio: 6.9 ratio
Triglycerides: 200 mg/dL — ABNORMAL HIGH (ref <150)
VLDL: 40 mg/dL (ref 0–40)

## 2024-04-27 LAB — ECHOCARDIOGRAM COMPLETE
AR max vel: 3.08 cm2
AV Area VTI: 3.24 cm2
AV Area mean vel: 3.18 cm2
AV Mean grad: 6 mmHg
AV Peak grad: 10.8 mmHg
Ao pk vel: 1.64 m/s
Area-P 1/2: 4.39 cm2
Height: 74 in
S' Lateral: 2.6 cm
Weight: 3280 [oz_av]

## 2024-04-27 LAB — CBG MONITORING, ED
Glucose-Capillary: 144 mg/dL — ABNORMAL HIGH (ref 70–99)
Glucose-Capillary: 117 mg/dL — ABNORMAL HIGH (ref 70–99)

## 2024-04-27 LAB — CBC
HCT: 36.5 % — ABNORMAL LOW (ref 39.0–52.0)
Hemoglobin: 11.9 g/dL — ABNORMAL LOW (ref 13.0–17.0)
MCH: 27.5 pg (ref 26.0–34.0)
MCHC: 32.6 g/dL (ref 30.0–36.0)
MCV: 84.5 fL (ref 80.0–100.0)
Platelets: 558 10*3/uL — ABNORMAL HIGH (ref 150–400)
RBC: 4.32 MIL/uL (ref 4.22–5.81)
RDW: 13.7 % (ref 11.5–15.5)
WBC: 7.7 10*3/uL (ref 4.0–10.5)
nRBC: 0 % (ref 0.0–0.2)

## 2024-04-27 LAB — URINALYSIS, ROUTINE W REFLEX MICROSCOPIC
Bilirubin Urine: NEGATIVE
Glucose, UA: NEGATIVE mg/dL
Hgb urine dipstick: NEGATIVE
Ketones, ur: NEGATIVE mg/dL
Leukocytes,Ua: NEGATIVE
Nitrite: NEGATIVE
Protein, ur: NEGATIVE mg/dL
Specific Gravity, Urine: 1.032 — ABNORMAL HIGH (ref 1.005–1.030)
pH: 5 (ref 5.0–8.0)

## 2024-04-27 LAB — BASIC METABOLIC PANEL WITH GFR
Anion gap: 10 (ref 5–15)
BUN: 16 mg/dL (ref 8–23)
CO2: 24 mmol/L (ref 22–32)
Calcium: 8.9 mg/dL (ref 8.9–10.3)
Chloride: 100 mmol/L (ref 98–111)
Creatinine, Ser: 1.86 mg/dL — ABNORMAL HIGH (ref 0.61–1.24)
GFR, Estimated: 36 mL/min — ABNORMAL LOW (ref >60)
Glucose, Bld: 148 mg/dL — ABNORMAL HIGH (ref 70–99)
Potassium: 4.4 mmol/L (ref 3.5–5.1)
Sodium: 134 mmol/L — ABNORMAL LOW (ref 135–145)

## 2024-04-27 LAB — TSH: TSH: 2.469 u[IU]/mL (ref 0.350–4.500)

## 2024-04-27 LAB — GLUCOSE, CAPILLARY
Glucose-Capillary: 133 mg/dL — ABNORMAL HIGH (ref 70–99)
Glucose-Capillary: 135 mg/dL — ABNORMAL HIGH (ref 70–99)

## 2024-04-27 MED ORDER — LACTATED RINGERS IV SOLN: INTRAVENOUS | Status: DC

## 2024-04-27 MED ORDER — ROSUVASTATIN CALCIUM 20 MG PO TABS
20.0000 mg | ORAL_TABLET | Freq: Every day | ORAL | Status: DC
  Administered 2024-04-27 – 2024-04-28 (×2): 20 mg via ORAL
  Filled 2024-04-27 (×2): qty 1

## 2024-04-27 MED ORDER — LACTATED RINGERS IV BOLUS: 1000.0000 mL | Freq: Once | INTRAVENOUS | Status: DC

## 2024-04-27 NOTE — Assessment & Plan Note (Addendum)
 No acute intracranial abnormality seen on MRI brain.  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  Continue to monitor. -start ASA 81 mg -goal LDL < 50, start moderate intensity statin Crestor  20mg 

## 2024-04-27 NOTE — Assessment & Plan Note (Signed)
 MDD: Cont home cymbalta  HTN: Holding Hyzaar as above.  Chronic low back pain: continue home pain regimen, follow up with ortho outpatient. T2DM: SSI, hold home metformin  - restart other home meds as needed
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
## 2024-04-27 NOTE — Progress Notes (Signed)
   04/27/24 0903  Orthostatic Lying   BP- Lying 146/71  Pulse- Lying 95  Orthostatic Sitting  BP- Sitting 142/72  Pulse- Sitting 98  Orthostatic Standing at 0 minutes  BP- Standing at 0 minutes 142/70  Pulse- Standing at 0 minutes 114  Orthostatic Standing at 3 minutes  BP- Standing at 3 minutes 158/87  Pulse- Standing at 3 minutes 118    Caden Fatica S, PT DPT Acute Rehabilitation Services Secure Chat Preferred  Office [phone]

## 2024-04-27 NOTE — Progress Notes (Signed)
 Daily Progress Note Intern Pager: [phone]  Patient name: Brian Dominguez Medical record number: [id number] Date of birth: 09-05-1946 Age: 78 y.o. Gender: male  Primary Care Provider: [facility], [facility] Consultants: none Code Status: Full  Pt Overview and Major Events to Date:  5/18: Admitted  Assessment and Plan:  This is a 78 year old male patient presenting with AKI in the setting of dehydration and syncope.  He has a past medical history of hypertension, chronic low back pain, type 2 diabetes and major depressive disorder. Assessment & Plan AKI (acute kidney injury) (HCC) Cr rose today to 1.8, patient reports improvement in symptoms.  - s/p 1.5L NS bolus - Encourage PO - continue holding home Hyzaar (resume as AKI improves) - Tylenol  prn for pain - Echo to assess cardiac function before giving more fluids - PT/OT - AM BMP, CBC Syncope Patient reports being dizzy and losing consciousness for an unknown amount of time prior to his arrival in the emergency department.  CT head on arrival was negative for acute intracranial process but did show occlusion of our right vertebral artery described below.  MRI was negative for any infarct.  Will proceed with syncope workup. -Lipids collected, LDL is 77 -Begin Crestor  20 mg - Collect TSH today -Echo ordered pending UTI (urinary tract infection) Patient was diagnosed with a UTI on 5/13, continues to have burning with urination and mild suprapubic tenderness -repeat UA with culture - Bladder scan to assess for urinary retention Occlusion of right vertebral artery No acute intracranial abnormality seen on MRI brain.  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  Continue to monitor. -start ASA 81 mg -goal LDL < 50, start moderate intensity statin Crestor  20mg  Chronic health problem MDD: Cont home cymbalta  HTN: Holding Hyzaar as above.  Chronic low back pain: continue home pain regimen, follow up  with ortho outpatient. T2DM: SSI, hold home metformin  - restart other home meds as needed  FEN/GI: regular PPx: Lovenox  Dispo:Home pending clinical improvement .    Subjective:  Patient reports continued burning with urination and abdominal discomfort but has no other complaints.  Objective: Temp:  [97.6 F (36.4 C)-98.4 F (36.9 C)] 97.9 F (36.6 C) (05/19 0600) Pulse Rate:  [76-98] 90 (05/19 0600) Resp:  [16-19] 18 (05/19 0600) BP: (101-158)/(63-84) 150/77 (05/19 0600) SpO2:  [99 %-100 %] 100 % (05/19 0600) Weight:  [93 kg] 93 kg (05/18 1207) Physical Exam: General: Well-appearing gentleman no distress Cardiovascular: RRR, no M/R/G Respiratory: CTAB, no increased work of breathing Abdomen: Flat, soft, nontender Extremities: 2+ peripheral pulses no evidence of edema  Laboratory: Most recent CBC Lab Results  Component Value Date   WBC 7.7 04/27/2024   HGB 11.9 (L) 04/27/2024   HCT 36.5 (L) 04/27/2024   MCV 84.5 04/27/2024   PLT 558 (H) 04/27/2024   Most recent BMP    Latest Ref Rng & Units 04/27/2024    6:16 AM  BMP  Glucose 70 - 99 mg/dL 147   BUN 8 - 23 mg/dL 16   Creatinine 8.29 - 1.24 mg/dL 5.62   Sodium 130 - 865 mmol/L 134   Potassium 3.5 - 5.1 mmol/L 4.4   Chloride 98 - 111 mmol/L 100   CO2 22 - 32 mmol/L 24   Calcium  8.9 - 10.3 mg/dL 8.9    Rayma Calandra, DO 04/27/2024, 7:44 AM  PGY-1, [facility] FPTS Intern pager: [phone], text pages welcome Secure chat group [facility] Teaching  Service

## 2024-04-27 NOTE — Assessment & Plan Note (Addendum)
 Cr rose today to 1.8, patient reports improvement in symptoms.  - s/p 1.5L NS bolus - Encourage PO - continue holding home Hyzaar (resume as AKI improves) - Tylenol  prn for pain - Echo to assess cardiac function before giving more fluids - PT/OT - AM BMP, CBC

## 2024-04-27 NOTE — Assessment & Plan Note (Signed)
 Patient reports being dizzy and losing consciousness for an unknown amount of time prior to his arrival in the emergency department.  CT head on arrival was negative for acute intracranial process but did show occlusion of our right vertebral artery described below.  MRI was negative for any infarct.  Will proceed with syncope workup. -Lipids collected, LDL is 77 -Begin Crestor  20 mg - Collect TSH today -Echo ordered pending
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
## 2024-04-27 NOTE — Assessment & Plan Note (Addendum)
 Patient was diagnosed with a UTI on 5/13, continues to have burning with urination and mild suprapubic tenderness -repeat UA with culture - Bladder scan to assess for urinary retention

## 2024-04-27 NOTE — Plan of Care (Signed)
  Problem: Coping: Goal: Ability to adjust to condition or change in health will improve Outcome: Progressing   Problem: Fluid Volume: Goal: Ability to maintain a balanced intake and output will improve Outcome: Progressing   Problem: Health Behavior/Discharge Planning: Goal: Ability to identify and utilize available resources and services will improve Outcome: Progressing Goal: Ability to manage health-related needs will improve Outcome: Progressing

## 2024-04-27 NOTE — ED Notes (Signed)
 Pt to MRI

## 2024-04-27 NOTE — ED Notes (Signed)
 Lab contacted for add on lipid panel

## 2024-04-27 NOTE — ED Notes (Addendum)
 Pt resting.

## 2024-04-27 NOTE — ED Notes (Signed)
 Admitting at bedside

## 2024-04-28 ENCOUNTER — Other Ambulatory Visit (HOSPITAL_COMMUNITY): Payer: Self-pay

## 2024-04-28 ENCOUNTER — Telehealth (HOSPITAL_COMMUNITY): Payer: Self-pay | Admitting: Pharmacy Technician

## 2024-04-28 DIAGNOSIS — R55 Syncope and collapse: Secondary | ICD-10-CM | POA: Diagnosis not present

## 2024-04-28 DIAGNOSIS — N179 Acute kidney failure, unspecified: Secondary | ICD-10-CM | POA: Diagnosis not present

## 2024-04-28 DIAGNOSIS — R918 Other nonspecific abnormal finding of lung field: Secondary | ICD-10-CM | POA: Diagnosis present

## 2024-04-28 LAB — GLUCOSE, CAPILLARY
Glucose-Capillary: 159 mg/dL — ABNORMAL HIGH (ref 70–99)
Glucose-Capillary: 183 mg/dL — ABNORMAL HIGH (ref 70–99)
Glucose-Capillary: 118 mg/dL — ABNORMAL HIGH (ref 70–99)

## 2024-04-28 LAB — BASIC METABOLIC PANEL WITH GFR
Anion gap: 8 (ref 5–15)
BUN: 13 mg/dL (ref 8–23)
CO2: 28 mmol/L (ref 22–32)
Calcium: 9.2 mg/dL (ref 8.9–10.3)
Chloride: 101 mmol/L (ref 98–111)
Creatinine, Ser: 1.35 mg/dL — ABNORMAL HIGH (ref 0.61–1.24)
GFR, Estimated: 53 mL/min — ABNORMAL LOW (ref >60)
Glucose, Bld: 181 mg/dL — ABNORMAL HIGH (ref 70–99)
Potassium: 4.7 mmol/L (ref 3.5–5.1)
Sodium: 137 mmol/L (ref 135–145)

## 2024-04-28 MED ORDER — POLYETHYLENE GLYCOL 3350 17 GM/SCOOP PO POWD: 17.0000 g | Freq: Every day | ORAL | Status: DC | PRN

## 2024-04-28 MED ORDER — ASPIRIN 81 MG PO TBEC
81.0000 mg | DELAYED_RELEASE_TABLET | Freq: Every day | ORAL | 0 refills | Status: AC
  Filled 2024-04-28: qty 30, 30d supply, fill #0

## 2024-04-28 MED ORDER — ROSUVASTATIN CALCIUM 20 MG PO TABS
20.0000 mg | ORAL_TABLET | Freq: Every day | ORAL | 0 refills | Status: DC
  Filled 2024-04-28: qty 30, 30d supply, fill #0

## 2024-04-28 MED ORDER — LIDOCAINE 5 % EX PTCH
1.0000 | MEDICATED_PATCH | CUTANEOUS | 0 refills | Status: AC
  Filled 2024-04-28: qty 30, 30d supply, fill #0

## 2024-04-28 NOTE — Discharge Summary (Signed)
 Family Medicine Teaching [facility] Discharge Summary  Patient name: Brian Dominguez Medical record number: [id number] Date of birth: [birth year] Age: 78 y.o. Gender: male Date of Admission: 04/26/2024  Date of Discharge: 04/28/2024 Admitting Physician: Albin Huh, MD  Primary Care Provider: Center, [facility] Medical Consultants: none  Indication for Hospitalization: AKI, syncope workup  Discharge Diagnoses/Problem List:  Principal Problem for Admission: AKI Other Problems addressed during stay:  Principal Problem:   AKI (acute kidney injury) (HCC) Active Problems:   T2DM (type 2 diabetes mellitus) (HCC)   Occlusion of right vertebral artery   Chronic low back pain   Chronic health problem   Dizziness   UTI (urinary tract infection)   Syncope   Pulmonary nodules    Brief Hospital Course:  Brian Dominguez is a 78 y.o.male with a history of chronic back pain status post fusions and laminectomy, chronic prostatitis, T2DM, hypertension, MDD, OA who was admitted to the [facility] Medicine Teaching Service at [facility] for AKI and incidental finding of right vertebral artery occlusion. His hospital course is detailed below:  AKI/UTI Creatinine baseline around 1, on admission 1.77. Suspect prerenal 2/2 poor PO and loose stools (potentially from recent antibx course).  Recent UTI but low suspicion for UTI or postrenal etiology currently.  Received 1 L bolus and was euvolemic. Creatinine worsened overnight, patient underwent echocardiogram to assess for appropriate cardiac function before receiving further IV hydration.  Patient also continued to have burning with urination, repeat UA collected which did not show further infection.  Bladder scan showed no retention. PO hydration was encouraged throughout patient's stay.  Creatinine improved to near baseline prior to discharge. Patient with continued burning, so GC/Chlamydia urine test was collected prior to discharge, will update patient when  results are available. Symptoms may be related to BPH, see outpatient recommendations below.  Syncope Patient reported passing out for an unknown amount of time on admission through the emergency department.  They completed a CT and CTA which showed occlusion of right vertebral artery as described below but no other acute intra-cranial processes.  Lipid panel was collected and patient's LDL was 77, given his history of T2DM he was started on moderate intensity statin therapy.  Echocardiogram showed LVEF 60-65% and normal function.  TSH was 2.4.  Orthostatic vitals on admission were negative. He remained neurologically appropriate through out his hospitalization, recommend follow up with pcp outpatient.  Occlusion of right vertebral artery Incidental finding on syncopal workup.  "Functionally occluded" and "age-indeterminate" per read.  Neuro consulted and recommended MRI which showed chronic infarcts but no acute. Patient appears to have adequately compensated with collateral flow. Started patient on moderate intensity statin therapy and ASA. Recommend follow up outpatient.  Pulmonary Nodules seen on CTA neck 11 mm right upper lobe pulmonary nodule. Consider one of the following for both low-risk and high-risk individuals: (a) repeat chest CT in 3 months, (B) follow-up PET-CT at this time or (c) tissue sampling.   Fibrocystic changes on CXR New interstitial opacities throughout the left mid and lower lung and throughout the entire right lung when compared to 2022. This likely corresponds to incidental fibrotic changes seen on recent CTA neck. Follow-up nonemergent chest CT should be considered for better evaluation.   Other chronic conditions were medically managed with home medications and formulary alternatives as necessary (T2DM, MDD, HTN, chronic back pain)  PCP Follow-up Recommendations: Consider interstitial cystitis workup  Chest CT in 3 months 07/2024 for 11 mm right upper lobe pulmonary  nodule and for left mid and lower lung fibrocystic changes on CXR Consider workup and treatment for prostate enlargement Recommend urine trichomonas outpatient if symptoms persist.   Disposition: home  Discharge Condition: stable  Discharge Exam:  Vitals:   04/28/24 0446 04/28/24 0751  BP: (!) 141/78 (!) 124/54  Pulse: 85 100  Resp: 20 16  Temp: 98.1 F (36.7 C) 97.6 F (36.4 C)  SpO2: 100% 100%   General: Well-appearing elderly gentleman, no distress Respiratory: CTAB, no increased work of breathing Cardiac: RRR, no M/R/G Extremities: No peripheral edema, appropriate peripheral pulses  Significant Procedures: none  Significant Labs and Imaging:  Recent Labs  Lab 04/26/24 1240 04/27/24 0616  WBC 10.5 7.7  HGB 12.4* 11.9*  HCT 37.2* 36.5*  PLT 610* 558*   Recent Labs  Lab 04/26/24 1240 04/27/24 0616 04/28/24 0723  NA 134* 134* 137  K 3.7 4.4 4.7  CL 96* 100 101  CO2 27 24 28   GLUCOSE 125* 148* 181*  BUN 15 16 13   CREATININE 1.77* 1.86* 1.35*  CALCIUM  9.5 8.9 9.2  ALKPHOS 53  --   --   AST 18  --   --   ALT 11  --   --   ALBUMIN 3.4*  --   --    CXR 5/18 New interstitial opacities throughout the left mid and lower lung and throughout the entire right lung when compared to 2022. This likely corresponds to fibrotic changes seen on recent CT. Follow-up nonemergent chest CT should be considered to better evaluate.  CT head 5/18 1.  No acute intracranial finding. 2. 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 3. Chronic appearing infarct within the right cerebellar hemisphere. 4. Cerebral atrophy. 5. Mild paranasal sinus mucosal thickening. 6. Left frontoparietal scalp lipoma.   CTA neck 5/18 1. The right vertebral artery appears functionally occluded within the neck with only faint intermittent enhancement present within this vessel. No prior vascular imaging of the neck is available and this is  age-indeterminate. 2. Streak/beam hardening artifact partially obscures the left vertebral artery V1 and proximal V2 segments. Within this limitation, the left vertebral artery is patent within the neck without hemodynamically significant stenosis. 3. Streak/beam hardening artifact partially obscures the proximal right common carotid artery. Within this limitation, the common carotid and internal carotid arteries are patent within the neck without hemodynamically significant stenosis (50% or greater). Atherosclerotic plaque bilaterally, as described. 4. Aortic Atherosclerosis (ICD10-I70.0). 5. Changes of fibrotic lung disease at the imaged levels, which would be better characterized with a dedicated chest CT. 6. 11 mm right upper lobe pulmonary nodule. Consider one of the following for both low-risk and high-risk individuals: (a) repeat chest CT in 3 months, (B) follow-up PET-CT at this time or (c) tissue sampling. This recommendation follows the consensus statement: Guidelines for Management of Incidental Pulmonary Nodules Detected on CT Images: From the Fleischner Society 2017; Radiology 2017; 284:228-243.   CTA Head 5/18 1. Some reconstitution of enhancement is demonstrated within a diminutive intracranial right vertebral artery, likely due to retrograde flow. Enhancement is also present within the proximal right PICA. 2. Atherosclerotic plaque within the intracranial internal carotid arteries with sites of mild stenosis.  MRI brain 5/19 1. No acute intracranial abnormality. 2. 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 Mild to moderate for age additional small vessel disease signal changes in the brain.   Results/Tests Pending at Time of Discharge: none  Discharge  Medications:  Allergies as of 04/28/2024       Reactions   Enalapril Swelling, Other (See Comments)   Angioedema of the lips with enalapril        Medication List      STOP taking these medications    cephALEXin  500 MG capsule Commonly known as: KEFLEX    DULoxetine  60 MG capsule Commonly known as: CYMBALTA        TAKE these medications    aspirin  EC 81 MG tablet Take 1 tablet (81 mg total) by mouth daily. Swallow whole.   diclofenac  Sodium 1 % Gel Commonly known as: VOLTAREN  Apply 1 Application topically 4 (four) times daily as needed (pain).   fluticasone  50 MCG/ACT nasal spray Commonly known as: FLONASE  Place 2 sprays into both nostrils daily as needed for allergies.   gabapentin  600 MG tablet Commonly known as: NEURONTIN  Take 600 mg by mouth 3 (three) times daily as needed (pain).   lidocaine  5 % Commonly known as: LIDODERM  Place 1 patch onto the skin daily. Remove & Discard patch within 12 hours or as directed by MD   losartan-hydrochlorothiazide  100-25 MG tablet Commonly known as: HYZAAR Take 1 tablet by mouth daily.   metFORMIN  1000 MG tablet Commonly known as: GLUCOPHAGE  TAKE 1 TABLET (1,000 MG TOTAL) BY MOUTH 2 (TWO) TIMES DAILY WITH A MEAL.   Narcan 4 MG/0.1ML Liqd nasal spray kit Generic drug: naloxone Place 1 spray into the nose once as needed (OD).   omeprazole  40 MG capsule Commonly known as: PRILOSEC Take 40 mg by mouth 2 (two) times daily as needed (acid reflux).   oxyCODONE -acetaminophen  10-325 MG tablet Commonly known as: PERCOCET Take 1 tablet by mouth 4 (four) times daily.   polyethylene glycol powder 17 GM/SCOOP powder Commonly known as: GLYCOLAX /MIRALAX  Take 17 g by mouth daily as needed for mild constipation.   rosuvastatin  20 MG tablet Commonly known as: CRESTOR  Take 1 tablet (20 mg total) by mouth daily. Start taking on: Apr 29, 2024   tiZANidine  4 MG tablet Commonly known as: ZANAFLEX  Take 4 mg by mouth 3 (three) times daily.   Vitamin D  (Ergocalciferol ) 1.25 MG (50000 UNIT) Caps capsule Commonly known as: DRISDOL Take 50,000 Units by mouth once a week. Thursdays        Discharge  Instructions: Please refer to Patient Instructions section of EMR for full details.  Patient was counseled important signs and symptoms that should prompt return to medical care, changes in medications, dietary instructions, activity restrictions, and follow up appointments.   Follow-Up Appointments:  Follow-up Information     Center, [facility] Medical Follow up.   Contact information: [address] [place] [address] [phone]                 Rayma Calandra, DO 04/28/2024, 12:20 PM PGY-1, [facility]

## 2024-04-28 NOTE — Telephone Encounter (Signed)
 Pharmacy Patient Advocate Encounter   Received notification from Inpatient Request that prior authorization for Lidocaine  5% patches is required/requested.   Insurance verification completed.   The patient is insured through [facility] .   Per test claim: PA required; PA submitted to above mentioned insurance via CoverMyMeds Key/confirmation #/EOC [id number] Status is pending

## 2024-04-28 NOTE — Telephone Encounter (Signed)
 Pharmacy Patient Advocate Encounter  Received notification from [facility] that Prior Authorization for Lidocaine  5% patches  has been APPROVED from 04/28/2024 to 12/09/2024   PA #/Case ID/Reference #: [id number]

## 2024-04-28 NOTE — Discharge Instructions (Addendum)
 Dear Brian Dominguez,  Thank you for letting us  participate in your care. You were hospitalized for neck pain, back pain, and syncope and diagnosed with AKI (acute kidney injury) (HCC). You were treated with IV fluids and work up for causes of transient loss of consciousness. Some other incidental findings were seen on imaging that you should follow up with your PCP regarding.    POST-HOSPITAL & CARE INSTRUCTIONS Bring your discharge summary to your PCP, so they can follow up on hospital problems.  Read the syncope handout to know about aspects to be concerned about in future.  Go to your follow up appointments (listed below)   DOCTOR'S APPOINTMENT   No future appointments.  Follow-up Information     Center, [facility] Medical Follow up.   Contact information: [address] [place] [address] [phone]                 Take care and be well!  Family Medicine Teaching Service Inpatient Team [place]  [facility]  [address] [place], [place] [address] [phone]

## 2024-04-28 NOTE — Discharge Planning (Signed)
 Patient alert. IV access removed. Discharge teaching given to patient. Patient verbalized understanding of all teaching including medications. Discharge summary placed in discharge packet and put with patient belongings. Patient will be sent to discharge lounge where transportation voucher can be provided. Transportation will take him home.

## 2024-04-28 NOTE — Progress Notes (Signed)
 Mobility Specialist Progress Note:    04/28/24 1000  Orthostatic Sitting  BP- Sitting 130/62 (80)  Orthostatic Standing at 0 minutes  BP- Standing at 0 minutes 126/72 (88)  Mobility  Activity Ambulated with assistance in room  Level of Assistance Contact guard assist, steadying assist  Assistive Device Other (Comment) (IV Pole)  Distance Ambulated (ft) 40 ft  Activity Response Tolerated well  Mobility Referral Yes  Mobility visit 1 Mobility  Mobility Specialist Start Time (ACUTE ONLY) [id number]  Mobility Specialist Stop Time (ACUTE ONLY) 0954  Mobility Specialist Time Calculation (min) (ACUTE ONLY) 11 min   Pt received in bed and agreeable. BP stable, despite c/o lightheadedness. Distance limited d/t this. Felt better once sitting. Pt left on EOB with call bell and all needs met.  D'Vante Nolon Baxter Mobility Specialist Please contact via Special educational needs teacher or Rehab office at [phone]

## 2024-04-29 LAB — URINE CULTURE: Culture: NO GROWTH

## 2024-04-29 LAB — GC/CHLAMYDIA PROBE AMP (~~LOC~~) NOT AT ARMC
Chlamydia: NEGATIVE
Comment: NEGATIVE
Comment: NORMAL
Neisseria Gonorrhea: NEGATIVE

## 2024-05-05 ENCOUNTER — Other Ambulatory Visit: Payer: Self-pay | Admitting: Orthopedic Surgery

## 2024-05-07 NOTE — Progress Notes (Signed)
 Surgical Instructions   Your procedure is scheduled on May 14, 2024. Report to [facility] Main Entrance "A" at 9:00 A.M., then check in with the Admitting office. Any questions or running late day of surgery: call [phone]  Questions prior to your surgery date: call [phone], Monday-Friday, 8am-4pm. If you experience any cold or flu symptoms such as cough, fever, chills, shortness of breath, etc. between now and your scheduled surgery, please notify us  at the above number.     Remember:  Do not eat after midnight the night before your surgery  You may drink clear liquids until 9:00 the morning of your surgery.   Clear liquids allowed are: Water , Non-Citrus Juices (without pulp), Carbonated Beverages, Clear Tea (no milk, honey, etc.), Black Coffee Only (NO MILK, CREAM OR POWDERED CREAMER of any kind), and Gatorade.  Patient Instructions  The night before surgery:  No food after midnight. ONLY clear liquids after midnight   The day of surgery (if you have diabetes): Drink ONE (1) 12 oz G2 given to you in your pre admission testing appointment by 9:00 the morning of surgery. Drink in one sitting. Do not sip.  This drink was given to you during your hospital  pre-op appointment visit.  Nothing else to drink after completing the  12 oz bottle of G2.         If you have questions, please contact your surgeon's office.    Take these medicines the morning of surgery with A SIP OF WATER   rosuvastatin  (CRESTOR )   May take these medicines IF NEEDED: gabapentin  (NEURONTIN )  fluticasone  (FLONASE )  naloxone (NARCAN)  omeprazole  (PRILOSEC)  PERCOCET GLYCOLAX /MIRALAX  tiZANidine  (ZANAFLEX )    One week prior to surgery, STOP taking any Aspirin  (unless otherwise instructed by your surgeon) Aleve, Naproxen, Ibuprofen, Motrin, Advil, Goody's, BC's, all herbal medications, fish oil, and non-prescription vitamins. THIS INCLUDES YOUR diclofenac  Sodium (VOLTAREN ) 1 % GEL lidocaine   (LIDODERM ) 5 ,          FOLLOW INSTRUCTIONS GIVEN TO YOU BY YOUR   SURGEON REGARDING ASPIRIN , IF NO INSTRUCTIONS REACH OUT TO OFFICE.   WHAT DO I DO ABOUT MY DIABETES MEDICATION?   Do not take oral diabetes medicines metFORMIN  (GLUCOPHAGE )  the morning of surgery.   The day of surgery, do not take other diabetes injectables, including Byetta (exenatide), Bydureon (exenatide ER), Victoza (liraglutide), or Trulicity (dulaglutide).  If your CBG is greater than 220 mg/dL, you may take  of your sliding scale (correction) dose of insulin .   HOW TO MANAGE YOUR DIABETES BEFORE AND AFTER SURGERY  Why is it important to control my blood sugar before and after surgery? Improving blood sugar levels before and after surgery helps healing and can limit problems. A way of improving blood sugar control is eating a healthy diet by:  Eating less sugar and carbohydrates  Increasing activity/exercise  Talking with your doctor about reaching your blood sugar goals High blood sugars (greater than 180 mg/dL) can raise your risk of infections and slow your recovery, so you will need to focus on controlling your diabetes during the weeks before surgery. Make sure that the doctor who takes care of your diabetes knows about your planned surgery including the date and location.  How do I manage my blood sugar before surgery? Check your blood sugar at least 4 times a day, starting 2 days before surgery, to make sure that the level is not too high or low.  Check your blood sugar the morning of your  surgery when you wake up and every 2 hours until you get to the Short Stay unit.  If your blood sugar is less than 70 mg/dL, you will need to treat for low blood sugar: Do not take insulin . Treat a low blood sugar (less than 70 mg/dL) with  cup of clear juice (cranberry or apple), 4 glucose tablets, OR glucose gel. Recheck blood sugar in 15 minutes after treatment (to make sure it is greater than 70 mg/dL). If  your blood sugar is not greater than 70 mg/dL on recheck, call [phone] for further instructions. Report your blood sugar to the short stay nurse when you get to Short Stay.  If you are admitted to the hospital after surgery: Your blood sugar will be checked by the staff and you will probably be given insulin  after surgery (instead of oral diabetes medicines) to make sure you have good blood sugar levels. The goal for blood sugar control after surgery is 80-180 mg/dL.              Do NOT Smoke (Tobacco/Vaping) for 24 hours prior to your procedure.  If you use a CPAP at night, you may bring your mask/headgear for your overnight stay.   You will be asked to remove any contacts, glasses, piercing's, hearing aid's, dentures/partials prior to surgery. Please bring cases for these items if needed.    Patients discharged the day of surgery will not be allowed to drive home, and someone needs to stay with them for 24 hours.  SURGICAL WAITING ROOM VISITATION Patients may have no more than 2 support people in the waiting area - these visitors may rotate.   Pre-op nurse will coordinate an appropriate time for 1 ADULT support person, who may not rotate, to accompany patient in pre-op.  Children under the age of 52 must have an adult with them who is not the patient and must remain in the main waiting area with an adult.  If the patient needs to stay at the hospital during part of their recovery, the visitor guidelines for inpatient rooms apply.  Please refer to the [facility] website for the visitor guidelines for any additional information.   If you received a COVID test during your pre-op visit  it is requested that you wear a mask when out in public, stay away from anyone that may not be feeling well and notify your surgeon if you develop symptoms. If you have been in contact with anyone that has tested positive in the last 10 days please notify you surgeon.      Pre-operative 5 CHG  Bathing Instructions   You can play a key role in reducing the risk of infection after surgery. Your skin needs to be as free of germs as possible. You can reduce the number of germs on your skin by washing with CHG (chlorhexidine  gluconate) soap before surgery. CHG is an antiseptic soap that kills germs and continues to kill germs even after washing.   DO NOT use if you have an allergy to chlorhexidine /CHG or antibacterial soaps. If your skin becomes reddened or irritated, stop using the CHG and notify one of our RNs at [phone].   Please shower with the CHG soap starting 4 days before surgery using the following schedule:     Please keep in mind the following:  DO NOT shave, including legs and underarms, starting the day of your first shower.   You may shave your face at any point before/day of surgery.  Place clean sheets on your bed the day you start using CHG soap. Use a clean washcloth (not used since being washed) for each shower. DO NOT sleep with pets once you start using the CHG.   CHG Shower Instructions:  Wash your face and private area with normal soap. If you choose to wash your hair, wash first with your normal shampoo.  After you use shampoo/soap, rinse your hair and body thoroughly to remove shampoo/soap residue.  Turn the water  OFF and apply about 3 tablespoons (45 ml) of CHG soap to a CLEAN washcloth.  Apply CHG soap ONLY FROM YOUR NECK DOWN TO YOUR TOES (washing for 3-5 minutes)  DO NOT use CHG soap on face, private areas, open wounds, or sores.  Pay special attention to the area where your surgery is being performed.  If you are having back surgery, having someone wash your back for you may be helpful. Wait 2 minutes after CHG soap is applied, then you may rinse off the CHG soap.  Pat dry with a clean towel  Put on clean clothes/pajamas   If you choose to wear lotion, please use ONLY the CHG-compatible lotions that are listed below.  Additional instructions for  the day of surgery: DO NOT APPLY any lotions, deodorants, cologne, or perfumes.   Do not bring valuables to the hospital. [facility] is not responsible for any belongings/valuables. Do not wear nail polish, gel polish, artificial nails, or any other type of covering on natural nails (fingers and toes) Do not wear jewelry or makeup Put on clean/comfortable clothes.  Please brush your teeth.  Ask your nurse before applying any prescription medications to the skin.     CHG Compatible Lotions   Aveeno Moisturizing lotion  Cetaphil Moisturizing Cream  Cetaphil Moisturizing Lotion  Clairol Herbal Essence Moisturizing Lotion, Dry Skin  Clairol Herbal Essence Moisturizing Lotion, Extra Dry Skin  Clairol Herbal Essence Moisturizing Lotion, Normal Skin  Curel Age Defying Therapeutic Moisturizing Lotion with Alpha Hydroxy  Curel Extreme Care Body Lotion  Curel Soothing Hands Moisturizing Hand Lotion  Curel Therapeutic Moisturizing Cream, Fragrance-Free  Curel Therapeutic Moisturizing Lotion, Fragrance-Free  Curel Therapeutic Moisturizing Lotion, Original Formula  Eucerin Daily Replenishing Lotion  Eucerin Dry Skin Therapy Plus Alpha Hydroxy Crme  Eucerin Dry Skin Therapy Plus Alpha Hydroxy Lotion  Eucerin Original Crme  Eucerin Original Lotion  Eucerin Plus Crme Eucerin Plus Lotion  Eucerin TriLipid Replenishing Lotion  Keri Anti-Bacterial Hand Lotion  Keri Deep Conditioning Original Lotion Dry Skin Formula Softly Scented  Keri Deep Conditioning Original Lotion, Fragrance Free Sensitive Skin Formula  Keri Lotion Fast Absorbing Fragrance Free Sensitive Skin Formula  Keri Lotion Fast Absorbing Softly Scented Dry Skin Formula  Keri Original Lotion  Keri Skin Renewal Lotion Keri Silky Smooth Lotion  Keri Silky Smooth Sensitive Skin Lotion  Nivea Body Creamy Conditioning Oil  Nivea Body Extra Enriched Lotion  Nivea Body Original Lotion  Nivea Body Sheer Moisturizing Lotion Nivea  Crme  Nivea Skin Firming Lotion  NutraDerm 30 Skin Lotion  NutraDerm Skin Lotion  NutraDerm Therapeutic Skin Cream  NutraDerm Therapeutic Skin Lotion  ProShield Protective Hand Cream  Provon moisturizing lotion  Please read over the following fact sheets that you were given.

## 2024-05-08 ENCOUNTER — Other Ambulatory Visit: Payer: Self-pay

## 2024-05-08 ENCOUNTER — Encounter (HOSPITAL_COMMUNITY): Payer: Self-pay

## 2024-05-08 ENCOUNTER — Encounter (HOSPITAL_COMMUNITY)
Admission: RE | Admit: 2024-05-08 | Discharge: 2024-05-08 | Disposition: A | Discharge status: Home / Self Care | Source: Ambulatory Visit | Attending: Orthopedic Surgery | Admitting: Orthopedic Surgery

## 2024-05-08 VITALS — BP 147/64 | HR 99 | Temp 98.2°F | Resp 18 | Ht 74.0 in | Wt 199.0 lb

## 2024-05-08 DIAGNOSIS — Z01812 Encounter for preprocedural laboratory examination: Secondary | ICD-10-CM | POA: Insufficient documentation

## 2024-05-08 DIAGNOSIS — E119 Type 2 diabetes mellitus without complications: Secondary | ICD-10-CM | POA: Diagnosis not present

## 2024-05-08 DIAGNOSIS — Z01818 Encounter for other preprocedural examination: Secondary | ICD-10-CM

## 2024-05-08 LAB — SURGICAL PCR SCREEN
MRSA, PCR: NEGATIVE
Staphylococcus aureus: NEGATIVE

## 2024-05-08 LAB — TYPE AND SCREEN
ABO/RH(D): B POS
Antibody Screen: NEGATIVE

## 2024-05-08 LAB — GLUCOSE, CAPILLARY: Glucose-Capillary: 153 mg/dL — ABNORMAL HIGH (ref 70–99)

## 2024-05-08 LAB — HEMOGLOBIN A1C
Hgb A1c MFr Bld: 7.1 % — ABNORMAL HIGH (ref 4.8–5.6)
Mean Plasma Glucose: 157.07 mg/dL

## 2024-05-08 NOTE — Progress Notes (Addendum)
 PCP - Cliffton Dama Cardiologist - denies  PPM/ICD - denies Device Orders -  Rep Notified -   Chest x-ray - 04/26/24-[facility]view EKG - 04/27/24 Stress Test - 06/3021 CE ECHO - 04/27/24 Cardiac Cath - denies  Sleep Study - denies CPAP -   Fasting Blood Sugar - 120-125 Checks Blood Sugar q am  Last dose of GLP1 agonist-  na GLP1 instructions:   Blood Thinner Instructions:na Aspirin  Instructions:pt states he will contact the office  ERAS Protcol - clears until 0900 PRE-SURGERY Ensure or G2- G2  COVID TEST- na   Anesthesia review: yes-recent hospitalization for AKI 5/19-520. Pt denies having any syncopal episodes since that hospitalization.   Patient denies shortness of breath, fever, cough and chest pain at PAT appointment   All instructions explained to the patient, with a verbal understanding of the material. Patient agrees to go over the instructions while at home for a better understanding. The opportunity to ask questions was provided.

## 2024-05-08 NOTE — Progress Notes (Addendum)
 Surgical Instructions   Your procedure is scheduled on May 14, 2024. Report to [facility] Main Entrance "A" at 9:00 A.M., then check in with the Admitting office. Any questions or running late day of surgery: call [phone]  Questions prior to your surgery date: call [phone], Monday-Friday, 8am-4pm. If you experience any cold or flu symptoms such as cough, fever, chills, shortness of breath, etc. between now and your scheduled surgery, please notify us  at the above number.     Remember:  Do not eat after midnight the night before your surgery  You may drink clear liquids until 9:00 the morning of your surgery.   Clear liquids allowed are: Water , Non-Citrus Juices (without pulp), Carbonated Beverages, Clear Tea (no milk, honey, etc.), Black Coffee Only (NO MILK, CREAM OR POWDERED CREAMER of any kind), and Gatorade.  Patient Instructions  The night before surgery:  No food after midnight. ONLY clear liquids after midnight   The day of surgery (if you have diabetes): Drink ONE (1) 12 oz G2 given to you in your pre admission testing appointment by 9:00 the morning of surgery. Drink in one sitting. Do not sip.  This drink was given to you during your hospital  pre-op appointment visit.  Nothing else to drink after completing the  12 oz bottle of G2.         If you have questions, please contact your surgeon's office.    Take these medicines the morning of surgery with A SIP OF WATER   rosuvastatin  (CRESTOR )   May take these medicines IF NEEDED: gabapentin  (NEURONTIN )  fluticasone  (FLONASE )  naloxone (NARCAN)  omeprazole  (PRILOSEC)  PERCOCET GLYCOLAX /MIRALAX  tiZANidine  (ZANAFLEX )    One week prior to surgery, STOP taking anyAleve, Naproxen, Ibuprofen, Motrin, Advil, Goody's, BC's, all herbal medications, fish oil, and non-prescription vitamins. THIS INCLUDES YOUR diclofenac  Sodium (VOLTAREN ) 1 % GEL lidocaine  (LIDODERM ) 5 ,          FOLLOW INSTRUCTIONS GIVEN TO YOU BY  YOUR   SURGEON REGARDING ASPIRIN , IF NO INSTRUCTIONS REACH OUT TO OFFICE.   WHAT DO I DO ABOUT MY DIABETES MEDICATION?   Do not take oral diabetes medicines metFORMIN  (GLUCOPHAGE )  the morning of surgery.   HOW TO MANAGE YOUR DIABETES BEFORE AND AFTER SURGERY  Why is it important to control my blood sugar before and after surgery? Improving blood sugar levels before and after surgery helps healing and can limit problems. A way of improving blood sugar control is eating a healthy diet by:  Eating less sugar and carbohydrates  Increasing activity/exercise  Talking with your doctor about reaching your blood sugar goals High blood sugars (greater than 180 mg/dL) can raise your risk of infections and slow your recovery, so you will need to focus on controlling your diabetes during the weeks before surgery. Make sure that the doctor who takes care of your diabetes knows about your planned surgery including the date and location.  How do I manage my blood sugar before surgery? Check your blood sugar at least 4 times a day, starting 2 days before surgery, to make sure that the level is not too high or low.  Check your blood sugar the morning of your surgery when you wake up and every 2 hours until you get to the Short Stay unit.  If your blood sugar is less than 70 mg/dL, you will need to treat for low blood sugar: Do not take insulin . Treat a low blood sugar (less than 70 mg/dL) with  cup  of clear juice (cranberry or apple), 4 glucose tablets, OR glucose gel. Recheck blood sugar in 15 minutes after treatment (to make sure it is greater than 70 mg/dL). If your blood sugar is not greater than 70 mg/dL on recheck, call [phone] for further instructions. Report your blood sugar to the short stay nurse when you get to Short Stay.  If you are admitted to the hospital after surgery: Your blood sugar will be checked by the staff and you will probably be given insulin  after surgery (instead of  oral diabetes medicines) to make sure you have good blood sugar levels. The goal for blood sugar control after surgery is 80-180 mg/dL.              Do NOT Smoke (Tobacco/Vaping) for 24 hours prior to your procedure.  If you use a CPAP at night, you may bring your mask/headgear for your overnight stay.   You will be asked to remove any contacts, glasses, piercing's, hearing aid's, dentures/partials prior to surgery. Please bring cases for these items if needed.    Patients discharged the day of surgery will not be allowed to drive home, and someone needs to stay with them for 24 hours.  SURGICAL WAITING ROOM VISITATION Patients may have no more than 2 support people in the waiting area - these visitors may rotate.   Pre-op nurse will coordinate an appropriate time for 1 ADULT support person, who may not rotate, to accompany patient in pre-op.  Children under the age of 80 must have an adult with them who is not the patient and must remain in the main waiting area with an adult.  If the patient needs to stay at the hospital during part of their recovery, the visitor guidelines for inpatient rooms apply.  Please refer to the [facility] website for the visitor guidelines for any additional information.   If you received a COVID test during your pre-op visit  it is requested that you wear a mask when out in public, stay away from anyone that may not be feeling well and notify your surgeon if you develop symptoms. If you have been in contact with anyone that has tested positive in the last 10 days please notify you surgeon.      Pre-operative 5 CHG Bathing Instructions   You can play a key role in reducing the risk of infection after surgery. Your skin needs to be as free of germs as possible. You can reduce the number of germs on your skin by washing with CHG (chlorhexidine  gluconate) soap before surgery. CHG is an antiseptic soap that kills germs and continues to kill germs even after  washing.   DO NOT use if you have an allergy to chlorhexidine /CHG or antibacterial soaps. If your skin becomes reddened or irritated, stop using the CHG and notify one of our RNs at [phone].   Please shower with the CHG soap starting 4 days before surgery using the following schedule:     Please keep in mind the following:  DO NOT shave, including legs and underarms, starting the day of your first shower.   You may shave your face at any point before/day of surgery.  Place clean sheets on your bed the day you start using CHG soap. Use a clean washcloth (not used since being washed) for each shower. DO NOT sleep with pets once you start using the CHG.   CHG Shower Instructions:  Wash your face and private area with normal soap. If  you choose to wash your hair, wash first with your normal shampoo.  After you use shampoo/soap, rinse your hair and body thoroughly to remove shampoo/soap residue.  Turn the water  OFF and apply about 3 tablespoons (45 ml) of CHG soap to a CLEAN washcloth.  Apply CHG soap ONLY FROM YOUR NECK DOWN TO YOUR TOES (washing for 3-5 minutes)  DO NOT use CHG soap on face, private areas, open wounds, or sores.  Pay special attention to the area where your surgery is being performed.  If you are having back surgery, having someone wash your back for you may be helpful. Wait 2 minutes after CHG soap is applied, then you may rinse off the CHG soap.  Pat dry with a clean towel  Put on clean clothes/pajamas   If you choose to wear lotion, please use ONLY the CHG-compatible lotions that are listed below.  Additional instructions for the day of surgery: DO NOT APPLY any lotions, deodorants, cologne, or perfumes.   Do not bring valuables to the hospital. [facility] is not responsible for any belongings/valuables. Do not wear nail polish, gel polish, artificial nails, or any other type of covering on natural nails (fingers and toes) Do not wear jewelry or makeup Put on  clean/comfortable clothes.  Please brush your teeth.  Ask your nurse before applying any prescription medications to the skin.     CHG Compatible Lotions   Aveeno Moisturizing lotion  Cetaphil Moisturizing Cream  Cetaphil Moisturizing Lotion  Clairol Herbal Essence Moisturizing Lotion, Dry Skin  Clairol Herbal Essence Moisturizing Lotion, Extra Dry Skin  Clairol Herbal Essence Moisturizing Lotion, Normal Skin  Curel Age Defying Therapeutic Moisturizing Lotion with Alpha Hydroxy  Curel Extreme Care Body Lotion  Curel Soothing Hands Moisturizing Hand Lotion  Curel Therapeutic Moisturizing Cream, Fragrance-Free  Curel Therapeutic Moisturizing Lotion, Fragrance-Free  Curel Therapeutic Moisturizing Lotion, Original Formula  Eucerin Daily Replenishing Lotion  Eucerin Dry Skin Therapy Plus Alpha Hydroxy Crme  Eucerin Dry Skin Therapy Plus Alpha Hydroxy Lotion  Eucerin Original Crme  Eucerin Original Lotion  Eucerin Plus Crme Eucerin Plus Lotion  Eucerin TriLipid Replenishing Lotion  Keri Anti-Bacterial Hand Lotion  Keri Deep Conditioning Original Lotion Dry Skin Formula Softly Scented  Keri Deep Conditioning Original Lotion, Fragrance Free Sensitive Skin Formula  Keri Lotion Fast Absorbing Fragrance Free Sensitive Skin Formula  Keri Lotion Fast Absorbing Softly Scented Dry Skin Formula  Keri Original Lotion  Keri Skin Renewal Lotion Keri Silky Smooth Lotion  Keri Silky Smooth Sensitive Skin Lotion  Nivea Body Creamy Conditioning Oil  Nivea Body Extra Enriched Lotion  Nivea Body Original Lotion  Nivea Body Sheer Moisturizing Lotion Nivea Crme  Nivea Skin Firming Lotion  NutraDerm 30 Skin Lotion  NutraDerm Skin Lotion  NutraDerm Therapeutic Skin Cream  NutraDerm Therapeutic Skin Lotion  ProShield Protective Hand Cream  Provon moisturizing lotion  Please read over the following fact sheets that you were given.

## 2024-05-14 ENCOUNTER — Encounter (HOSPITAL_COMMUNITY): Admission: RE | Payer: Self-pay | Source: Home / Self Care

## 2024-05-14 ENCOUNTER — Ambulatory Visit (HOSPITAL_COMMUNITY): Admission: RE | Admit: 2024-05-14 | Source: Home / Self Care | Admitting: Orthopedic Surgery

## 2024-05-14 SURGERY — POSTERIOR LUMBAR FUSION 1 LEVEL
Anesthesia: General

## 2024-05-21 ENCOUNTER — Other Ambulatory Visit: Payer: Self-pay | Admitting: Orthopedic Surgery

## 2024-06-02 NOTE — Anesthesia Preprocedure Evaluation (Signed)
 Anesthesia Evaluation  Patient identified by MRN, date of birth, ID band Patient awake    Reviewed: Allergy & Precautions, NPO status , Patient's Chart, lab work & pertinent test results  Airway Mallampati: I  TM Distance: >3 FB Neck ROM: Full    Dental no notable dental hx.    Pulmonary former smoker   Pulmonary exam normal        Cardiovascular hypertension, Pt. on medications Normal cardiovascular exam  ECHO: 1. Left ventricular ejection fraction, by estimation, is 60 to 65%. The  left ventricle has normal function. The left ventricle has no regional  wall motion abnormalities. Left ventricular diastolic parameters were  normal.   2. Right ventricular systolic function is normal. The right ventricular  size is normal.   3. The mitral valve is normal in structure. No evidence of mitral valve  regurgitation. No evidence of mitral stenosis.   4. The aortic valve is tricuspid. Aortic valve regurgitation is not  visualized. Aortic valve sclerosis/calcification is present, without any  evidence of aortic stenosis.   5. The inferior vena cava is normal in size with greater than 50%  respiratory variability, suggesting right atrial pressure of 3 mmHg.     Neuro/Psych  PSYCHIATRIC DISORDERS Anxiety Depression     Neuromuscular disease    GI/Hepatic Neg liver ROS, hiatal hernia,GERD  Medicated and Controlled,,  Endo/Other  diabetes, Oral Hypoglycemic Agents    Renal/GU Renal disease     Musculoskeletal  (+) Arthritis ,    Abdominal   Peds  Hematology negative hematology ROS (+)   Anesthesia Other Findings NEUROFORAMINAL STENOSIS  Reproductive/Obstetrics                             Anesthesia Physical Anesthesia Plan  ASA: 3  Anesthesia Plan: General   Post-op Pain Management:    Induction: Intravenous  PONV Risk Score and Plan: 2 and Ondansetron , Dexamethasone  and Treatment may vary  due to age or medical condition  Airway Management Planned: Oral ETT  Additional Equipment:   Intra-op Plan:   Post-operative Plan: Extubation in OR  Informed Consent: I have reviewed the patients History and Physical, chart, labs and discussed the procedure including the risks, benefits and alternatives for the proposed anesthesia with the patient or authorized representative who has indicated his/her understanding and acceptance.     Dental advisory given  Plan Discussed with: CRNA  Anesthesia Plan Comments: (PAT note from 6/24)        Anesthesia Quick Evaluation

## 2024-06-02 NOTE — Progress Notes (Signed)
 Attempted to reach patient to review pre-op instructions with no success.  Instructions left on voicemail.
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
## 2024-06-02 NOTE — Progress Notes (Signed)
 Patient stated that he has his instructions from his previous PAT appointment and has had no changes since then.  He states he has been taking the CHG showers as instructed.  His fasting glucose is between 115-120.  Patient aware to arrive at 0930 and to finish his G2 by 0930.  Patient did not have any further questions.

## 2024-06-03 ENCOUNTER — Ambulatory Visit (HOSPITAL_COMMUNITY)

## 2024-06-03 ENCOUNTER — Ambulatory Visit (HOSPITAL_BASED_OUTPATIENT_CLINIC_OR_DEPARTMENT_OTHER): Admitting: Medical

## 2024-06-03 ENCOUNTER — Encounter (HOSPITAL_COMMUNITY)
Admission: RE | Disposition: A | Discharge status: Home / Self Care | Payer: Self-pay | Source: Home / Self Care | Attending: Orthopedic Surgery

## 2024-06-03 ENCOUNTER — Encounter (HOSPITAL_COMMUNITY): Payer: Self-pay | Admitting: Orthopedic Surgery

## 2024-06-03 ENCOUNTER — Other Ambulatory Visit: Payer: Self-pay

## 2024-06-03 ENCOUNTER — Observation Stay (HOSPITAL_COMMUNITY)
Admission: RE | Admit: 2024-06-03 | Discharge: 2024-06-04 | Disposition: A | Discharge status: Home / Self Care | Attending: Orthopedic Surgery | Admitting: Orthopedic Surgery

## 2024-06-03 ENCOUNTER — Ambulatory Visit (HOSPITAL_COMMUNITY): Admitting: Medical

## 2024-06-03 DIAGNOSIS — Z96612 Presence of left artificial shoulder joint: Secondary | ICD-10-CM | POA: Insufficient documentation

## 2024-06-03 DIAGNOSIS — Z79899 Other long term (current) drug therapy: Secondary | ICD-10-CM | POA: Diagnosis not present

## 2024-06-03 DIAGNOSIS — S32058K Other fracture of fifth lumbar vertebra, subsequent encounter for fracture with nonunion: Secondary | ICD-10-CM | POA: Insufficient documentation

## 2024-06-03 DIAGNOSIS — X58XXXA Exposure to other specified factors, initial encounter: Secondary | ICD-10-CM | POA: Insufficient documentation

## 2024-06-03 DIAGNOSIS — M4807 Spinal stenosis, lumbosacral region: Secondary | ICD-10-CM | POA: Diagnosis not present

## 2024-06-03 DIAGNOSIS — Z87891 Personal history of nicotine dependence: Secondary | ICD-10-CM | POA: Insufficient documentation

## 2024-06-03 DIAGNOSIS — M960 Pseudarthrosis after fusion or arthrodesis: Secondary | ICD-10-CM | POA: Insufficient documentation

## 2024-06-03 DIAGNOSIS — E119 Type 2 diabetes mellitus without complications: Secondary | ICD-10-CM | POA: Diagnosis not present

## 2024-06-03 DIAGNOSIS — M48061 Spinal stenosis, lumbar region without neurogenic claudication: Secondary | ICD-10-CM

## 2024-06-03 DIAGNOSIS — Z7982 Long term (current) use of aspirin: Secondary | ICD-10-CM | POA: Insufficient documentation

## 2024-06-03 DIAGNOSIS — Z96651 Presence of right artificial knee joint: Secondary | ICD-10-CM | POA: Insufficient documentation

## 2024-06-03 DIAGNOSIS — F418 Other specified anxiety disorders: Secondary | ICD-10-CM

## 2024-06-03 DIAGNOSIS — S32009K Unspecified fracture of unspecified lumbar vertebra, subsequent encounter for fracture with nonunion: Principal | ICD-10-CM | POA: Diagnosis present

## 2024-06-03 DIAGNOSIS — Z7984 Long term (current) use of oral hypoglycemic drugs: Secondary | ICD-10-CM | POA: Diagnosis not present

## 2024-06-03 DIAGNOSIS — I10 Essential (primary) hypertension: Secondary | ICD-10-CM | POA: Diagnosis not present

## 2024-06-03 LAB — CBC
HCT: 37.8 % — ABNORMAL LOW (ref 39.0–52.0)
Hemoglobin: 12.5 g/dL — ABNORMAL LOW (ref 13.0–17.0)
MCH: 27.7 pg (ref 26.0–34.0)
MCHC: 33.1 g/dL (ref 30.0–36.0)
MCV: 83.6 fL (ref 80.0–100.0)
Platelets: 341 10*3/uL (ref 150–400)
RBC: 4.52 MIL/uL (ref 4.22–5.81)
RDW: 14.2 % (ref 11.5–15.5)
WBC: 6.2 10*3/uL (ref 4.0–10.5)
nRBC: 0 % (ref 0.0–0.2)

## 2024-06-03 LAB — GLUCOSE, CAPILLARY
Glucose-Capillary: 179 mg/dL — ABNORMAL HIGH (ref 70–99)
Glucose-Capillary: 137 mg/dL — ABNORMAL HIGH (ref 70–99)
Glucose-Capillary: 179 mg/dL — ABNORMAL HIGH (ref 70–99)
Glucose-Capillary: 193 mg/dL — ABNORMAL HIGH (ref 70–99)
Glucose-Capillary: 224 mg/dL — ABNORMAL HIGH (ref 70–99)

## 2024-06-03 LAB — BASIC METABOLIC PANEL WITH GFR
Anion gap: 9 (ref 5–15)
BUN: 16 mg/dL (ref 8–23)
CO2: 25 mmol/L (ref 22–32)
Calcium: 9.8 mg/dL (ref 8.9–10.3)
Chloride: 99 mmol/L (ref 98–111)
Creatinine, Ser: 1.17 mg/dL (ref 0.61–1.24)
GFR, Estimated: >60 mL/min (ref >60)
Glucose, Bld: 162 mg/dL — ABNORMAL HIGH (ref 70–99)
Potassium: 3.6 mmol/L (ref 3.5–5.1)
Sodium: 133 mmol/L — ABNORMAL LOW (ref 135–145)

## 2024-06-03 LAB — TYPE AND SCREEN
ABO/RH(D): B POS
Antibody Screen: NEGATIVE

## 2024-06-03 SURGERY — POSTERIOR LUMBAR FUSION 1 LEVEL
Anesthesia: General

## 2024-06-03 MED ORDER — ROSUVASTATIN CALCIUM 20 MG PO TABS
20.0000 mg | ORAL_TABLET | Freq: Every day | ORAL | Status: DC
  Administered 2024-06-03 – 2024-06-04 (×2): 20 mg via ORAL
  Filled 2024-06-03 (×2): qty 1

## 2024-06-03 MED ORDER — MENTHOL 3 MG MT LOZG: 1.0000 | LOZENGE | OROMUCOSAL | Status: DC | PRN

## 2024-06-03 MED ORDER — CHLORHEXIDINE GLUCONATE 0.12 % MT SOLN
15.0000 mL | Freq: Once | OROMUCOSAL | Status: AC
  Administered 2024-06-03: 15 mL via OROMUCOSAL
  Filled 2024-06-03: qty 15

## 2024-06-03 MED ORDER — AMISULPRIDE (ANTIEMETIC) 5 MG/2ML IV SOLN: 10.0000 mg | Freq: Once | INTRAVENOUS | Status: DC | PRN

## 2024-06-03 MED ORDER — OXYCODONE-ACETAMINOPHEN 5-325 MG PO TABS
1.0000 | ORAL_TABLET | ORAL | Status: DC | PRN
  Administered 2024-06-03 – 2024-06-04 (×4): 2 via ORAL
  Filled 2024-06-03 (×4): qty 2

## 2024-06-03 MED ORDER — PHENYLEPHRINE 80 MCG/ML (10ML) SYRINGE FOR IV PUSH (FOR BLOOD PRESSURE SUPPORT)
PREFILLED_SYRINGE | INTRAVENOUS | Status: AC
  Filled 2024-06-03: qty 10

## 2024-06-03 MED ORDER — BISACODYL 5 MG PO TBEC: 5.0000 mg | DELAYED_RELEASE_TABLET | Freq: Every day | ORAL | Status: DC | PRN

## 2024-06-03 MED ORDER — PHENOL 1.4 % MT LIQD: 1.0000 | OROMUCOSAL | Status: DC | PRN

## 2024-06-03 MED ORDER — BUPIVACAINE-EPINEPHRINE 0.25% -1:200000 IJ SOLN
INTRAMUSCULAR | Status: DC | PRN
  Administered 2024-06-03: 10 mL
  Administered 2024-06-03: 20 mL

## 2024-06-03 MED ORDER — PROPOFOL 10 MG/ML IV BOLUS
INTRAVENOUS | Status: DC | PRN
  Administered 2024-06-03: 200 mg via INTRAVENOUS

## 2024-06-03 MED ORDER — CEFAZOLIN SODIUM-DEXTROSE 2-4 GM/100ML-% IV SOLN
2.0000 g | Freq: Three times a day (TID) | INTRAVENOUS | Status: AC
  Administered 2024-06-03 – 2024-06-04 (×2): 2 g via INTRAVENOUS
  Filled 2024-06-03 (×2): qty 100

## 2024-06-03 MED ORDER — ONDANSETRON HCL 4 MG PO TABS: 4.0000 mg | ORAL_TABLET | Freq: Four times a day (QID) | ORAL | Status: DC | PRN

## 2024-06-03 MED ORDER — HYDROCHLOROTHIAZIDE 25 MG PO TABS
25.0000 mg | ORAL_TABLET | Freq: Every day | ORAL | Status: DC
  Administered 2024-06-04: 25 mg via ORAL
  Filled 2024-06-03: qty 1

## 2024-06-03 MED ORDER — LOSARTAN POTASSIUM-HCTZ 100-25 MG PO TABS: 1.0000 | ORAL_TABLET | Freq: Every day | ORAL | Status: DC

## 2024-06-03 MED ORDER — LIDOCAINE 2% (20 MG/ML) 5 ML SYRINGE
INTRAMUSCULAR | Status: AC
  Filled 2024-06-03: qty 5

## 2024-06-03 MED ORDER — PHENYLEPHRINE 80 MCG/ML (10ML) SYRINGE FOR IV PUSH (FOR BLOOD PRESSURE SUPPORT)
PREFILLED_SYRINGE | INTRAVENOUS | Status: DC | PRN
Start: 2024-06-03 — End: 2024-06-03
  Administered 2024-06-03: 80 ug via INTRAVENOUS
  Administered 2024-06-03: 160 ug via INTRAVENOUS
  Administered 2024-06-03 (×2): 80 ug via INTRAVENOUS
  Administered 2024-06-03 (×2): 160 ug via INTRAVENOUS
  Administered 2024-06-03: 80 ug via INTRAVENOUS
  Administered 2024-06-03: 160 ug via INTRAVENOUS
  Administered 2024-06-03 (×2): 80 ug via INTRAVENOUS

## 2024-06-03 MED ORDER — EPHEDRINE SULFATE-NACL 50-0.9 MG/10ML-% IV SOSY
PREFILLED_SYRINGE | INTRAVENOUS | Status: DC | PRN
  Administered 2024-06-03 (×5): 5 mg via INTRAVENOUS

## 2024-06-03 MED ORDER — FENTANYL CITRATE (PF) 100 MCG/2ML IJ SOLN
INTRAMUSCULAR | Status: AC
  Filled 2024-06-03: qty 2

## 2024-06-03 MED ORDER — PROPOFOL 10 MG/ML IV BOLUS
INTRAVENOUS | Status: AC
  Filled 2024-06-03: qty 20

## 2024-06-03 MED ORDER — BUPIVACAINE LIPOSOME 1.3 % IJ SUSP
INTRAMUSCULAR | Status: AC
  Filled 2024-06-03: qty 20

## 2024-06-03 MED ORDER — MORPHINE SULFATE (PF) 2 MG/ML IV SOLN: 1.0000 mg | INTRAVENOUS | Status: DC | PRN

## 2024-06-03 MED ORDER — LIDOCAINE 2% (20 MG/ML) 5 ML SYRINGE
INTRAMUSCULAR | Status: DC | PRN
  Administered 2024-06-03: 60 mg via INTRAVENOUS

## 2024-06-03 MED ORDER — THROMBIN 20000 UNITS EX SOLR
CUTANEOUS | Status: AC
  Filled 2024-06-03: qty 20000

## 2024-06-03 MED ORDER — FENTANYL CITRATE (PF) 100 MCG/2ML IJ SOLN
50.0000 ug | INTRAMUSCULAR | Status: AC | PRN
  Administered 2024-06-03 (×2): 50 ug via INTRAVENOUS
  Filled 2024-06-03 (×2): qty 2

## 2024-06-03 MED ORDER — POLYETHYLENE GLYCOL 3350 17 G PO PACK: 17.0000 g | PACK | Freq: Every day | ORAL | Status: DC | PRN

## 2024-06-03 MED ORDER — ONDANSETRON HCL 4 MG/2ML IJ SOLN: 4.0000 mg | Freq: Four times a day (QID) | INTRAMUSCULAR | Status: DC | PRN

## 2024-06-03 MED ORDER — CEFAZOLIN SODIUM-DEXTROSE 2-4 GM/100ML-% IV SOLN
2.0000 g | INTRAVENOUS | Status: AC
  Administered 2024-06-03: 2 g via INTRAVENOUS
  Filled 2024-06-03: qty 100

## 2024-06-03 MED ORDER — ORAL CARE MOUTH RINSE: 15.0000 mL | Freq: Once | OROMUCOSAL | Status: AC

## 2024-06-03 MED ORDER — ALUM & MAG HYDROXIDE-SIMETH 200-200-20 MG/5ML PO SUSP
30.0000 mL | Freq: Four times a day (QID) | ORAL | Status: DC | PRN
Start: 2024-06-03 — End: 2024-06-04

## 2024-06-03 MED ORDER — SUGAMMADEX SODIUM 200 MG/2ML IV SOLN
INTRAVENOUS | Status: DC | PRN
  Administered 2024-06-03: 200 mg via INTRAVENOUS
  Administered 2024-06-03: 100 mg via INTRAVENOUS

## 2024-06-03 MED ORDER — INSULIN ASPART 100 UNIT/ML IJ SOLN
0.0000 [IU] | Freq: Every day | INTRAMUSCULAR | Status: DC
  Administered 2024-06-03: 2 [IU] via SUBCUTANEOUS

## 2024-06-03 MED ORDER — METFORMIN HCL 500 MG PO TABS
1000.0000 mg | ORAL_TABLET | Freq: Two times a day (BID) | ORAL | Status: DC
Start: 2024-06-04 — End: 2024-06-04
  Administered 2024-06-04: 1000 mg via ORAL
  Filled 2024-06-03: qty 2

## 2024-06-03 MED ORDER — INSULIN ASPART 100 UNIT/ML IJ SOLN: 0.0000 [IU] | INTRAMUSCULAR | Status: DC | PRN

## 2024-06-03 MED ORDER — ONDANSETRON HCL 4 MG/2ML IJ SOLN: 4.0000 mg | Freq: Once | INTRAMUSCULAR | Status: DC | PRN

## 2024-06-03 MED ORDER — SODIUM CHLORIDE 0.9 % IV SOLN: 250.0000 mL | INTRAVENOUS | Status: DC

## 2024-06-03 MED ORDER — POVIDONE-IODINE 7.5 % EX SOLN: Freq: Once | CUTANEOUS | Status: DC

## 2024-06-03 MED ORDER — SODIUM CHLORIDE 0.9% FLUSH: 3.0000 mL | Freq: Two times a day (BID) | INTRAVENOUS | Status: DC

## 2024-06-03 MED ORDER — ACETAMINOPHEN 650 MG RE SUPP: 650.0000 mg | RECTAL | Status: DC | PRN

## 2024-06-03 MED ORDER — SODIUM CHLORIDE 0.9% FLUSH: 3.0000 mL | INTRAVENOUS | Status: DC | PRN

## 2024-06-03 MED ORDER — GABAPENTIN 300 MG PO CAPS
600.0000 mg | ORAL_CAPSULE | Freq: Three times a day (TID) | ORAL | Status: DC | PRN
  Administered 2024-06-03: 600 mg via ORAL
  Filled 2024-06-03: qty 2

## 2024-06-03 MED ORDER — NALOXONE HCL 4 MG/0.1ML NA LIQD: 1.0000 | Freq: Once | NASAL | Status: DC | PRN

## 2024-06-03 MED ORDER — 0.9 % SODIUM CHLORIDE (POUR BTL) OPTIME
TOPICAL | Status: DC | PRN
  Administered 2024-06-03: 1000 mL

## 2024-06-03 MED ORDER — METHOCARBAMOL 500 MG PO TABS
500.0000 mg | ORAL_TABLET | Freq: Four times a day (QID) | ORAL | Status: DC | PRN
  Administered 2024-06-03 – 2024-06-04 (×2): 500 mg via ORAL
  Filled 2024-06-03 (×2): qty 1

## 2024-06-03 MED ORDER — PHENYLEPHRINE HCL-NACL 20-0.9 MG/250ML-% IV SOLN: INTRAVENOUS | Status: DC | PRN

## 2024-06-03 MED ORDER — BUPIVACAINE-EPINEPHRINE (PF) 0.25% -1:200000 IJ SOLN
INTRAMUSCULAR | Status: AC
  Filled 2024-06-03: qty 30

## 2024-06-03 MED ORDER — ROCURONIUM BROMIDE 10 MG/ML (PF) SYRINGE
PREFILLED_SYRINGE | INTRAVENOUS | Status: AC
Start: 2024-06-03 — End: 2024-06-03
  Filled 2024-06-03: qty 10

## 2024-06-03 MED ORDER — ACETAMINOPHEN 500 MG PO TABS
1000.0000 mg | ORAL_TABLET | Freq: Once | ORAL | Status: AC
  Administered 2024-06-03: 1000 mg via ORAL
  Filled 2024-06-03: qty 2

## 2024-06-03 MED ORDER — BUPIVACAINE LIPOSOME 1.3 % IJ SUSP
INTRAMUSCULAR | Status: DC | PRN
  Administered 2024-06-03: 20 mL

## 2024-06-03 MED ORDER — ZOLPIDEM TARTRATE 5 MG PO TABS: 5.0000 mg | ORAL_TABLET | Freq: Every evening | ORAL | Status: DC | PRN

## 2024-06-03 MED ORDER — ACETAMINOPHEN 325 MG PO TABS: 650.0000 mg | ORAL_TABLET | ORAL | Status: DC | PRN

## 2024-06-03 MED ORDER — PHENYLEPHRINE HCL-NACL 20-0.9 MG/250ML-% IV SOLN
INTRAVENOUS | Status: DC | PRN
  Administered 2024-06-03: 40 ug/min via INTRAVENOUS

## 2024-06-03 MED ORDER — LACTATED RINGERS IV SOLN: INTRAVENOUS | Status: DC

## 2024-06-03 MED ORDER — INSULIN ASPART 100 UNIT/ML IJ SOLN
0.0000 [IU] | Freq: Three times a day (TID) | INTRAMUSCULAR | Status: DC
  Administered 2024-06-04: 3 [IU] via SUBCUTANEOUS

## 2024-06-03 MED ORDER — ONDANSETRON HCL 4 MG/2ML IJ SOLN
INTRAMUSCULAR | Status: AC
  Filled 2024-06-03: qty 2

## 2024-06-03 MED ORDER — LOSARTAN POTASSIUM 50 MG PO TABS
100.0000 mg | ORAL_TABLET | Freq: Every day | ORAL | Status: DC
  Administered 2024-06-04: 100 mg via ORAL
  Filled 2024-06-03: qty 2

## 2024-06-03 MED ORDER — DEXAMETHASONE SODIUM PHOSPHATE 10 MG/ML IJ SOLN
INTRAMUSCULAR | Status: DC | PRN
  Administered 2024-06-03: 5 mg via INTRAVENOUS

## 2024-06-03 MED ORDER — DEXAMETHASONE SODIUM PHOSPHATE 10 MG/ML IJ SOLN
INTRAMUSCULAR | Status: AC
  Filled 2024-06-03: qty 1

## 2024-06-03 MED ORDER — PANTOPRAZOLE SODIUM 40 MG PO TBEC: 40.0000 mg | DELAYED_RELEASE_TABLET | Freq: Every day | ORAL | Status: DC | PRN

## 2024-06-03 MED ORDER — HYDROCODONE-ACETAMINOPHEN 5-325 MG PO TABS: 1.0000 | ORAL_TABLET | ORAL | Status: DC | PRN

## 2024-06-03 MED ORDER — FLEET ENEMA RE ENEM: 1.0000 | ENEMA | Freq: Once | RECTAL | Status: DC | PRN

## 2024-06-03 MED ORDER — FENTANYL CITRATE (PF) 250 MCG/5ML IJ SOLN
INTRAMUSCULAR | Status: AC
  Filled 2024-06-03: qty 5

## 2024-06-03 MED ORDER — ALBUMIN HUMAN 5 % IV SOLN: INTRAVENOUS | Status: DC | PRN

## 2024-06-03 MED ORDER — METHOCARBAMOL 1000 MG/10ML IJ SOLN: 500.0000 mg | Freq: Four times a day (QID) | INTRAMUSCULAR | Status: DC | PRN

## 2024-06-03 MED ORDER — THROMBIN 20000 UNITS EX SOLR
CUTANEOUS | Status: DC | PRN
  Administered 2024-06-03: 20 mL

## 2024-06-03 MED ORDER — FLUTICASONE PROPIONATE 50 MCG/ACT NA SUSP: 2.0000 | Freq: Every day | NASAL | Status: DC | PRN

## 2024-06-03 MED ORDER — ONDANSETRON HCL 4 MG/2ML IJ SOLN
INTRAMUSCULAR | Status: DC | PRN
  Administered 2024-06-03: 4 mg via INTRAVENOUS

## 2024-06-03 MED ORDER — FENTANYL CITRATE (PF) 250 MCG/5ML IJ SOLN
INTRAMUSCULAR | Status: DC | PRN
Start: 2024-06-03 — End: 2024-06-03
  Administered 2024-06-03: 150 ug via INTRAVENOUS

## 2024-06-03 MED ORDER — FENTANYL CITRATE (PF) 100 MCG/2ML IJ SOLN
25.0000 ug | INTRAMUSCULAR | Status: DC | PRN
  Administered 2024-06-03 (×2): 50 ug via INTRAVENOUS

## 2024-06-03 MED ORDER — VITAMIN D (ERGOCALCIFEROL) 1.25 MG (50000 UNIT) PO CAPS
50000.0000 [IU] | ORAL_CAPSULE | ORAL | Status: DC
  Administered 2024-06-04: 50000 [IU] via ORAL
  Filled 2024-06-03: qty 1

## 2024-06-03 MED ORDER — EPHEDRINE 5 MG/ML INJ
INTRAVENOUS | Status: AC
  Filled 2024-06-03: qty 5

## 2024-06-03 MED ORDER — DOCUSATE SODIUM 100 MG PO CAPS
100.0000 mg | ORAL_CAPSULE | Freq: Two times a day (BID) | ORAL | Status: DC
  Administered 2024-06-03 – 2024-06-04 (×2): 100 mg via ORAL
  Filled 2024-06-03 (×2): qty 1

## 2024-06-03 MED ORDER — ROCURONIUM BROMIDE 10 MG/ML (PF) SYRINGE
PREFILLED_SYRINGE | INTRAVENOUS | Status: DC | PRN
  Administered 2024-06-03: 20 mg via INTRAVENOUS
  Administered 2024-06-03: 60 mg via INTRAVENOUS

## 2024-06-03 SURGICAL SUPPLY — 74 items
BAG COUNTER SPONGE SURGICOUNT (BAG) ×2 IMPLANT
BENZOIN TINCTURE PRP APPL 2/3 (GAUZE/BANDAGES/DRESSINGS) ×2 IMPLANT
BLADE CLIPPER SURG (BLADE) IMPLANT
BUR PRESCISION 1.7 ELITE (BURR) ×2 IMPLANT
BUR ROUND FLUTED 5 RND (BURR) ×2 IMPLANT
BUR ROUND PRECISION 4.0 (BURR) IMPLANT
BUR SABER RD CUTTING 3.0 (BURR) IMPLANT
CNTNR URN SCR LID CUP LEK RST (MISCELLANEOUS) ×2 IMPLANT
COVER MAYO STAND STRL (DRAPES) ×4 IMPLANT
COVER SURGICAL LIGHT HANDLE (MISCELLANEOUS) ×2 IMPLANT
DRAPE C-ARM 42X72 X-RAY (DRAPES) ×2 IMPLANT
DRAPE C-ARMOR (DRAPES) IMPLANT
DRAPE POUCH INSTRU U-SHP 10X18 (DRAPES) ×2 IMPLANT
DRAPE SURG 17X23 STRL (DRAPES) ×8 IMPLANT
DURAPREP 26ML APPLICATOR (WOUND CARE) ×2 IMPLANT
ELECT CAUTERY BLADE 6.4 (BLADE) ×2 IMPLANT
ELECTRODE BLDE 4.0 EZ CLN MEGD (MISCELLANEOUS) ×2 IMPLANT
ELECTRODE REM PT RTRN 9FT ADLT (ELECTROSURGICAL) ×2 IMPLANT
EVACUATOR SILICONE 100CC (DRAIN) IMPLANT
FIBER BONE PLIAFX PAK 10CC (Bone Implant) ×1 IMPLANT
FILTER STRAW FLUID ASPIR (MISCELLANEOUS) ×2 IMPLANT
GAUZE 4X4 16PLY ~~LOC~~+RFID DBL (SPONGE) ×2 IMPLANT
GAUZE SPONGE 4X4 12PLY STRL (GAUZE/BANDAGES/DRESSINGS) ×2 IMPLANT
GLOVE BIO SURGEON STRL SZ 6.5 (GLOVE) ×2 IMPLANT
GLOVE BIO SURGEON STRL SZ8 (GLOVE) ×2 IMPLANT
GLOVE BIOGEL PI IND STRL 7.0 (GLOVE) ×2 IMPLANT
GLOVE BIOGEL PI IND STRL 8 (GLOVE) ×2 IMPLANT
GLOVE SURG ENC MOIS LTX SZ6.5 (GLOVE) ×2 IMPLANT
GOWN STRL REUS W/ TWL LRG LVL3 (GOWN DISPOSABLE) ×4 IMPLANT
GOWN STRL REUS W/ TWL XL LVL3 (GOWN DISPOSABLE) ×2 IMPLANT
GRAFT BNE FBR PLIAFX PAK 10 (Bone Implant) IMPLANT
IV CATH 14GX2 1/4 (CATHETERS) ×2 IMPLANT
KIT BASIN OR (CUSTOM PROCEDURE TRAY) ×2 IMPLANT
KIT INFUSE X SMALL 1.4CC (Orthopedic Implant) IMPLANT
KIT POSITIONER JACKSON TABLE (MISCELLANEOUS) ×2 IMPLANT
KIT TURNOVER KIT B (KITS) ×2 IMPLANT
MARKER SKIN DUAL TIP RULER LAB (MISCELLANEOUS) ×4 IMPLANT
NDL 18GX1X1/2 (RX/OR ONLY) (NEEDLE) ×2 IMPLANT
NDL 22X1.5 STRL (OR ONLY) (MISCELLANEOUS) ×4 IMPLANT
NDL HYPO 25GX1X1/2 BEV (NEEDLE) ×2 IMPLANT
NDL SPNL 18GX3.5 QUINCKE PK (NEEDLE) ×4 IMPLANT
NEEDLE 18GX1X1/2 (RX/OR ONLY) (NEEDLE) ×1 IMPLANT
NEEDLE 22X1.5 STRL (OR ONLY) (MISCELLANEOUS) ×2 IMPLANT
NEEDLE HYPO 25GX1X1/2 BEV (NEEDLE) ×1 IMPLANT
NEEDLE SPNL 18GX3.5 QUINCKE PK (NEEDLE) ×2 IMPLANT
NS IRRIG 1000ML POUR BTL (IV SOLUTION) ×2 IMPLANT
PACK LAMINECTOMY ORTHO (CUSTOM PROCEDURE TRAY) ×2 IMPLANT
PACK UNIVERSAL I (CUSTOM PROCEDURE TRAY) ×2 IMPLANT
PAD ARMBOARD POSITIONER FOAM (MISCELLANEOUS) ×4 IMPLANT
PATTIES SURGICAL .5 X1 (DISPOSABLE) ×2 IMPLANT
PATTIES SURGICAL .5X1.5 (GAUZE/BANDAGES/DRESSINGS) ×2 IMPLANT
ROD PRE BENT EXPEDIUM 35MM (Rod) IMPLANT
SCREW SET SINGLE INNER (Screw) IMPLANT
SCREW VIPER 8X40MM (Screw) IMPLANT
SCREW XTAB POLY VIPER 7X45 (Screw) IMPLANT
SPONGE INTESTINAL PEANUT (DISPOSABLE) ×2 IMPLANT
SPONGE SURGIFOAM ABS GEL 100 (HEMOSTASIS) ×2 IMPLANT
STRIP CLOSURE SKIN 1/2X4 (GAUZE/BANDAGES/DRESSINGS) ×4 IMPLANT
SURGIFLO W/THROMBIN 8M KIT (HEMOSTASIS) IMPLANT
SUT MNCRL AB 4-0 PS2 18 (SUTURE) ×2 IMPLANT
SUT VIC AB 0 CT1 18XCR BRD 8 (SUTURE) ×2 IMPLANT
SUT VIC AB 1 CT1 18XCR BRD 8 (SUTURE) ×2 IMPLANT
SUT VIC AB 2-0 CT2 18 VCP726D (SUTURE) ×2 IMPLANT
SYR 20ML LL LF (SYRINGE) ×4 IMPLANT
SYR BULB IRRIG 60ML STRL (SYRINGE) ×2 IMPLANT
SYR CONTROL 10ML LL (SYRINGE) ×4 IMPLANT
SYR TB 1ML LUER SLIP (SYRINGE) ×2 IMPLANT
TAP CANN VIPER2 8 DUEL LEAD (TAP) IMPLANT
TAP CANN VIPER2 DL 5.0 (TAP) IMPLANT
TAP CANN VIPER2 DL 6.0 (TAP) IMPLANT
TAP CANN VIPER2 DL 7.0 (TAP) IMPLANT
TRAY FOLEY MTR SLVR 16FR STAT (SET/KITS/TRAYS/PACK) ×2 IMPLANT
WATER STERILE IRR 1000ML POUR (IV SOLUTION) ×2 IMPLANT
YANKAUER SUCT BULB TIP NO VENT (SUCTIONS) ×2 IMPLANT

## 2024-06-03 NOTE — Op Note (Signed)
 PATIENT NAME: Brian Dominguez   MEDICAL RECORD NO.:   [id number]    DATE OF BIRTH: 10/04/1946   DATE OF PROCEDURE: 06/03/2024                               OPERATIVE REPORT     PREOPERATIVE DIAGNOSES: 1.  L5-S1 nonunion 2.  L5-S1 spinal stenosis   POSTOPERATIVE DIAGNOSES:   1.  L5-S1 nonunion 2.  L5-S1 spinal stenosis     PROCEDURE: 1. Posterior spinal fusion, L5-S1 2. Placement of posterior instrumentation, L5, S1 bilaterally. 3. L5-S1 decompression 4. Use of morselized allograft - Pliafix PAK and bmp. 5. Intraoperative use of floroscopy   SURGEON:  Oneil Priestly, MD   ASSISTANT:  Ileana Clara, PA-C   ANESTHESIA:  General endotracheal anesthesia.   COMPLICATIONS:  None.   DISPOSITION:  Stable.   ESTIMATED BLOOD LOSS:  Minimal   INDICATIONS FOR SURGERY: Briefly, patient is status post an anterior lumbar fusion many years ago, in 2016.  A CAT scan subsequent to this did reveal a nonunion.  The patient did live with his pain for years, but he did get to a point where he felt as though his pain was very significant.  I did feel that his pain was likely primarily secondary to his nonunion, in addition to his stenosis at L5-S1, and we did discuss proceeding with a decompression and revision posterior fusion with instrumentation and allograft, at L5-S1.  He did wish to proceed.    OPERATIVE DETAILS:  On 06/03/2024, the patient was brought to surgery and general endotracheal anesthesia was administered.  The patient was placed prone onto a [name] spinal bed.  The back was then prepped and draped in the usual sterile fashion.  I then made paramedian incisions on the right and left sides, just lateral to the lateral borders of the pedicles at L5 and S1.  Bilaterally, the L5-S1 facet joints were identified and subperiosteally exposed, as were the bilateral L5 transverse processes and bilateral sacral ala.  I did use a high-speed bur to remove prominent hypertrophy of the bilateral  L5-S1 facet joints.  Then, using a Kerrison, working from laterally to medially, I did perform a bilateral neuroforaminal decompression, undercutting the L5-S1 facet joints, and decompressing the bilateral exiting L5 nerves.  At this point, I turned my attention toward the instrumentation portion of the procedure.  Using AP and lateral fluoroscopy, I did identify starting points at the L5 and S1 pedicles.  I then used a high-speed bur, followed by a gearshift probe, angled laterally to medially.  Then, at L5, I did tap up to a 6 mm tap, and a 7 mm tap was used at S1, bilaterally.  Bone wax was placed into the cannulated pedicle holes.  At this point, I turned my attention toward the fusion portion of the procedure.  I did use a high-speed bur to decorticate the L5-S1 facet joints, the bilateral L5 transverse processes, and the bilateral sacral ala.  A collagen sponge soaked with BMP was placed along the posterolateral gutters bilaterally.  I then placed pliafix allograft over the BMP sponges, to help aid in the success of the fusion.  At this point, 7 x 45 mm screws were placed bilaterally at L5, an 8 x 40 mm screws were placed bilaterally at S1.  35 mm rods were secured into the tulip heads of the screws.  Caps were placed and a final locking  procedure was performed.  I was pleased with the final AP and lateral fluoroscopic images.  All bleeding was controlled at the termination of the procedure.  On the right and left sides, the fascia was closed using #1 Vicryl.  The subcutaneous layer was closed using 0 Vicryl followed by 2-0 Vicryl, and the skin was then closed using 4-0 Monocryl. Benzoin and Steri-Strips were applied followed by sterile dressing.  All instrument counts were correct at the termination of the procedure.    Of note, Ileana Clara was my assistant throughout surgery, and did aid in retraction, suctioning, and closure for the entire procedure.     Oneil Priestly, MD

## 2024-06-03 NOTE — Transfer of Care (Signed)
 Immediate Anesthesia Transfer of Care Note  Patient: Brian Dominguez  Procedure(s) Performed: POSTERIOR LUMBAR FUSION 1 LEVEL  Patient Location: PACU  Anesthesia Type:General  Level of Consciousness: awake, alert , and oriented  Airway & Oxygen  Therapy: Patient Spontanous Breathing  Post-op Assessment: Report given to RN, Post -op Vital signs reviewed and stable, and Patient moving all extremities X 4  Post vital signs: Reviewed and stable  Last Vitals:  Vitals Value Taken Time  BP 125/58 06/03/24 15:40  Temp    Pulse 96 06/03/24 15:41  Resp 24 06/03/24 15:41  SpO2 98 % 06/03/24 15:41  Vitals shown include unfiled device data.  Last Pain:  Vitals:   06/03/24 1217  TempSrc:   PainSc: 3       Patients Stated Pain Goal: 0 (06/03/24 1217)  Complications: No notable events documented.

## 2024-06-03 NOTE — Anesthesia Procedure Notes (Signed)
 Procedure Name: Intubation Date/Time: 06/03/2024 12:49 PM  Performed by: Jolynn Mage, CRNAPre-anesthesia Checklist: Patient identified, Patient being monitored, Timeout performed, Emergency Drugs available and Suction available Patient Re-evaluated:Patient Re-evaluated prior to induction Oxygen  Delivery Method: Circle System Utilized Preoxygenation: Pre-oxygenation with 100% oxygen  Induction Type: IV induction Ventilation: Two handed mask ventilation required and Oral airway inserted - appropriate to patient size Laryngoscope Size: Cleotilde and 3 Grade View: Grade I Tube type: Oral Tube size: 7.5 mm Number of attempts: 1 Airway Equipment and Method: Stylet Placement Confirmation: ETT inserted through vocal cords under direct vision, positive ETCO2 and breath sounds checked- equal and bilateral Secured at: 23 cm Tube secured with: Tape Dental Injury: Teeth and Oropharynx as per pre-operative assessment
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
## 2024-06-04 DIAGNOSIS — M4807 Spinal stenosis, lumbosacral region: Secondary | ICD-10-CM | POA: Diagnosis not present

## 2024-06-04 LAB — GLUCOSE, CAPILLARY: Glucose-Capillary: 172 mg/dL — ABNORMAL HIGH (ref 70–99)

## 2024-06-04 MED ORDER — OXYCODONE-ACETAMINOPHEN 5-325 MG PO TABS: 1.0000 | ORAL_TABLET | ORAL | 0 refills | Status: DC | PRN

## 2024-06-04 MED ORDER — METHOCARBAMOL 500 MG PO TABS: 500.0000 mg | ORAL_TABLET | Freq: Four times a day (QID) | ORAL | 2 refills | Status: DC | PRN

## 2024-06-04 NOTE — Plan of Care (Signed)
 Pt doing well. Pt given D/C instructions with verbal understanding. Rx's were sent to the pharmacy by MD. Pt's incision is clean and dry with no sign of infection. Pt's IV was removed prior to D/C. Pt received 3-n-1 from [facility] per MD order. Pt D/C'd home via wheelchair per MD order. Pt is stable @ D/C and has no other needs at this time. Cab voucher was given to the Pt @ D/C and [employer] cab was called for the Pt. Rosina Rakers, RN

## 2024-06-04 NOTE — Progress Notes (Signed)
    Patient doing well  Patient notes expected postoperative pain Patient does feel a difference in his preoperative pain   Physical Exam: Vitals:   06/03/24 2307 06/04/24 0420  BP: 124/66 118/82  Pulse: 87 84  Resp: 18 18  Temp: 97.7 F (36.5 C) 97.8 F (36.6 C)  SpO2: 100% 100%    Dressing in place NVI  POD #1 s/p revision posterior fusion at L5-S1, doing well  - up with PT/OT, encourage ambulation - Percocet for pain, Robaxin  for muscle spasms - likely d/c home today with f/u in 2 weeks -Patient had requested home health physical therapy, which I do feel is reasonable.  We will plan on getting this arranged.

## 2024-06-04 NOTE — TOC Transition Note (Signed)
 Transition of Care [facility] [place]) - Discharge Note   Patient Details  Name: Brian Dominguez MRN: [id number] Date of Birth: 01/30/1946  Transition of Care [facility]) CM/SW Contact:  Rosalva Jon Bloch, RN Phone Number: 06/04/2024, 11:02 AM   Clinical Narrative:    Patient will DC to: home  Anticipated DC date: 06/04/2024 Transport by: car   Per MD patient ready for DC today.RN, patient aware of DC. Pt agreeable to home health services. Pt without provider preference. Referral made with [facility] and accepted. Pt with out DME need.  Pt states has no way home. Can't afford  taxi . NCM provided pt  taxi voucher. Pt without RX med concerns.  Post hospital f/u noted on AVS.  RNCM will sign off for now as intervention is no longer needed. Please consult us  again if new needs arise.   Final next level of care: Home w Home Health Services Barriers to Discharge: No Barriers Identified   Patient Goals and CMS Choice     Choice offered to / list presented to : Patient      Discharge Placement                       Discharge Plan and Services Additional resources added to the After Visit Summary for                            [facility] Arranged: PT, OT [facility] Agency: [facility] Date [place] Agency Contacted: 06/04/24 Time HH Agency Contacted: 1101 Representative spoke with at [facility] Agency: Arna  Social Drivers of Health (SDOH) Interventions SDOH Screenings   Food Insecurity: No Food Insecurity (04/27/2024)  Housing: Low Risk  (04/27/2024)  Transportation Needs: No Transportation Needs (04/27/2024)  Utilities: Not At Risk (04/27/2024)  Depression (PHQ2-9): Low Risk  (08/01/2020)  Social Connections: Moderately Isolated (04/27/2024)  Tobacco Use: Medium Risk (06/03/2024)     Readmission Risk Interventions     No data to display
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
## 2024-06-04 NOTE — Care Management Obs Status (Signed)
 MEDICARE OBSERVATION STATUS NOTIFICATION   Patient Details  Name: Brian Dominguez MRN: [id number] Date of Birth: June 05, 1946   Medicare Observation Status Notification Given:  Yes    Jon Cruel 06/04/2024, 9:21 AM

## 2024-06-04 NOTE — Care Management Obs Status (Signed)
 MEDICARE OBSERVATION STATUS NOTIFICATION   Patient Details  Name: Brian Dominguez MRN: [id number] Date of Birth: [birth year]   Medicare Observation Status Notification Given:  Yes    Jon Cruel 06/04/2024, 9:35 AM
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
## 2024-06-04 NOTE — Anesthesia Postprocedure Evaluation (Signed)
 Anesthesia Post Note  Patient: Brian Dominguez  Procedure(s) Performed: POSTERIOR LUMBAR FUSION 1 LEVEL     Patient location during evaluation: PACU Anesthesia Type: General Level of consciousness: awake Pain management: pain level controlled Vital Signs Assessment: post-procedure vital signs reviewed and stable Respiratory status: spontaneous breathing, nonlabored ventilation and respiratory function stable Cardiovascular status: blood pressure returned to baseline and stable Postop Assessment: no apparent nausea or vomiting Anesthetic complications: no   No notable events documented.  Last Vitals:  Vitals:   06/03/24 2307 06/04/24 0420  BP: 124/66 118/82  Pulse: 87 84  Resp: 18 18  Temp: 36.5 C 36.6 C  SpO2: 100% 100%    Last Pain:  Vitals:   06/04/24 0527  TempSrc:   PainSc: 7                  [name]
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
## 2024-08-21 ENCOUNTER — Other Ambulatory Visit: Payer: Self-pay | Admitting: Urology

## 2024-08-21 DIAGNOSIS — R972 Elevated prostate specific antigen [PSA]: Secondary | ICD-10-CM

## 2024-09-21 ENCOUNTER — Emergency Department (HOSPITAL_COMMUNITY): Admission: EM | Admit: 2024-09-21 | Discharge: 2024-09-21 | Disposition: A | Discharge status: Home / Self Care

## 2024-09-21 ENCOUNTER — Other Ambulatory Visit: Payer: Self-pay

## 2024-09-21 ENCOUNTER — Encounter (HOSPITAL_COMMUNITY): Payer: Self-pay

## 2024-09-21 ENCOUNTER — Emergency Department (HOSPITAL_COMMUNITY)

## 2024-09-21 DIAGNOSIS — K5900 Constipation, unspecified: Secondary | ICD-10-CM | POA: Insufficient documentation

## 2024-09-21 DIAGNOSIS — R10A1 Flank pain, right side: Secondary | ICD-10-CM

## 2024-09-21 DIAGNOSIS — R109 Unspecified abdominal pain: Secondary | ICD-10-CM

## 2024-09-21 DIAGNOSIS — R110 Nausea: Secondary | ICD-10-CM | POA: Insufficient documentation

## 2024-09-21 LAB — URINALYSIS, ROUTINE W REFLEX MICROSCOPIC
Bacteria, UA: NONE SEEN
Bilirubin Urine: NEGATIVE
Glucose, UA: NEGATIVE mg/dL
Ketones, ur: NEGATIVE mg/dL
Leukocytes,Ua: NEGATIVE
Nitrite: NEGATIVE
Protein, ur: NEGATIVE mg/dL
RBC / HPF: >50 RBC/hpf (ref 0–5)
Specific Gravity, Urine: 1.019 (ref 1.005–1.030)
pH: 6 (ref 5.0–8.0)

## 2024-09-21 LAB — COMPREHENSIVE METABOLIC PANEL WITH GFR
ALT: 12 U/L (ref 0–44)
AST: 20 U/L (ref 15–41)
Albumin: 3.7 g/dL (ref 3.5–5.0)
Alkaline Phosphatase: 60 U/L (ref 38–126)
Anion gap: 12 (ref 5–15)
BUN: 15 mg/dL (ref 8–23)
CO2: 24 mmol/L (ref 22–32)
Calcium: 9.6 mg/dL (ref 8.9–10.3)
Chloride: 101 mmol/L (ref 98–111)
Creatinine, Ser: 1.13 mg/dL (ref 0.61–1.24)
GFR, Estimated: >60 mL/min (ref >60)
Glucose, Bld: 164 mg/dL — ABNORMAL HIGH (ref 70–99)
Potassium: 4 mmol/L (ref 3.5–5.1)
Sodium: 137 mmol/L (ref 135–145)
Total Bilirubin: 0.5 mg/dL (ref 0.0–1.2)
Total Protein: 8.7 g/dL — ABNORMAL HIGH (ref 6.5–8.1)

## 2024-09-21 LAB — CBC
HCT: 37.8 % — ABNORMAL LOW (ref 39.0–52.0)
Hemoglobin: 11.8 g/dL — ABNORMAL LOW (ref 13.0–17.0)
MCH: 24.4 pg — ABNORMAL LOW (ref 26.0–34.0)
MCHC: 31.2 g/dL (ref 30.0–36.0)
MCV: 78.1 fL — ABNORMAL LOW (ref 80.0–100.0)
Platelets: 647 K/uL — ABNORMAL HIGH (ref 150–400)
RBC: 4.84 MIL/uL (ref 4.22–5.81)
RDW: 15.4 % (ref 11.5–15.5)
WBC: 9.6 K/uL (ref 4.0–10.5)
nRBC: 0 % (ref 0.0–0.2)

## 2024-09-21 LAB — I-STAT CHEM 8, ED
BUN: 17 mg/dL (ref 8–23)
Calcium, Ion: 1.14 mmol/L — ABNORMAL LOW (ref 1.15–1.40)
Chloride: 100 mmol/L (ref 98–111)
Creatinine, Ser: 1.3 mg/dL — ABNORMAL HIGH (ref 0.61–1.24)
Glucose, Bld: 145 mg/dL — ABNORMAL HIGH (ref 70–99)
HCT: 34 % — ABNORMAL LOW (ref 39.0–52.0)
Hemoglobin: 11.6 g/dL — ABNORMAL LOW (ref 13.0–17.0)
Potassium: 4.3 mmol/L (ref 3.5–5.1)
Sodium: 140 mmol/L (ref 135–145)
TCO2: 28 mmol/L (ref 22–32)

## 2024-09-21 LAB — LIPASE, BLOOD: Lipase: 55 U/L — ABNORMAL HIGH (ref 11–51)

## 2024-09-21 MED ORDER — METHOCARBAMOL 500 MG PO TABS: 500.0000 mg | ORAL_TABLET | Freq: Four times a day (QID) | ORAL | 0 refills | Status: AC | PRN

## 2024-09-21 MED ORDER — OXYCODONE-ACETAMINOPHEN 5-325 MG PO TABS
1.0000 | ORAL_TABLET | Freq: Once | ORAL | Status: AC
Start: 2024-09-21 — End: 2024-09-21
  Administered 2024-09-21: 1 via ORAL
  Filled 2024-09-21: qty 1

## 2024-09-21 MED ORDER — SODIUM CHLORIDE 0.9 % IV BOLUS
500.0000 mL | Freq: Once | INTRAVENOUS | Status: AC
  Administered 2024-09-21: 500 mL via INTRAVENOUS

## 2024-09-21 MED ORDER — ONDANSETRON HCL 4 MG/2ML IJ SOLN
4.0000 mg | Freq: Once | INTRAMUSCULAR | Status: AC
  Administered 2024-09-21: 4 mg via INTRAVENOUS
  Filled 2024-09-21: qty 2

## 2024-09-21 MED ORDER — KETOROLAC TROMETHAMINE 15 MG/ML IJ SOLN
15.0000 mg | Freq: Once | INTRAMUSCULAR | Status: AC
  Administered 2024-09-21: 15 mg via INTRAVENOUS
  Filled 2024-09-21: qty 1

## 2024-09-21 MED ORDER — POLYETHYLENE GLYCOL 3350 17 G PO PACK: 17.0000 g | PACK | Freq: Every day | ORAL | 0 refills | Status: AC

## 2024-09-21 NOTE — ED Triage Notes (Signed)
 Pt c/o abdominal pain that started approximately a wk ago. Pt denies nausea, vomiting, diarrhea or constipation.

## 2024-09-21 NOTE — ED Notes (Signed)
 Patient transported to CT

## 2024-09-21 NOTE — Discharge Instructions (Signed)
 Take your Robaxin  up to 4 times a day as needed for pain.  You should also take your muscle relaxer daily as needed for constipation.  Call and follow-up with your primary care doctor.

## 2024-09-21 NOTE — ED Provider Notes (Signed)
 [place] EMERGENCY DEPARTMENT AT [facility] Provider Note   CSN: [id number] Arrival date & time: 09/21/24  1011     Patient presents with: Abdominal Pain   Brian Dominguez is a 78 y.o. male.  {Add pertinent medical, surgical, social history, OB history to HPI:175} 79 year old male presents for evaluation of abdominal pain.  States it is on the right side and radiates to his right flank.  States has been going on for a week.  Admits to some nausea but denies any vomiting or urinary symptoms.  Denies any other symptoms or concerns.   Abdominal Pain Associated symptoms: nausea   Associated symptoms: no chest pain, no chills, no cough, no dysuria, no fever, no hematuria, no shortness of breath, no sore throat and no vomiting        Prior to Admission medications   Medication Sig Start Date End Date Taking? Authorizing Provider  aspirin  EC 81 MG tablet Take 1 tablet (81 mg total) by mouth daily. Swallow whole. 04/28/24 04/28/25  Elicia Hamlet, MD  diclofenac  Sodium (VOLTAREN ) 1 % GEL Apply 1 Application topically 4 (four) times daily as needed (pain).    [provider]  fluticasone  (FLONASE ) 50 MCG/ACT nasal spray Place 2 sprays into both nostrils daily as needed for allergies. 10/29/22   [provider]  gabapentin  (NEURONTIN ) 600 MG tablet Take 600 mg by mouth 3 (three) times daily as needed (leg pain).    [provider]  lidocaine  (LIDODERM ) 5 % Place 1 patch onto the skin daily. Remove & Discard patch within 12 hours or as directed by MD 04/28/24   Elicia Hamlet, MD  losartan -hydrochlorothiazide  (HYZAAR) 100-25 MG tablet Take 1 tablet by mouth daily. 04/07/24   [provider]  metFORMIN  (GLUCOPHAGE ) 1000 MG tablet TAKE 1 TABLET (1,000 MG TOTAL) BY MOUTH 2 (TWO) TIMES DAILY WITH A MEAL. 05/29/21   Cresenzo, Kallen V, MD  methocarbamol  (ROBAXIN ) 500 MG tablet Take 1 tablet (500 mg total) by mouth every 6 (six) hours as needed for muscle  spasms. 06/04/24   Beuford Anes, MD  naloxone  (NARCAN ) nasal spray 4 mg/0.1 mL Place 1 spray into the nose once as needed (OD).    [provider]  omeprazole  (PRILOSEC) 40 MG capsule Take 40 mg by mouth 2 (two) times daily as needed (acid reflux).    [provider]  oxyCODONE -acetaminophen  (PERCOCET) 10-325 MG tablet Take 1 tablet by mouth 4 (four) times daily as needed for pain. 03/31/24   [provider]  oxyCODONE -acetaminophen  (PERCOCET/ROXICET) 5-325 MG tablet Take 1-2 tablets by mouth every 4 (four) hours as needed for severe pain (pain score 7-10). 06/04/24   Beuford Anes, MD  polyethylene glycol powder (GLYCOLAX /MIRALAX ) 17 GM/SCOOP powder Take 17 g by mouth daily as needed for mild constipation. 04/28/24   Elicia Hamlet, MD  rosuvastatin  (CRESTOR ) 20 MG tablet Take 1 tablet (20 mg total) by mouth daily. 04/29/24   Elicia Hamlet, MD  tiZANidine  (ZANAFLEX ) 4 MG tablet Take 4 mg by mouth every 8 (eight) hours as needed for muscle spasms. 10/31/22   [provider]  Vitamin D , Ergocalciferol , (DRISDOL ) 1.25 MG (50000 UNIT) CAPS capsule Take 50,000 Units by mouth once a week. Thursdays 02/26/24   [provider]    Allergies: Enalapril    Review of Systems  Constitutional:  Negative for chills and fever.  HENT:  Negative for ear pain and sore throat.   Eyes:  Negative for pain and visual disturbance.  Respiratory:  Negative for cough and shortness of breath.   Cardiovascular:  Negative for chest pain and palpitations.  Gastrointestinal:  Positive for abdominal pain and nausea. Negative for vomiting.  Genitourinary:  Negative for dysuria and hematuria.  Musculoskeletal:  Negative for arthralgias and back pain.  Skin:  Negative for color change and rash.  Neurological:  Negative for seizures and syncope.  All other systems reviewed and are negative.   Updated Vital Signs BP 136/73 (BP Location: Right Arm)   Pulse (!) 110   Temp 98.3 F (36.8  C) (Oral)   Resp 16   Ht 6' 2 (1.88 m)   Wt 92.5 kg   SpO2 98%   BMI 26.19 kg/m   Physical Exam Vitals and nursing note reviewed.  Constitutional:      General: He is not in acute distress.    Appearance: He is well-developed. He is not ill-appearing.  HENT:     Head: Normocephalic and atraumatic.  Eyes:     Conjunctiva/sclera: Conjunctivae normal.  Cardiovascular:     Rate and Rhythm: Normal rate and regular rhythm.     Heart sounds: No murmur heard. Pulmonary:     Effort: Pulmonary effort is normal. No respiratory distress.     Breath sounds: Normal breath sounds.  Abdominal:     Palpations: Abdomen is soft.     Tenderness: There is abdominal tenderness in the right lower quadrant. There is right CVA tenderness.  Musculoskeletal:        General: No swelling.     Cervical back: Neck supple.  Skin:    General: Skin is warm and dry.     Capillary Refill: Capillary refill takes less than 2 seconds.  Neurological:     Mental Status: He is alert.  Psychiatric:        Mood and Affect: Mood normal.     (all labs ordered are listed, but only abnormal results are displayed) Labs Reviewed  LIPASE, BLOOD  COMPREHENSIVE METABOLIC PANEL WITH GFR  CBC  URINALYSIS, ROUTINE W REFLEX MICROSCOPIC  I-STAT CHEM 8, ED    EKG: EKG Interpretation Date/Time:  Monday September 21 2024 10:45:16 EDT Ventricular Rate:  110 PR Interval:  148 QRS Duration:  80 QT Interval:  316 QTC Calculation: 427 R Axis:   23  Text Interpretation: Sinus tachycardia with Premature atrial complexes Nonspecific ST and T wave abnormality Compared with prior EKG from 04/26/2024 Confirmed by Gennaro Bouchard ([id number]) on 09/21/2024 11:14:23 AM  Radiology: No results found.  {Document cardiac monitor, telemetry assessment procedure when appropriate:32947} Procedures   Medications Ordered in the ED  ondansetron  (ZOFRAN ) injection 4 mg (has no administration in time range)  sodium chloride  0.9 % bolus 500  mL (has no administration in time range)  ketorolac  (TORADOL ) 15 MG/ML injection 15 mg (has no administration in time range)      {Click here for ABCD2, HEART and other calculators REFRESH Note before signing:1}                              Medical Decision Making Amount and/or Complexity of Data Reviewed Labs: ordered. Radiology: ordered.  Risk Prescription drug management.   ***  {Document critical care time when appropriate  Document review of labs and clinical decision tools ie CHADS2VASC2, etc  Document your independent review of radiology images and any outside records  Document your discussion with family members, caretakers and with consultants  Document  social determinants of health affecting pt's care  Document your decision making why or why not admission, treatments were needed:32947:::1}   Final diagnoses:  None    ED Discharge Orders     None

## 2024-10-02 ENCOUNTER — Ambulatory Visit
Admission: RE | Admit: 2024-10-02 | Discharge: 2024-10-02 | Disposition: A | Discharge status: Home / Self Care | Source: Ambulatory Visit | Attending: Urology | Admitting: Urology

## 2024-10-02 DIAGNOSIS — R972 Elevated prostate specific antigen [PSA]: Secondary | ICD-10-CM

## 2024-10-02 MED ORDER — GADOPICLENOL 0.5 MMOL/ML IV SOLN
10.0000 mL | Freq: Once | INTRAVENOUS | Status: AC | PRN
  Administered 2024-10-02: 10 mL via INTRAVENOUS

## 2024-10-23 ENCOUNTER — Other Ambulatory Visit: Payer: Self-pay

## 2024-10-23 ENCOUNTER — Emergency Department (HOSPITAL_COMMUNITY)

## 2024-10-23 ENCOUNTER — Inpatient Hospital Stay (HOSPITAL_COMMUNITY)
Admission: EM | Admit: 2024-10-23 | Discharge: 2024-10-27 | DRG: 377 | Disposition: A | Discharge status: Home / Self Care | Attending: Emergency Medicine | Admitting: Emergency Medicine

## 2024-10-23 ENCOUNTER — Encounter (HOSPITAL_COMMUNITY): Payer: Self-pay | Admitting: Emergency Medicine

## 2024-10-23 DIAGNOSIS — Z981 Arthrodesis status: Secondary | ICD-10-CM

## 2024-10-23 DIAGNOSIS — K5521 Angiodysplasia of colon with hemorrhage: Principal | ICD-10-CM | POA: Diagnosis present

## 2024-10-23 DIAGNOSIS — Z720 Tobacco use: Secondary | ICD-10-CM | POA: Diagnosis not present

## 2024-10-23 DIAGNOSIS — K5520 Angiodysplasia of colon without hemorrhage: Principal | ICD-10-CM | POA: Diagnosis present

## 2024-10-23 DIAGNOSIS — Z888 Allergy status to other drugs, medicaments and biological substances status: Secondary | ICD-10-CM

## 2024-10-23 DIAGNOSIS — K838 Other specified diseases of biliary tract: Secondary | ICD-10-CM | POA: Diagnosis present

## 2024-10-23 DIAGNOSIS — K921 Melena: Secondary | ICD-10-CM | POA: Diagnosis present

## 2024-10-23 DIAGNOSIS — K2990 Gastroduodenitis, unspecified, without bleeding: Secondary | ICD-10-CM | POA: Diagnosis present

## 2024-10-23 DIAGNOSIS — Z7982 Long term (current) use of aspirin: Secondary | ICD-10-CM

## 2024-10-23 DIAGNOSIS — Z7984 Long term (current) use of oral hypoglycemic drugs: Secondary | ICD-10-CM

## 2024-10-23 DIAGNOSIS — K2970 Gastritis, unspecified, without bleeding: Secondary | ICD-10-CM | POA: Diagnosis present

## 2024-10-23 DIAGNOSIS — Z8249 Family history of ischemic heart disease and other diseases of the circulatory system: Secondary | ICD-10-CM | POA: Diagnosis not present

## 2024-10-23 DIAGNOSIS — Z96651 Presence of right artificial knee joint: Secondary | ICD-10-CM | POA: Diagnosis present

## 2024-10-23 DIAGNOSIS — Z79899 Other long term (current) drug therapy: Secondary | ICD-10-CM

## 2024-10-23 DIAGNOSIS — K219 Gastro-esophageal reflux disease without esophagitis: Secondary | ICD-10-CM | POA: Diagnosis present

## 2024-10-23 DIAGNOSIS — R109 Unspecified abdominal pain: Secondary | ICD-10-CM | POA: Diagnosis not present

## 2024-10-23 DIAGNOSIS — K859 Acute pancreatitis without necrosis or infection, unspecified: Secondary | ICD-10-CM | POA: Diagnosis present

## 2024-10-23 DIAGNOSIS — G8929 Other chronic pain: Secondary | ICD-10-CM | POA: Diagnosis present

## 2024-10-23 DIAGNOSIS — E119 Type 2 diabetes mellitus without complications: Secondary | ICD-10-CM | POA: Diagnosis present

## 2024-10-23 DIAGNOSIS — K922 Gastrointestinal hemorrhage, unspecified: Secondary | ICD-10-CM | POA: Diagnosis not present

## 2024-10-23 DIAGNOSIS — Z9049 Acquired absence of other specified parts of digestive tract: Secondary | ICD-10-CM | POA: Diagnosis not present

## 2024-10-23 DIAGNOSIS — D509 Iron deficiency anemia, unspecified: Secondary | ICD-10-CM | POA: Diagnosis present

## 2024-10-23 DIAGNOSIS — F419 Anxiety disorder, unspecified: Secondary | ICD-10-CM | POA: Diagnosis not present

## 2024-10-23 DIAGNOSIS — N411 Chronic prostatitis: Secondary | ICD-10-CM | POA: Diagnosis present

## 2024-10-23 DIAGNOSIS — K5730 Diverticulosis of large intestine without perforation or abscess without bleeding: Secondary | ICD-10-CM | POA: Diagnosis present

## 2024-10-23 DIAGNOSIS — I10 Essential (primary) hypertension: Secondary | ICD-10-CM | POA: Diagnosis present

## 2024-10-23 DIAGNOSIS — Z87891 Personal history of nicotine dependence: Secondary | ICD-10-CM | POA: Diagnosis not present

## 2024-10-23 DIAGNOSIS — Z96612 Presence of left artificial shoulder joint: Secondary | ICD-10-CM | POA: Diagnosis present

## 2024-10-23 LAB — COMPREHENSIVE METABOLIC PANEL WITH GFR
ALT: 13 U/L (ref 0–44)
AST: 19 U/L (ref 15–41)
Albumin: 3.3 g/dL — ABNORMAL LOW (ref 3.5–5.0)
Alkaline Phosphatase: 48 U/L (ref 38–126)
Anion gap: 10 (ref 5–15)
BUN: 18 mg/dL (ref 8–23)
CO2: 26 mmol/L (ref 22–32)
Calcium: 9.3 mg/dL (ref 8.9–10.3)
Chloride: 98 mmol/L (ref 98–111)
Creatinine, Ser: 1.16 mg/dL (ref 0.61–1.24)
GFR, Estimated: >60 mL/min (ref >60)
Glucose, Bld: 140 mg/dL — ABNORMAL HIGH (ref 70–99)
Potassium: 4.1 mmol/L (ref 3.5–5.1)
Sodium: 134 mmol/L — ABNORMAL LOW (ref 135–145)
Total Bilirubin: 0.8 mg/dL (ref 0.0–1.2)
Total Protein: 7.9 g/dL (ref 6.5–8.1)

## 2024-10-23 LAB — URINALYSIS, ROUTINE W REFLEX MICROSCOPIC
Bilirubin Urine: NEGATIVE
Glucose, UA: NEGATIVE mg/dL
Hgb urine dipstick: NEGATIVE
Ketones, ur: NEGATIVE mg/dL
Leukocytes,Ua: NEGATIVE
Nitrite: NEGATIVE
Protein, ur: NEGATIVE mg/dL
Specific Gravity, Urine: 1.019 (ref 1.005–1.030)
pH: 6 (ref 5.0–8.0)

## 2024-10-23 LAB — RETICULOCYTES
Immature Retic Fract: 15.2 % (ref 2.3–15.9)
RBC.: 4.09 MIL/uL — ABNORMAL LOW (ref 4.22–5.81)
Retic Count, Absolute: 32.7 K/uL (ref 19.0–186.0)
Retic Ct Pct: 0.8 % (ref 0.4–3.1)

## 2024-10-23 LAB — MRSA NEXT GEN BY PCR, NASAL: MRSA by PCR Next Gen: NOT DETECTED

## 2024-10-23 LAB — VITAMIN B12: Vitamin B-12: <150 pg/mL — ABNORMAL LOW (ref 180–914)

## 2024-10-23 LAB — CBC
HCT: 35.3 % — ABNORMAL LOW (ref 39.0–52.0)
Hemoglobin: 11.4 g/dL — ABNORMAL LOW (ref 13.0–17.0)
MCH: 25.3 pg — ABNORMAL LOW (ref 26.0–34.0)
MCHC: 32.3 g/dL (ref 30.0–36.0)
MCV: 78.3 fL — ABNORMAL LOW (ref 80.0–100.0)
Platelets: 365 K/uL (ref 150–400)
RBC: 4.51 MIL/uL (ref 4.22–5.81)
RDW: 17.7 % — ABNORMAL HIGH (ref 11.5–15.5)
WBC: 5.6 K/uL (ref 4.0–10.5)
nRBC: 0 % (ref 0.0–0.2)

## 2024-10-23 LAB — ETHANOL: Alcohol, Ethyl (B): <15 mg/dL (ref <15)

## 2024-10-23 LAB — TYPE AND SCREEN
ABO/RH(D): B POS
Antibody Screen: NEGATIVE

## 2024-10-23 LAB — IRON AND TIBC
Iron: 44 ug/dL — ABNORMAL LOW (ref 45–182)
Saturation Ratios: 18 % (ref 17.9–39.5)
TIBC: 251 ug/dL (ref 250–450)
UIBC: 207 ug/dL

## 2024-10-23 LAB — GAMMA GT: GGT: 43 U/L (ref 7–50)

## 2024-10-23 LAB — GLUCOSE, CAPILLARY
Glucose-Capillary: 99 mg/dL (ref 70–99)
Glucose-Capillary: 127 mg/dL — ABNORMAL HIGH (ref 70–99)

## 2024-10-23 LAB — FOLATE: Folate: 6 ng/mL (ref >5.9)

## 2024-10-23 LAB — FERRITIN: Ferritin: 100 ng/mL (ref 24–336)

## 2024-10-23 LAB — LIPASE, BLOOD: Lipase: 274 U/L — ABNORMAL HIGH (ref 11–51)

## 2024-10-23 MED ORDER — ONDANSETRON HCL 4 MG/2ML IJ SOLN
4.0000 mg | Freq: Once | INTRAMUSCULAR | Status: AC
  Administered 2024-10-23: 4 mg via INTRAVENOUS
  Filled 2024-10-23: qty 2

## 2024-10-23 MED ORDER — ONDANSETRON HCL 4 MG PO TABS: 4.0000 mg | ORAL_TABLET | Freq: Four times a day (QID) | ORAL | Status: DC | PRN

## 2024-10-23 MED ORDER — PANTOPRAZOLE SODIUM 40 MG IV SOLR
40.0000 mg | Freq: Two times a day (BID) | INTRAVENOUS | Status: DC
  Administered 2024-10-23 – 2024-10-27 (×9): 40 mg via INTRAVENOUS
  Filled 2024-10-23 (×9): qty 10

## 2024-10-23 MED ORDER — ONDANSETRON HCL 4 MG/2ML IJ SOLN
4.0000 mg | Freq: Four times a day (QID) | INTRAMUSCULAR | Status: DC | PRN
  Administered 2024-10-24: 4 mg via INTRAVENOUS
  Filled 2024-10-23: qty 2

## 2024-10-23 MED ORDER — INSULIN ASPART 100 UNIT/ML IJ SOLN
0.0000 [IU] | Freq: Three times a day (TID) | INTRAMUSCULAR | Status: DC
  Administered 2024-10-24 – 2024-10-25 (×2): 2 [IU] via SUBCUTANEOUS
  Administered 2024-10-25: 3 [IU] via SUBCUTANEOUS
  Administered 2024-10-26: 5 [IU] via SUBCUTANEOUS
  Administered 2024-10-26 – 2024-10-27 (×2): 2 [IU] via SUBCUTANEOUS
  Administered 2024-10-27: 3 [IU] via SUBCUTANEOUS
  Filled 2024-10-23: qty 2
  Filled 2024-10-23: qty 5
  Filled 2024-10-23: qty 1
  Filled 2024-10-23: qty 2
  Filled 2024-10-23: qty 3
  Filled 2024-10-23: qty 2

## 2024-10-23 MED ORDER — LORAZEPAM 2 MG/ML IJ SOLN
1.0000 mg | Freq: Once | INTRAMUSCULAR | Status: AC
  Administered 2024-10-23: 1 mg via INTRAVENOUS
  Filled 2024-10-23: qty 1

## 2024-10-23 MED ORDER — MORPHINE SULFATE (PF) 2 MG/ML IV SOLN
2.0000 mg | INTRAVENOUS | Status: DC | PRN
  Administered 2024-10-24 – 2024-10-26 (×8): 2 mg via INTRAVENOUS
  Filled 2024-10-23 (×6): qty 1

## 2024-10-23 MED ORDER — ORAL CARE MOUTH RINSE: 15.0000 mL | OROMUCOSAL | Status: DC | PRN

## 2024-10-23 MED ORDER — GADOBUTROL 1 MMOL/ML IV SOLN
10.0000 mL | Freq: Once | INTRAVENOUS | Status: AC | PRN
  Administered 2024-10-23: 10 mL via INTRAVENOUS

## 2024-10-23 MED ORDER — LACTATED RINGERS IV SOLN: INTRAVENOUS | Status: AC

## 2024-10-23 MED ORDER — MORPHINE SULFATE (PF) 4 MG/ML IV SOLN
4.0000 mg | Freq: Once | INTRAVENOUS | Status: AC
  Administered 2024-10-23: 4 mg via INTRAVENOUS
  Filled 2024-10-23: qty 1

## 2024-10-23 MED ORDER — HYDRALAZINE HCL 20 MG/ML IJ SOLN: 5.0000 mg | INTRAMUSCULAR | Status: DC | PRN

## 2024-10-23 MED ORDER — SODIUM CHLORIDE 0.9 % IV BOLUS
1000.0000 mL | Freq: Once | INTRAVENOUS | Status: AC
  Administered 2024-10-23: 1000 mL via INTRAVENOUS

## 2024-10-23 MED ORDER — ACETAMINOPHEN 325 MG PO TABS: 650.0000 mg | ORAL_TABLET | Freq: Four times a day (QID) | ORAL | Status: DC | PRN

## 2024-10-23 MED ORDER — ACETAMINOPHEN 650 MG RE SUPP: 650.0000 mg | Freq: Four times a day (QID) | RECTAL | Status: DC | PRN

## 2024-10-23 MED ORDER — IOHEXOL 350 MG/ML SOLN
75.0000 mL | Freq: Once | INTRAVENOUS | Status: AC | PRN
  Administered 2024-10-23: 75 mL via INTRAVENOUS

## 2024-10-23 MED ORDER — GABAPENTIN 300 MG PO CAPS
600.0000 mg | ORAL_CAPSULE | Freq: Three times a day (TID) | ORAL | Status: DC | PRN
  Administered 2024-10-24 – 2024-10-25 (×2): 600 mg via ORAL
  Filled 2024-10-23 (×2): qty 2

## 2024-10-23 NOTE — Plan of Care (Signed)
  Problem: Education: Goal: Knowledge of General Education information will improve Description: Including pain rating scale, medication(s)/side effects and non-pharmacologic comfort measures Outcome: Progressing   Problem: Clinical Measurements: Goal: Ability to maintain clinical measurements within normal limits will improve Outcome: Progressing   Problem: Elimination: Goal: Will not experience complications related to urinary retention Outcome: Progressing   Problem: Pain Managment: Goal: General experience of comfort will improve and/or be controlled Outcome: Progressing

## 2024-10-23 NOTE — ED Provider Notes (Signed)
 [place] EMERGENCY DEPARTMENT AT [facility] Provider Note   CSN: [id number] Arrival date & time: 10/23/24  9257     Patient presents with: Abdominal Pain   Brian Dominguez is a 78 y.o. male.  With a history of type 2 diabetes, diverticulitis, H. pylori and status postcholecystectomy presents to the ED for abdominal pain.  Right sided abdominal pain right-sided flank pain began yesterday and have persisted since the onset.  Some nausea with vomiting beginning last night.  Last bowel movement 2 days ago was normal.  No bloody bowel movements or hematemesis.  Denies fevers chills chest pain shortness of breath.  No urinary symptoms.  Underwent L5-S1 fusion with Dr. Beuford back in June and was previously taking oxycodone  for pain relief but has not had this medication in about a month.    Abdominal Pain      Prior to Admission medications   Medication Sig Start Date End Date Taking? Authorizing Provider  aspirin  EC 81 MG tablet Take 1 tablet (81 mg total) by mouth daily. Swallow whole. 04/28/24 04/28/25  Elicia Hamlet, MD  diclofenac  Sodium (VOLTAREN ) 1 % GEL Apply 1 Application topically 4 (four) times daily as needed (pain).    [provider]  fluticasone  (FLONASE ) 50 MCG/ACT nasal spray Place 2 sprays into both nostrils daily as needed for allergies. 10/29/22   [provider]  gabapentin  (NEURONTIN ) 600 MG tablet Take 600 mg by mouth 3 (three) times daily as needed (leg pain).    [provider]  lidocaine  (LIDODERM ) 5 % Place 1 patch onto the skin daily. Remove & Discard patch within 12 hours or as directed by MD 04/28/24   Elicia Hamlet, MD  losartan -hydrochlorothiazide  (HYZAAR) 100-25 MG tablet Take 1 tablet by mouth daily. 04/07/24   [provider]  metFORMIN  (GLUCOPHAGE ) 1000 MG tablet TAKE 1 TABLET (1,000 MG TOTAL) BY MOUTH 2 (TWO) TIMES DAILY WITH A MEAL. 05/29/21   Cresenzo, Coran V, MD  methocarbamol  (ROBAXIN ) 500 MG tablet Take 1  tablet (500 mg total) by mouth every 6 (six) hours as needed for muscle spasms. 09/21/24   Kammerer, Megan L, DO  naloxone  (NARCAN ) nasal spray 4 mg/0.1 mL Place 1 spray into the nose once as needed (OD).    [provider]  omeprazole  (PRILOSEC) 40 MG capsule Take 40 mg by mouth 2 (two) times daily as needed (acid reflux).    [provider]  oxyCODONE -acetaminophen  (PERCOCET) 10-325 MG tablet Take 1 tablet by mouth 4 (four) times daily as needed for pain. 03/31/24   [provider]  oxyCODONE -acetaminophen  (PERCOCET/ROXICET) 5-325 MG tablet Take 1-2 tablets by mouth every 4 (four) hours as needed for severe pain (pain score 7-10). 06/04/24   Beuford Anes, MD  polyethylene glycol (MIRALAX ) 17 g packet Take 17 g by mouth daily. 09/21/24   Kammerer, Megan L, DO  rosuvastatin  (CRESTOR ) 20 MG tablet Take 1 tablet (20 mg total) by mouth daily. 04/29/24   Elicia Hamlet, MD  tiZANidine  (ZANAFLEX ) 4 MG tablet Take 4 mg by mouth every 8 (eight) hours as needed for muscle spasms. 10/31/22   [provider]  Vitamin D , Ergocalciferol , (DRISDOL ) 1.25 MG (50000 UNIT) CAPS capsule Take 50,000 Units by mouth once a week. Thursdays 02/26/24   [provider]    Allergies: Enalapril    Review of Systems  Gastrointestinal:  Positive for abdominal pain.    Updated Vital Signs BP 134/86 (BP Location: Right Arm)   Pulse 89  Temp (!) 97.4 F (36.3 C) (Oral)   Resp 16   SpO2 100%   Physical Exam Vitals and nursing note reviewed.  HENT:     Head: Normocephalic and atraumatic.  Eyes:     Pupils: Pupils are equal, round, and reactive to light.  Cardiovascular:     Rate and Rhythm: Normal rate and regular rhythm.  Pulmonary:     Effort: Pulmonary effort is normal.     Breath sounds: Normal breath sounds.  Abdominal:     Palpations: Abdomen is soft.     Tenderness: There is abdominal tenderness in the right lower quadrant.     Comments: Right lower quadrant  right flank tenderness with no rebound rigidity or guarding  Skin:    General: Skin is warm and dry.     Comments: Well-healed midline surgical incision over lumbar region with no erythema or drainage No midline tenderness step-off deformity  Neurological:     Mental Status: He is alert.  Psychiatric:        Mood and Affect: Mood normal.     (all labs ordered are listed, but only abnormal results are displayed) Labs Reviewed  LIPASE, BLOOD - Abnormal; Notable for the following components:      Result Value   Lipase 274 (*)    All other components within normal limits  COMPREHENSIVE METABOLIC PANEL WITH GFR - Abnormal; Notable for the following components:   Sodium 134 (*)    Glucose, Bld 140 (*)    Albumin  3.3 (*)    All other components within normal limits  CBC - Abnormal; Notable for the following components:   Hemoglobin 11.4 (*)    HCT 35.3 (*)    MCV 78.3 (*)    MCH 25.3 (*)    RDW 17.7 (*)    All other components within normal limits  URINALYSIS, ROUTINE W REFLEX MICROSCOPIC    EKG: EKG Interpretation Date/Time:  Friday October 23 2024 08:04:41 EST Ventricular Rate:  90 PR Interval:  162 QRS Duration:  92 QT Interval:  376 QTC Calculation: 459 R Axis:   36  Text Interpretation: Sinus rhythm with occasional Premature ventricular complexes and Premature atrial complexes Otherwise normal ECG When compared with ECG of 21-Sep-2024 10:45, PREVIOUS ECG IS PRESENT when compared to prior, more wandering baseline No STEMI Confirmed by Ginger Barefoot ([id number]) on 10/23/2024 8:22:49 AM  Radiology: CT ABDOMEN PELVIS W CONTRAST Result Date: 10/23/2024 CLINICAL DATA:  Right lower quadrant pain. EXAM: CT ABDOMEN AND PELVIS WITH CONTRAST TECHNIQUE: Multidetector CT imaging of the abdomen and pelvis was performed using the standard protocol following bolus administration of intravenous contrast. RADIATION DOSE REDUCTION: This exam was performed according to the departmental  dose-optimization program which includes automated exposure control, adjustment of the mA and/or kV according to patient size and/or use of iterative reconstruction technique. CONTRAST:  75mL OMNIPAQUE  IOHEXOL  350 MG/ML SOLN COMPARISON:  09/21/2024 FINDINGS: Lower chest: Changes of pulmonary fibrosis noted in the lung bases. Hepatobiliary: No suspicious focal abnormality within the liver parenchyma. A tiny hypodensity in the liver parenchyma is too small to characterize but is statistically most likely benign. No followup imaging is recommended. Mild intrahepatic biliary duct prominence is new in the interval. Gallbladder surgically absent. Common bile duct measures 8 mm diameter in the head of the pancreas increased from about 5 mm previously. Pancreas: Interval development of diffuse main pancreatic duct dilatation measuring up to 6 mm diameter in the head of the pancreas. No obstructing mass lesion  visible in the pancreatic head. Spleen: No splenomegaly. No suspicious focal mass lesion. Adrenals/Urinary Tract: No adrenal nodule or mass. Left kidney unremarkable. Tiny well-defined homogeneous low-density lesions in the right kidney are too small to characterize but statistically most likely benign and probably cysts. No followup imaging is recommended. No evidence for hydroureter. The urinary bladder appears normal for the degree of distention. Stomach/Bowel: Stomach is unremarkable. No gastric wall thickening. No evidence of outlet obstruction. Duodenum is normally positioned as is the ligament of Treitz. No small bowel wall thickening. No small bowel dilatation. The terminal ileum is normal. The appendix is normal. No gross colonic mass. No colonic wall thickening. Diverticular changes are noted in the left colon without evidence of diverticulitis. Large stool volume distal sigmoid colon and rectum. Vascular/Lymphatic: There is moderate atherosclerotic calcification of the abdominal aorta without aneurysm. There  is no gastrohepatic or hepatoduodenal ligament lymphadenopathy. No retroperitoneal or mesenteric lymphadenopathy. No pelvic sidewall lymphadenopathy. Reproductive: Prostate gland is enlarged. Other: No intraperitoneal free fluid. Musculoskeletal: Lumbosacral fusion hardware evident. IMPRESSION: 1. Interval development of intra and extrahepatic biliary duct dilatation with diffuse dilatation of the main pancreatic duct measuring up to 6 mm diameter in the head of the pancreas. No obstructing mass lesion visible in the pancreatic head. Correlation with liver function test recommended. MRI/MRCP abdomen with and without contrast may prove helpful to further evaluate. 2. Large stool volume distal sigmoid colon and rectum. Imaging features could be compatible with clinical constipation. 3. Left colonic diverticulosis without diverticulitis. 4. Prostatomegaly. 5.  Aortic Atherosclerosis (ICD10-I70.0). Electronically Signed   By: Camellia Candle M.D.   On: 10/23/2024 10:05     Procedures   Medications Ordered in the ED  LORazepam  (ATIVAN ) injection 1 mg (has no administration in time range)  morphine  (PF) 4 MG/ML injection 4 mg (4 mg Intravenous Given 10/23/24 0913)  sodium chloride  0.9 % bolus 1,000 mL (0 mLs Intravenous Stopped 10/23/24 1035)  ondansetron  (ZOFRAN ) injection 4 mg (4 mg Intravenous Given 10/23/24 0924)  iohexol  (OMNIPAQUE ) 350 MG/ML injection 75 mL (75 mLs Intravenous Contrast Given 10/23/24 0943)  morphine  (PF) 4 MG/ML injection 4 mg (4 mg Intravenous Given 10/23/24 1043)    Clinical Course as of 10/23/24 1242  Fri Oct 23, 2024  0918 Lipase(!): 274 Lipase significantly elevated to 74 most consistent with acute pancreatitis.  UA negative.  No leukocytosis or significant metabolic derangement.  Awaiting CT [MP]  1116 Extra and intrahepatic duct dilation on CT.  Discussed with Vina from GI team.  Recommends MRCP as next step.  Patient still having persistent abdominal pain and nausea.  Will  admit to medicine for further treatment and GI workup [MP]  1241 Discussed with admitting hospitalist Dr. Tobie who graciously accepts this patient for admission [MP]    Clinical Course User Index [MP] Pamella Ozell LABOR, DO                                 Medical Decision Making 78 year old male with history as above presented to the ED for abdominal pain nausea vomiting beginning yesterday.  Right lower quadrant right flank tenderness on my exam.  Afebrile normotensive.    Differential diagnosis includes: Small bowel obstruction Acute intra-abdominal infectious/inflammatory process such as appendicitis, diverticulitis, pancreatitis and gastritis Urinary tract infection Atypical presentation for pneumonia Viral gastroenteritis  Will obtain laboratory workup including CBC with differential, metabolic panel, lipase and urinalysis along with CT abdomen pelvis  Morphine  for pain control  Amount and/or Complexity of Data Reviewed Labs: ordered. Decision-making details documented in ED Course. Radiology: ordered.  Risk Prescription drug management. Decision regarding hospitalization.        Final diagnoses:  Abdominal pain, unspecified abdominal location  Intrahepatic bile duct dilation    ED Discharge Orders     None          Pamella Ozell LABOR, DO 10/23/24 1118

## 2024-10-23 NOTE — ED Notes (Signed)
 Pt still in MRI

## 2024-10-23 NOTE — H&P (Signed)
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    acetaminophen  (TYLENOL ) suppository 650 mg   morphine  (PF) 2 MG/ML injection 2 mg   OR Linked Order Group    ondansetron  (ZOFRAN ) tablet 4 mg    ondansetron  (ZOFRAN ) injection 4 mg   hydrALAZINE (APRESOLINE) injection 5  mg   pantoprazole  (PROTONIX ) injection 40 mg   gadobutrol  (GADAVIST ) 1 MMOL/ML injection 10 mL     Orders Placed This Encounter  Procedures   MRSA Next Gen by PCR, Nasal   CT ABDOMEN PELVIS W CONTRAST   MR ABDOMEN MRCP W WO CONTAST   MR 3D Recon At Scanner   Lipase, blood   Comprehensive metabolic panel   CBC   Urinalysis, Routine w reflex microscopic -Urine, Clean Catch   Ethanol   Gamma GT   Comprehensive metabolic panel   CBC   Vitamin B12   Folate   Iron and TIBC   Ferritin   Reticulocytes   Occult blood card to lab, stool   Diet NPO time specified   Orthostatic vital signs   SCDs   Cardiac Monitoring Continuous x 24 hours Indications for use: Other; other indications for use: Monitor for ischemia   Vital signs   Notify physician  (specify)   Refer to Sidebar Report Mobility Protocol for Adult Inpatient   Initiate Adult Central Line Maintenance and Catheter Clearance Protocol for patients with central line (CVC, PICC, Port, Hemodialysis, Trialysis)   If patient diabetic or glucose greater than 140 notify physician for Sliding Scale Insulin  Orders   Intake and Output   Initiate CHG Protocol for patients in ICU/SD or any patient with a central line or foley catheter   Do not place and if present remove PureWick   Initiate Oral Care Protocol   Initiate Carrier Fluid Protocol   RN may order General Admission PRN Orders utilizing General Admission PRN medications (through manage orders) for the following patient needs: allergy symptoms (Claritin ), cold sores (Carmex), cough (Robitussin DM), eye irritation (Liquifilm Tears), hemorrhoids (Tucks), indigestion (Maalox), minor skin irritation (Hydrocortisone Cream), muscle pain Lucienne Gay), nose irritation (saline nasal spray) and sore throat (Chloraseptic spray).   Ambulate with assistance   Full code   Consult to gastroenterology   Consult to hospitalist   Consult for [facility] Admission   Pulse oximetry check with vital signs   Oxygen  therapy Mode or (Route): Nasal cannula; Liters Per Minute: 2; Keep O2 saturation between: greater than 92 %   I-Stat CG4 Lactic Acid   ED EKG   EKG 12-Lead   Type and screen   Saline lock IV   Admit to Inpatient (patient's expected length of stay will be greater than 2 midnights or inpatient only procedure)   Aspiration precautions   Fall precautions    Author: Mario LULLA Blanch, MD 12 pm- 8 pm. [facility]. 10/23/2024 5:00 PM Please note for any communication after hours contact [facility] Assigned provider on call on Amion.

## 2024-10-23 NOTE — ED Notes (Signed)
 Pt to MRI

## 2024-10-23 NOTE — ED Triage Notes (Addendum)
 Pt here for 10/10 epigastric pain beginning yesterday evening. Pt has  been  taking Oxycontin  for pain for a while but feels like maybe taking it on an empty stomach is related to this concern. Pt does endorse vomiting 2-3 times last night.
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
## 2024-10-24 DIAGNOSIS — R109 Unspecified abdominal pain: Secondary | ICD-10-CM | POA: Diagnosis not present

## 2024-10-24 LAB — GLUCOSE, CAPILLARY
Glucose-Capillary: 119 mg/dL — ABNORMAL HIGH (ref 70–99)
Glucose-Capillary: 107 mg/dL — ABNORMAL HIGH (ref 70–99)
Glucose-Capillary: 128 mg/dL — ABNORMAL HIGH (ref 70–99)
Glucose-Capillary: 106 mg/dL — ABNORMAL HIGH (ref 70–99)

## 2024-10-24 LAB — COMPREHENSIVE METABOLIC PANEL WITH GFR
ALT: 17 U/L (ref 0–44)
AST: 22 U/L (ref 15–41)
Albumin: 3.1 g/dL — ABNORMAL LOW (ref 3.5–5.0)
Alkaline Phosphatase: 49 U/L (ref 38–126)
Anion gap: 9 (ref 5–15)
BUN: 13 mg/dL (ref 8–23)
CO2: 24 mmol/L (ref 22–32)
Calcium: 9.1 mg/dL (ref 8.9–10.3)
Chloride: 101 mmol/L (ref 98–111)
Creatinine, Ser: 1.12 mg/dL (ref 0.61–1.24)
GFR, Estimated: >60 mL/min (ref >60)
Glucose, Bld: 137 mg/dL — ABNORMAL HIGH (ref 70–99)
Potassium: 4.2 mmol/L (ref 3.5–5.1)
Sodium: 134 mmol/L — ABNORMAL LOW (ref 135–145)
Total Bilirubin: 0.7 mg/dL (ref 0.0–1.2)
Total Protein: 7.2 g/dL (ref 6.5–8.1)

## 2024-10-24 LAB — CBC
HCT: 33 % — ABNORMAL LOW (ref 39.0–52.0)
Hemoglobin: 10.7 g/dL — ABNORMAL LOW (ref 13.0–17.0)
MCH: 25 pg — ABNORMAL LOW (ref 26.0–34.0)
MCHC: 32.4 g/dL (ref 30.0–36.0)
MCV: 77.1 fL — ABNORMAL LOW (ref 80.0–100.0)
Platelets: 332 K/uL (ref 150–400)
RBC: 4.28 MIL/uL (ref 4.22–5.81)
RDW: 17.2 % — ABNORMAL HIGH (ref 11.5–15.5)
WBC: 4.7 K/uL (ref 4.0–10.5)
nRBC: 0 % (ref 0.0–0.2)

## 2024-10-24 MED ORDER — PEG 3350-KCL-NA BICARB-NACL 420 G PO SOLR
4000.0000 mL | Freq: Once | ORAL | Status: DC
  Filled 2024-10-24 (×2): qty 4000

## 2024-10-24 MED ORDER — OXYCODONE HCL 5 MG PO TABS
5.0000 mg | ORAL_TABLET | Freq: Four times a day (QID) | ORAL | Status: DC | PRN
  Administered 2024-10-24 – 2024-10-27 (×5): 5 mg via ORAL
  Filled 2024-10-24 (×6): qty 1

## 2024-10-24 MED ORDER — LACTATED RINGERS IV SOLN: INTRAVENOUS | Status: DC

## 2024-10-24 MED ORDER — CYANOCOBALAMIN 1000 MCG/ML IJ SOLN
1000.0000 ug | Freq: Once | INTRAMUSCULAR | Status: AC
  Administered 2024-10-24: 1000 ug via INTRAMUSCULAR
  Filled 2024-10-24: qty 1

## 2024-10-24 MED ORDER — SODIUM CHLORIDE 0.9 % IV SOLN: INTRAVENOUS | Status: AC

## 2024-10-24 NOTE — Progress Notes (Signed)
 PROGRESS NOTE    Brian Dominguez  FMW:[id number] DOB: 1946/05/24 DOA: 10/23/2024 PCP: [facility]    Brief Narrative:   Brian Dominguez is a 78 y.o. male with past medical history  of cholecystectomy, diabetes mellitus type 2, essential hypertension, tobacco abuse, gastritis and gastroduodenitis, has been having generalized abdominal pain for about a week or so.  Does note intermittent melena that he thinks is from Pepto-Bismol.  Plan for EGD and colonoscopy on 11/16   Assessment and Plan: Abdominal Pain/ Mild Pancreatitis: -GI  consulted- plan for EGD/colonoscopy in AM    Diabetes mellitus type 2: -SSI for now   Tobacco abuse: Nicotine  patch.   Melena/anemia: Patient states he does have intermittent melena which he attributes to his Pepto-Bismol, however in light of his ongoing anemia it is necessary for us  to any chronic GI loss.  Patient is followed closely by GI in outpatient basis as well as in-house as GI is consulted.  Will continue with IV PPI stool occult anemia panel and education on avoiding NSAIDs and tobacco abuse.   Low B12 -replete IM and then PO   Essential hypertension: -BP controlled with meds on hold      DVT prophylaxis: SCDs Start: 10/23/24 1246    Code Status: Full Code Family Communication:   Disposition Plan:  Level of care: Progressive Status is: Inpatient     Consultants:  GI  Subjective: No SOB, no CP  Objective: Vitals:   10/23/24 2309 10/24/24 0325 10/24/24 0800 10/24/24 1101  BP: 117/68 121/88 130/65 126/75  Pulse: 73 85 87 85  Resp: 18 20 19    Temp: 98.3 F (36.8 C) 98.6 F (37 C) 98.1 F (36.7 C) 98.6 F (37 C)  TempSrc: Oral Oral Oral Oral  SpO2: 95% 96% 100% 95%  Weight:      Height:        Intake/Output Summary (Last 24 hours) at 10/24/2024 1213 Last data filed at 10/24/2024 1120 Gross per 24 hour  Intake 898.15 ml  Output 2450 ml  Net -1551.85 ml   Filed Weights   10/23/24 1527  Weight: 82 kg     Examination:   General: Appearance:    Well developed, well nourished male in no acute distress     Lungs:     Clear to auscultation bilaterally, respirations unlabored  Heart:    Normal heart rate. Normal rhythm. No murmurs, rubs, or gallops.    MS:   All extremities are intact.    Neurologic:   Awake, alert, oriented x 3. No apparent focal neurological           defect.        Data Reviewed: I have personally reviewed following labs and imaging studies  CBC: Recent Labs  Lab 10/23/24 0805 10/24/24 0347  WBC 5.6 4.7  HGB 11.4* 10.7*  HCT 35.3* 33.0*  MCV 78.3* 77.1*  PLT 365 332   Basic Metabolic Panel: Recent Labs  Lab 10/23/24 0805 10/24/24 0347  NA 134* 134*  K 4.1 4.2  CL 98 101  CO2 26 24  GLUCOSE 140* 137*  BUN 18 13  CREATININE 1.16 1.12  CALCIUM  9.3 9.1   GFR: Estimated Creatinine Clearance: 62 mL/min (by C-G formula based on SCr of 1.12 mg/dL). Liver Function Tests: Recent Labs  Lab 10/23/24 0805 10/24/24 0347  AST 19 22  ALT 13 17  ALKPHOS 48 49  BILITOT 0.8 0.7  PROT 7.9 7.2  ALBUMIN  3.3* 3.1*  Recent Labs  Lab 10/23/24 0805  LIPASE 274*   No results for input(s): AMMONIA in the last 168 hours. Coagulation Profile: No results for input(s): INR, PROTIME in the last 168 hours. Cardiac Enzymes: No results for input(s): CKTOTAL, CKMB, CKMBINDEX, TROPONINI in the last 168 hours. BNP (last 3 results) No results for input(s): PROBNP in the last 8760 hours. HbA1C: No results for input(s): HGBA1C in the last 72 hours. CBG: Recent Labs  Lab 10/23/24 1752 10/23/24 2128 10/24/24 0615 10/24/24 1129  GLUCAP 99 127* 119* 107*   Lipid Profile: No results for input(s): CHOL, HDL, LDLCALC, TRIG, CHOLHDL, LDLDIRECT in the last 72 hours. Thyroid  Function Tests: No results for input(s): TSH, T4TOTAL, FREET4, T3FREE, THYROIDAB in the last 72 hours. Anemia Panel: Recent Labs    10/23/24 1512   VITAMINB12 <150*  FOLATE 6.0  FERRITIN 100  TIBC 251  IRON 44*  RETICCTPCT 0.8   Sepsis Labs: No results for input(s): PROCALCITON, LATICACIDVEN in the last 168 hours.  Recent Results (from the past 240 hours)  MRSA Next Gen by PCR, Nasal     Status: None   Collection Time: 10/23/24  3:37 PM   Specimen: Nasal Mucosa; Nasal Swab  Result Value Ref Range Status   MRSA by PCR Next Gen NOT DETECTED NOT DETECTED Final    Comment: (NOTE) The GeneXpert MRSA Assay (FDA approved for NASAL specimens only), is one component of a comprehensive MRSA colonization surveillance program. It is not intended to diagnose MRSA infection nor to guide or monitor treatment for MRSA infections. Test performance is not FDA approved in patients less than 83 years old. Performed at [facility], [address], [place], [place] [address]          Radiology Studies: MR ABDOMEN MRCP W WO CONTAST Result Date: 10/23/2024 CLINICAL DATA:  Abdominal pain with nausea and vomiting. Biliary dilatation on CT. Elevated serum lipase level with normal LFT's. EXAM: MRI ABDOMEN WITHOUT AND WITH CONTRAST (INCLUDING MRCP) TECHNIQUE: Multiplanar multisequence MR imaging of the abdomen was performed both before and after the administration of intravenous contrast. Heavily T2-weighted images of the biliary and pancreatic ducts were obtained, and three-dimensional MRCP images were rendered by post processing. CONTRAST:  10mL GADAVIST  GADOBUTROL  1 MMOL/ML IV SOLN COMPARISON:  Abdominopelvic CT 10/23/2024, 09/21/2024 and 04/21/2024. Abdominal MRI 03/28/2016. FINDINGS: Technical note: Despite efforts by the technologist and patient, mild-to-moderate motion artifact is present on today's exam and could not be eliminated. This reduces exam sensitivity and specificity. Lower chest: Fibrotic changes are again noted at both lung bases. No definite superimposed airspace disease or significant pleural effusion. Hepatobiliary: The  liver has a non cirrhotic morphology without suspicious focal abnormality. There is a 7 mm cyst in the left hepatic lobe (image 25/3). No abnormal enhancement following contrast. Status post cholecystectomy with mild intra and extrahepatic biliary dilatation as seen on earlier CT. The common bile duct measures up to 10 mm in diameter and appears dilated to the ampulla. No evidence of choledocholithiasis or ampullary lesion. Pancreas: Mild pancreatic ductal dilatation with the pancreatic duct measuring up to 5 mm in the head of the pancreas. No focal pancreatic abnormality, surrounding inflammation or abnormal enhancement identified. Spleen: Normal in size without focal abnormality. Adrenals/Urinary Tract: Both adrenal glands appear normal. Stable small renal cysts for which no specific follow-up imaging is recommended. No evidence of enhancing renal mass or hydronephrosis. Stomach/Bowel: Surgical clips near the gastroesophageal junction with associated artifact. No evidence of bowel distension,  wall thickening or surrounding inflammation. Vascular/Lymphatic: There are no enlarged abdominal or pelvic lymph nodes. Aortic and branch vessel atherosclerosis, better seen on CT. No evidence of aneurysm or large vessel occlusion. Other: No evidence of abdominal wall mass or hernia. No ascites or pneumoperitoneum. Musculoskeletal: No acute or significant osseous findings. Postsurgical changes in the lower lumbar spine with mild spondylosis. IMPRESSION: 1. No cause demonstrated for the mild intra and extrahepatic biliary dilatation and mild pancreatic ductal dilatation status post cholecystectomy. No evidence of choledocholithiasis or ampullary lesion. In the absence of elevated liver function studies, this could be physiologic or related to pancreatitis. 2. No evidence of complicated pancreatitis or other acute abdominal findings. 3. Fibrotic changes at both lung bases. Electronically Signed   By: Elsie Perone M.D.   On:  10/23/2024 13:50   MR 3D Recon At Scanner Result Date: 10/23/2024 CLINICAL DATA:  Abdominal pain with nausea and vomiting. Biliary dilatation on CT. Elevated serum lipase level with normal LFT's. EXAM: MRI ABDOMEN WITHOUT AND WITH CONTRAST (INCLUDING MRCP) TECHNIQUE: Multiplanar multisequence MR imaging of the abdomen was performed both before and after the administration of intravenous contrast. Heavily T2-weighted images of the biliary and pancreatic ducts were obtained, and three-dimensional MRCP images were rendered by post processing. CONTRAST:  10mL GADAVIST  GADOBUTROL  1 MMOL/ML IV SOLN COMPARISON:  Abdominopelvic CT 10/23/2024, 09/21/2024 and 04/21/2024. Abdominal MRI 03/28/2016. FINDINGS: Technical note: Despite efforts by the technologist and patient, mild-to-moderate motion artifact is present on today's exam and could not be eliminated. This reduces exam sensitivity and specificity. Lower chest: Fibrotic changes are again noted at both lung bases. No definite superimposed airspace disease or significant pleural effusion. Hepatobiliary: The liver has a non cirrhotic morphology without suspicious focal abnormality. There is a 7 mm cyst in the left hepatic lobe (image 25/3). No abnormal enhancement following contrast. Status post cholecystectomy with mild intra and extrahepatic biliary dilatation as seen on earlier CT. The common bile duct measures up to 10 mm in diameter and appears dilated to the ampulla. No evidence of choledocholithiasis or ampullary lesion. Pancreas: Mild pancreatic ductal dilatation with the pancreatic duct measuring up to 5 mm in the head of the pancreas. No focal pancreatic abnormality, surrounding inflammation or abnormal enhancement identified. Spleen: Normal in size without focal abnormality. Adrenals/Urinary Tract: Both adrenal glands appear normal. Stable small renal cysts for which no specific follow-up imaging is recommended. No evidence of enhancing renal mass or  hydronephrosis. Stomach/Bowel: Surgical clips near the gastroesophageal junction with associated artifact. No evidence of bowel distension, wall thickening or surrounding inflammation. Vascular/Lymphatic: There are no enlarged abdominal or pelvic lymph nodes. Aortic and branch vessel atherosclerosis, better seen on CT. No evidence of aneurysm or large vessel occlusion. Other: No evidence of abdominal wall mass or hernia. No ascites or pneumoperitoneum. Musculoskeletal: No acute or significant osseous findings. Postsurgical changes in the lower lumbar spine with mild spondylosis. IMPRESSION: 1. No cause demonstrated for the mild intra and extrahepatic biliary dilatation and mild pancreatic ductal dilatation status post cholecystectomy. No evidence of choledocholithiasis or ampullary lesion. In the absence of elevated liver function studies, this could be physiologic or related to pancreatitis. 2. No evidence of complicated pancreatitis or other acute abdominal findings. 3. Fibrotic changes at both lung bases. Electronically Signed   By: Elsie Perone M.D.   On: 10/23/2024 13:50   CT ABDOMEN PELVIS W CONTRAST Result Date: 10/23/2024 CLINICAL DATA:  Right lower quadrant pain. EXAM: CT ABDOMEN AND PELVIS WITH CONTRAST TECHNIQUE: Multidetector CT  imaging of the abdomen and pelvis was performed using the standard protocol following bolus administration of intravenous contrast. RADIATION DOSE REDUCTION: This exam was performed according to the departmental dose-optimization program which includes automated exposure control, adjustment of the mA and/or kV according to patient size and/or use of iterative reconstruction technique. CONTRAST:  75mL OMNIPAQUE  IOHEXOL  350 MG/ML SOLN COMPARISON:  09/21/2024 FINDINGS: Lower chest: Changes of pulmonary fibrosis noted in the lung bases. Hepatobiliary: No suspicious focal abnormality within the liver parenchyma. A tiny hypodensity in the liver parenchyma is too small to  characterize but is statistically most likely benign. No followup imaging is recommended. Mild intrahepatic biliary duct prominence is new in the interval. Gallbladder surgically absent. Common bile duct measures 8 mm diameter in the head of the pancreas increased from about 5 mm previously. Pancreas: Interval development of diffuse main pancreatic duct dilatation measuring up to 6 mm diameter in the head of the pancreas. No obstructing mass lesion visible in the pancreatic head. Spleen: No splenomegaly. No suspicious focal mass lesion. Adrenals/Urinary Tract: No adrenal nodule or mass. Left kidney unremarkable. Tiny well-defined homogeneous low-density lesions in the right kidney are too small to characterize but statistically most likely benign and probably cysts. No followup imaging is recommended. No evidence for hydroureter. The urinary bladder appears normal for the degree of distention. Stomach/Bowel: Stomach is unremarkable. No gastric wall thickening. No evidence of outlet obstruction. Duodenum is normally positioned as is the ligament of Treitz. No small bowel wall thickening. No small bowel dilatation. The terminal ileum is normal. The appendix is normal. No gross colonic mass. No colonic wall thickening. Diverticular changes are noted in the left colon without evidence of diverticulitis. Large stool volume distal sigmoid colon and rectum. Vascular/Lymphatic: There is moderate atherosclerotic calcification of the abdominal aorta without aneurysm. There is no gastrohepatic or hepatoduodenal ligament lymphadenopathy. No retroperitoneal or mesenteric lymphadenopathy. No pelvic sidewall lymphadenopathy. Reproductive: Prostate gland is enlarged. Other: No intraperitoneal free fluid. Musculoskeletal: Lumbosacral fusion hardware evident. IMPRESSION: 1. Interval development of intra and extrahepatic biliary duct dilatation with diffuse dilatation of the main pancreatic duct measuring up to 6 mm diameter in the  head of the pancreas. No obstructing mass lesion visible in the pancreatic head. Correlation with liver function test recommended. MRI/MRCP abdomen with and without contrast may prove helpful to further evaluate. 2. Large stool volume distal sigmoid colon and rectum. Imaging features could be compatible with clinical constipation. 3. Left colonic diverticulosis without diverticulitis. 4. Prostatomegaly. 5.  Aortic Atherosclerosis (ICD10-I70.0). Electronically Signed   By: Camellia Candle M.D.   On: 10/23/2024 10:05        Scheduled Meds:  insulin  aspart  0-15 Units Subcutaneous TID WC   pantoprazole  (PROTONIX ) IV  40 mg Intravenous Q12H   polyethylene glycol-electrolytes  4,000 mL Oral Once   Continuous Infusions:  sodium chloride      lactated ringers  75 mL/hr at 10/24/24 0343     LOS: 1 day    Time spent: 45 minutes spent on chart review, discussion with nursing staff, consultants, updating family and interview/physical exam; more than 50% of that time was spent in counseling and/or coordination of care.    Harlene RAYMOND Bowl, DO [facility] Available via Epic secure chat 7am-7pm After these hours, please refer to coverage provider listed on [email or web address] 10/24/2024, 12:13 PM

## 2024-10-24 NOTE — Progress Notes (Signed)
 Subjective: No acute events.  Objective: Vital signs in last 24 hours: Temp:  [97.7 F (36.5 C)-98.6 F (37 C)] 98.6 F (37 C) (11/15 0325) Pulse Rate:  [73-92] 85 (11/15 0325) Resp:  [16-20] 20 (11/15 0325) BP: (117-139)/(68-88) 121/88 (11/15 0325) SpO2:  [95 %-100 %] 96 % (11/15 0325) Weight:  [82 kg] 82 kg (11/14 1527) Last BM Date : 10/22/24  Intake/Output from previous day: 11/14 0701 - 11/15 0700 In: 1728.2 [P.O.:140; I.V.:638.2; IV Piggyback:950] Out: 1550 [Urine:1550] Intake/Output this shift: No intake/output data recorded.  General appearance: alert and no distress GI: tender in the right side  Lab Results: Recent Labs    10/23/24 0805 10/24/24 0347  WBC 5.6 4.7  HGB 11.4* 10.7*  HCT 35.3* 33.0*  PLT 365 332   BMET Recent Labs    10/23/24 0805 10/24/24 0347  NA 134* 134*  K 4.1 4.2  CL 98 101  CO2 26 24  GLUCOSE 140* 137*  BUN 18 13  CREATININE 1.16 1.12  CALCIUM  9.3 9.1   LFT Recent Labs    10/24/24 0347  PROT 7.2  ALBUMIN  3.1*  AST 22  ALT 17  ALKPHOS 49  BILITOT 0.7   PT/INR No results for input(s): LABPROT, INR in the last 72 hours. Hepatitis Panel No results for input(s): HEPBSAG, HCVAB, HEPAIGM, HEPBIGM in the last 72 hours. C-Diff No results for input(s): CDIFFTOX in the last 72 hours. Fecal Lactopherrin No results for input(s): FECLLACTOFRN in the last 72 hours.  Studies/Results: MR ABDOMEN MRCP W WO CONTAST Result Date: 10/23/2024 CLINICAL DATA:  Abdominal pain with nausea and vomiting. Biliary dilatation on CT. Elevated serum lipase level with normal LFT's. EXAM: MRI ABDOMEN WITHOUT AND WITH CONTRAST (INCLUDING MRCP) TECHNIQUE: Multiplanar multisequence MR imaging of the abdomen was performed both before and after the administration of intravenous contrast. Heavily T2-weighted images of the biliary and pancreatic ducts were obtained, and three-dimensional MRCP images were rendered by post processing.  CONTRAST:  10mL GADAVIST  GADOBUTROL  1 MMOL/ML IV SOLN COMPARISON:  Abdominopelvic CT 10/23/2024, 09/21/2024 and 04/21/2024. Abdominal MRI 03/28/2016. FINDINGS: Technical note: Despite efforts by the technologist and patient, mild-to-moderate motion artifact is present on today's exam and could not be eliminated. This reduces exam sensitivity and specificity. Lower chest: Fibrotic changes are again noted at both lung bases. No definite superimposed airspace disease or significant pleural effusion. Hepatobiliary: The liver has a non cirrhotic morphology without suspicious focal abnormality. There is a 7 mm cyst in the left hepatic lobe (image 25/3). No abnormal enhancement following contrast. Status post cholecystectomy with mild intra and extrahepatic biliary dilatation as seen on earlier CT. The common bile duct measures up to 10 mm in diameter and appears dilated to the ampulla. No evidence of choledocholithiasis or ampullary lesion. Pancreas: Mild pancreatic ductal dilatation with the pancreatic duct measuring up to 5 mm in the head of the pancreas. No focal pancreatic abnormality, surrounding inflammation or abnormal enhancement identified. Spleen: Normal in size without focal abnormality. Adrenals/Urinary Tract: Both adrenal glands appear normal. Stable small renal cysts for which no specific follow-up imaging is recommended. No evidence of enhancing renal mass or hydronephrosis. Stomach/Bowel: Surgical clips near the gastroesophageal junction with associated artifact. No evidence of bowel distension, wall thickening or surrounding inflammation. Vascular/Lymphatic: There are no enlarged abdominal or pelvic lymph nodes. Aortic and branch vessel atherosclerosis, better seen on CT. No evidence of aneurysm or large vessel occlusion. Other: No evidence of abdominal wall mass or hernia. No ascites or pneumoperitoneum.  Musculoskeletal: No acute or significant osseous findings. Postsurgical changes in the lower lumbar  spine with mild spondylosis. IMPRESSION: 1. No cause demonstrated for the mild intra and extrahepatic biliary dilatation and mild pancreatic ductal dilatation status post cholecystectomy. No evidence of choledocholithiasis or ampullary lesion. In the absence of elevated liver function studies, this could be physiologic or related to pancreatitis. 2. No evidence of complicated pancreatitis or other acute abdominal findings. 3. Fibrotic changes at both lung bases. Electronically Signed   By: Elsie Perone M.D.   On: 10/23/2024 13:50   MR 3D Recon At Scanner Result Date: 10/23/2024 CLINICAL DATA:  Abdominal pain with nausea and vomiting. Biliary dilatation on CT. Elevated serum lipase level with normal LFT's. EXAM: MRI ABDOMEN WITHOUT AND WITH CONTRAST (INCLUDING MRCP) TECHNIQUE: Multiplanar multisequence MR imaging of the abdomen was performed both before and after the administration of intravenous contrast. Heavily T2-weighted images of the biliary and pancreatic ducts were obtained, and three-dimensional MRCP images were rendered by post processing. CONTRAST:  10mL GADAVIST  GADOBUTROL  1 MMOL/ML IV SOLN COMPARISON:  Abdominopelvic CT 10/23/2024, 09/21/2024 and 04/21/2024. Abdominal MRI 03/28/2016. FINDINGS: Technical note: Despite efforts by the technologist and patient, mild-to-moderate motion artifact is present on today's exam and could not be eliminated. This reduces exam sensitivity and specificity. Lower chest: Fibrotic changes are again noted at both lung bases. No definite superimposed airspace disease or significant pleural effusion. Hepatobiliary: The liver has a non cirrhotic morphology without suspicious focal abnormality. There is a 7 mm cyst in the left hepatic lobe (image 25/3). No abnormal enhancement following contrast. Status post cholecystectomy with mild intra and extrahepatic biliary dilatation as seen on earlier CT. The common bile duct measures up to 10 mm in diameter and appears dilated  to the ampulla. No evidence of choledocholithiasis or ampullary lesion. Pancreas: Mild pancreatic ductal dilatation with the pancreatic duct measuring up to 5 mm in the head of the pancreas. No focal pancreatic abnormality, surrounding inflammation or abnormal enhancement identified. Spleen: Normal in size without focal abnormality. Adrenals/Urinary Tract: Both adrenal glands appear normal. Stable small renal cysts for which no specific follow-up imaging is recommended. No evidence of enhancing renal mass or hydronephrosis. Stomach/Bowel: Surgical clips near the gastroesophageal junction with associated artifact. No evidence of bowel distension, wall thickening or surrounding inflammation. Vascular/Lymphatic: There are no enlarged abdominal or pelvic lymph nodes. Aortic and branch vessel atherosclerosis, better seen on CT. No evidence of aneurysm or large vessel occlusion. Other: No evidence of abdominal wall mass or hernia. No ascites or pneumoperitoneum. Musculoskeletal: No acute or significant osseous findings. Postsurgical changes in the lower lumbar spine with mild spondylosis. IMPRESSION: 1. No cause demonstrated for the mild intra and extrahepatic biliary dilatation and mild pancreatic ductal dilatation status post cholecystectomy. No evidence of choledocholithiasis or ampullary lesion. In the absence of elevated liver function studies, this could be physiologic or related to pancreatitis. 2. No evidence of complicated pancreatitis or other acute abdominal findings. 3. Fibrotic changes at both lung bases. Electronically Signed   By: Elsie Perone M.D.   On: 10/23/2024 13:50   CT ABDOMEN PELVIS W CONTRAST Result Date: 10/23/2024 CLINICAL DATA:  Right lower quadrant pain. EXAM: CT ABDOMEN AND PELVIS WITH CONTRAST TECHNIQUE: Multidetector CT imaging of the abdomen and pelvis was performed using the standard protocol following bolus administration of intravenous contrast. RADIATION DOSE REDUCTION: This exam  was performed according to the departmental dose-optimization program which includes automated exposure control, adjustment of the mA and/or kV according to  patient size and/or use of iterative reconstruction technique. CONTRAST:  75mL OMNIPAQUE  IOHEXOL  350 MG/ML SOLN COMPARISON:  09/21/2024 FINDINGS: Lower chest: Changes of pulmonary fibrosis noted in the lung bases. Hepatobiliary: No suspicious focal abnormality within the liver parenchyma. A tiny hypodensity in the liver parenchyma is too small to characterize but is statistically most likely benign. No followup imaging is recommended. Mild intrahepatic biliary duct prominence is new in the interval. Gallbladder surgically absent. Common bile duct measures 8 mm diameter in the head of the pancreas increased from about 5 mm previously. Pancreas: Interval development of diffuse main pancreatic duct dilatation measuring up to 6 mm diameter in the head of the pancreas. No obstructing mass lesion visible in the pancreatic head. Spleen: No splenomegaly. No suspicious focal mass lesion. Adrenals/Urinary Tract: No adrenal nodule or mass. Left kidney unremarkable. Tiny well-defined homogeneous low-density lesions in the right kidney are too small to characterize but statistically most likely benign and probably cysts. No followup imaging is recommended. No evidence for hydroureter. The urinary bladder appears normal for the degree of distention. Stomach/Bowel: Stomach is unremarkable. No gastric wall thickening. No evidence of outlet obstruction. Duodenum is normally positioned as is the ligament of Treitz. No small bowel wall thickening. No small bowel dilatation. The terminal ileum is normal. The appendix is normal. No gross colonic mass. No colonic wall thickening. Diverticular changes are noted in the left colon without evidence of diverticulitis. Large stool volume distal sigmoid colon and rectum. Vascular/Lymphatic: There is moderate atherosclerotic calcification of  the abdominal aorta without aneurysm. There is no gastrohepatic or hepatoduodenal ligament lymphadenopathy. No retroperitoneal or mesenteric lymphadenopathy. No pelvic sidewall lymphadenopathy. Reproductive: Prostate gland is enlarged. Other: No intraperitoneal free fluid. Musculoskeletal: Lumbosacral fusion hardware evident. IMPRESSION: 1. Interval development of intra and extrahepatic biliary duct dilatation with diffuse dilatation of the main pancreatic duct measuring up to 6 mm diameter in the head of the pancreas. No obstructing mass lesion visible in the pancreatic head. Correlation with liver function test recommended. MRI/MRCP abdomen with and without contrast may prove helpful to further evaluate. 2. Large stool volume distal sigmoid colon and rectum. Imaging features could be compatible with clinical constipation. 3. Left colonic diverticulosis without diverticulitis. 4. Prostatomegaly. 5.  Aortic Atherosclerosis (ICD10-I70.0). Electronically Signed   By: Camellia Candle M.D.   On: 10/23/2024 10:05    Medications: Scheduled:  cyanocobalamin  1,000 mcg Intramuscular Once   insulin  aspart  0-15 Units Subcutaneous TID WC   pantoprazole  (PROTONIX ) IV  40 mg Intravenous Q12H   polyethylene glycol-electrolytes  4,000 mL Oral Once   Continuous:  sodium chloride      lactated ringers  75 mL/hr at 10/24/24 0343    Assessment/Plan: 1) Chronic abdominal pain. 2) Microcytic anemia. 3) ? Acute versus chronic pancreatitis.   The patient remains stable clinically.  With the findings of the microcytosis, an EGD/colonoscopy will be performed tomorrow.  His last colonoscopy was in 2015 and no abnormalities were found.  The admission CT scan did not show any malignant pathology.  Plan: 1) EGD/colonoscopy tomorrow. 2) Maintain pantoprazole . 3) Monitor HGB.  LOS: 1 day   Aadil Sur D 10/24/2024, 7:53 AM

## 2024-10-24 NOTE — Progress Notes (Signed)
-----------------------------------------------------------  [facility] --------------------------------------------------------D(Data) A(Action) R(response) Note------------------------------------------------  Patient Name: Brian Dominguez Patient DOB: 06/30/1946 Date: 10/24/2024     Data: Patient is Med Surg level of care.     Action: Reached out to Dr Vann about patient transferring from Progressive unit to Med Surg unit.    Response:  Dr Juvenal placed tsf order to Med Surg unit.     ALEC Lim, RN [facility]

## 2024-10-25 ENCOUNTER — Inpatient Hospital Stay (HOSPITAL_COMMUNITY): Admitting: Certified Registered Nurse Anesthetist

## 2024-10-25 ENCOUNTER — Encounter (HOSPITAL_COMMUNITY): Payer: Self-pay | Admitting: Internal Medicine

## 2024-10-25 ENCOUNTER — Encounter (HOSPITAL_COMMUNITY)
Admission: EM | Disposition: A | Discharge status: Home / Self Care | Payer: Self-pay | Source: Home / Self Care | Attending: Internal Medicine

## 2024-10-25 DIAGNOSIS — I10 Essential (primary) hypertension: Secondary | ICD-10-CM

## 2024-10-25 DIAGNOSIS — R109 Unspecified abdominal pain: Secondary | ICD-10-CM | POA: Diagnosis not present

## 2024-10-25 DIAGNOSIS — Z87891 Personal history of nicotine dependence: Secondary | ICD-10-CM

## 2024-10-25 DIAGNOSIS — F419 Anxiety disorder, unspecified: Secondary | ICD-10-CM | POA: Diagnosis not present

## 2024-10-25 DIAGNOSIS — K922 Gastrointestinal hemorrhage, unspecified: Secondary | ICD-10-CM | POA: Diagnosis not present

## 2024-10-25 HISTORY — PX: ESOPHAGOGASTRODUODENOSCOPY: SHX5428

## 2024-10-25 LAB — CBC
HCT: 33.6 % — ABNORMAL LOW (ref 39.0–52.0)
Hemoglobin: 10.8 g/dL — ABNORMAL LOW (ref 13.0–17.0)
MCH: 24.9 pg — ABNORMAL LOW (ref 26.0–34.0)
MCHC: 32.1 g/dL (ref 30.0–36.0)
MCV: 77.4 fL — ABNORMAL LOW (ref 80.0–100.0)
Platelets: 309 K/uL (ref 150–400)
RBC: 4.34 MIL/uL (ref 4.22–5.81)
RDW: 17.2 % — ABNORMAL HIGH (ref 11.5–15.5)
WBC: 6.2 K/uL (ref 4.0–10.5)
nRBC: 0 % (ref 0.0–0.2)

## 2024-10-25 LAB — BASIC METABOLIC PANEL WITH GFR
Anion gap: 10 (ref 5–15)
BUN: 7 mg/dL — ABNORMAL LOW (ref 8–23)
CO2: 24 mmol/L (ref 22–32)
Calcium: 9 mg/dL (ref 8.9–10.3)
Chloride: 101 mmol/L (ref 98–111)
Creatinine, Ser: 1.06 mg/dL (ref 0.61–1.24)
GFR, Estimated: >60 mL/min (ref >60)
Glucose, Bld: 125 mg/dL — ABNORMAL HIGH (ref 70–99)
Potassium: 4.1 mmol/L (ref 3.5–5.1)
Sodium: 135 mmol/L (ref 135–145)

## 2024-10-25 LAB — GLUCOSE, CAPILLARY
Glucose-Capillary: 177 mg/dL — ABNORMAL HIGH (ref 70–99)
Glucose-Capillary: 110 mg/dL — ABNORMAL HIGH (ref 70–99)
Glucose-Capillary: 135 mg/dL — ABNORMAL HIGH (ref 70–99)
Glucose-Capillary: 97 mg/dL (ref 70–99)

## 2024-10-25 SURGERY — EGD (ESOPHAGOGASTRODUODENOSCOPY)
Anesthesia: Monitor Anesthesia Care

## 2024-10-25 MED ORDER — PHENYLEPHRINE HCL-NACL 20-0.9 MG/250ML-% IV SOLN
INTRAVENOUS | Status: DC | PRN
  Administered 2024-10-25: 35 ug/min via INTRAVENOUS

## 2024-10-25 MED ORDER — PROPOFOL 10 MG/ML IV BOLUS
INTRAVENOUS | Status: DC | PRN
Start: 2024-10-25 — End: 2024-10-25
  Administered 2024-10-25: 25 mg via INTRAVENOUS

## 2024-10-25 MED ORDER — PEG 3350-KCL-NA BICARB-NACL 420 G PO SOLR
4000.0000 mL | Freq: Once | ORAL | Status: AC
  Administered 2024-10-25: 4000 mL via ORAL
  Filled 2024-10-25: qty 4000

## 2024-10-25 MED ORDER — SODIUM CHLORIDE 0.9 % IV SOLN: INTRAVENOUS | Status: DC | PRN

## 2024-10-25 MED ORDER — VITAMIN B-12 1000 MCG PO TABS
1000.0000 ug | ORAL_TABLET | Freq: Every day | ORAL | Status: DC
  Administered 2024-10-25 – 2024-10-27 (×2): 1000 ug via ORAL
  Filled 2024-10-25 (×2): qty 1

## 2024-10-25 MED ORDER — PROPOFOL 500 MG/50ML IV EMUL
INTRAVENOUS | Status: DC | PRN
  Administered 2024-10-25: 125 ug/kg/min via INTRAVENOUS

## 2024-10-25 NOTE — Anesthesia Postprocedure Evaluation (Signed)
 Anesthesia Post Note  Patient: Brian Dominguez  Procedure(s) Performed: COLONOSCOPY EGD (ESOPHAGOGASTRODUODENOSCOPY)     Patient location during evaluation: PACU Anesthesia Type: MAC Level of consciousness: awake and alert Pain management: pain level controlled Vital Signs Assessment: post-procedure vital signs reviewed and stable Respiratory status: spontaneous breathing, nonlabored ventilation, respiratory function stable and patient connected to nasal cannula oxygen  Cardiovascular status: stable and blood pressure returned to baseline Postop Assessment: no apparent nausea or vomiting Anesthetic complications: no   No notable events documented.  Last Vitals:  Vitals:   10/25/24 1000 10/25/24 1010  BP: 123/75 136/80  Pulse: 82 82  Resp: 20 20  Temp:    SpO2: 95% 95%    Last Pain:  Vitals:   10/25/24 1010  TempSrc:   PainSc: 0-No pain                 [name]

## 2024-10-25 NOTE — Plan of Care (Signed)

## 2024-10-25 NOTE — Op Note (Signed)
 [facility] Patient Name: Brian Dominguez Procedure Date : 10/25/2024 MRN: [id number] Attending MD: Belvie Just , MD, [id number] Date of Birth: March 25, 1946 CSN: [id number] Age: 78 Admit Type: Inpatient Procedure:                Upper GI endoscopy Indications:              Epigastric abdominal pain, Iron deficiency anemia Providers:                Belvie Just, MD, Collene Edu, RN, Fairy Marina,                            Technician Referring MD:              Medicines:                Propofol  per Anesthesia Complications:            No immediate complications. Estimated Blood Loss:     Estimated blood loss: none. Procedure:                Pre-Anesthesia Assessment:                           - Prior to the procedure, a History and Physical                            was performed, and patient medications and                            allergies were reviewed. The patient's tolerance of                            previous anesthesia was also reviewed. The risks                            and benefits of the procedure and the sedation                            options and risks were discussed with the patient.                            All questions were answered, and informed consent                            was obtained. Prior Anticoagulants: The patient has                            taken no anticoagulant or antiplatelet agents. ASA                            Grade Assessment: III - A patient with severe                            systemic disease. After reviewing the risks and  benefits, the patient was deemed in satisfactory                            condition to undergo the procedure.                           - Sedation was administered by an anesthesia                            professional. Deep sedation was attained.                           After obtaining informed consent, the endoscope was                            passed  under direct vision. Throughout the                            procedure, the patient's blood pressure, pulse, and                            oxygen  saturations were monitored continuously. The                            [id number]-[id number] ([id number]) Olympus endoscope was introduced                            through the mouth, and advanced to the second part                            of duodenum. The upper GI endoscopy was                            accomplished without difficulty. The patient                            tolerated the procedure well. Scope In: Scope Out: Findings:      The esophagus was normal.      Multiple dispersed small erosions with stigmata of recent bleeding were       found in the gastric body and in the gastric antrum. Biopsies were taken       with a cold forceps for Helicobacter pylori testing.      The examined duodenum was normal. Impression:               - Normal esophagus.                           - Erosive gastropathy with stigmata of recent                            bleeding. Biopsied.                           - Normal examined duodenum. Recommendation:           - Return patient  to hospital ward for ongoing care.                           - Clear liquid diet.                           - Continue present medications.                           - Await pathology results.                           - Reprep for a colonoscopy tomorrow. Procedure Code(s):        --- Professional ---                           [phone], Esophagogastroduodenoscopy, flexible,                            transoral; with biopsy, single or multiple Diagnosis Code(s):        --- Professional ---                           K92.2, Gastrointestinal hemorrhage, unspecified                           R10.13, Epigastric pain                           D50.9, Iron deficiency anemia, unspecified CPT copyright 2022 [employer]. All rights reserved. The codes documented in this report  are preliminary and upon coder review may  be revised to meet current compliance requirements. Belvie Just, MD Belvie Just, MD 10/25/2024 9:44:37 AM This report has been signed electronically. Number of Addenda: 0

## 2024-10-25 NOTE — Progress Notes (Signed)
 Report given to endoscopy team.

## 2024-10-25 NOTE — OR Nursing (Signed)
 RN at bedside with patient approx 07:00am- patient eating jello and drinking juice/coffee from meal tray on bedside table. Pt states he was told he could have liquid things. Nurse educated patient that only until the prep was finished and NPO at midnight. Pt states he does not know why he can't get procedure done. Nurse educated patient on protecting his airway and lungs, the teams goal of keeping him safe, and I will speak with GI to come up with a new plan for the procedure.   RN called floor RN to notify GI providers decision to move case to tomorrow 11/17 for patient safety.   Collene JAYSON Edu [place]

## 2024-10-25 NOTE — Anesthesia Preprocedure Evaluation (Addendum)
 Anesthesia Evaluation  Patient identified by MRN, date of birth, ID band Patient awake    Reviewed: Allergy & Precautions, NPO status , Patient's Chart, lab work & pertinent test results  Airway Mallampati: I  TM Distance: >3 FB Neck ROM: Full    Dental  (+) Edentulous Upper, Edentulous Lower   Pulmonary former smoker    + decreased breath sounds      Cardiovascular hypertension, Pt. on medications  Rhythm:Regular Rate:Normal     Neuro/Psych  PSYCHIATRIC DISORDERS Anxiety Depression     Neuromuscular disease    GI/Hepatic Neg liver ROS, hiatal hernia,GERD  ,,  Endo/Other  diabetes, Type 2, Oral Hypoglycemic Agents    Renal/GU Renal disease     Musculoskeletal  (+) Arthritis ,    Abdominal   Peds  Hematology negative hematology ROS (+)   Anesthesia Other Findings   Reproductive/Obstetrics                              Anesthesia Physical Anesthesia Plan  ASA: 2  Anesthesia Plan: MAC   Post-op Pain Management: Minimal or no pain anticipated   Induction: Intravenous  PONV Risk Score and Plan: 0 and Propofol  infusion  Airway Management Planned: Natural Airway and Nasal Cannula  Additional Equipment: None  Intra-op Plan:   Post-operative Plan:   Informed Consent: I have reviewed the patients History and Physical, chart, labs and discussed the procedure including the risks, benefits and alternatives for the proposed anesthesia with the patient or authorized representative who has indicated his/her understanding and acceptance.       Plan Discussed with: CRNA  Anesthesia Plan Comments: (Lab Results      Component                Value               Date                      WBC                      6.2                 10/25/2024                HGB                      10.8 (L)            10/25/2024                HCT                      33.6 (L)            10/25/2024                 MCV                      77.4 (L)            10/25/2024                PLT                      309                 10/25/2024           )  Anesthesia Quick Evaluation

## 2024-10-25 NOTE — Progress Notes (Signed)
 PROGRESS NOTE    Brian Dominguez  FMW:[id number] DOB: 1946-11-06 DOA: 10/23/2024 PCP: [facility]    Brief Narrative:   Brian Dominguez is a 78 y.o. male with past medical history  of cholecystectomy, diabetes mellitus type 2, essential hypertension, tobacco abuse, gastritis and gastroduodenitis, has been having generalized abdominal pain for about a week or so.  Does note intermittent melena that he thinks is from Pepto-Bismol.  S/p EGD on 11/16.  Colonoscopy on 11/17   Assessment and Plan: Abdominal Pain/ Mild Pancreatitis: -GI  consulted- s/p EGD    Diabetes mellitus type 2: -SSI    Tobacco abuse: Nicotine  patch.   Melena/anemia: Patient states he does have intermittent melena which he attributes to his Pepto-Bismol, however in light of his ongoing anemia it is necessary for us  to any chronic GI loss.  Patient is followed closely by GI in outpatient basis as well as in-house as GI is consulted.  Will continue with IV PPI stool occult anemia panel and education on avoiding NSAIDs and tobacco abuse.   Low B12 -replete IM and then PO   Essential hypertension: -BP controlled with meds on hold      DVT prophylaxis: SCDs Start: 10/23/24 1246    Code Status: Full Code Family Communication:   Disposition Plan:  Level of care: Med-Surg Status is: Inpatient     Consultants:  GI  Subjective: Just back from EGD  Objective: Vitals:   10/25/24 0911 10/25/24 0945 10/25/24 1000 10/25/24 1010  BP: (!) 152/82 121/62 123/75 136/80  Pulse: 80 83 82 82  Resp: 20 20 20 20   Temp: 97.7 F (36.5 C) 98 F (36.7 C)    TempSrc: Temporal     SpO2: 98% 100% 95% 95%  Weight:      Height:        Intake/Output Summary (Last 24 hours) at 10/25/2024 1200 Last data filed at 10/25/2024 0935 Gross per 24 hour  Intake 1628.75 ml  Output 4225 ml  Net -2596.25 ml   Filed Weights   10/23/24 1527  Weight: 82 kg    Examination:   General: Appearance:    Well developed,  well nourished male in no acute distress     Lungs:     Clear to auscultation bilaterally, respirations unlabored  Heart:    Normal heart rate. Normal rhythm. No murmurs, rubs, or gallops.    MS:   All extremities are intact.    Neurologic:   Awake, alert, oriented x 3. No apparent focal neurological           defect.        Data Reviewed: I have personally reviewed following labs and imaging studies  CBC: Recent Labs  Lab 10/23/24 0805 10/24/24 0347 10/25/24 0428  WBC 5.6 4.7 6.2  HGB 11.4* 10.7* 10.8*  HCT 35.3* 33.0* 33.6*  MCV 78.3* 77.1* 77.4*  PLT 365 332 309   Basic Metabolic Panel: Recent Labs  Lab 10/23/24 0805 10/24/24 0347 10/25/24 0428  NA 134* 134* 135  K 4.1 4.2 4.1  CL 98 101 101  CO2 26 24 24   GLUCOSE 140* 137* 125*  BUN 18 13 7*  CREATININE 1.16 1.12 1.06  CALCIUM  9.3 9.1 9.0   GFR: Estimated Creatinine Clearance: 65.5 mL/min (by C-G formula based on SCr of 1.06 mg/dL). Liver Function Tests: Recent Labs  Lab 10/23/24 0805 10/24/24 0347  AST 19 22  ALT 13 17  ALKPHOS 48 49  BILITOT 0.8 0.7  PROT 7.9 7.2  ALBUMIN  3.3* 3.1*   Recent Labs  Lab 10/23/24 0805  LIPASE 274*   No results for input(s): AMMONIA in the last 168 hours. Coagulation Profile: No results for input(s): INR, PROTIME in the last 168 hours. Cardiac Enzymes: No results for input(s): CKTOTAL, CKMB, CKMBINDEX, TROPONINI in the last 168 hours. BNP (last 3 results) No results for input(s): PROBNP in the last 8760 hours. HbA1C: No results for input(s): HGBA1C in the last 72 hours. CBG: Recent Labs  Lab 10/24/24 1712 10/24/24 2052 10/25/24 0726 10/25/24 0949 10/25/24 1152  GLUCAP 128* 106* 177* 110* 135*   Lipid Profile: No results for input(s): CHOL, HDL, LDLCALC, TRIG, CHOLHDL, LDLDIRECT in the last 72 hours. Thyroid  Function Tests: No results for input(s): TSH, T4TOTAL, FREET4, T3FREE, THYROIDAB in the last 72  hours. Anemia Panel: Recent Labs    10/23/24 1512  VITAMINB12 <150*  FOLATE 6.0  FERRITIN 100  TIBC 251  IRON 44*  RETICCTPCT 0.8   Sepsis Labs: No results for input(s): PROCALCITON, LATICACIDVEN in the last 168 hours.  Recent Results (from the past 240 hours)  MRSA Next Gen by PCR, Nasal     Status: None   Collection Time: 10/23/24  3:37 PM   Specimen: Nasal Mucosa; Nasal Swab  Result Value Ref Range Status   MRSA by PCR Next Gen NOT DETECTED NOT DETECTED Final    Comment: (NOTE) The GeneXpert MRSA Assay (FDA approved for NASAL specimens only), is one component of a comprehensive MRSA colonization surveillance program. It is not intended to diagnose MRSA infection nor to guide or monitor treatment for MRSA infections. Test performance is not FDA approved in patients less than 29 years old. Performed at [facility], [address], [place], [place] [address]          Radiology Studies: MR ABDOMEN MRCP W WO CONTAST Result Date: 10/23/2024 CLINICAL DATA:  Abdominal pain with nausea and vomiting. Biliary dilatation on CT. Elevated serum lipase level with normal LFT's. EXAM: MRI ABDOMEN WITHOUT AND WITH CONTRAST (INCLUDING MRCP) TECHNIQUE: Multiplanar multisequence MR imaging of the abdomen was performed both before and after the administration of intravenous contrast. Heavily T2-weighted images of the biliary and pancreatic ducts were obtained, and three-dimensional MRCP images were rendered by post processing. CONTRAST:  10mL GADAVIST  GADOBUTROL  1 MMOL/ML IV SOLN COMPARISON:  Abdominopelvic CT 10/23/2024, 09/21/2024 and 04/21/2024. Abdominal MRI 03/28/2016. FINDINGS: Technical note: Despite efforts by the technologist and patient, mild-to-moderate motion artifact is present on today's exam and could not be eliminated. This reduces exam sensitivity and specificity. Lower chest: Fibrotic changes are again noted at both lung bases. No definite superimposed airspace  disease or significant pleural effusion. Hepatobiliary: The liver has a non cirrhotic morphology without suspicious focal abnormality. There is a 7 mm cyst in the left hepatic lobe (image 25/3). No abnormal enhancement following contrast. Status post cholecystectomy with mild intra and extrahepatic biliary dilatation as seen on earlier CT. The common bile duct measures up to 10 mm in diameter and appears dilated to the ampulla. No evidence of choledocholithiasis or ampullary lesion. Pancreas: Mild pancreatic ductal dilatation with the pancreatic duct measuring up to 5 mm in the head of the pancreas. No focal pancreatic abnormality, surrounding inflammation or abnormal enhancement identified. Spleen: Normal in size without focal abnormality. Adrenals/Urinary Tract: Both adrenal glands appear normal. Stable small renal cysts for which no specific follow-up imaging is recommended. No evidence of enhancing renal mass or hydronephrosis. Stomach/Bowel: Surgical clips  near the gastroesophageal junction with associated artifact. No evidence of bowel distension, wall thickening or surrounding inflammation. Vascular/Lymphatic: There are no enlarged abdominal or pelvic lymph nodes. Aortic and branch vessel atherosclerosis, better seen on CT. No evidence of aneurysm or large vessel occlusion. Other: No evidence of abdominal wall mass or hernia. No ascites or pneumoperitoneum. Musculoskeletal: No acute or significant osseous findings. Postsurgical changes in the lower lumbar spine with mild spondylosis. IMPRESSION: 1. No cause demonstrated for the mild intra and extrahepatic biliary dilatation and mild pancreatic ductal dilatation status post cholecystectomy. No evidence of choledocholithiasis or ampullary lesion. In the absence of elevated liver function studies, this could be physiologic or related to pancreatitis. 2. No evidence of complicated pancreatitis or other acute abdominal findings. 3. Fibrotic changes at both lung  bases. Electronically Signed   By: Elsie Perone M.D.   On: 10/23/2024 13:50   MR 3D Recon At Scanner Result Date: 10/23/2024 CLINICAL DATA:  Abdominal pain with nausea and vomiting. Biliary dilatation on CT. Elevated serum lipase level with normal LFT's. EXAM: MRI ABDOMEN WITHOUT AND WITH CONTRAST (INCLUDING MRCP) TECHNIQUE: Multiplanar multisequence MR imaging of the abdomen was performed both before and after the administration of intravenous contrast. Heavily T2-weighted images of the biliary and pancreatic ducts were obtained, and three-dimensional MRCP images were rendered by post processing. CONTRAST:  10mL GADAVIST  GADOBUTROL  1 MMOL/ML IV SOLN COMPARISON:  Abdominopelvic CT 10/23/2024, 09/21/2024 and 04/21/2024. Abdominal MRI 03/28/2016. FINDINGS: Technical note: Despite efforts by the technologist and patient, mild-to-moderate motion artifact is present on today's exam and could not be eliminated. This reduces exam sensitivity and specificity. Lower chest: Fibrotic changes are again noted at both lung bases. No definite superimposed airspace disease or significant pleural effusion. Hepatobiliary: The liver has a non cirrhotic morphology without suspicious focal abnormality. There is a 7 mm cyst in the left hepatic lobe (image 25/3). No abnormal enhancement following contrast. Status post cholecystectomy with mild intra and extrahepatic biliary dilatation as seen on earlier CT. The common bile duct measures up to 10 mm in diameter and appears dilated to the ampulla. No evidence of choledocholithiasis or ampullary lesion. Pancreas: Mild pancreatic ductal dilatation with the pancreatic duct measuring up to 5 mm in the head of the pancreas. No focal pancreatic abnormality, surrounding inflammation or abnormal enhancement identified. Spleen: Normal in size without focal abnormality. Adrenals/Urinary Tract: Both adrenal glands appear normal. Stable small renal cysts for which no specific follow-up imaging  is recommended. No evidence of enhancing renal mass or hydronephrosis. Stomach/Bowel: Surgical clips near the gastroesophageal junction with associated artifact. No evidence of bowel distension, wall thickening or surrounding inflammation. Vascular/Lymphatic: There are no enlarged abdominal or pelvic lymph nodes. Aortic and branch vessel atherosclerosis, better seen on CT. No evidence of aneurysm or large vessel occlusion. Other: No evidence of abdominal wall mass or hernia. No ascites or pneumoperitoneum. Musculoskeletal: No acute or significant osseous findings. Postsurgical changes in the lower lumbar spine with mild spondylosis. IMPRESSION: 1. No cause demonstrated for the mild intra and extrahepatic biliary dilatation and mild pancreatic ductal dilatation status post cholecystectomy. No evidence of choledocholithiasis or ampullary lesion. In the absence of elevated liver function studies, this could be physiologic or related to pancreatitis. 2. No evidence of complicated pancreatitis or other acute abdominal findings. 3. Fibrotic changes at both lung bases. Electronically Signed   By: Elsie Perone M.D.   On: 10/23/2024 13:50        Scheduled Meds:  insulin  aspart  0-15 Units  Subcutaneous TID WC   pantoprazole  (PROTONIX ) IV  40 mg Intravenous Q12H   polyethylene glycol-electrolytes  4,000 mL Oral Once   Continuous Infusions:     LOS: 2 days    Time spent: 45 minutes spent on chart review, discussion with nursing staff, consultants, updating family and interview/physical exam; more than 50% of that time was spent in counseling and/or coordination of care.    Harlene RAYMOND Bowl, DO [facility] Available via Epic secure chat 7am-7pm After these hours, please refer to coverage provider listed on [email or web address] 10/25/2024, 12:00 PM

## 2024-10-25 NOTE — Transfer of Care (Signed)
 Immediate Anesthesia Transfer of Care Note  Patient: Brian Dominguez  Procedure(s) Performed: COLONOSCOPY EGD (ESOPHAGOGASTRODUODENOSCOPY)  Patient Location: PACU  Anesthesia Type:MAC  Level of Consciousness: awake, alert , and oriented  Airway & Oxygen  Therapy: Patient Spontanous Breathing and Patient connected to face mask oxygen   Post-op Assessment: Report given to RN and Post -op Vital signs reviewed and stable  Post vital signs: Reviewed and stable  Last Vitals:  Vitals Value Taken Time  BP    Temp    Pulse    Resp    SpO2      Last Pain:  Vitals:   10/25/24 0911  TempSrc: Temporal  PainSc: 3       Patients Stated Pain Goal: 0 (10/23/24 1527)  Complications: No notable events documented.

## 2024-10-25 NOTE — OR Nursing (Signed)
 GI provider spoke with patient and is able to do procedure today 11/16 per Anesthesia guidelines, minimum delay two hour for clear liquids. Procedure happening after 09:00am today. Brian Dominguez

## 2024-10-26 ENCOUNTER — Encounter (HOSPITAL_COMMUNITY): Payer: Self-pay | Admitting: Gastroenterology

## 2024-10-26 ENCOUNTER — Encounter (HOSPITAL_COMMUNITY): Admission: EM | Disposition: A | Payer: Self-pay | Source: Home / Self Care | Attending: Internal Medicine

## 2024-10-26 ENCOUNTER — Inpatient Hospital Stay (HOSPITAL_COMMUNITY): Admitting: Certified Registered Nurse Anesthetist

## 2024-10-26 DIAGNOSIS — D509 Iron deficiency anemia, unspecified: Secondary | ICD-10-CM

## 2024-10-26 DIAGNOSIS — I10 Essential (primary) hypertension: Secondary | ICD-10-CM | POA: Diagnosis not present

## 2024-10-26 DIAGNOSIS — Z87891 Personal history of nicotine dependence: Secondary | ICD-10-CM

## 2024-10-26 DIAGNOSIS — K5520 Angiodysplasia of colon without hemorrhage: Secondary | ICD-10-CM

## 2024-10-26 DIAGNOSIS — R109 Unspecified abdominal pain: Secondary | ICD-10-CM | POA: Diagnosis not present

## 2024-10-26 HISTORY — PX: COLONOSCOPY: SHX5424

## 2024-10-26 LAB — BASIC METABOLIC PANEL WITH GFR
Anion gap: 9 (ref 5–15)
BUN: <5 mg/dL — ABNORMAL LOW (ref 8–23)
CO2: 29 mmol/L (ref 22–32)
Calcium: 9.2 mg/dL (ref 8.9–10.3)
Chloride: 101 mmol/L (ref 98–111)
Creatinine, Ser: 0.95 mg/dL (ref 0.61–1.24)
GFR, Estimated: >60 mL/min (ref >60)
Glucose, Bld: 122 mg/dL — ABNORMAL HIGH (ref 70–99)
Potassium: 4 mmol/L (ref 3.5–5.1)
Sodium: 139 mmol/L (ref 135–145)

## 2024-10-26 LAB — CBC
HCT: 34.8 % — ABNORMAL LOW (ref 39.0–52.0)
Hemoglobin: 11.2 g/dL — ABNORMAL LOW (ref 13.0–17.0)
MCH: 25.1 pg — ABNORMAL LOW (ref 26.0–34.0)
MCHC: 32.2 g/dL (ref 30.0–36.0)
MCV: 78 fL — ABNORMAL LOW (ref 80.0–100.0)
Platelets: 355 K/uL (ref 150–400)
RBC: 4.46 MIL/uL (ref 4.22–5.81)
RDW: 17.3 % — ABNORMAL HIGH (ref 11.5–15.5)
WBC: 4.8 K/uL (ref 4.0–10.5)
nRBC: 0 % (ref 0.0–0.2)

## 2024-10-26 LAB — GLUCOSE, CAPILLARY
Glucose-Capillary: 131 mg/dL — ABNORMAL HIGH (ref 70–99)
Glucose-Capillary: 109 mg/dL — ABNORMAL HIGH (ref 70–99)
Glucose-Capillary: 100 mg/dL — ABNORMAL HIGH (ref 70–99)
Glucose-Capillary: 219 mg/dL — ABNORMAL HIGH (ref 70–99)
Glucose-Capillary: 68 mg/dL — ABNORMAL LOW (ref 70–99)
Glucose-Capillary: 78 mg/dL (ref 70–99)

## 2024-10-26 SURGERY — COLONOSCOPY
Anesthesia: Monitor Anesthesia Care

## 2024-10-26 MED ORDER — PROPOFOL 500 MG/50ML IV EMUL
INTRAVENOUS | Status: DC | PRN
  Administered 2024-10-26: 100 ug/kg/min via INTRAVENOUS

## 2024-10-26 MED ORDER — ONDANSETRON HCL 4 MG/2ML IJ SOLN: 4.0000 mg | Freq: Once | INTRAMUSCULAR | Status: DC | PRN

## 2024-10-26 MED ORDER — FINASTERIDE 5 MG PO TABS
5.0000 mg | ORAL_TABLET | Freq: Every day | ORAL | Status: DC
  Administered 2024-10-26 – 2024-10-27 (×2): 5 mg via ORAL
  Filled 2024-10-26 (×3): qty 1

## 2024-10-26 MED ORDER — MORPHINE SULFATE (PF) 2 MG/ML IV SOLN
INTRAVENOUS | Status: AC
  Filled 2024-10-26: qty 1

## 2024-10-26 MED ORDER — PROPOFOL 10 MG/ML IV BOLUS
INTRAVENOUS | Status: DC | PRN
Start: 2024-10-26 — End: 2024-10-26
  Administered 2024-10-26: 40 mg via INTRAVENOUS

## 2024-10-26 MED ORDER — AMISULPRIDE (ANTIEMETIC) 5 MG/2ML IV SOLN: 10.0000 mg | Freq: Once | INTRAVENOUS | Status: DC | PRN

## 2024-10-26 MED ORDER — HYDROCHLOROTHIAZIDE 25 MG PO TABS
25.0000 mg | ORAL_TABLET | Freq: Every day | ORAL | Status: DC
  Administered 2024-10-26 – 2024-10-27 (×2): 25 mg via ORAL
  Filled 2024-10-26 (×2): qty 1

## 2024-10-26 MED ORDER — LOSARTAN POTASSIUM-HCTZ 100-25 MG PO TABS: 1.0000 | ORAL_TABLET | Freq: Every day | ORAL | Status: DC

## 2024-10-26 MED ORDER — SODIUM CHLORIDE 0.9 % IV SOLN: INTRAVENOUS | Status: DC

## 2024-10-26 MED ORDER — LOSARTAN POTASSIUM 50 MG PO TABS
100.0000 mg | ORAL_TABLET | Freq: Every day | ORAL | Status: DC
  Administered 2024-10-26 – 2024-10-27 (×2): 100 mg via ORAL
  Filled 2024-10-26 (×2): qty 2

## 2024-10-26 MED ORDER — SODIUM CHLORIDE 0.9 % IV SOLN: INTRAVENOUS | Status: DC | PRN

## 2024-10-26 MED ORDER — DULOXETINE HCL 60 MG PO CPEP
120.0000 mg | ORAL_CAPSULE | Freq: Every day | ORAL | Status: DC
  Administered 2024-10-26 – 2024-10-27 (×2): 120 mg via ORAL
  Filled 2024-10-26 (×2): qty 2

## 2024-10-26 NOTE — Plan of Care (Signed)

## 2024-10-26 NOTE — Anesthesia Preprocedure Evaluation (Signed)
 Anesthesia Evaluation  Patient identified by MRN, date of birth, ID band Patient awake    Reviewed: Allergy & Precautions, NPO status , Patient's Chart, lab work & pertinent test results  Airway Mallampati: II  TM Distance: >3 FB Neck ROM: Full    Dental  (+) Edentulous Upper, Edentulous Lower   Pulmonary neg pulmonary ROS, former smoker   Pulmonary exam normal        Cardiovascular hypertension, +CHF  + dysrhythmias Atrial Fibrillation  Rhythm:Regular Rate:Normal     Neuro/Psych negative neurological ROS  negative psych ROS   GI/Hepatic Neg liver ROS, PUD,GERD  Medicated,,  Endo/Other  diabetes, Type 2, Oral Hypoglycemic Agents    Renal/GU   negative genitourinary   Musculoskeletal  (+) Arthritis ,    Abdominal Normal abdominal exam  (+)   Peds  Hematology  (+) Blood dyscrasia, anemia   Anesthesia Other Findings   Reproductive/Obstetrics                             Anesthesia Physical Anesthesia Plan  ASA: 3  Anesthesia Plan: MAC and Regional   Post-op Pain Management:    Induction: Intravenous  PONV Risk Score and Plan: 1 and Ondansetron, Dexamethasone, Propofol infusion and Treatment may vary due to age or medical condition  Airway Management Planned: Simple Face Mask, Natural Airway and Nasal Cannula  Additional Equipment: None  Intra-op Plan:   Post-operative Plan:   Informed Consent: I have reviewed the patients History and Physical, chart, labs and discussed the procedure including the risks, benefits and alternatives for the proposed anesthesia with the patient or authorized representative who has indicated his/her understanding and acceptance.     Dental advisory given  Plan Discussed with: CRNA  Anesthesia Plan Comments:        Anesthesia Quick Evaluation

## 2024-10-26 NOTE — Transfer of Care (Signed)
 Immediate Anesthesia Transfer of Care Note  Patient: Brian Dominguez  Procedure(s) Performed: COLONOSCOPY  Patient Location: PACU and Endoscopy Unit  Anesthesia Type:MAC  Level of Consciousness: awake and patient cooperative  Airway & Oxygen  Therapy: Patient Spontanous Breathing and Patient connected to nasal cannula oxygen   Post-op Assessment: Report given to RN and Post -op Vital signs reviewed and stable  Post vital signs: Reviewed and stable  Last Vitals:  Vitals Value Taken Time  BP    Temp    Pulse    Resp    SpO2      Last Pain:  Vitals:   10/26/24 1140  TempSrc: Temporal  PainSc: 7       Patients Stated Pain Goal: 0 (10/23/24 1527)  Complications: No notable events documented.

## 2024-10-26 NOTE — TOC CM/SW Note (Signed)
 Transition of Care [facility]) - Inpatient Brief Assessment   Patient Details  Name: JCION BUDDENHAGEN MRN: [id number] Date of Birth: May 04, 1946  Transition of Care [facility]) CM/SW Contact:    Lauraine FORBES Saa, LCSWA Phone Number: 10/26/2024, 9:23 AM   Clinical Narrative:  9:23 AM Per chart review, patient resides at home. Patient has a PCP and insurance. Patient does not have SNF history. Patient has HH history with [facility] and [facility]. Patient has DME (BSC, RW) history with [facility]. Patient's preferred pharmacy's are [facility], [facility] [address], and [facility] [address]. No TOC needs identified at this time. TOC will continue to follow.  Transition of Care Asessment: Insurance and Status: Insurance coverage has been reviewed Patient has primary care physician: Yes Home environment has been reviewed: Private Residence Prior level of function:: N/A Prior/Current Home Services: No current home services Social Drivers of Health Review: SDOH reviewed no interventions necessary Readmission risk has been reviewed: Yes (Currently Green 14%) Transition of care needs: no transition of care needs at this time

## 2024-10-26 NOTE — Progress Notes (Signed)
 PROGRESS NOTE    Brian Dominguez  FMW:[id number] DOB: 06-24-1946 DOA: 10/23/2024 [facility]CP: [facility]    Brief Narrative:   Brian Dominguez is a 78 y.o. male with past medical history  of cholecystectomy, diabetes mellitus type 2, essential hypertension, tobacco abuse, gastritis and gastroduodenitis, has been having generalized abdominal pain for about a week or so.  Does note intermittent melena that he thinks is from Pepto-Bismol.  S/p EGD on 11/16.  Colonoscopy on 11/17   Assessment and Plan: Abdominal Pain/ Mild Pancreatitis: -GI  consulted- s/p EGD -colonoscopy on 11/17    Diabetes mellitus type 2: -SSI    Tobacco abuse: Nicotine  patch.   Melena/anemia: Patient states he does have intermittent melena which he attributes to his Pepto-Bismol, however in light of his ongoing anemia it is necessary for us  to any chronic GI loss.  Patient is followed closely by GI in outpatient basis as well as in-house as GI is consulted. -colonoscopy pending   Low B12 -replete IM and then PO   Essential hypertension: -resume home meds     DVT prophylaxis: SCDs Start: 10/23/24 1246    Code Status: Full Code   Disposition Plan:  Level of care: Med-Surg Status is: Inpatient     Consultants:  GI  Subjective: Going for colonoscopy  Objective: Vitals:   10/25/24 1550 10/25/24 2000 10/26/24 0700 10/26/24 1140  BP: (!) 134/90 130/73 (!) (P) 142/74 (!) 153/83  Pulse: 88 89 (P) 80 75  Resp: 19 18 (P) 17 18  Temp: 98.3 F (36.8 C) 98.7 F (37.1 C) (P) 98.7 F (37.1 C) (!) 97.2 F (36.2 C)  TempSrc: Oral Oral (P) Oral Temporal  SpO2: 100% 99% (P) 100% 98%  Weight:    82 kg  Height:    6' 3 (1.905 m)    Intake/Output Summary (Last 24 hours) at 10/26/2024 1147 Last data filed at 10/25/2024 2127 Gross per 24 hour  Intake --  Output 800 ml  Net -800 ml   Filed Weights   10/23/24 1527 10/26/24 1140  Weight: 82 kg 82 kg    Examination:   General: Appearance:     Well developed, well nourished male in no acute distress     Lungs:      respirations unlabored  Heart:    Normal heart rate. Normal rhythm. No murmurs, rubs, or gallops.    MS:   All extremities are intact.    Neurologic:   Awake, alert       Data Reviewed: I have personally reviewed following labs and imaging studies  CBC: Recent Labs  Lab 10/23/24 0805 10/24/24 0347 10/25/24 0428 10/26/24 0614  WBC 5.6 4.7 6.2 4.8  HGB 11.4* 10.7* 10.8* 11.2*  HCT 35.3* 33.0* 33.6* 34.8*  MCV 78.3* 77.1* 77.4* 78.0*  PLT 365 332 309 355   Basic Metabolic Panel: Recent Labs  Lab 10/23/24 0805 10/24/24 0347 10/25/24 0428 10/26/24 0614  NA 134* 134* 135 139  K 4.1 4.2 4.1 4.0  CL 98 101 101 101  CO2 26 24 24 29   GLUCOSE 140* 137* 125* 122*  BUN 18 13 7* <5*  CREATININE 1.16 1.12 1.06 0.95  CALCIUM  9.3 9.1 9.0 9.2   GFR: Estimated Creatinine Clearance: 73.1 mL/min (by C-G formula based on SCr of 0.95 mg/dL). Liver Function Tests: Recent Labs  Lab 10/23/24 0805 10/24/24 0347  AST 19 22  ALT 13 17  ALKPHOS 48 49  BILITOT 0.8 0.7  PROT 7.9 7.2  ALBUMIN  3.3* 3.1*   Recent Labs  Lab 10/23/24 0805  LIPASE 274*   No results for input(s): AMMONIA in the last 168 hours. Coagulation Profile: No results for input(s): INR, PROTIME in the last 168 hours. Cardiac Enzymes: No results for input(s): CKTOTAL, CKMB, CKMBINDEX, TROPONINI in the last 168 hours. BNP (last 3 results) No results for input(s): PROBNP in the last 8760 hours. HbA1C: No results for input(s): HGBA1C in the last 72 hours. CBG: Recent Labs  Lab 10/25/24 0949 10/25/24 1152 10/25/24 1547 10/26/24 0752 10/26/24 1131  GLUCAP 110* 135* 97 131* 109*   Lipid Profile: No results for input(s): CHOL, HDL, LDLCALC, TRIG, CHOLHDL, LDLDIRECT in the last 72 hours. Thyroid  Function Tests: No results for input(s): TSH, T4TOTAL, FREET4, T3FREE, THYROIDAB in the last 72  hours. Anemia Panel: Recent Labs    10/23/24 1512  VITAMINB12 <150*  FOLATE 6.0  FERRITIN 100  TIBC 251  IRON 44*  RETICCTPCT 0.8   Sepsis Labs: No results for input(s): PROCALCITON, LATICACIDVEN in the last 168 hours.  Recent Results (from the past 240 hours)  MRSA Next Gen by PCR, Nasal     Status: None   Collection Time: 10/23/24  3:37 PM   Specimen: Nasal Mucosa; Nasal Swab  Result Value Ref Range Status   MRSA by PCR Next Gen NOT DETECTED NOT DETECTED Final    Comment: (NOTE) The GeneXpert MRSA Assay (FDA approved for NASAL specimens only), is one component of a comprehensive MRSA colonization surveillance program. It is not intended to diagnose MRSA infection nor to guide or monitor treatment for MRSA infections. Test performance is not FDA approved in patients less than 9 years old. Performed at [facility], [address], [place], [place] [address]          Radiology Studies: No results found.       Scheduled Meds:  [MAR Hold] vitamin B-12  1,000 mcg Oral Daily   [MAR Hold] insulin  aspart  0-15 Units Subcutaneous TID WC   [MAR Hold] pantoprazole  (PROTONIX ) IV  40 mg Intravenous Q12H   Continuous Infusions:     LOS: 3 days    Time spent: 45 minutes spent on chart review, discussion with nursing staff, consultants, updating family and interview/physical exam; more than 50% of that time was spent in counseling and/or coordination of care.    Harlene RAYMOND Bowl, DO [facility] Available via Epic secure chat 7am-7pm After these hours, please refer to coverage provider listed on [email or web address] 10/26/2024, 11:47 AM

## 2024-10-26 NOTE — Progress Notes (Signed)
 Pt awoke with pain in right flank area. Pain med provided. Pt still passing brown water , more clear than it was last night. Bed and gown changed x 2

## 2024-10-26 NOTE — Op Note (Signed)
 [facility] Patient Name: Brian Dominguez Procedure Date : 10/26/2024 MRN: [id number] Attending MD: Belvie Just , MD, [id number] Date of Birth: December 19, 1945 CSN: [id number] Age: 78 Admit Type: Inpatient Procedure:                Colonoscopy Indications:              Iron deficiency anemia Providers:                Belvie Just, MD, Ozell Pouch Referring MD:              Medicines:                Propofol  per Anesthesia Complications:            No immediate complications. Estimated Blood Loss:     Estimated blood loss: none. Procedure:                Pre-Anesthesia Assessment:                           - Prior to the procedure, a History and Physical                            was performed, and patient medications and                            allergies were reviewed. The patient's tolerance of                            previous anesthesia was also reviewed. The risks                            and benefits of the procedure and the sedation                            options and risks were discussed with the patient.                            All questions were answered, and informed consent                            was obtained. Prior Anticoagulants: The patient has                            taken no anticoagulant or antiplatelet agents. ASA                            Grade Assessment: III - A patient with severe                            systemic disease. After reviewing the risks and                            benefits, the patient was deemed in satisfactory  condition to undergo the procedure.                           - Sedation was administered by an anesthesia                            professional. Deep sedation was attained.                           After obtaining informed consent, the colonoscope                            was passed under direct vision. Throughout the                            procedure, the patient's  blood pressure, pulse, and                            oxygen  saturations were monitored continuously. The                            [id number]-HQ190L ([id number] Olympus colonoscope was                            introduced through the anus and advanced to the the                            cecum, identified by appendiceal orifice and                            ileocecal valve. The colonoscopy was somewhat                            difficult due to significant looping. Successful                            completion of the procedure was aided by using                            manual pressure and straightening and shortening                            the scope to obtain bowel loop reduction. The                            patient tolerated the procedure well. The quality                            of the bowel preparation was evaluated using the                            BBPS [facility] Bowel Preparation Scale) with scores  of: Right Colon = 2 (minor amount of residual                            staining, small fragments of stool and/or opaque                            liquid, but mucosa seen well), Transverse Colon = 2                            (minor amount of residual staining, small fragments                            of stool and/or opaque liquid, but mucosa seen                            well) and Left Colon = 2 (minor amount of residual                            staining, small fragments of stool and/or opaque                            liquid, but mucosa seen well). The total BBPS score                            equals 6. The quality of the bowel preparation was                            good. The ileocecal valve, appendiceal orifice, and                            rectum were photographed. Scope In: 12:15:12 PM Scope Out: 12:34:27 PM Scope Withdrawal Time: 0 hours 13 minutes 47 seconds  Total Procedure Duration: 0 hours 19 minutes 15 seconds   Findings:      A single medium-sized localized angiodysplastic lesion without bleeding       was found in the cecum. Coagulation for tissue destruction using       monopolar probe was successful.      Scattered large-mouthed and medium-mouthed diverticula were found in the       entire colon. Impression:               - A single non-bleeding colonic angiodysplastic                            lesion. Treated with a monopolar probe.                           - Diverticulosis in the entire examined colon.                           - No specimens collected. Recommendation:           - Return patient to hospital ward for ongoing care.                           -  Resume regular diet.                           - Continue present medications.                           - Follow up in the office in 1-2 weeks. Procedure Code(s):        --- Professional ---                           [phone], Colonoscopy, flexible; with ablation of                            tumor(s), polyp(s), or other lesion(s) (includes                            pre- and post-dilation and guide wire passage, when                            performed) Diagnosis Code(s):        --- Professional ---                           D50.9, Iron deficiency anemia, unspecified                           K55.20, Angiodysplasia of colon without hemorrhage                           K57.30, Diverticulosis of large intestine without                            perforation or abscess without bleeding CPT copyright 2022 American Medical Association. All rights reserved. The codes documented in this report are preliminary and upon coder review may  be revised to meet current compliance requirements. Belvie Just, MD Belvie Just, MD 10/26/2024 12:45:17 PM This report has been signed electronically. Number of Addenda: 0

## 2024-10-27 ENCOUNTER — Other Ambulatory Visit (HOSPITAL_COMMUNITY): Payer: Self-pay

## 2024-10-27 DIAGNOSIS — R109 Unspecified abdominal pain: Secondary | ICD-10-CM | POA: Diagnosis not present

## 2024-10-27 LAB — GLUCOSE, CAPILLARY
Glucose-Capillary: 180 mg/dL — ABNORMAL HIGH (ref 70–99)
Glucose-Capillary: 126 mg/dL — ABNORMAL HIGH (ref 70–99)

## 2024-10-27 LAB — SURGICAL PATHOLOGY

## 2024-10-27 MED ORDER — CYANOCOBALAMIN 1000 MCG PO TABS
1000.0000 ug | ORAL_TABLET | Freq: Every day | ORAL | 0 refills | Status: AC
  Filled 2024-10-27: qty 30, 30d supply, fill #0

## 2024-10-27 MED ORDER — CYANOCOBALAMIN 1000 MCG PO TABS: 1000.0000 ug | ORAL_TABLET | Freq: Every day | ORAL | Status: DC

## 2024-10-27 MED ORDER — PANTOPRAZOLE SODIUM 40 MG PO TBEC
40.0000 mg | DELAYED_RELEASE_TABLET | Freq: Every day | ORAL | 1 refills | Status: AC
  Filled 2024-10-27: qty 30, 30d supply, fill #0

## 2024-10-27 NOTE — Anesthesia Postprocedure Evaluation (Signed)
 Anesthesia Post Note  Patient: Brian Dominguez  Procedure(s) Performed: COLONOSCOPY     Patient location during evaluation: PACU Anesthesia Type: MAC Level of consciousness: awake and alert Pain management: pain level controlled Vital Signs Assessment: post-procedure vital signs reviewed and stable Respiratory status: spontaneous breathing, nonlabored ventilation and respiratory function stable Cardiovascular status: stable and blood pressure returned to baseline Postop Assessment: no apparent nausea or vomiting Anesthetic complications: no   No notable events documented.                  Kenedy Haisley

## 2024-10-27 NOTE — Care Management Important Message (Signed)
 Important Message  Patient Details  Name: Brian Dominguez MRN: [id number] Date of Birth: 24-Mar-1946   Important Message Given:  Yes - Medicare IM  Im given to the patient on 10/27/2024   Claretta Deed 10/27/2024, 9:46 AM

## 2024-10-27 NOTE — Discharge Summary (Signed)
 Physician Discharge Summary  LEXIE Dominguez FMW:[id number] DOB: 05/05/1946 DOA: 10/23/2024  PCP: [facility]  Admit date: 10/23/2024 Discharge date: 10/27/2024  Admitted From:  Discharge disposition: Home   Recommendations for Outpatient Follow-Up:   Cbc/bmp next office visit Follow up B12   Discharge Diagnosis:   Active Problems:   Abdominal pain    Discharge Condition: Improved.  Diet recommendation:  Regular.  Wound care: None.  Code status: Full.   History of Present Illness:   Brian Dominguez is a 78 y.o. male with past medical history  of cholecystectomy, diabetes mellitus type 2, essential hypertension, tobacco abuse, gastritis and gastroduodenitis, has been having generalized abdominal pain for about a week or so.  Does note intermittent melena that he thinks is from Pepto-Bismol.  Patient is a limited historian otherwise.  But does report nausea and vomiting this morning.  Patient is a limited historian.    Hospital Course by Problem:   Abdominal Pain/ Mild Pancreatitis: -GI  consulted- s/p EGD -colonoscopy on 11/17 -resolved    Diabetes mellitus type 2: -resume home meds   Tobacco abuse: Nicotine  patch.   Melena/anemia: Patient states he does have intermittent melena which he attributes to his Pepto-Bismol, however in light of his ongoing anemia it is necessary for us  to any chronic GI loss.  Patient is followed closely by GI in outpatient basis as well as in-house as GI is consulted. S/p colonoscopy:  A single non-bleeding colonic angiodysplastic                            lesion. Treated with a monopolar probe.                           - Diverticulosis in the entire examined colon.   Low B12 -replete IM and then PO   Essential hypertension: -resume home meds    Medical Consultants:   GI   Discharge Exam:   Vitals:   10/27/24 0434 10/27/24 0848  BP: 135/72 128/70  Pulse: 83 83  Resp: 18 18  Temp: 98.2 F (36.8  C) 98 F (36.7 C)  SpO2: 100% 100%   Vitals:   10/26/24 1724 10/26/24 1936 10/27/24 0434 10/27/24 0848  BP: (!) 130/59 128/66 135/72 128/70  Pulse: 88 89 83 83  Resp: 18 18 18 18   Temp: 98.4 F (36.9 C) 98.3 F (36.8 C) 98.2 F (36.8 C) 98 F (36.7 C)  TempSrc: Oral Oral Oral Oral  SpO2: 100% 100% 100% 100%  Weight:      Height:        General exam: Appears calm and comfortable.     The results of significant diagnostics from this hospitalization (including imaging, microbiology, ancillary and laboratory) are listed below for reference.     Procedures and Diagnostic Studies:   MR ABDOMEN MRCP W WO CONTAST Result Date: 10/23/2024 CLINICAL DATA:  Abdominal pain with nausea and vomiting. Biliary dilatation on CT. Elevated serum lipase level with normal LFT's. EXAM: MRI ABDOMEN WITHOUT AND WITH CONTRAST (INCLUDING MRCP) TECHNIQUE: Multiplanar multisequence MR imaging of the abdomen was performed both before and after the administration of intravenous contrast. Heavily T2-weighted images of the biliary and pancreatic ducts were obtained, and three-dimensional MRCP images were rendered by post processing. CONTRAST:  10mL GADAVIST  GADOBUTROL  1 MMOL/ML IV SOLN COMPARISON:  Abdominopelvic CT 10/23/2024, 09/21/2024 and 04/21/2024. Abdominal MRI 03/28/2016.  FINDINGS: Technical note: Despite efforts by the technologist and patient, mild-to-moderate motion artifact is present on today's exam and could not be eliminated. This reduces exam sensitivity and specificity. Lower chest: Fibrotic changes are again noted at both lung bases. No definite superimposed airspace disease or significant pleural effusion. Hepatobiliary: The liver has a non cirrhotic morphology without suspicious focal abnormality. There is a 7 mm cyst in the left hepatic lobe (image 25/3). No abnormal enhancement following contrast. Status post cholecystectomy with mild intra and extrahepatic biliary dilatation as seen on earlier  CT. The common bile duct measures up to 10 mm in diameter and appears dilated to the ampulla. No evidence of choledocholithiasis or ampullary lesion. Pancreas: Mild pancreatic ductal dilatation with the pancreatic duct measuring up to 5 mm in the head of the pancreas. No focal pancreatic abnormality, surrounding inflammation or abnormal enhancement identified. Spleen: Normal in size without focal abnormality. Adrenals/Urinary Tract: Both adrenal glands appear normal. Stable small renal cysts for which no specific follow-up imaging is recommended. No evidence of enhancing renal mass or hydronephrosis. Stomach/Bowel: Surgical clips near the gastroesophageal junction with associated artifact. No evidence of bowel distension, wall thickening or surrounding inflammation. Vascular/Lymphatic: There are no enlarged abdominal or pelvic lymph nodes. Aortic and branch vessel atherosclerosis, better seen on CT. No evidence of aneurysm or large vessel occlusion. Other: No evidence of abdominal wall mass or hernia. No ascites or pneumoperitoneum. Musculoskeletal: No acute or significant osseous findings. Postsurgical changes in the lower lumbar spine with mild spondylosis. IMPRESSION: 1. No cause demonstrated for the mild intra and extrahepatic biliary dilatation and mild pancreatic ductal dilatation status post cholecystectomy. No evidence of choledocholithiasis or ampullary lesion. In the absence of elevated liver function studies, this could be physiologic or related to pancreatitis. 2. No evidence of complicated pancreatitis or other acute abdominal findings. 3. Fibrotic changes at both lung bases. Electronically Signed   By: Elsie Perone M.D.   On: 10/23/2024 13:50   MR 3D Recon At Scanner Result Date: 10/23/2024 CLINICAL DATA:  Abdominal pain with nausea and vomiting. Biliary dilatation on CT. Elevated serum lipase level with normal LFT's. EXAM: MRI ABDOMEN WITHOUT AND WITH CONTRAST (INCLUDING MRCP) TECHNIQUE:  Multiplanar multisequence MR imaging of the abdomen was performed both before and after the administration of intravenous contrast. Heavily T2-weighted images of the biliary and pancreatic ducts were obtained, and three-dimensional MRCP images were rendered by post processing. CONTRAST:  10mL GADAVIST  GADOBUTROL  1 MMOL/ML IV SOLN COMPARISON:  Abdominopelvic CT 10/23/2024, 09/21/2024 and 04/21/2024. Abdominal MRI 03/28/2016. FINDINGS: Technical note: Despite efforts by the technologist and patient, mild-to-moderate motion artifact is present on today's exam and could not be eliminated. This reduces exam sensitivity and specificity. Lower chest: Fibrotic changes are again noted at both lung bases. No definite superimposed airspace disease or significant pleural effusion. Hepatobiliary: The liver has a non cirrhotic morphology without suspicious focal abnormality. There is a 7 mm cyst in the left hepatic lobe (image 25/3). No abnormal enhancement following contrast. Status post cholecystectomy with mild intra and extrahepatic biliary dilatation as seen on earlier CT. The common bile duct measures up to 10 mm in diameter and appears dilated to the ampulla. No evidence of choledocholithiasis or ampullary lesion. Pancreas: Mild pancreatic ductal dilatation with the pancreatic duct measuring up to 5 mm in the head of the pancreas. No focal pancreatic abnormality, surrounding inflammation or abnormal enhancement identified. Spleen: Normal in size without focal abnormality. Adrenals/Urinary Tract: Both adrenal glands appear normal. Stable small renal  cysts for which no specific follow-up imaging is recommended. No evidence of enhancing renal mass or hydronephrosis. Stomach/Bowel: Surgical clips near the gastroesophageal junction with associated artifact. No evidence of bowel distension, wall thickening or surrounding inflammation. Vascular/Lymphatic: There are no enlarged abdominal or pelvic lymph nodes. Aortic and branch  vessel atherosclerosis, better seen on CT. No evidence of aneurysm or large vessel occlusion. Other: No evidence of abdominal wall mass or hernia. No ascites or pneumoperitoneum. Musculoskeletal: No acute or significant osseous findings. Postsurgical changes in the lower lumbar spine with mild spondylosis. IMPRESSION: 1. No cause demonstrated for the mild intra and extrahepatic biliary dilatation and mild pancreatic ductal dilatation status post cholecystectomy. No evidence of choledocholithiasis or ampullary lesion. In the absence of elevated liver function studies, this could be physiologic or related to pancreatitis. 2. No evidence of complicated pancreatitis or other acute abdominal findings. 3. Fibrotic changes at both lung bases. Electronically Signed   By: Elsie Perone M.D.   On: 10/23/2024 13:50   CT ABDOMEN PELVIS W CONTRAST Result Date: 10/23/2024 CLINICAL DATA:  Right lower quadrant pain. EXAM: CT ABDOMEN AND PELVIS WITH CONTRAST TECHNIQUE: Multidetector CT imaging of the abdomen and pelvis was performed using the standard protocol following bolus administration of intravenous contrast. RADIATION DOSE REDUCTION: This exam was performed according to the departmental dose-optimization program which includes automated exposure control, adjustment of the mA and/or kV according to patient size and/or use of iterative reconstruction technique. CONTRAST:  75mL OMNIPAQUE  IOHEXOL  350 MG/ML SOLN COMPARISON:  09/21/2024 FINDINGS: Lower chest: Changes of pulmonary fibrosis noted in the lung bases. Hepatobiliary: No suspicious focal abnormality within the liver parenchyma. A tiny hypodensity in the liver parenchyma is too small to characterize but is statistically most likely benign. No followup imaging is recommended. Mild intrahepatic biliary duct prominence is new in the interval. Gallbladder surgically absent. Common bile duct measures 8 mm diameter in the head of the pancreas increased from about 5 mm  previously. Pancreas: Interval development of diffuse main pancreatic duct dilatation measuring up to 6 mm diameter in the head of the pancreas. No obstructing mass lesion visible in the pancreatic head. Spleen: No splenomegaly. No suspicious focal mass lesion. Adrenals/Urinary Tract: No adrenal nodule or mass. Left kidney unremarkable. Tiny well-defined homogeneous low-density lesions in the right kidney are too small to characterize but statistically most likely benign and probably cysts. No followup imaging is recommended. No evidence for hydroureter. The urinary bladder appears normal for the degree of distention. Stomach/Bowel: Stomach is unremarkable. No gastric wall thickening. No evidence of outlet obstruction. Duodenum is normally positioned as is the ligament of Treitz. No small bowel wall thickening. No small bowel dilatation. The terminal ileum is normal. The appendix is normal. No gross colonic mass. No colonic wall thickening. Diverticular changes are noted in the left colon without evidence of diverticulitis. Large stool volume distal sigmoid colon and rectum. Vascular/Lymphatic: There is moderate atherosclerotic calcification of the abdominal aorta without aneurysm. There is no gastrohepatic or hepatoduodenal ligament lymphadenopathy. No retroperitoneal or mesenteric lymphadenopathy. No pelvic sidewall lymphadenopathy. Reproductive: Prostate gland is enlarged. Other: No intraperitoneal free fluid. Musculoskeletal: Lumbosacral fusion hardware evident. IMPRESSION: 1. Interval development of intra and extrahepatic biliary duct dilatation with diffuse dilatation of the main pancreatic duct measuring up to 6 mm diameter in the head of the pancreas. No obstructing mass lesion visible in the pancreatic head. Correlation with liver function test recommended. MRI/MRCP abdomen with and without contrast may prove helpful to further evaluate. 2. Large  stool volume distal sigmoid colon and rectum. Imaging  features could be compatible with clinical constipation. 3. Left colonic diverticulosis without diverticulitis. 4. Prostatomegaly. 5.  Aortic Atherosclerosis (ICD10-I70.0). Electronically Signed   By: Camellia Candle M.D.   On: 10/23/2024 10:05     Labs:   Basic Metabolic Panel: Recent Labs  Lab 10/23/24 0805 10/24/24 0347 10/25/24 0428 10/26/24 0614  NA 134* 134* 135 139  K 4.1 4.2 4.1 4.0  CL 98 101 101 101  CO2 26 24 24 29   GLUCOSE 140* 137* 125* 122*  BUN 18 13 7* <5*  CREATININE 1.16 1.12 1.06 0.95  CALCIUM  9.3 9.1 9.0 9.2   GFR Estimated Creatinine Clearance: 73.1 mL/min (by C-G formula based on SCr of 0.95 mg/dL). Liver Function Tests: Recent Labs  Lab 10/23/24 0805 10/24/24 0347  AST 19 22  ALT 13 17  ALKPHOS 48 49  BILITOT 0.8 0.7  PROT 7.9 7.2  ALBUMIN  3.3* 3.1*   Recent Labs  Lab 10/23/24 0805  LIPASE 274*   No results for input(s): AMMONIA in the last 168 hours. Coagulation profile No results for input(s): INR, PROTIME in the last 168 hours.  CBC: Recent Labs  Lab 10/23/24 0805 10/24/24 0347 10/25/24 0428 10/26/24 0614  WBC 5.6 4.7 6.2 4.8  HGB 11.4* 10.7* 10.8* 11.2*  HCT 35.3* 33.0* 33.6* 34.8*  MCV 78.3* 77.1* 77.4* 78.0*  PLT 365 332 309 355   Cardiac Enzymes: No results for input(s): CKTOTAL, CKMB, CKMBINDEX, TROPONINI in the last 168 hours. BNP: Invalid input(s): POCBNP CBG: Recent Labs  Lab 10/26/24 1728 10/26/24 1940 10/26/24 2006 10/27/24 0839 10/27/24 1145  GLUCAP 219* 68* 78 180* 126*   D-Dimer No results for input(s): DDIMER in the last 72 hours. Hgb A1c No results for input(s): HGBA1C in the last 72 hours. Lipid Profile No results for input(s): CHOL, HDL, LDLCALC, TRIG, CHOLHDL, LDLDIRECT in the last 72 hours. Thyroid  function studies No results for input(s): TSH, T4TOTAL, T3FREE, THYROIDAB in the last 72 hours.  Invalid input(s): FREET3 Anemia work up No results for  input(s): VITAMINB12, FOLATE, FERRITIN, TIBC, IRON, RETICCTPCT in the last 72 hours. Microbiology Recent Results (from the past 240 hours)  MRSA Next Gen by PCR, Nasal     Status: None   Collection Time: 10/23/24  3:37 PM   Specimen: Nasal Mucosa; Nasal Swab  Result Value Ref Range Status   MRSA by PCR Next Gen NOT DETECTED NOT DETECTED Final    Comment: (NOTE) The GeneXpert MRSA Assay (FDA approved for NASAL specimens only), is one component of a comprehensive MRSA colonization surveillance program. It is not intended to diagnose MRSA infection nor to guide or monitor treatment for MRSA infections. Test performance is not FDA approved in patients less than 63 years old. Performed at [facility], [address], [place], [place] [address]      Discharge Instructions:   Discharge Instructions     Diet general   Complete by: As directed    Increase activity slowly   Complete by: As directed       Allergies as of 10/27/2024       Reactions   Vasotec [enalapril] Swelling   Angioedema of the lips with        Medication List     STOP taking these medications    rosuvastatin  20 MG tablet Commonly known as: CRESTOR        TAKE these medications    aspirin  EC 81 MG tablet  Take 1 tablet (81 mg total) by mouth daily. Swallow whole.   cyanocobalamin 1000 MCG tablet Take 1 tablet (1,000 mcg total) by mouth daily. Start taking on: October 28, 2024   DULoxetine  60 MG capsule Commonly known as: CYMBALTA  Take 120 mg by mouth daily.   ferrous sulfate 324 MG Tbec Take 324 mg by mouth daily.   finasteride 5 MG tablet Commonly known as: PROSCAR Take 5 mg by mouth daily.   gabapentin  600 MG tablet Commonly known as: NEURONTIN  Take 600 mg by mouth 3 (three) times daily as needed (leg pain).   lidocaine  5 % Commonly known as: LIDODERM  Place 1 patch onto the skin daily. Remove & Discard patch within 12 hours or as directed by MD What changed:   when to take this reasons to take this additional instructions   losartan -hydrochlorothiazide  100-25 MG tablet Commonly known as: HYZAAR Take 1 tablet by mouth daily.   metFORMIN  1000 MG tablet Commonly known as: GLUCOPHAGE  TAKE 1 TABLET (1,000 MG TOTAL) BY MOUTH 2 (TWO) TIMES DAILY WITH A MEAL.   methocarbamol  500 MG tablet Commonly known as: ROBAXIN  Take 1 tablet (500 mg total) by mouth every 6 (six) hours as needed for muscle spasms.   Narcan  4 MG/0.1ML Liqd nasal spray kit Generic drug: naloxone  Place 1 spray into the nose once as needed (OD).   ondansetron  4 MG disintegrating tablet Commonly known as: ZOFRAN -ODT Take 4 mg by mouth every 8 (eight) hours as needed for nausea or vomiting.   oxyCODONE -acetaminophen  10-325 MG tablet Commonly known as: PERCOCET Take 1 tablet by mouth 4 (four) times daily as needed for pain.   pantoprazole  40 MG tablet Commonly known as: Protonix  Take 1 tablet (40 mg total) by mouth daily.   polyethylene glycol 17 g packet Commonly known as: MiraLax  Take 17 g by mouth daily. What changed:  when to take this reasons to take this   tiZANidine  4 MG tablet Commonly known as: ZANAFLEX  Take 4 mg by mouth every 8 (eight) hours as needed for muscle spasms.   Vitamin D  (Ergocalciferol ) 1.25 MG (50000 UNIT) Caps capsule Commonly known as: DRISDOL  Take 50,000 Units by mouth every Thursday.        Follow-up Information     [facility] Follow up in 1 week(s).   Why: follow up B12 levels Contact information: [address] [place] [address] [phone]                  Time coordinating discharge: 45 min  Signed:  Harlene RAYMOND Bowl DO  [facility] 10/27/2024, 1:16 PM

## 2024-10-27 NOTE — Evaluation (Signed)
 Physical Therapy Evaluation and Discharge  Patient Details Name: Brian Dominguez MRN: [id number] DOB: 1946-04-10 Today's Date: 10/27/2024  History of Present Illness  Brian Dominguez is a 78 y.o. male who has been having generalized abdominal pain for about a week or so with intermittent melena that he thinks is from Pepto-Bismol.    PMH: cholecystectomy, DMII, essential hypertension, tobacco abuse, gastritis, gastroduodenitis, s/p L5-S1posterior fusion and decompression, and hx of CVA with watershed   Clinical Impression  Pt admitted with above. Pt functioning at baseline at indep function without AD. PTA pt lives with roommate who is with patient all the time. Pt reports not using AD, drives, grocery shops, and takes self to MD appts. Pt score 22/24 on DGI indicating minimal falls risk. Pt with no further acute PT needs at this time. ACUTE PT SIGNING OFF. Please re-consult if needed in future.        If plan is discharge home, recommend the following:     Can travel by private vehicle        Equipment Recommendations None recommended by PT  Recommendations for Other Services       Functional Status Assessment Patient has had a recent decline in their functional status and demonstrates the ability to make significant improvements in function in a reasonable and predictable amount of time.     Precautions / Restrictions Precautions Precautions: Fall Restrictions Weight Bearing Restrictions Per Provider Order: No      Mobility  Bed Mobility Overal bed mobility: Modified Independent             General bed mobility comments: pulls self up using bed rail    Transfers Overall transfer level: Modified independent Equipment used: None               General transfer comment: no difficulty, steady    Ambulation/Gait Ambulation/Gait assistance: Modified independent (Device/Increase time) Gait Distance (Feet): 300 Feet Assistive device: None Gait Pattern/deviations:  Step-through pattern, Decreased stride length Gait velocity: wfl for age Gait velocity interpretation: 1.31 - 2.62 ft/sec, indicative of limited community ambulator   General Gait Details: no episode of LOB, short step length and height  Stairs            Wheelchair Mobility     Tilt Bed    Modified Rankin (Stroke Patients Only)       Balance Overall balance assessment: Mild deficits observed, not formally tested                               Standardized Balance Assessment Standardized Balance Assessment : Dynamic Gait Index   Dynamic Gait Index Level Surface: Normal Change in Gait Speed: Normal Gait with Horizontal Head Turns: Normal Gait with Vertical Head Turns: Normal Gait and Pivot Turn: Normal Step Over Obstacle: Mild Impairment Step Around Obstacles: Normal Steps: Mild Impairment Total Score: 22       Pertinent Vitals/Pain Pain Assessment Pain Assessment: Faces Faces Pain Scale: Hurts a little bit Pain Location: back, reports having back surgery recently and still going to therapy for it Pain Descriptors / Indicators: Discomfort Pain Intervention(s): Monitored during session    Home Living Family/patient expects to be discharged to:: Private residence Living Arrangements: Other (Comment) (a friend) Available Help at Discharge: Friend(s);Available PRN/intermittently Type of Home: House Home Access: Level entry       Home Layout: One level Home Equipment: None (however questionable historian, per recent admission  pt with rollator, adaptive equip and [facility])      Prior Function Prior Level of Function : Independent/Modified Independent             Mobility Comments: Ind no AD, drives, grocery shops ADLs Comments: Mod I he reports     Extremity/Trunk Assessment   Upper Extremity Assessment Upper Extremity Assessment: LUE deficits/detail LUE Deficits / Details: grossly 4-/5 compared to R UE    Lower Extremity  Assessment Lower Extremity Assessment: Generalized weakness    Cervical / Trunk Assessment Cervical / Trunk Assessment: Other exceptions (recent back surgery in June)  Communication   Communication Communication: No apparent difficulties    Cognition Arousal: Alert Behavior During Therapy: Flat affect   PT - Cognitive impairments: No family/caregiver present to determine baseline                       PT - Cognition Comments: pt questionable historian, flat affect, able to follow commands Following commands: Impaired Following commands impaired: Follows one step commands with increased time     Cueing Cueing Techniques: Verbal cues     General Comments General comments (skin integrity, edema, etc.): pt stood to urinate at commode, no physical assist needed    Exercises     Assessment/Plan    PT Assessment Patient does not need any further PT services  PT Problem List         PT Treatment Interventions      PT Goals (Current goals can be found in the Care Plan section)  Acute Rehab PT Goals Patient Stated Goal: eat lunch PT Goal Formulation: All assessment and education complete, DC therapy    Frequency       Co-evaluation               AM-PAC PT 6 Clicks Mobility  Outcome Measure Help needed turning from your back to your side while in a flat bed without using bedrails?: None Help needed moving from lying on your back to sitting on the side of a flat bed without using bedrails?: None Help needed moving to and from a bed to a chair (including a wheelchair)?: None Help needed standing up from a chair using your arms (e.g., wheelchair or bedside chair)?: None Help needed to walk in hospital room?: None Help needed climbing 3-5 steps with a railing? : A Little 6 Click Score: 23    End of Session Equipment Utilized During Treatment: Gait belt Activity Tolerance: Patient tolerated treatment well Patient left: in bed;with call bell/phone within  reach Nurse Communication: Mobility status (PT SIGNING OFF) PT Visit Diagnosis: Muscle weakness (generalized) (M62.81)    Time: 1126-1140 PT Time Calculation (min) (ACUTE ONLY): 14 min   Charges:   PT Evaluation $PT Eval Low Complexity: 1 Low   PT General Charges $$ ACUTE PT VISIT: 1 Visit         Norene Ames, PT, DPT Acute Rehabilitation Services Secure chat preferred Office #: [phone]   Norene CHRISTELLA Ames 10/27/2024, 11:53 AM

## 2024-10-27 NOTE — Plan of Care (Signed)

## 2024-10-28 ENCOUNTER — Encounter (HOSPITAL_COMMUNITY): Payer: Self-pay | Admitting: Gastroenterology

## 2024-10-28 ENCOUNTER — Other Ambulatory Visit (HOSPITAL_COMMUNITY): Payer: Self-pay

## 2024-11-18 ENCOUNTER — Other Ambulatory Visit: Payer: Self-pay | Admitting: Gastroenterology

## 2024-11-25 ENCOUNTER — Encounter (HOSPITAL_COMMUNITY): Payer: Self-pay | Admitting: Gastroenterology

## 2024-11-25 NOTE — Progress Notes (Signed)
 Attempted to obtain medical history for pre op call via telephone, unable to reach at this time. HIPAA compliant voicemail message left requesting return call to pre surgical testing department.

## 2024-12-04 ENCOUNTER — Ambulatory Visit (HOSPITAL_COMMUNITY): Admitting: Anesthesiology

## 2024-12-04 ENCOUNTER — Ambulatory Visit (HOSPITAL_COMMUNITY)
Admission: RE | Admit: 2024-12-04 | Discharge: 2024-12-04 | Disposition: A | Discharge status: Home / Self Care | Attending: Gastroenterology | Admitting: Gastroenterology

## 2024-12-04 ENCOUNTER — Encounter (HOSPITAL_COMMUNITY): Payer: Self-pay | Admitting: Gastroenterology

## 2024-12-04 ENCOUNTER — Other Ambulatory Visit: Payer: Self-pay

## 2024-12-04 ENCOUNTER — Encounter (HOSPITAL_COMMUNITY)
Admission: RE | Disposition: A | Discharge status: Home / Self Care | Payer: Self-pay | Source: Home / Self Care | Attending: Gastroenterology

## 2024-12-04 DIAGNOSIS — K219 Gastro-esophageal reflux disease without esophagitis: Secondary | ICD-10-CM | POA: Diagnosis not present

## 2024-12-04 DIAGNOSIS — M1990 Unspecified osteoarthritis, unspecified site: Secondary | ICD-10-CM | POA: Insufficient documentation

## 2024-12-04 DIAGNOSIS — K222 Esophageal obstruction: Secondary | ICD-10-CM | POA: Insufficient documentation

## 2024-12-04 DIAGNOSIS — K838 Other specified diseases of biliary tract: Secondary | ICD-10-CM | POA: Diagnosis not present

## 2024-12-04 DIAGNOSIS — Z87891 Personal history of nicotine dependence: Secondary | ICD-10-CM | POA: Insufficient documentation

## 2024-12-04 DIAGNOSIS — I10 Essential (primary) hypertension: Secondary | ICD-10-CM | POA: Insufficient documentation

## 2024-12-04 DIAGNOSIS — Z7984 Long term (current) use of oral hypoglycemic drugs: Secondary | ICD-10-CM | POA: Insufficient documentation

## 2024-12-04 DIAGNOSIS — E119 Type 2 diabetes mellitus without complications: Secondary | ICD-10-CM | POA: Insufficient documentation

## 2024-12-04 DIAGNOSIS — K8689 Other specified diseases of pancreas: Secondary | ICD-10-CM | POA: Insufficient documentation

## 2024-12-04 DIAGNOSIS — R590 Localized enlarged lymph nodes: Secondary | ICD-10-CM | POA: Insufficient documentation

## 2024-12-04 DIAGNOSIS — R1013 Epigastric pain: Secondary | ICD-10-CM | POA: Diagnosis present

## 2024-12-04 HISTORY — PX: EUS: SHX5427

## 2024-12-04 HISTORY — PX: ESOPHAGOGASTRODUODENOSCOPY: SHX5428

## 2024-12-04 LAB — GLUCOSE, CAPILLARY: Glucose-Capillary: 144 mg/dL — ABNORMAL HIGH (ref 70–99)

## 2024-12-04 SURGERY — ULTRASOUND, UPPER GI TRACT, ENDOSCOPIC
Anesthesia: Monitor Anesthesia Care

## 2024-12-04 MED ORDER — PHENYLEPHRINE 80 MCG/ML (10ML) SYRINGE FOR IV PUSH (FOR BLOOD PRESSURE SUPPORT)
PREFILLED_SYRINGE | INTRAVENOUS | Status: DC | PRN
  Administered 2024-12-04: 160 ug via INTRAVENOUS

## 2024-12-04 MED ORDER — PHENYLEPHRINE HCL (PRESSORS) 10 MG/ML IV SOLN
INTRAVENOUS | Status: AC
  Filled 2024-12-04: qty 1

## 2024-12-04 MED ORDER — PROPOFOL 1000 MG/100ML IV EMUL
INTRAVENOUS | Status: AC
  Filled 2024-12-04: qty 100

## 2024-12-04 MED ORDER — PROPOFOL 500 MG/50ML IV EMUL
INTRAVENOUS | Status: DC | PRN
  Administered 2024-12-04: 100 ug/kg/min via INTRAVENOUS
  Administered 2024-12-04: 80 mg via INTRAVENOUS
  Administered 2024-12-04: 40 mg via INTRAVENOUS

## 2024-12-04 MED ORDER — PROPOFOL 10 MG/ML IV BOLUS
INTRAVENOUS | Status: AC
  Filled 2024-12-04: qty 20

## 2024-12-04 MED ORDER — GLYCOPYRROLATE PF 0.2 MG/ML IJ SOSY
PREFILLED_SYRINGE | INTRAMUSCULAR | Status: DC | PRN
  Administered 2024-12-04: .2 mg via INTRAVENOUS

## 2024-12-04 MED ORDER — SODIUM CHLORIDE 0.9 % IV SOLN: INTRAVENOUS | Status: DC

## 2024-12-04 MED ORDER — SODIUM CHLORIDE 0.9 % IV SOLN
INTRAVENOUS | Status: AC | PRN
  Administered 2024-12-04: 500 mL via INTRAMUSCULAR

## 2024-12-04 NOTE — Transfer of Care (Signed)
 Immediate Anesthesia Transfer of Care Note  Patient: Brian DELENA Dominguez  Procedure(s) Performed: ULTRASOUND, UPPER GI TRACT, ENDOSCOPIC  Patient Location: PACU  Anesthesia Type:MAC  Level of Consciousness: awake, drowsy, patient cooperative, and responds to stimulation  Airway & Oxygen  Therapy: Patient Spontanous Breathing and Patient connected to face mask oxygen   Post-op Assessment: Report given to RN and Post -op Vital signs reviewed and stable  Post vital signs: Reviewed and stable  Last Vitals:  Vitals Value Taken Time  BP 109/50 12/04/24 08:03  Temp    Pulse 99 12/04/24 08:04  Resp 25 12/04/24 08:04  SpO2 100 % 12/04/24 08:04  Vitals shown include unfiled device data.  Last Pain:  Vitals:   12/04/24 0703  TempSrc: Temporal  PainSc: 0-No pain      Patients Stated Pain Goal: 0 (12/04/24 0703)  Complications: No notable events documented.

## 2024-12-04 NOTE — Anesthesia Preprocedure Evaluation (Signed)
"                                    Anesthesia Evaluation  Patient identified by MRN, date of birth, ID band Patient awake    Reviewed: Allergy & Precautions, NPO status , Patient's Chart, lab work & pertinent test results  History of Anesthesia Complications Negative for: history of anesthetic complications  Airway Mallampati: I  TM Distance: >3 FB Neck ROM: Full    Dental  (+) Edentulous Upper, Edentulous Lower   Pulmonary former smoker    + decreased breath sounds      Cardiovascular hypertension, Pt. on medications (-) angina  Rhythm:Regular     Neuro/Psych neg Seizures PSYCHIATRIC DISORDERS Anxiety Depression     Neuromuscular disease    GI/Hepatic Neg liver ROS, hiatal hernia,GERD  ,,epigastric pain/pancreatic duct dilation   Endo/Other  diabetes, Type 2, Oral Hypoglycemic Agents    Renal/GU Renal disease     Musculoskeletal  (+) Arthritis ,    Abdominal   Peds  Hematology negative hematology ROS (+)   Anesthesia Other Findings   Reproductive/Obstetrics                              Anesthesia Physical Anesthesia Plan  ASA: 2  Anesthesia Plan: MAC   Post-op Pain Management: Minimal or no pain anticipated   Induction: Intravenous  PONV Risk Score and Plan: 0 and Propofol  infusion  Airway Management Planned: Natural Airway and Nasal Cannula  Additional Equipment: None  Intra-op Plan:   Post-operative Plan:   Informed Consent: I have reviewed the patients History and Physical, chart, labs and discussed the procedure including the risks, benefits and alternatives for the proposed anesthesia with the patient or authorized representative who has indicated his/her understanding and acceptance.       Plan Discussed with: CRNA  Anesthesia Plan Comments:          Anesthesia Quick Evaluation  "

## 2024-12-04 NOTE — Discharge Instructions (Signed)
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
## 2024-12-04 NOTE — Op Note (Signed)
 [facility] Patient Name: Brian Dominguez Procedure Date: 12/04/2024 MRN: [id number] Attending MD: Belvie Just , MD, [id number] Date of Birth: Jan 23, 1946 CSN: [id number] Age: 78 Admit Type: Outpatient Procedure:                Upper EUS Indications:              Common bile duct dilation (acquired) seen on MRI,                            Dilated pancreatic duct on MRI Providers:                Belvie Just, MD, Gregoria Pierce, RN, Curtistine Bishop, Technician Referring MD:              Medicines:                Propofol  per Anesthesia Complications:            No immediate complications. Estimated Blood Loss:     Estimated blood loss: none. Procedure:                Pre-Anesthesia Assessment:                           - Prior to the procedure, a History and Physical                            was performed, and patient medications and                            allergies were reviewed. The patient's tolerance of                            previous anesthesia was also reviewed. The risks                            and benefits of the procedure and the sedation                            options and risks were discussed with the patient.                            All questions were answered, and informed consent                            was obtained. Prior Anticoagulants: The patient has                            taken no anticoagulant or antiplatelet agents. ASA                            Grade Assessment: III - A patient with severe  systemic disease. After reviewing the risks and                            benefits, the patient was deemed in satisfactory                            condition to undergo the procedure.                           - Sedation was administered by an anesthesia                            professional. Deep sedation was attained.                           After obtaining informed  consent, the endoscope was                            passed under direct vision. Throughout the                            procedure, the patient's blood pressure, pulse, and                            oxygen  saturations were monitored continuously. The                            [id number] ([id number] Olympus endosonoscope was                            introduced through the mouth, and advanced to the                            second part of duodenum. The upper EUS was                            accomplished without difficulty. The patient                            tolerated the procedure well. Scope In: Scope Out: Findings:      ENDOSCOPIC FINDING: :      One benign-appearing, intrinsic mild stenosis was found at the       gastroesophageal junction. The stenosis was traversed.      ENDOSONOGRAPHIC FINDING: :      There was dilation in the common bile duct and in the common hepatic       duct which measured up to 8 mm.      The diameter of the main pancreatic duct (MPD) measured:      - HOP 4 mm (head of pancreas)      - BOP 3 mm (body of the pancreas)      - TOP 2 mm (tail of the pancreas).      Two enlarged lymph nodes were visualized in the subcarinal mediastinum       (level 7) with the ultrasound probe located 30 cm from the incisors.  The       largest measured 10 mm by 30 mm in maximal cross-sectional diameter. The       nodes were oval, hypoechoic and had well defined margins.      In the subcarina two enlarged LN were identified. They were mildly       hypoechoic and the largest measured 31 mm x 10 mm. In the distal       esophagus there was a mild benign stricture. Gentle tip deflection       allowed for the echoendoscope to pass through the stenosis. The CBD and       PD clearly visualized. The CBD ranged from 5 mm up to 8 mm at the CHD.       The PD was dilated at 4 mm, 3 mm, and 2 mm in the head, body, and tail,       respectively. There was no evidence of any masses,  inflammation,       stranding, or lobularity in the pancreatic parenchyma. The Celiac axis       was normal and no LAD was noted in the abdominal region. Impression:               - Benign-appearing esophageal stenosis.                           - There was dilation in the common bile duct and in                            the common hepatic duct which measured up to 8 mm.                           - Main pancreatic duct (MPD) diameter was measured.                           - Two enlarged lymph nodes were visualized in the                            subcarinal mediastinum (level 7).                           - No specimens collected. Moderate Sedation:      Not Applicable - Patient had care per Anesthesia. Recommendation:           - Patient has a contact number available for                            emergencies. The signs and symptoms of potential                            delayed complications were discussed with the                            patient. Return to normal activities tomorrow.                            Written discharge instructions were provided to the  patient.                           - Resume regular diet.                           - Continue present medications.                           - CT scan of the chest. Procedure Code(s):        --- Professional ---                           [phone], Esophagogastroduodenoscopy, flexible,                            transoral; with endoscopic ultrasound examination,                            including the esophagus, stomach, and either the                            duodenum or a surgically altered stomach where the                            jejunum is examined distal to the anastomosis Diagnosis Code(s):        --- Professional ---                           K83.8, Other specified diseases of biliary tract                           K22.2, Esophageal obstruction                           R59.0,  Localized enlarged lymph nodes                           K86.89, Other specified diseases of pancreas CPT copyright 2022 American [employer] Association. All rights reserved. The codes documented in this report are preliminary and upon coder review may  be revised to meet current compliance requirements. Belvie Just, MD Belvie Just, MD 12/04/2024 8:14:27 AM This report has been signed electronically. Number of Addenda: 0

## 2024-12-04 NOTE — Anesthesia Postprocedure Evaluation (Signed)
"   Anesthesia Post Note  Patient: Brian Dominguez  Procedure(s) Performed: ULTRASOUND, UPPER GI TRACT, ENDOSCOPIC     Patient location during evaluation: PACU Anesthesia Type: MAC Level of consciousness: awake and alert Pain management: pain level controlled Vital Signs Assessment: post-procedure vital signs reviewed and stable Respiratory status: spontaneous breathing, nonlabored ventilation, respiratory function stable and patient connected to nasal cannula oxygen  Cardiovascular status: stable and blood pressure returned to baseline Postop Assessment: no apparent nausea or vomiting Anesthetic complications: no   No notable events documented.  Last Vitals:  Vitals:   12/04/24 0804 12/04/24 0810  BP: (!) 109/50 (!) 108/58  Pulse: 99 96  Resp: (!) 24 (!) 22  Temp:    SpO2: 100% 100%    Last Pain:  Vitals:   12/04/24 0810  TempSrc:   PainSc: 0-No pain                 Thom [name]      "

## 2024-12-04 NOTE — Anesthesia Procedure Notes (Signed)
 Procedure Name: MAC Date/Time: 12/04/2024 7:26 AM  Performed by: Nada Corean CROME, CRNAPre-anesthesia Checklist: Emergency Drugs available, Patient identified, Suction available, Patient being monitored and Timeout performed Patient Re-evaluated:Patient Re-evaluated prior to induction Oxygen  Delivery Method: Simple face mask Preoxygenation: Pre-oxygenation with 100% oxygen  Induction Type: IV induction Placement Confirmation: positive ETCO2 Dental Injury: Teeth and Oropharynx as per pre-operative assessment

## 2024-12-06 ENCOUNTER — Encounter (HOSPITAL_COMMUNITY): Payer: Self-pay | Admitting: Gastroenterology

## 2024-12-07 ENCOUNTER — Other Ambulatory Visit: Payer: Self-pay | Admitting: Gastroenterology

## 2024-12-07 DIAGNOSIS — R590 Localized enlarged lymph nodes: Secondary | ICD-10-CM

## 2024-12-14 ENCOUNTER — Inpatient Hospital Stay: Admission: RE | Admit: 2024-12-14 | Source: Ambulatory Visit

## 2024-12-14 DIAGNOSIS — R590 Localized enlarged lymph nodes: Secondary | ICD-10-CM

## 2024-12-14 MED ORDER — IOPAMIDOL (ISOVUE-300) INJECTION 61%
75.0000 mL | Freq: Once | INTRAVENOUS | Status: AC | PRN
  Administered 2024-12-14: 75 mL via INTRAVENOUS

## 2025-01-26 ENCOUNTER — Ambulatory Visit (HOSPITAL_BASED_OUTPATIENT_CLINIC_OR_DEPARTMENT_OTHER): Admitting: Pulmonary Disease

## 2025-01-28 ENCOUNTER — Ambulatory Visit (HOSPITAL_BASED_OUTPATIENT_CLINIC_OR_DEPARTMENT_OTHER): Admitting: Pulmonary Disease
